# Patient Record
Sex: Female | Born: 1985 | Race: White | Hispanic: No | Marital: Married | Smoking: Never smoker
Health system: Southern US, Community
[De-identification: ages and names within clinical notes are randomized; demographics above are authoritative.]

## PROBLEM LIST (undated history)

## (undated) ENCOUNTER — Inpatient Hospital Stay (HOSPITAL_COMMUNITY): Payer: Self-pay

## (undated) DIAGNOSIS — F329 Major depressive disorder, single episode, unspecified: Secondary | ICD-10-CM

## (undated) DIAGNOSIS — Z87448 Personal history of other diseases of urinary system: Secondary | ICD-10-CM

## (undated) DIAGNOSIS — N201 Calculus of ureter: Secondary | ICD-10-CM

## (undated) DIAGNOSIS — N1370 Vesicoureteral-reflux, unspecified: Secondary | ICD-10-CM

## (undated) DIAGNOSIS — N3010 Interstitial cystitis (chronic) without hematuria: Secondary | ICD-10-CM

## (undated) DIAGNOSIS — R300 Dysuria: Secondary | ICD-10-CM

## (undated) DIAGNOSIS — E119 Type 2 diabetes mellitus without complications: Secondary | ICD-10-CM

## (undated) DIAGNOSIS — N3941 Urge incontinence: Secondary | ICD-10-CM

## (undated) DIAGNOSIS — R3915 Urgency of urination: Secondary | ICD-10-CM

## (undated) DIAGNOSIS — N3281 Overactive bladder: Secondary | ICD-10-CM

## (undated) DIAGNOSIS — G43709 Chronic migraine without aura, not intractable, without status migrainosus: Secondary | ICD-10-CM

## (undated) DIAGNOSIS — K5900 Constipation, unspecified: Secondary | ICD-10-CM

## (undated) DIAGNOSIS — K219 Gastro-esophageal reflux disease without esophagitis: Secondary | ICD-10-CM

## (undated) DIAGNOSIS — IMO0002 Reserved for concepts with insufficient information to code with codable children: Secondary | ICD-10-CM

## (undated) DIAGNOSIS — R7303 Prediabetes: Secondary | ICD-10-CM

## (undated) DIAGNOSIS — Z87440 Personal history of urinary (tract) infections: Secondary | ICD-10-CM

## (undated) DIAGNOSIS — T8859XA Other complications of anesthesia, initial encounter: Secondary | ICD-10-CM

## (undated) DIAGNOSIS — Z87442 Personal history of urinary calculi: Secondary | ICD-10-CM

## (undated) HISTORY — DX: Gastro-esophageal reflux disease without esophagitis: K21.9

## (undated) HISTORY — DX: Reserved for concepts with insufficient information to code with codable children: IMO0002

## (undated) HISTORY — DX: Vesicoureteral-reflux, unspecified: N13.70

## (undated) HISTORY — DX: Constipation, unspecified: K59.00

## (undated) HISTORY — DX: Dysuria: R30.0

## (undated) HISTORY — DX: Overactive bladder: N32.81

## (undated) HISTORY — DX: Interstitial cystitis (chronic) without hematuria: N30.10

## (undated) HISTORY — DX: Major depressive disorder, single episode, unspecified: F32.9

## (undated) HISTORY — DX: Chronic migraine without aura, not intractable, without status migrainosus: G43.709

---
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
## 2011-06-28 NOTE — L&D Delivery Note (Addendum)
Delivery Note At 12:09 AM a viable, healthy and SGA female was delivered via Vaginal, Vacuum (Extractor) (Presentation: Middle Occiput Anterior).  3 hour second stage.  APGAR: 7, 9; weight 6 lb 0.8 oz (2744 g).   Placenta status: Intact, Spontaneous.  Cord: 3 vessels.  Anesthesia: Epidural  Episiotomy: None Lacerations: 1st degree;Vaginal Suture Repair: 3.0 chromic Est. Blood Loss (mL): 400  Mom to postpartum.  Baby to nursery-stable.  Lauren Cardenas D 06/14/2012, 12:34 AM

## 2011-10-31 ENCOUNTER — Inpatient Hospital Stay (HOSPITAL_COMMUNITY)
Admission: AD | Admit: 2011-10-31 | Discharge: 2011-10-31 | Disposition: A | Discharge status: Home / Self Care | Payer: Medicaid Other | Source: Ambulatory Visit | Attending: Obstetrics & Gynecology | Admitting: Obstetrics & Gynecology

## 2011-10-31 ENCOUNTER — Encounter (HOSPITAL_COMMUNITY): Payer: Self-pay | Admitting: *Deleted

## 2011-10-31 DIAGNOSIS — R059 Cough, unspecified: Secondary | ICD-10-CM | POA: Insufficient documentation

## 2011-10-31 DIAGNOSIS — R05 Cough: Secondary | ICD-10-CM | POA: Insufficient documentation

## 2011-10-31 DIAGNOSIS — J069 Acute upper respiratory infection, unspecified: Secondary | ICD-10-CM | POA: Insufficient documentation

## 2011-10-31 DIAGNOSIS — O99891 Other specified diseases and conditions complicating pregnancy: Secondary | ICD-10-CM | POA: Insufficient documentation

## 2011-10-31 LAB — POCT PREGNANCY, URINE: Preg Test, Ur: POSITIVE — AB

## 2011-10-31 LAB — URINALYSIS, ROUTINE W REFLEX MICROSCOPIC
Bilirubin Urine: NEGATIVE
Glucose, UA: NEGATIVE mg/dL
Hgb urine dipstick: NEGATIVE
Ketones, ur: 40 mg/dL — AB
Leukocytes, UA: NEGATIVE
Nitrite: NEGATIVE
Protein, ur: NEGATIVE mg/dL
Specific Gravity, Urine: 1.02 (ref 1.005–1.030)
Urobilinogen, UA: 1 mg/dL (ref 0.0–1.0)
pH: 8.5 — ABNORMAL HIGH (ref 5.0–8.0)

## 2011-10-31 MED ORDER — AZITHROMYCIN 250 MG PO TABS: ORAL_TABLET | ORAL | Status: AC

## 2011-10-31 NOTE — MAU Note (Signed)
Patient states she has had tow positive home pregnancy tests. Started having URI symptoms on 5-3 with a cough, congestion and sore throat that is worse at night.

## 2011-10-31 NOTE — MAU Provider Note (Signed)
History     CSN: [id number]  Arrival date and time: 10/31/11 1103   None     Chief Complaint  Patient presents with  . Cough  . Nasal Congestion   HPI 26 y.o. G1P0 at about [redacted] weeks EGA, cough, runny nose, congestion x 3 day. No fever or chills.    History reviewed. No pertinent past medical history.  Past Surgical History  Procedure Date  . No past surgeries     Family History  Problem Relation Age of Onset  . Hypertension Mother   . Diabetes Father   . Kidney disease Father     History  Substance Use Topics  . Smoking status: Never Smoker   . Smokeless tobacco: Never Used  . Alcohol Use: No    Allergies: No Known Allergies  Prescriptions prior to admission  Medication Sig Dispense Refill  . Polyethyl Glycol-Propyl Glycol (SYSTANE) 0.4-0.3 % SOLN Apply 1 drop to eye daily as needed. For dryness      . Prenatal Vit-Fe Fumarate-FA (PRENATAL MULTIVITAMIN) TABS Take 1 tablet by mouth at bedtime.        Review of Systems  Constitutional: Negative.  Negative for fever.  HENT: Positive for congestion and sore throat.   Respiratory: Positive for cough.   Cardiovascular: Negative.   Gastrointestinal: Negative for nausea, vomiting, abdominal pain, diarrhea and constipation.  Genitourinary: Negative for dysuria, urgency, frequency, hematuria and flank pain.       Negative for vaginal bleeding, vaginal discharge, dyspareunia  Musculoskeletal: Negative.   Neurological: Positive for headaches.  Psychiatric/Behavioral: Negative.    Physical Exam   Blood pressure 138/87, pulse 103, temperature 99.2 F (37.3 C), temperature source Oral, resp. rate 18, height 5' 1.5" (1.562 m), weight 168 lb 9.6 oz (76.476 kg), last menstrual period 09/09/2011, SpO2 100.00%.  Physical Exam  Nursing note and vitals reviewed. Constitutional: She is oriented to person, place, and time. She appears well-developed and well-nourished. No distress.  Cardiovascular: Normal rate, regular  rhythm and normal heart sounds.   Respiratory: Effort normal and breath sounds normal. No respiratory distress. She has no wheezes. She has no rales.  GI: Soft. There is no tenderness.  Musculoskeletal: Normal range of motion.  Neurological: She is alert and oriented to person, place, and time.  Skin: Skin is warm and dry.  Psychiatric: She has a normal mood and affect.    MAU Course  Procedures Results for orders placed during the hospital encounter of 10/31/11 (from the past 24 hour(s))  URINALYSIS, ROUTINE W REFLEX MICROSCOPIC     Status: Abnormal   Collection Time   10/31/11 11:30 AM      Component Value Range   Color, Urine YELLOW  YELLOW    APPearance HAZY (*) CLEAR    Specific Gravity, Urine 1.020  1.005 - 1.030    pH 8.5 (*) 5.0 - 8.0    Glucose, UA NEGATIVE  NEGATIVE (mg/dL)   Hgb urine dipstick NEGATIVE  NEGATIVE    Bilirubin Urine NEGATIVE  NEGATIVE    Ketones, ur 40 (*) NEGATIVE (mg/dL)   Protein, ur NEGATIVE  NEGATIVE (mg/dL)   Urobilinogen, UA 1.0  0.0 - 1.0 (mg/dL)   Nitrite NEGATIVE  NEGATIVE    Leukocytes, UA NEGATIVE  NEGATIVE   POCT PREGNANCY, URINE     Status: Abnormal   Collection Time   10/31/11 11:38 AM      Component Value Range   Preg Test, Ur POSITIVE (*) NEGATIVE  Assessment and Plan  26 y.o. G1P0 at 7.[redacted] weeks EGA URI - rev'd OTC meds that are safe in pregnancy, gave rx for Zpak - recommended to start if symptoms do not improve in 7-10 days Pregnancy verification given - start prenatal care as soon as possible  Loriene Taunton 10/31/2011, 11:59 AM

## 2011-10-31 NOTE — MAU Note (Signed)
Patient wants pregnancy verification. Has upper respiratory sxs.

## 2011-12-06 LAB — OB RESULTS CONSOLE GC/CHLAMYDIA
Chlamydia: NEGATIVE
Gonorrhea: NEGATIVE

## 2011-12-06 LAB — OB RESULTS CONSOLE ANTIBODY SCREEN: Antibody Screen: NEGATIVE

## 2012-03-20 ENCOUNTER — Inpatient Hospital Stay (HOSPITAL_COMMUNITY)
Admission: AD | Admit: 2012-03-20 | Discharge: 2012-03-20 | Disposition: A | Discharge status: Home / Self Care | Payer: Medicaid Other | Source: Ambulatory Visit | Attending: Obstetrics and Gynecology | Admitting: Obstetrics and Gynecology

## 2012-03-20 ENCOUNTER — Encounter (HOSPITAL_COMMUNITY): Payer: Self-pay | Admitting: *Deleted

## 2012-03-20 DIAGNOSIS — R55 Syncope and collapse: Secondary | ICD-10-CM | POA: Diagnosis present

## 2012-03-20 DIAGNOSIS — O2650 Maternal hypotension syndrome, unspecified trimester: Secondary | ICD-10-CM | POA: Insufficient documentation

## 2012-03-20 LAB — URINALYSIS, ROUTINE W REFLEX MICROSCOPIC
Glucose, UA: NEGATIVE mg/dL
Hgb urine dipstick: NEGATIVE
Ketones, ur: NEGATIVE mg/dL
Protein, ur: 30 mg/dL — AB

## 2012-03-20 LAB — URINE MICROSCOPIC-ADD ON

## 2012-03-20 NOTE — MAU Note (Signed)
Pt G1 at 27.4wks "passed out" about ago while in the child birth class.  Pt did not fall she was sitting in a chair.  Reports no hx of diabetes or blood pressure issues.  Last ate at 1700.

## 2012-03-20 NOTE — Progress Notes (Signed)
[name] CNM in to discuss d/c plan with pt. Written and verbal d/c instructions given and understanding voiced.

## 2012-03-20 NOTE — Progress Notes (Signed)
OK to d/c efm per Lisa Leftwich-Kirby CNM 

## 2012-03-20 NOTE — MAU Provider Note (Signed)
Chief Complaint:  Loss of Consciousness   First Provider Initiated Contact with Patient 03/20/12 2156      HPI: Lauren Cardenas is a 26 y.o. G1P0 at 50w4dwho presents to maternity admissions reporting single episode of syncope while sitting in a chair at childbirth class tonight.  She did not fall and was easily supported in chair, and quickly regained consciousness.  She has had syncopal episodes due to hypoglycemia prior to pregnancy.  The childbirth class was warm and she reports drinking a very small amount of fluids today.  She reports good fetal movement, denies contractions, LOF, vaginal bleeding, vaginal itching/burning, urinary symptoms, h/a, dizziness, n/v, or fever/chills.     Past Medical History: Past Medical History  Diagnosis Date  . Syncope 03/20/12    Past obstetric history:   Past Surgical History: Past Surgical History  Procedure Date  . No past surgeries     Family History: Family History  Problem Relation Age of Onset  . Hypertension Mother   . Diabetes Father   . Kidney disease Father     Social History: History  Substance Use Topics  . Smoking status: Never Smoker   . Smokeless tobacco: Never Used  . Alcohol Use: No    Allergies: No Known Allergies  Meds:  Prescriptions prior to admission  Medication Sig Dispense Refill  . folic acid (FOLVITE) 400 MCG tablet Take 400 mcg by mouth daily with supper.      Bertram Gala Glycol-Propyl Glycol (SYSTANE) 0.4-0.3 % SOLN Apply 1 drop to eye daily as needed. For dry eyes      . Prenatal Vit-Fe Fumarate-FA (PRENATAL MULTIVITAMIN) TABS Take 1 tablet by mouth at bedtime.        ROS: Pertinent findings in history of present illness.  Physical Exam  Blood pressure 121/71, pulse 94, temperature 99.3 F (37.4 C), temperature source Oral, resp. rate 20, height 5\' 2"  (1.575 m), weight 77.293 kg (170 lb 6.4 oz), last menstrual period 09/09/2011, SpO2 99.00%. GENERAL: Well-developed, well-nourished female in no  acute distress.  HEENT: normocephalic HEART: normal rate RESP: normal effort ABDOMEN: Soft, non-tender, gravid appropriate for gestational age EXTREMITIES: Nontender, no edema NEURO: alert and oriented     FHT:  Baseline 145 , moderate variability, accelerations present, no decelerations Contractions: None   Labs: Results for orders placed during the hospital encounter of 03/20/12 (from the past 24 hour(s))  URINALYSIS, ROUTINE W REFLEX MICROSCOPIC     Status: Abnormal   Collection Time   03/20/12  9:24 PM      Component Value Range   Color, Urine YELLOW  YELLOW   APPearance CLEAR  CLEAR   Specific Gravity, Urine 1.025  1.005 - 1.030   pH 6.5  5.0 - 8.0   Glucose, UA NEGATIVE  NEGATIVE mg/dL   Hgb urine dipstick NEGATIVE  NEGATIVE   Bilirubin Urine NEGATIVE  NEGATIVE   Ketones, ur NEGATIVE  NEGATIVE mg/dL   Protein, ur 30 (*) NEGATIVE mg/dL   Urobilinogen, UA 1.0  0.0 - 1.0 mg/dL   Nitrite NEGATIVE  NEGATIVE   Leukocytes, UA NEGATIVE  NEGATIVE  URINE MICROSCOPIC-ADD ON     Status: Abnormal   Collection Time   03/20/12  9:24 PM      Component Value Range   Squamous Epithelial / LPF FEW (*) RARE   WBC, UA 0-2  <3 WBC/hpf   Bacteria, UA FEW (*) RARE   Crystals CA OXALATE CRYSTALS (*) NEGATIVE   Urine-Other MUCOUS PRESENT  GLUCOSE, CAPILLARY     Status: Normal   Collection Time   03/20/12 10:44 PM      Component Value Range   Glucose-Capillary 93  70 - 99 mg/dL     Assessment: 1. Syncope     Plan: Called Dr Dareen Piano to discuss assessment and findings Discharge home Drink plenty of fluids F/U as scheduled on Tuesday with Dr Dareen Piano Return to MAU as needed  Follow-up Information    Follow up with HORVATH,MICHELLE A, MD. (Return to MAU as needed.)    Contact information:   [address] [place] [address] [phone]           Medication List     As of 03/20/2012 11:06 PM    TAKE these medications         folic acid 400 MCG  tablet   Commonly known as: FOLVITE   Take 400 mcg by mouth daily with supper.      prenatal multivitamin Tabs   Take 1 tablet by mouth at bedtime.      SYSTANE 0.4-0.3 % Soln   Generic drug: Polyethyl Glycol-Propyl Glycol   Apply 1 drop to eye daily as needed. For dry eyes        Sharen Counter Certified Nurse-Midwife 03/20/2012 11:06 PM

## 2012-05-15 LAB — OB RESULTS CONSOLE GBS: GBS: NEGATIVE

## 2012-06-12 ENCOUNTER — Inpatient Hospital Stay (HOSPITAL_COMMUNITY): Payer: Medicaid Other

## 2012-06-12 ENCOUNTER — Encounter (HOSPITAL_COMMUNITY): Payer: Self-pay | Admitting: *Deleted

## 2012-06-12 ENCOUNTER — Inpatient Hospital Stay (HOSPITAL_COMMUNITY)
Admission: AD | Admit: 2012-06-12 | Discharge: 2012-06-16 | DRG: 774 | Disposition: A | Discharge status: Home / Self Care | Payer: Medicaid Other | Source: Ambulatory Visit | Attending: Obstetrics and Gynecology | Admitting: Obstetrics and Gynecology

## 2012-06-12 DIAGNOSIS — IMO0002 Reserved for concepts with insufficient information to code with codable children: Principal | ICD-10-CM | POA: Diagnosis not present

## 2012-06-12 DIAGNOSIS — Z3480 Encounter for supervision of other normal pregnancy, unspecified trimester: Secondary | ICD-10-CM

## 2012-06-12 DIAGNOSIS — R55 Syncope and collapse: Secondary | ICD-10-CM

## 2012-06-12 LAB — CBC
HCT: 38.7 % (ref 36.0–46.0)
Hemoglobin: 12.8 g/dL (ref 12.0–15.0)
RBC: 4.67 MIL/uL (ref 3.87–5.11)

## 2012-06-12 MED ORDER — ONDANSETRON HCL 4 MG/2ML IJ SOLN
4.0000 mg | Freq: Four times a day (QID) | INTRAMUSCULAR | Status: DC | PRN
  Administered 2012-06-13: 4 mg via INTRAVENOUS
  Filled 2012-06-12: qty 2

## 2012-06-12 MED ORDER — ACETAMINOPHEN 325 MG PO TABS
650.0000 mg | ORAL_TABLET | ORAL | Status: DC | PRN
  Administered 2012-06-13: 325 mg via ORAL
  Filled 2012-06-12: qty 2

## 2012-06-12 MED ORDER — OXYCODONE-ACETAMINOPHEN 5-325 MG PO TABS: 1.0000 | ORAL_TABLET | ORAL | Status: DC | PRN

## 2012-06-12 MED ORDER — LIDOCAINE HCL (PF) 1 % IJ SOLN
30.0000 mL | INTRAMUSCULAR | Status: DC | PRN
  Filled 2012-06-12: qty 30

## 2012-06-12 MED ORDER — OXYTOCIN 40 UNITS IN LACTATED RINGERS INFUSION - SIMPLE MED
62.5000 mL/h | INTRAVENOUS | Status: DC
  Filled 2012-06-12 (×2): qty 1000

## 2012-06-12 MED ORDER — IBUPROFEN 600 MG PO TABS: 600.0000 mg | ORAL_TABLET | Freq: Four times a day (QID) | ORAL | Status: DC | PRN

## 2012-06-12 MED ORDER — OXYTOCIN BOLUS FROM INFUSION: 500.0000 mL | INTRAVENOUS | Status: DC

## 2012-06-12 MED ORDER — LACTATED RINGERS IV SOLN: 500.0000 mL | INTRAVENOUS | Status: DC | PRN

## 2012-06-12 MED ORDER — FLEET ENEMA 7-19 GM/118ML RE ENEM: 1.0000 | ENEMA | Freq: Every day | RECTAL | Status: DC | PRN

## 2012-06-12 MED ORDER — LACTATED RINGERS IV SOLN
INTRAVENOUS | Status: DC
  Administered 2012-06-13 (×4): via INTRAVENOUS

## 2012-06-12 MED ORDER — CITRIC ACID-SODIUM CITRATE 334-500 MG/5ML PO SOLN: 30.0000 mL | ORAL | Status: DC | PRN

## 2012-06-12 NOTE — Progress Notes (Signed)
Dr Dareen Piano called for admit orders. Informed of indeterminent baseline with minimal variability for FHR. Order to monitor for 30 minutes then call back for orders.

## 2012-06-12 NOTE — Progress Notes (Addendum)
FHR still indeterminent. Wants BPP. If 8/8 then start Cytotec if not 8/8 then call him back. Pt informed of plan

## 2012-06-13 ENCOUNTER — Encounter (HOSPITAL_COMMUNITY): Payer: Self-pay | Admitting: Anesthesiology

## 2012-06-13 ENCOUNTER — Inpatient Hospital Stay (HOSPITAL_COMMUNITY): Payer: Medicaid Other | Admitting: Anesthesiology

## 2012-06-13 LAB — CBC
HCT: 38.6 % (ref 36.0–46.0)
Hemoglobin: 12.8 g/dL (ref 12.0–15.0)
RBC: 4.68 MIL/uL (ref 3.87–5.11)

## 2012-06-13 LAB — RPR: RPR Ser Ql: NONREACTIVE

## 2012-06-13 MED ORDER — MISOPROSTOL 25 MCG QUARTER TABLET
25.0000 ug | ORAL_TABLET | ORAL | Status: DC | PRN
  Administered 2012-06-13 (×2): 25 ug via VAGINAL
  Filled 2012-06-13 (×2): qty 0.25

## 2012-06-13 MED ORDER — TERBUTALINE SULFATE 1 MG/ML IJ SOLN: 0.2500 mg | Freq: Once | INTRAMUSCULAR | Status: AC | PRN

## 2012-06-13 MED ORDER — PHENYLEPHRINE 40 MCG/ML (10ML) SYRINGE FOR IV PUSH (FOR BLOOD PRESSURE SUPPORT): 80.0000 ug | PREFILLED_SYRINGE | INTRAVENOUS | Status: DC | PRN

## 2012-06-13 MED ORDER — LACTATED RINGERS IV SOLN
500.0000 mL | Freq: Once | INTRAVENOUS | Status: AC
  Administered 2012-06-13: 15:00:00 via INTRAVENOUS

## 2012-06-13 MED ORDER — DIPHENHYDRAMINE HCL 50 MG/ML IJ SOLN: 12.5000 mg | INTRAMUSCULAR | Status: DC | PRN

## 2012-06-13 MED ORDER — EPHEDRINE 5 MG/ML INJ
INTRAVENOUS | Status: AC
  Filled 2012-06-13: qty 4

## 2012-06-13 MED ORDER — EPHEDRINE 5 MG/ML INJ: 10.0000 mg | INTRAVENOUS | Status: DC | PRN

## 2012-06-13 MED ORDER — FENTANYL 2.5 MCG/ML BUPIVACAINE 1/10 % EPIDURAL INFUSION (WH - ANES)
INTRAMUSCULAR | Status: DC | PRN
  Administered 2012-06-13: 14 mL/h via EPIDURAL

## 2012-06-13 MED ORDER — LIDOCAINE HCL (PF) 1 % IJ SOLN
INTRAMUSCULAR | Status: DC | PRN
  Administered 2012-06-13 (×2): 4 mL

## 2012-06-13 MED ORDER — OXYTOCIN 40 UNITS IN LACTATED RINGERS INFUSION - SIMPLE MED
1.0000 m[IU]/min | INTRAVENOUS | Status: DC
  Administered 2012-06-13: 2 m[IU]/min via INTRAVENOUS

## 2012-06-13 MED ORDER — FENTANYL 2.5 MCG/ML BUPIVACAINE 1/10 % EPIDURAL INFUSION (WH - ANES)
INTRAMUSCULAR | Status: AC
  Filled 2012-06-13: qty 125

## 2012-06-13 MED ORDER — FENTANYL 2.5 MCG/ML BUPIVACAINE 1/10 % EPIDURAL INFUSION (WH - ANES)
14.0000 mL/h | INTRAMUSCULAR | Status: DC
  Administered 2012-06-13: 14 mL/h via EPIDURAL
  Filled 2012-06-13: qty 125

## 2012-06-13 MED ORDER — PHENYLEPHRINE 40 MCG/ML (10ML) SYRINGE FOR IV PUSH (FOR BLOOD PRESSURE SUPPORT)
PREFILLED_SYRINGE | INTRAVENOUS | Status: AC
  Filled 2012-06-13: qty 5

## 2012-06-13 NOTE — Anesthesia Procedure Notes (Signed)
Epidural Patient location during procedure: OB Start time: 06/13/2012 2:43 PM  Staffing Anesthesiologist: Macala Baldonado A. Performed by: anesthesiologist   Preanesthetic Checklist Completed: patient identified, site marked, surgical consent, pre-op evaluation, timeout performed, IV checked, risks and benefits discussed and monitors and equipment checked  Epidural Patient position: sitting Prep: site prepped and draped and DuraPrep Patient monitoring: continuous pulse ox and blood pressure Approach: midline Injection technique: LOR air  Needle:  Needle type: Tuohy  Needle gauge: 17 G Needle length: 9 cm and 9 Needle insertion depth: 6 cm Catheter type: closed end flexible Catheter size: 19 Gauge Catheter at skin depth: 11 cm Test dose: negative and Other  Assessment Events: blood not aspirated, injection not painful, no injection resistance, negative IV test and no paresthesia  Additional Notes Patient identified. Risks and benefits discussed including failed block, incomplete  Pain control, post dural puncture headache, nerve damage, paralysis, blood pressure Changes, nausea, vomiting, reactions to medications-both toxic and allergic and post Partum back pain. All questions were answered. Patient expressed understanding and wished to proceed. Sterile technique was used throughout procedure. Epidural site was Dressed with sterile barrier dressing. No paresthesias, signs of intravascular injection Or signs of intrathecal spread were encountered.  Patient was more comfortable after the epidural was dosed. Please see RN's note for documentation of vital signs and FHR which are stable.

## 2012-06-13 NOTE — Progress Notes (Signed)
Provider reviewed strip, may restart Pitocin at 3 mu/min.

## 2012-06-13 NOTE — H&P (Signed)
Pt is a 26 year old white female, G1P0 at term who presents for induction secondary to Preeclampsia. Pt was seen this week and her B/P was elevated and she had 1+ proteinuria. Her pregnancy was otherwise uncomplicated. PMHx: see [facility] PE:[id number] HEENT- wnl ABD-gravid, non tender EXTs- wnl IMP/ IUP at term with Preeclampsia PLAN/ start induction

## 2012-06-13 NOTE — Anesthesia Preprocedure Evaluation (Signed)
Anesthesia Evaluation  Patient identified by MRN, date of birth, ID band Patient awake    Reviewed: Allergy & Precautions, H&P , Patient's Chart, lab work & pertinent test results  Airway Mallampati: III TM Distance: >3 FB Neck ROM: full    Dental No notable dental hx. (+) Teeth Intact   Pulmonary neg pulmonary ROS,  breath sounds clear to auscultation  Pulmonary exam normal       Cardiovascular negative cardio ROS  Rhythm:regular Rate:Normal     Neuro/Psych negative neurological ROS  negative psych ROS   GI/Hepatic Neg liver ROS, GERD-  Medicated and Controlled,  Endo/Other  negative endocrine ROS  Renal/GU negative Renal ROS  negative genitourinary   Musculoskeletal   Abdominal Normal abdominal exam  (+)   Peds  Hematology negative hematology ROS (+)   Anesthesia Other Findings   Reproductive/Obstetrics (+) Pregnancy                           Anesthesia Physical Anesthesia Plan  ASA: II  Anesthesia Plan: Epidural   Post-op Pain Management:    Induction:   Airway Management Planned:   Additional Equipment:   Intra-op Plan:   Post-operative Plan:   Informed Consent: I have reviewed the patients History and Physical, chart, labs and discussed the procedure including the risks, benefits and alternatives for the proposed anesthesia with the patient or authorized representative who has indicated his/her understanding and acceptance.     Plan Discussed with: Anesthesiologist  Anesthesia Plan Comments:         Anesthesia Quick Evaluation

## 2012-06-14 ENCOUNTER — Encounter (HOSPITAL_COMMUNITY): Payer: Self-pay | Admitting: *Deleted

## 2012-06-14 LAB — CBC
HCT: 34.4 % — ABNORMAL LOW (ref 36.0–46.0)
RDW: 13.5 % (ref 11.5–15.5)
WBC: 20.9 10*3/uL — ABNORMAL HIGH (ref 4.0–10.5)

## 2012-06-14 LAB — TYPE AND SCREEN
ABO/RH(D): O POS
Antibody Screen: NEGATIVE

## 2012-06-14 MED ORDER — DIBUCAINE 1 % RE OINT: 1.0000 "application " | TOPICAL_OINTMENT | RECTAL | Status: DC | PRN

## 2012-06-14 MED ORDER — OXYCODONE-ACETAMINOPHEN 5-325 MG PO TABS
1.0000 | ORAL_TABLET | ORAL | Status: DC | PRN
  Administered 2012-06-14 – 2012-06-16 (×3): 1 via ORAL
  Filled 2012-06-14 (×4): qty 1

## 2012-06-14 MED ORDER — BENZOCAINE-MENTHOL 20-0.5 % EX AERO
1.0000 "application " | INHALATION_SPRAY | CUTANEOUS | Status: DC | PRN
  Filled 2012-06-14: qty 56

## 2012-06-14 MED ORDER — ZOLPIDEM TARTRATE 5 MG PO TABS: 5.0000 mg | ORAL_TABLET | Freq: Every evening | ORAL | Status: DC | PRN

## 2012-06-14 MED ORDER — WITCH HAZEL-GLYCERIN EX PADS: 1.0000 "application " | MEDICATED_PAD | CUTANEOUS | Status: DC | PRN

## 2012-06-14 MED ORDER — SIMETHICONE 80 MG PO CHEW
80.0000 mg | CHEWABLE_TABLET | ORAL | Status: DC | PRN
  Administered 2012-06-16: 80 mg via ORAL

## 2012-06-14 MED ORDER — IBUPROFEN 600 MG PO TABS
600.0000 mg | ORAL_TABLET | Freq: Four times a day (QID) | ORAL | Status: DC
  Administered 2012-06-14 – 2012-06-16 (×10): 600 mg via ORAL
  Filled 2012-06-14 (×10): qty 1

## 2012-06-14 MED ORDER — LANOLIN HYDROUS EX OINT: TOPICAL_OINTMENT | CUTANEOUS | Status: DC | PRN

## 2012-06-14 MED ORDER — TETANUS-DIPHTH-ACELL PERTUSSIS 5-2.5-18.5 LF-MCG/0.5 IM SUSP
0.5000 mL | Freq: Once | INTRAMUSCULAR | Status: AC
  Administered 2012-06-16: 0.5 mL via INTRAMUSCULAR
  Filled 2012-06-14: qty 0.5

## 2012-06-14 MED ORDER — SENNOSIDES-DOCUSATE SODIUM 8.6-50 MG PO TABS
2.0000 | ORAL_TABLET | Freq: Every day | ORAL | Status: DC
  Administered 2012-06-14 – 2012-06-15 (×2): 2 via ORAL

## 2012-06-14 MED ORDER — DIPHENHYDRAMINE HCL 25 MG PO CAPS: 25.0000 mg | ORAL_CAPSULE | Freq: Four times a day (QID) | ORAL | Status: DC | PRN

## 2012-06-14 MED ORDER — ONDANSETRON HCL 4 MG PO TABS: 4.0000 mg | ORAL_TABLET | ORAL | Status: DC | PRN

## 2012-06-14 MED ORDER — ONDANSETRON HCL 4 MG/2ML IJ SOLN: 4.0000 mg | INTRAMUSCULAR | Status: DC | PRN

## 2012-06-14 MED ORDER — PRENATAL MULTIVITAMIN CH
1.0000 | ORAL_TABLET | Freq: Every day | ORAL | Status: DC
  Administered 2012-06-14 – 2012-06-16 (×3): 1 via ORAL
  Filled 2012-06-14 (×3): qty 1

## 2012-06-14 MED ORDER — INFLUENZA VIRUS VACC SPLIT PF IM SUSP
0.5000 mL | INTRAMUSCULAR | Status: AC
  Administered 2012-06-14: 0.5 mL via INTRAMUSCULAR
  Filled 2012-06-14: qty 0.5

## 2012-06-14 NOTE — Anesthesia Postprocedure Evaluation (Signed)
  Anesthesia Post-op Note  Patient: Lauren Cardenas  Procedure(s) Performed: * No procedures listed *  Patient Location: Mother/Baby  Anesthesia Type:Epidural  Level of Consciousness: awake, alert  and oriented  Airway and Oxygen Therapy: Patient Spontanous Breathing  Post-op Pain: mild  Post-op Assessment: Post-op Vital signs reviewed, Patient's Cardiovascular Status Stable, No headache, No backache, No residual numbness and No residual motor weakness  Post-op Vital Signs: Reviewed and stable  Complications: No apparent anesthesia complications

## 2012-06-14 NOTE — Progress Notes (Signed)
Patient is eating, ambulating, voiding.  Pain control is good.  Filed Vitals:   06/14/12 0116 06/14/12 0220 06/14/12 0320 06/14/12 0720  BP: 126/66 117/50 129/78 122/75  Pulse: 98 88 86 90  Temp:  99.3 F (37.4 C) 98.5 F (36.9 C) 98.9 F (37.2 C)  TempSrc:  Oral Oral Oral  Resp: 20 20 20 20   Height:      Weight:      SpO2:  99% 97% 98%    Fundus firm Perineum without swelling.  Lab Results  Component Value Date   WBC 20.9* 06/14/2012   HGB 11.2* 06/14/2012   HCT 34.4* 06/14/2012   MCV 82.9 06/14/2012   PLT 139* 06/14/2012    --/--/O POS, O POS (12/17 2200)/RI  A/P Post partum day 0.  Routine care.  Expect d/c 1-2 days.    Talaysia Pinheiro A

## 2012-06-15 NOTE — Progress Notes (Signed)
PPD#1 Pt without complaints. VSSAF IMP/ doing well  Plan/ routine care. 

## 2012-06-16 MED ORDER — IBUPROFEN 600 MG PO TABS: 600.0000 mg | ORAL_TABLET | Freq: Four times a day (QID) | ORAL | Status: DC

## 2012-06-16 NOTE — Discharge Summary (Signed)
Obstetric Discharge Summary Reason for Admission: induction of labor Prenatal Procedures: ultrasound Intrapartum Procedures: vacuum Postpartum Procedures: none Complications-Operative and Postpartum: 1 degree perineal laceration Hemoglobin  Date Value Range Status  06/14/2012 11.2* 12.0 - 15.0 g/dL Final     HCT  Date Value Range Status  06/14/2012 34.4* 36.0 - 46.0 % Final    Physical Exam:  General: alert Lochia: appropriate Uterine Fundus: firm }  Discharge Diagnoses: Term Pregnancy-delivered and Preelampsia  Discharge Information: Date: 06/16/2012 Activity: pelvic rest Diet: routine Medications: PNV and Ibuprofen Condition: stable Instructions: refer to practice specific booklet Discharge to: home Follow-up Information    Follow up with Mickel Baas, MD. Schedule an appointment as soon as possible for a visit in 4 weeks.   Contact information:   [address] [place] [address] [phone]          Newborn Data: Live born female  Birth Weight: 6 lb 0.8 oz (2744 g) APGAR: 7, 9  Home with mother.  Lauren Cardenas 06/16/2012, 4:14 PM

## 2012-06-21 ENCOUNTER — Telehealth (HOSPITAL_COMMUNITY): Payer: Self-pay | Admitting: *Deleted

## 2012-06-21 NOTE — Telephone Encounter (Signed)
Resolve episode 

## 2012-11-13 ENCOUNTER — Encounter: Payer: Self-pay | Admitting: Cardiovascular Disease

## 2012-11-13 ENCOUNTER — Telehealth: Payer: Self-pay | Admitting: Cardiovascular Disease

## 2012-11-13 NOTE — Telephone Encounter (Signed)
Error - wrong pt

## 2012-11-13 NOTE — Telephone Encounter (Signed)
error 

## 2013-05-24 ENCOUNTER — Ambulatory Visit: Payer: Self-pay | Admitting: General Practice

## 2013-05-24 ENCOUNTER — Ambulatory Visit (INDEPENDENT_AMBULATORY_CARE_PROVIDER_SITE_OTHER): Payer: Medicaid Other | Admitting: General Practice

## 2013-05-24 ENCOUNTER — Encounter: Payer: Self-pay | Admitting: General Practice

## 2013-05-24 VITALS — BP 131/90 | HR 91 | Temp 100.7°F | Ht 62.0 in | Wt 182.0 lb

## 2013-05-24 DIAGNOSIS — R509 Fever, unspecified: Secondary | ICD-10-CM

## 2013-05-24 DIAGNOSIS — J069 Acute upper respiratory infection, unspecified: Secondary | ICD-10-CM

## 2013-05-24 LAB — POCT INFLUENZA A/B
Influenza A, POC: NEGATIVE
Influenza B, POC: NEGATIVE

## 2013-05-24 LAB — POCT RAPID STREP A (OFFICE): Rapid Strep A Screen: NEGATIVE

## 2013-05-24 MED ORDER — AMOXICILLIN 500 MG PO CAPS: 500.0000 mg | ORAL_CAPSULE | Freq: Two times a day (BID) | ORAL | Status: DC

## 2013-05-24 NOTE — Progress Notes (Signed)
   Subjective:    Patient ID: Lauren Cardenas, female    DOB: 02-17-1986, 27 y.o.   MRN: [id number]  Sore Throat  This is a new problem. The current episode started in the past 7 days. The problem has been gradually worsening. Neither side of throat is experiencing more pain than the other. The maximum temperature recorded prior to her arrival was 100 - 100.9 F. The fever has been present for 1 to 2 days. The pain is at a severity of 5/10. Associated symptoms include congestion and a plugged ear sensation. Pertinent negatives include no abdominal pain, headaches, shortness of breath or vomiting. She has had no exposure to strep or mono. She has tried cool liquids for the symptoms. The treatment provided no relief.      Review of Systems  Constitutional: Negative for fever and chills.  HENT: Positive for congestion, postnasal drip, sinus pressure and sore throat.   Respiratory: Positive for wheezing. Negative for chest tightness and shortness of breath.   Cardiovascular: Negative for chest pain and palpitations.  Gastrointestinal: Positive for nausea. Negative for vomiting and abdominal pain.  Genitourinary: Negative for difficulty urinating.  Neurological: Negative for dizziness, weakness and headaches.       Objective:   Physical Exam  Constitutional: She is oriented to person, place, and time. She appears well-developed and well-nourished.  HENT:  Head: Normocephalic and atraumatic.  Right Ear: External ear normal.  Left Ear: External ear normal.  Mouth/Throat: Posterior oropharyngeal erythema present.  Eyes: Pupils are equal, round, and reactive to light.  Neck: Normal range of motion. Neck supple. No thyromegaly present.  Cardiovascular: Normal rate, regular rhythm and normal heart sounds.   Pulmonary/Chest: Effort normal and breath sounds normal. No respiratory distress. She exhibits no tenderness.  Abdominal: Soft. Bowel sounds are normal. She exhibits no distension. There is  no tenderness.  Lymphadenopathy:    She has no cervical adenopathy.  Neurological: She is alert and oriented to person, place, and time.  Skin: Skin is warm and dry.  Psychiatric: She has a normal mood and affect.   Results for orders placed in visit on 05/24/13  POCT INFLUENZA A/B      Result Value Range   Influenza A, POC Negative     Influenza B, POC Negative    POCT RAPID STREP A (OFFICE)      Result Value Range   Rapid Strep A Screen Negative  Negative         Assessment & Plan:  1. Fever, unspecified  - POCT Influenza A/B - POCT rapid strep A  2. Upper respiratory infection  - amoxicillin (AMOXIL) 500 MG capsule; Take 1 capsule (500 mg total) by mouth 2 (two) times daily.  Dispense: 20 capsule; Refill: 0 -Increase fluid intake Motrin or tylenol OTC OTC decongestant Proper handwashing Patient verbalized understanding Coralie Keens, FNP-C

## 2013-05-24 NOTE — Patient Instructions (Signed)

## 2013-06-03 ENCOUNTER — Ambulatory Visit (INDEPENDENT_AMBULATORY_CARE_PROVIDER_SITE_OTHER): Payer: Medicaid Other | Admitting: Family Medicine

## 2013-06-03 ENCOUNTER — Encounter: Payer: Self-pay | Admitting: Family Medicine

## 2013-06-03 VITALS — BP 131/90 | HR 101 | Temp 99.2°F | Ht 62.0 in | Wt 183.0 lb

## 2013-06-03 DIAGNOSIS — L282 Other prurigo: Secondary | ICD-10-CM

## 2013-06-03 DIAGNOSIS — T7840XA Allergy, unspecified, initial encounter: Secondary | ICD-10-CM

## 2013-06-03 MED ORDER — PREDNISONE 50 MG PO TABS: ORAL_TABLET | ORAL | Status: DC

## 2013-06-03 MED ORDER — SODIUM CHLORIDE 0.9 % IV SOLN: 125.0000 mg | Freq: Once | INTRAVENOUS | Status: DC

## 2013-06-03 NOTE — Progress Notes (Signed)
   Subjective:    Patient ID: Lauren Cardenas, female    DOB: April 08, 1986, 27 y.o.   MRN: [id number]  HPI HPI  This patient complains of a RASH  Location: generalized, mainly upper body   Onset: 2-3 days   Course: initially noticed mild pruritic rash on upper extremities bilaterally. Gradually progressed to involve chest, neck, back. Recently was on course of amox for URI (rash and abx overlapped by approx 2 days)  Self-treated with: nothing   Improvement with treatment: nothing   History  Itching: yes  Tenderness: no  New medications/antibiotics: no  Pet exposure: no  Recent travel or tropical exposure: no  New soaps, shampoos, detergent, clothing: no  Tick/insect exposure: no  Chemical Exposure: no  Red Flags  Feeling ill: no  Fever: no  Facial/tongue swelling/difficulty breathing: no  Diabetic or immunocompromised: no     Review of Systems  All other systems reviewed and are negative.       Objective:   Physical Exam  Constitutional: She appears well-developed and well-nourished.  HENT:  Head: Normocephalic and atraumatic.  Eyes: Conjunctivae are normal. Pupils are equal, round, and reactive to light.  Neck: Normal range of motion. Neck supple.  Cardiovascular: Normal rate and regular rhythm.   Pulmonary/Chest: Effort normal and breath sounds normal.  Abdominal: Soft.  Neurological: She is alert.  Skin: Rash noted.  Faint papular, mildly erythematous rash diffusely across chest, neck, back and arms.  Nontender          Assessment & Plan:  Pruritic rash - Plan: methylPREDNISolone sodium succinate (SOLU-MEDROL) 130 mg in sodium chloride 0.9 % 50 mL IVPB, predniSONE (DELTASONE) 50 MG tablet  Allergic reaction, initial encounter - Plan: methylPREDNISolone sodium succinate (SOLU-MEDROL) 130 mg in sodium chloride 0.9 % 50 mL IVPB, predniSONE (DELTASONE) 50 MG tablet  Suspect mild allergic reaction to amox (fairly common offending agent) vs. Post viral exanthem  (lower on ddx).  Will treat with solumedrol 125mg  IM x1 Prn prednisone at home.  Discussed general and derm red flags.  Will add amox to allergy list.  Follow up as needed.

## 2013-06-04 ENCOUNTER — Encounter: Payer: Self-pay | Admitting: Nurse Practitioner

## 2013-06-04 ENCOUNTER — Ambulatory Visit (INDEPENDENT_AMBULATORY_CARE_PROVIDER_SITE_OTHER): Payer: Medicaid Other | Admitting: Nurse Practitioner

## 2013-06-04 ENCOUNTER — Other Ambulatory Visit: Payer: Medicaid Other

## 2013-06-04 ENCOUNTER — Ambulatory Visit (INDEPENDENT_AMBULATORY_CARE_PROVIDER_SITE_OTHER): Payer: Medicaid Other

## 2013-06-04 VITALS — BP 153/84 | HR 109 | Temp 98.4°F | Ht 62.0 in | Wt 183.0 lb

## 2013-06-04 DIAGNOSIS — Z5189 Encounter for other specified aftercare: Secondary | ICD-10-CM

## 2013-06-04 DIAGNOSIS — S161XXD Strain of muscle, fascia and tendon at neck level, subsequent encounter: Secondary | ICD-10-CM

## 2013-06-04 DIAGNOSIS — M25519 Pain in unspecified shoulder: Secondary | ICD-10-CM

## 2013-06-04 DIAGNOSIS — M25511 Pain in right shoulder: Secondary | ICD-10-CM

## 2013-06-04 DIAGNOSIS — M542 Cervicalgia: Secondary | ICD-10-CM

## 2013-06-04 MED ORDER — METHYLPREDNISOLONE SODIUM SUCC 125 MG IJ SOLR: 125.0000 mg | Freq: Once | INTRAMUSCULAR | Status: DC

## 2013-06-04 MED ORDER — CYCLOBENZAPRINE HCL 10 MG PO TABS: 10.0000 mg | ORAL_TABLET | Freq: Three times a day (TID) | ORAL | Status: DC | PRN

## 2013-06-04 MED ORDER — METHYLPREDNISOLONE SODIUM SUCC 40 MG IJ SOLR
80.0000 mg | Freq: Once | INTRAMUSCULAR | Status: AC
  Administered 2013-06-03: 80 mg via INTRAMUSCULAR

## 2013-06-04 NOTE — Progress Notes (Signed)
   Subjective:    Patient ID: Lauren Cardenas, female    DOB: 06/22/1986, 27 y.o.   MRN: [id number]  HPI Patient in c/o right neck and shoulder pain- started about 4-5 weeks ago- denies any injury- rates pain 5-8 . Was seen lat night and was given a steroid shot and currently has no pain. SHe said pain radiates just a little down right arm. Nothing seems to make it worse but ibuprofen makes it better.    Review of Systems  Constitutional: Negative.   Respiratory: Negative.   Cardiovascular: Negative.   Neurological: Negative for tremors, weakness and numbness.       Objective:   Physical Exam  Constitutional: She is oriented to person, place, and time. She appears well-developed and well-nourished.  Cardiovascular: Normal rate, regular rhythm and normal heart sounds.   Pulmonary/Chest: Effort normal and breath sounds normal.  Musculoskeletal:  FROM of neck without pain. FROM of right shoulder without pain or tenderness Grips equal bilaterally Motor strength and sensation distally intact  Neurological: She is alert and oriented to person, place, and time. She has normal reflexes.   BP 153/84  Pulse 109  Temp(Src) 98.4 F (36.9 C) (Oral)  Ht 5\' 2"  (1.575 m)  Wt 183 lb (83.008 kg)  BMI 33.46 kg/m2 Xray cervical spine- slight reversal of normal curvature of cervical spine- Preliminary reading by Paulene Floor, FNP  [facility] Shoulder x ray - normal-Preliminary reading by Paulene Floor, FNP  [facility]        Assessment & Plan:   1. Neck pain   2. Right shoulder pain   3. Neck strain, subsequent encounter    Meds ordered this encounter  Medications  . cyclobenzaprine (FLEXERIL) 10 MG tablet    Sig: Take 1 tablet (10 mg total) by mouth 3 (three) times daily as needed for muscle spasms.    Dispense:  30 tablet    Refill:  0    Order Specific Question:  Supervising Provider    Answer:  Ernestina Penna [[id number]]    Moist heat Neck stretches RTO prn  Lauren Daphine Deutscher, FNP

## 2013-06-04 NOTE — Addendum Note (Signed)
Addended by: Gwenith Daily on: 06/04/2013 09:41 AM   Modules accepted: Orders

## 2013-06-04 NOTE — Patient Instructions (Signed)

## 2014-03-27 HISTORY — PX: WISDOM TOOTH EXTRACTION: SHX21

## 2014-04-28 ENCOUNTER — Encounter: Payer: Self-pay | Admitting: Nurse Practitioner

## 2014-06-12 ENCOUNTER — Ambulatory Visit (INDEPENDENT_AMBULATORY_CARE_PROVIDER_SITE_OTHER): Payer: Medicaid Other | Admitting: Nurse Practitioner

## 2014-06-12 ENCOUNTER — Encounter: Payer: Self-pay | Admitting: Nurse Practitioner

## 2014-06-12 VITALS — BP 138/82 | HR 88 | Temp 98.2°F | Ht 61.0 in | Wt 180.0 lb

## 2014-06-12 DIAGNOSIS — R300 Dysuria: Secondary | ICD-10-CM

## 2014-06-12 DIAGNOSIS — R350 Frequency of micturition: Secondary | ICD-10-CM

## 2014-06-12 LAB — POCT UA - MICROSCOPIC ONLY
BACTERIA, U MICROSCOPIC: NEGATIVE
CRYSTALS, UR, HPF, POC: NEGATIVE
Casts, Ur, LPF, POC: NEGATIVE
Mucus, UA: NEGATIVE
WBC, UR, HPF, POC: NEGATIVE
Yeast, UA: NEGATIVE

## 2014-06-12 LAB — POCT URINALYSIS DIPSTICK
BILIRUBIN UA: NEGATIVE
GLUCOSE UA: NEGATIVE
Ketones, UA: NEGATIVE
Leukocytes, UA: NEGATIVE
Nitrite, UA: NEGATIVE
Protein, UA: NEGATIVE
SPEC GRAV UA: 1.025
Urobilinogen, UA: NEGATIVE
pH, UA: 6.5

## 2014-06-12 MED ORDER — NITROFURANTOIN MONOHYD MACRO 100 MG PO CAPS: 100.0000 mg | ORAL_CAPSULE | Freq: Two times a day (BID) | ORAL | Status: DC

## 2014-06-12 NOTE — Progress Notes (Signed)
   Subjective:    Patient ID: Lauren Cardenas, female    DOB: [birth year], 28 y.o.   MRN: [id number]  HPI Patient in c/o dysuira- went to urgent on December 11,2015 and was diagnosed with UTI- was given bactrim BId- yesterday she started having worsening bladder pain and is still having constant feeling that she needs to pee.  She is also c/o sinus congestion.  Review of Systems  Constitutional: Negative for fever, chills and appetite change.  HENT: Positive for congestion and rhinorrhea.   Respiratory: Negative for cough.   Genitourinary: Positive for dysuria and pelvic pain.  Neurological: Negative.   Psychiatric/Behavioral: Negative.   All other systems reviewed and are negative.      Objective:   Physical Exam  Constitutional: She is oriented to person, place, and time. She appears well-developed and well-nourished. No distress.  HENT:  Right Ear: External ear normal.  Left Ear: External ear normal.  Nose: Nose normal.  Mouth/Throat: Oropharynx is clear and moist.  Neck: Normal range of motion.  Cardiovascular: Normal rate, regular rhythm and normal heart sounds.   Pulmonary/Chest: Effort normal and breath sounds normal.  Abdominal: Soft. Bowel sounds are normal. There is tenderness (mild suprapubic pain on palaption).  Genitourinary:  No CVA tenderness  Lymphadenopathy:    She has no cervical adenopathy.  Neurological: She is alert and oriented to person, place, and time.  Skin: Skin is warm and dry.  Psychiatric: She has a normal mood and affect. Her behavior is normal. Judgment and thought content normal.   BP 138/82 mmHg  Pulse 88  Temp(Src) 98.2 F (36.8 C) (Oral)  Ht 5\' 1"  (1.549 m)  Wt 180 lb (81.647 kg)  BMI 34.03 kg/m2         Assessment & Plan:   1. Frequent urination   2. Dysuria    Meds ordered this encounter  Medications  . nitrofurantoin, macrocrystal-monohydrate, (MACROBID) 100 MG capsule    Sig: Take 1 capsule (100 mg total) by mouth 2  (two) times daily.    Dispense:  14 capsule    Refill:  0    Order Specific Question:  Supervising Provider    Answer:  Ernestina PennaMOORE, DONALD W [[id number]   Stop bactrim- changed to macrobid Force fluids Urostat as needed RTO prn  Lauren Daphine DeutscherMartin, FNP

## 2014-06-12 NOTE — Patient Instructions (Signed)

## 2014-06-18 ENCOUNTER — Telehealth: Payer: Self-pay | Admitting: Nurse Practitioner

## 2014-06-18 DIAGNOSIS — R300 Dysuria: Secondary | ICD-10-CM

## 2014-06-18 NOTE — Telephone Encounter (Signed)
Please review and advise.

## 2014-06-19 NOTE — Telephone Encounter (Signed)
Referral sent to urology.

## 2014-07-28 DIAGNOSIS — Z87442 Personal history of urinary calculi: Secondary | ICD-10-CM | POA: Insufficient documentation

## 2014-09-04 ENCOUNTER — Other Ambulatory Visit: Payer: Self-pay | Admitting: Urology

## 2014-09-05 ENCOUNTER — Encounter (HOSPITAL_COMMUNITY): Payer: Self-pay

## 2014-09-05 NOTE — Patient Instructions (Addendum)
Lauren CurlingRachael D Cardenas  09/05/2014   Your procedure is scheduled on: Tuesday 09/09/14  [facility] and follow signs to               Short Stay Center at 08:00 AM.  Call this number if you have problems the morning of surgery [phone]   Remember:  Do not eat food or drink liquids :After Midnight.               You may not have any metal on your body including hair pins and              piercings  Do not wear jewelry, make-up, lotions, powders or perfumes.             Do not wear nail polish.  Do not shave  48 hours prior to surgery.              Men may shave face and neck.  Do not bring valuables to the hospital. [facility] IS NOT             RESPONSIBLE   FOR VALUABLES.  Contacts, dentures or bridgework may not be worn into surgery.   Patients discharged the day of surgery will not be allowed to drive home.  Name and phone number of your driver: Heloise OchoaJoey Cheaney [phone]   _____________________________________________________________________             [facility] - Preparing for Surgery Before surgery, you can play an important role.  Because skin is not sterile, your skin needs to be as free of germs as possible.  You can reduce the number of germs on your skin by washing with CHG (chlorahexidine gluconate) soap before surgery.  CHG is an antiseptic cleaner which kills germs and bonds with the skin to continue killing germs even after washing. Please DO NOT use if you have an allergy to CHG or antibacterial soaps.  If your skin becomes reddened/irritated stop using the CHG and inform your nurse when you arrive at Short Stay. Do not shave (including legs and underarms) for at least 48 hours prior to the first CHG shower.  You may shave your face/neck. Please follow these instructions carefully:  1.  Shower with CHG Soap the night before surgery and the  morning of Surgery.  2.  If you choose to wash your hair, wash your hair first as  usual with your  normal  shampoo.  3.  After you shampoo, rinse your hair and body thoroughly to remove the  shampoo.                            4.  Use CHG as you would any other liquid soap.  You can apply chg directly  to the skin and wash                       Gently with a scrungie or clean washcloth.  5.  Apply the CHG Soap to your body ONLY FROM THE NECK DOWN.   Do not use on face/ open                           Wound or open sores. Avoid contact with eyes, ears mouth and genitals (private parts).  Wash face,  Genitals (private parts) with your normal soap.             6.  Wash thoroughly, paying special attention to the area where your surgery  will be performed.  7.  Thoroughly rinse your body with warm water from the neck down.  8.  DO NOT shower/wash with your normal soap after using and rinsing off  the CHG Soap.                9.  Pat yourself dry with a clean towel.            10.  Wear clean pajamas.            11.  Place clean sheets on your bed the night of your first shower and do not  sleep with pets. Day of Surgery : Do not apply any lotions/deodorants the morning of surgery.  Please wear clean clothes to the hospital/surgery center.  FAILURE TO FOLLOW THESE INSTRUCTIONS MAY RESULT IN THE CANCELLATION OF YOUR SURGERY PATIENT SIGNATURE_________________________________  NURSE SIGNATURE__________________________________  ________________________________________________________________________

## 2014-09-08 ENCOUNTER — Encounter (HOSPITAL_COMMUNITY)
Admission: RE | Admit: 2014-09-08 | Discharge: 2014-09-08 | Disposition: A | Discharge status: Home / Self Care | Payer: Medicaid Other | Source: Ambulatory Visit | Attending: Urology | Admitting: Urology

## 2014-09-08 ENCOUNTER — Encounter (HOSPITAL_COMMUNITY): Payer: Self-pay

## 2014-09-08 DIAGNOSIS — N201 Calculus of ureter: Secondary | ICD-10-CM | POA: Diagnosis not present

## 2014-09-08 HISTORY — DX: Calculus of ureter: N20.1

## 2014-09-08 LAB — CBC
HEMATOCRIT: 40.3 % (ref 36.0–46.0)
HEMOGLOBIN: 12.6 g/dL (ref 12.0–15.0)
MCH: 25.6 pg — AB (ref 26.0–34.0)
MCHC: 31.3 g/dL (ref 30.0–36.0)
MCV: 81.9 fL (ref 78.0–100.0)
Platelets: 268 10*3/uL (ref 150–400)
RBC: 4.92 MIL/uL (ref 3.87–5.11)
RDW: 13.6 % (ref 11.5–15.5)
WBC: 14.8 10*3/uL — ABNORMAL HIGH (ref 4.0–10.5)

## 2014-09-08 LAB — HCG, SERUM, QUALITATIVE: Preg, Serum: NEGATIVE

## 2014-09-08 NOTE — H&P (Signed)
History of Present Illness   29 YO female patient of Dr. Estil DaftEskridge's seen 3 weeks ago as a work-in for acute on-set left flan pain.     GU Hx:    1- MH associated with dysuria, bladder pain and frequency - Jan 2016 - sp or bladder pain, left flank pain; mild frequency urgency and dysuria. U/A dip negative, but micro showed 25-30 rbc/hpf. No gross hematuria. She has no history of kidney stones. She has no exposure risk. She has no neurogenic risks.    January 2016 interval history  Patient returns for continued evaluation as a microscopic hematuria and lower urinary tract symptoms. She underwent a CT hematuria protocol which revealed a 4 mm left distal stone, minimal hydro-. Bilateral kidney stones which may be in the parenchyma or she may have some scarring from prior reflux. Reviewed all the images.     After the CT scan her symptoms resolve. She hasn't been straining her urine and therefore hasn't seen a stone pass but remains asymptomatic today. She's had no dysuria or gross hematuria and no frequency, no urgency, no flank pain.     Feb 2016 Interval Hx:   States that she never passed a stone that she is aware of; had been pain free until this am. Now with left flank pain, mild nausea, and diaphoresis. Denies f/c or vomiting.     March 2016 Interval Hx:   States that she continues to have intermittent pain which Tramadol did not control but she had some Hydrocodone from dental work which is controlling the pain. Denies passing any stone material. Denies f/c or hematuria.   Vitals Vital Signs [Data Includes: Last 1 Day]  Recorded: 08Mar2016 03:24PM  Blood Pressure: 144 / 92 Temperature: 98.4 F Heart Rate: 99  Physical Exam Constitutional: Well nourished and well developed . No acute distress. The patient appears well hydrated.  Abdomen: The abdomen is mildly obese. The abdomen is soft and nontender. Tenderness in the LLQ is present, but no suprapubic tenderness. mild left  CVA tenderness.    Results/Data Urine [Data Includes: Last 1 Day]   08Mar2016  COLOR YELLOW   APPEARANCE CLOUDY   SPECIFIC GRAVITY 1.020   pH 7.5   GLUCOSE NEG mg/dL  BILIRUBIN NEG   KETONE NEG mg/dL  BLOOD NEG   PROTEIN TRACE mg/dL  UROBILINOGEN 0.2 mg/dL  NITRITE NEG   LEUKOCYTE ESTERASE NEG   SQUAMOUS EPITHELIAL/HPF RARE   WBC 0-2 WBC/hpf  RBC 0-2 RBC/hpf  BACTERIA RARE   CRYSTALS NONE SEEN   CASTS NONE SEEN   Other AMORPHOUS    The following images/tracing/specimen were independently visualized:  CT urogram shows a slightly dilated left ureter to level of distal 4 mm UVJ calculus.  The following clinical lab reports were reviewed:  UA- negative. Selected Results  AU CT-STONE PROTOCOL 08Mar2016 12:00AM Jetta LoutWarden, Diane   Test Name Result Flag Reference  CT-STONE PROTOCOL (Report)    ** RADIOLOGY REPORT BY [facility] **   CLINICAL DATA: Left lower quadrant pain for the past 4 months. History of left ureteral stone.  EXAM: CT ABDOMEN AND PELVIS WITHOUT CONTRAST  TECHNIQUE: Multidetector CT imaging of the abdomen and pelvis was performed following the standard protocol without IV contrast.  COMPARISON: 07/14/2014  FINDINGS: Lower chest: Clear lung bases. No pleural fluid.  Liver and stomach partially excluded.  Hepatobiliary: Normal liver. Normal gallbladder, without biliary ductal dilatation.  Pancreas: Normal, without mass or ductal dilatation.  Spleen: Normal  Adrenals/Urinary Tract:  Normal adrenal glands. bilateral renal cortical scarring and collecting system stones again identified. The largest right-sided stone measures 5 mm. The largest left-sided stones measure 4 mm.  The punctate right distal ureteric stone questioned on the prior exam is no longer identified and may have passed. The distal left ureteric stone is similar in position, approximately 1 cm above the ureterovesicular junction. 4 x 5 mm on image 70. Minimal  similar proximal left hydroureter. No bladder calculi.  Stomach/Bowel: Normal stomach, without wall thickening. Normal terminal ileum and appendix. Normal small bowel.  Vascular/Lymphatic: Normal caliber of the aorta and branch vessels. No abdominopelvic adenopathy.  Reproductive: Normal uterus and adnexa.  Other: No significant free fluid.  Musculoskeletal: No acute osseous abnormality.  IMPRESSION: 1. Similar position of a distal left ureteric stone with minimal proximal left hydroureter. 2. The questioned distal right ureteric stone on the prior exam is no longer visualized, and may have passed. 3. 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   Electronically Signed  By: Jeronimo Greaves M.D.  On: 09/02/2014 18:58   Assessment Assessed  1. Calculus of ureter (N20.1) 2. Left flank pain (R10.9)  Plan  Calculus of ureter  1. Start: Hydrocodone-Acetaminophen 7.5-325 MG Oral Tablet; 1 po w 4-6 hrs prn Health Maintenance  2. UA With REFLEX; [Do Not Release]; Status:Complete;   Done: 08Mar2016 03:13PM  Will have pt continue with Tamsulosin daily  Hydrocodone 7.5/325 mg 1 po Q4-6 hrs prn #20/0RF  Discussed with Dr. Mena Goes will proceed at this time with cystourethroscopy, L RPG, stone extraction, and possible double J stent placement. Risk and benefits discussed of: general anesthesia, hematuria, infection, need for stent post procedure, or injury to bladder or ureter. Voices understanding and wishes to proceed.  AU CT-STONE PROTOCOL; Status:Resulted - Requires Verification;  Done: 01Jan0001 12:00AM Due:10Mar2016; Marked Important;Ordered; Today;  AVW:[id number] of ureter; Ordered NW:GNFAOZ, Diane;   Quarry manager signed by : Jetta Lout, Dyann Ruddle; Sep 03 2014  7:01AM EST

## 2014-09-09 ENCOUNTER — Ambulatory Visit (HOSPITAL_COMMUNITY): Payer: Medicaid Other | Admitting: Anesthesiology

## 2014-09-09 ENCOUNTER — Encounter (HOSPITAL_COMMUNITY)
Admission: RE | Disposition: A | Discharge status: Home / Self Care | Payer: Self-pay | Source: Ambulatory Visit | Attending: Urology

## 2014-09-09 ENCOUNTER — Encounter (HOSPITAL_COMMUNITY): Payer: Self-pay | Admitting: *Deleted

## 2014-09-09 ENCOUNTER — Ambulatory Visit (HOSPITAL_COMMUNITY)
Admission: RE | Admit: 2014-09-09 | Discharge: 2014-09-09 | Disposition: A | Discharge status: Home / Self Care | Payer: Medicaid Other | Source: Ambulatory Visit | Attending: Urology | Admitting: Urology

## 2014-09-09 DIAGNOSIS — N201 Calculus of ureter: Secondary | ICD-10-CM | POA: Diagnosis not present

## 2014-09-09 HISTORY — PX: HOLMIUM LASER APPLICATION: SHX5852

## 2014-09-09 HISTORY — PX: CYSTOSCOPY WITH RETROGRADE PYELOGRAM, URETEROSCOPY AND STENT PLACEMENT: SHX5789

## 2014-09-09 SURGERY — CYSTOURETEROSCOPY, WITH RETROGRADE PYELOGRAM AND STENT INSERTION
Anesthesia: General | Laterality: Left

## 2014-09-09 MED ORDER — NITROFURANTOIN MONOHYD MACRO 100 MG PO CAPS: 100.0000 mg | ORAL_CAPSULE | Freq: Every day | ORAL | Status: DC

## 2014-09-09 MED ORDER — 0.9 % SODIUM CHLORIDE (POUR BTL) OPTIME
TOPICAL | Status: DC | PRN
  Administered 2014-09-09: 1000 mL

## 2014-09-09 MED ORDER — PROPOFOL 10 MG/ML IV BOLUS
INTRAVENOUS | Status: AC
  Filled 2014-09-09: qty 20

## 2014-09-09 MED ORDER — IOHEXOL 350 MG/ML SOLN
INTRAVENOUS | Status: DC | PRN
  Administered 2014-09-09: 50 mL

## 2014-09-09 MED ORDER — LIDOCAINE HCL 2 % EX GEL
CUTANEOUS | Status: AC
  Filled 2014-09-09: qty 10

## 2014-09-09 MED ORDER — MIDAZOLAM HCL 2 MG/2ML IJ SOLN
INTRAMUSCULAR | Status: AC
  Filled 2014-09-09: qty 2

## 2014-09-09 MED ORDER — OXYCODONE-ACETAMINOPHEN 5-325 MG PO TABS: 1.0000 | ORAL_TABLET | Freq: Four times a day (QID) | ORAL | Status: DC | PRN

## 2014-09-09 MED ORDER — SCOPOLAMINE 1 MG/3DAYS TD PT72
MEDICATED_PATCH | TRANSDERMAL | Status: AC
  Filled 2014-09-09: qty 1

## 2014-09-09 MED ORDER — MIDAZOLAM HCL 5 MG/5ML IJ SOLN
INTRAMUSCULAR | Status: DC | PRN
  Administered 2014-09-09: 2 mg via INTRAVENOUS

## 2014-09-09 MED ORDER — SODIUM CHLORIDE 0.9 % IJ SOLN
INTRAMUSCULAR | Status: AC
  Filled 2014-09-09: qty 10

## 2014-09-09 MED ORDER — KETOROLAC TROMETHAMINE 30 MG/ML IJ SOLN
INTRAMUSCULAR | Status: AC
  Filled 2014-09-09: qty 1

## 2014-09-09 MED ORDER — DEXAMETHASONE SODIUM PHOSPHATE 10 MG/ML IJ SOLN
INTRAMUSCULAR | Status: DC | PRN
  Administered 2014-09-09: 10 mg via INTRAVENOUS

## 2014-09-09 MED ORDER — LACTATED RINGERS IV SOLN
INTRAVENOUS | Status: DC
  Administered 2014-09-09: 09:00:00 via INTRAVENOUS

## 2014-09-09 MED ORDER — LACTATED RINGERS IV SOLN: INTRAVENOUS | Status: DC | PRN

## 2014-09-09 MED ORDER — ONDANSETRON HCL 4 MG/2ML IJ SOLN
INTRAMUSCULAR | Status: DC | PRN
  Administered 2014-09-09: 4 mg via INTRAVENOUS

## 2014-09-09 MED ORDER — SODIUM CHLORIDE 0.9 % IR SOLN
Status: DC | PRN
  Administered 2014-09-09: 3000 mL via INTRAVESICAL

## 2014-09-09 MED ORDER — CIPROFLOXACIN IN D5W 400 MG/200ML IV SOLN
INTRAVENOUS | Status: AC
  Filled 2014-09-09: qty 200

## 2014-09-09 MED ORDER — PROPOFOL 10 MG/ML IV BOLUS
INTRAVENOUS | Status: DC | PRN
  Administered 2014-09-09: 200 mg via INTRAVENOUS

## 2014-09-09 MED ORDER — KETOROLAC TROMETHAMINE 30 MG/ML IJ SOLN
INTRAMUSCULAR | Status: DC | PRN
  Administered 2014-09-09: 30 mg via INTRAVENOUS

## 2014-09-09 MED ORDER — CIPROFLOXACIN IN D5W 400 MG/200ML IV SOLN
400.0000 mg | INTRAVENOUS | Status: AC
  Administered 2014-09-09: 400 mg via INTRAVENOUS

## 2014-09-09 MED ORDER — GLYCOPYRROLATE 0.2 MG/ML IJ SOLN
INTRAMUSCULAR | Status: AC
  Filled 2014-09-09: qty 1

## 2014-09-09 MED ORDER — KETOROLAC TROMETHAMINE 30 MG/ML IJ SOLN: 30.0000 mg | Freq: Once | INTRAMUSCULAR | Status: DC | PRN

## 2014-09-09 MED ORDER — FENTANYL CITRATE 0.05 MG/ML IJ SOLN: 25.0000 ug | INTRAMUSCULAR | Status: DC | PRN

## 2014-09-09 MED ORDER — SCOPOLAMINE 1 MG/3DAYS TD PT72
MEDICATED_PATCH | TRANSDERMAL | Status: DC | PRN
  Administered 2014-09-09: 1 via TRANSDERMAL

## 2014-09-09 MED ORDER — SODIUM CHLORIDE 0.9 % IR SOLN
Status: DC | PRN
  Administered 2014-09-09: 1000 mL via INTRAVESICAL

## 2014-09-09 MED ORDER — FENTANYL CITRATE 0.05 MG/ML IJ SOLN
INTRAMUSCULAR | Status: DC | PRN
  Administered 2014-09-09 (×3): 50 ug via INTRAVENOUS

## 2014-09-09 MED ORDER — PROMETHAZINE HCL 25 MG/ML IJ SOLN: 6.2500 mg | INTRAMUSCULAR | Status: DC | PRN

## 2014-09-09 MED ORDER — FENTANYL CITRATE 0.05 MG/ML IJ SOLN
INTRAMUSCULAR | Status: AC
  Filled 2014-09-09: qty 5

## 2014-09-09 MED ORDER — LIDOCAINE HCL (PF) 2 % IJ SOLN
INTRAMUSCULAR | Status: DC | PRN
  Administered 2014-09-09: 75 mg via INTRADERMAL

## 2014-09-09 SURGICAL SUPPLY — 28 items
BAG URO CATCHER STRL LF (DRAPE) ×3 IMPLANT
BASKET LASER NITINOL 1.9FR (BASKET) ×3 IMPLANT
BASKET STNLS GEMINI 4WIRE 3FR (BASKET) IMPLANT
BASKET ZERO TIP NITINOL 2.4FR (BASKET) IMPLANT
BRUSH URET BIOPSY 3F (UROLOGICAL SUPPLIES) IMPLANT
BSKT STON RTRVL GEM 120X11 3FR (BASKET)
CATH INTERMIT  6FR 70CM (CATHETERS) IMPLANT
CLOTH BEACON ORANGE TIMEOUT ST (SAFETY) ×3 IMPLANT
FIBER LASER FLEXIVA 1000 (UROLOGICAL SUPPLIES) IMPLANT
FIBER LASER FLEXIVA 200 (UROLOGICAL SUPPLIES) ×3 IMPLANT
FIBER LASER FLEXIVA 365 (UROLOGICAL SUPPLIES) IMPLANT
FIBER LASER FLEXIVA 550 (UROLOGICAL SUPPLIES) IMPLANT
FIBER LASER TRAC TIP (UROLOGICAL SUPPLIES) IMPLANT
GLOVE BIOGEL M STRL SZ7.5 (GLOVE) ×3 IMPLANT
GOWN STRL REUS W/TWL XL LVL3 (GOWN DISPOSABLE) ×3 IMPLANT
GUIDEWIRE ANG ZIPWIRE 038X150 (WIRE) IMPLANT
GUIDEWIRE STR DUAL SENSOR (WIRE) IMPLANT
IV NS IRRIG 3000ML ARTHROMATIC (IV SOLUTION) ×6 IMPLANT
KIT BALLIN UROMAX 15FX10 (LABEL) IMPLANT
KIT BALLN UROMAX 15FX4 (MISCELLANEOUS) IMPLANT
KIT BALLN UROMAX 26 75X4 (MISCELLANEOUS)
PACK CYSTO (CUSTOM PROCEDURE TRAY) ×6 IMPLANT
SET HIGH PRES BAL DIL (LABEL)
SHEATH ACCESS URETERAL 24CM (SHEATH) IMPLANT
SHEATH ACCESS URETERAL 54CM (SHEATH) IMPLANT
SHIELD EYE BINOCULAR (MISCELLANEOUS) ×3 IMPLANT
STENT URET 6FRX24 CONTOUR (STENTS) ×3 IMPLANT
SYRINGE IRR TOOMEY STRL 70CC (SYRINGE) IMPLANT

## 2014-09-09 NOTE — Op Note (Signed)
Preoperative diagnosis: Left distal ureteral stone Postoperative diagnosis: Left distal ureteral stone Procedure: Cystoscopy, left ureteroscopy, holmium laser lithotripsy, stone basket extraction and stent placement  Surgeon: Jeramey Lanuza  Type of anesthesia: Gen.  Indication for procedure: 29 year old with a distal left ureteral stone with failure to pass/progress. She was brought for definitive stone management.  Findings: I noted on CT scan she appears to have some scarring and parenchymal also both kidneys for example at the left lower pole. On cystoscopy the ureteral orifices were very lateral and I suspect she had vesicoureteral reflux as a child. On ureteroscopy the stone was located in the left distal ureter that required fragmentation and removal. Ureter appeared normal prior to stent placement. I was able to perform ureteroscopy up into the left proximal ureter. I did not approach the kidney.  Left retrograde pyelogram-this outlined a narrow tight intramural ureter with filling defect right at the ureterovesical junction consistent with the prior stone with dilation of the distal and mid ureter proximally.  Description of procedure: After consent was obtained patient brought to the operating room. After adequate anesthesia she was placed in lithotomy position and prepped and draped in the usual sterile fashion. A timeout was performed to confirm the patient and procedure. The cystoscope was passed per urethra and the left ureteral orifice located. Of note the bladder appeared normal. There was no stone or foreign body in the bladder. The bladder mucosa appeared normal. The trigone appeared normal but the ureteral orifices were out lateral several centimeters on both sides.  The left ureteral orifice was cannulated with a 6 [place]French open-ended catheter. The stone was visible on scout imaging. Gentle retrograde injection of contrast was performed. In a sensor wire was advanced and coiled in the  collecting system. The needle semirigid ureteroscope was advanced and the stone was grasped with an escape basket. The intramural ureter was quite narrow and the stone would not advance through it. Therefore the stone was released and a tuna microns laser fiber was advanced. I was able to wiggle the stone down at a setting of 0.2 and 40. The 2 largest pieces were then grasped with the escape basket and removed without difficulty. Ureteroscopy was performed again and there was some dust along the course of the distal ureter but no significant stone fragments. I was able to go up above the iliacs where the ureter was completely clear. The ureter appeared normal without injury as the scope was removed. The sensor wire was backloaded on the cystoscope and a 6 x 24 cm stent was advanced. The wire was removed with a good coil seen in the kidney and a good coil in the bladder. The bladder was drained and the scope removed.  The string was secured to the patient. She was awakened and taken to the recovery room in stable condition.  Complications: None Blood loss: Minimal  Specimens: Stone fragments to office lab.  Drains: 6 x 24 cm left ureteral stent with string

## 2014-09-09 NOTE — Anesthesia Preprocedure Evaluation (Signed)
Anesthesia Evaluation  Patient identified by MRN, date of birth, ID band Patient awake    Reviewed: Allergy & Precautions, NPO status , Patient's Chart, lab work & pertinent test results  Airway Mallampati: II  TM Distance: >3 FB Neck ROM: Full    Dental no notable dental hx.    Pulmonary neg pulmonary ROS,  breath sounds clear to auscultation  Pulmonary exam normal       Cardiovascular negative cardio ROS  Rhythm:Regular Rate:Normal     Neuro/Psych negative neurological ROS  negative psych ROS   GI/Hepatic negative GI ROS, Neg liver ROS,   Endo/Other  negative endocrine ROS  Renal/GU negative Renal ROS  negative genitourinary   Musculoskeletal negative musculoskeletal ROS (+)   Abdominal   Peds negative pediatric ROS (+)  Hematology negative hematology ROS (+)   Anesthesia Other Findings   Reproductive/Obstetrics negative OB ROS                             Anesthesia Physical Anesthesia Plan  ASA: I  Anesthesia Plan: General   Post-op Pain Management:    Induction: Intravenous  Airway Management Planned: LMA  Additional Equipment:   Intra-op Plan:   Post-operative Plan:   Informed Consent: I have reviewed the patients History and Physical, chart, labs and discussed the procedure including the risks, benefits and alternatives for the proposed anesthesia with the patient or authorized representative who has indicated his/her understanding and acceptance.   Dental advisory given  Plan Discussed with: CRNA and Surgeon  Anesthesia Plan Comments:         Anesthesia Quick Evaluation

## 2014-09-09 NOTE — Anesthesia Postprocedure Evaluation (Signed)
  Anesthesia Post-op Note  Patient: Lauren Cardenas  Procedure(s) Performed: Procedure(s) (LRB): CYSTOSCOPY WITH LEFT RETROGRADE PYELOGRAM, URETEROSCOPY AND STONE EXTRACTION  DOUBLE J STENT PLACEMENT (Left) HOLMIUM LASER APPLICATION (Left)  Patient Location: PACU  Anesthesia Type: General  Level of Consciousness: awake and alert   Airway and Oxygen Therapy: Patient Spontanous Breathing  Post-op Pain: mild  Post-op Assessment: Post-op Vital signs reviewed, Patient's Cardiovascular Status Stable, Respiratory Function Stable, Patent Airway and No signs of Nausea or vomiting  Last Vitals:  Filed Vitals:   09/09/14 0746  BP: 128/89  Pulse: 91  Temp: 36.8 C  Resp: 20    Post-op Vital Signs: stable   Complications: No apparent anesthesia complications

## 2014-09-09 NOTE — Interval H&P Note (Signed)
History and Physical Interval Note:  09/09/2014 10:04 AM  Lauren Cardenas  has presented today for surgery, with the diagnosis of left distal ureteral calculus  The various methods of treatment have been discussed with the patient and family. After consideration of risks, benefits and other options for treatment, the patient has consented to  Procedure(s): CYSTOSCOPY WITH LEFT RETROGRADE PYELOGRAM, URETEROSCOPY AND STONE EXTRACTION POSSIBLE DOUBLE J STENT PLACEMENT (Left) HOLMIUM LASER APPLICATION (Left) as a surgical intervention.  The patient's history has been reviewed, patient examined, no change in status, stable for surgery.  I have reviewed the patient's chart and labs. I disucssed paitent with NP Myrtie SomanWarden, reviewed notes, and imaging as well. I discussed with the patient the nature, potential benefits, risks and alternatives to left URS, including side effects of the proposed treatment, the likelihood of the patient achieving the goals of the procedure, and any potential problems that might occur during the procedure or recuperation. All questions answered. Patient elects to proceed.     Bettye Sitton

## 2014-09-09 NOTE — Transfer of Care (Signed)
Immediate Anesthesia Transfer of Care Note  Patient: Lauren Cardenas  Procedure(s) Performed: Procedure(s): CYSTOSCOPY WITH LEFT RETROGRADE PYELOGRAM, URETEROSCOPY AND STONE EXTRACTION  DOUBLE J STENT PLACEMENT (Left) HOLMIUM LASER APPLICATION (Left)  Patient Location: PACU  Anesthesia Type:General  Level of Consciousness: awake, alert , oriented and patient cooperative  Airway & Oxygen Therapy: Patient Spontanous Breathing and Patient connected to face mask oxygen  Post-op Assessment: Report given to RN, Post -op Vital signs reviewed and stable and Patient moving all extremities X 4  Post vital signs: Reviewed and stable  Last Vitals:  Filed Vitals:   09/09/14 0746  BP: 128/89  Pulse: 91  Temp: 36.8 C  Resp: 20    Complications: No apparent anesthesia complications

## 2014-09-09 NOTE — Anesthesia Procedure Notes (Signed)
Procedure Name: LMA Insertion Date/Time: 09/09/2014 10:19 AM Performed by: Elyn PeersALLEN, [place] Yeats J Pre-anesthesia Checklist: Patient identified, Emergency Drugs available, Suction available, Patient being monitored and Timeout performed Patient Re-evaluated:Patient Re-evaluated prior to inductionOxygen Delivery Method: Circle system utilized Preoxygenation: Pre-oxygenation with 100% oxygen Intubation Type: IV induction Ventilation: Mask ventilation without difficulty LMA: LMA inserted LMA Size: 4.0 Number of attempts: 1 Placement Confirmation: positive ETCO2 and breath sounds checked- equal and bilateral Tube secured with: Tape Dental Injury: Teeth and Oropharynx as per pre-operative assessment

## 2014-09-09 NOTE — Discharge Instructions (Signed)
Ureteral Stent Implantation, Care After Refer to this sheet in the next few weeks. These instructions provide you with information on caring for yourself after your procedure. Your health care provider may also give you more specific instructions. Your treatment has been planned according to current medical practices, but problems sometimes occur. Call your health care provider if you have any problems or questions after your procedure. WHAT TO EXPECT AFTER THE PROCEDURE You should be back to normal activity within 48 hours after the procedure. Nausea and vomiting may occur and are commonly the result of anesthesia. It is common to experience sharp pain in the back or lower abdomen and penis with voiding. This is caused by movement of the ends of the stent with the act of urinating.It usually goes away within minutes after you have stopped urinating. HOME CARE INSTRUCTIONS Make sure to drink plenty of fluids. You may have small amounts of bleeding, causing your urine to be red. This is normal. Certain movements may trigger pain or a feeling that you need to urinate. You may be given medicines to prevent infection or bladder spasms. Be sure to take all medicines as directed. Only take over-the-counter or prescription medicines for pain, discomfort, or fever as directed by your health care provider. Do not take aspirin, as this can make bleeding worse.  REMOVAL OF THE STENT: Remove the stent on Monday, Sep 15, 2014 by pulling the string with slow, steady pressure until the entire stent is removed.  Be sure to keep all follow-up appointments so your health care provider can check that you are healing properly.   SEEK MEDICAL CARE IF:  You experience increasing pain.  Your pain medicine is not working. SEEK IMMEDIATE MEDICAL CARE IF:  Your urine is dark red or has blood clots.  You are leaking urine (incontinent).  You have a fever, chills, feeling sick to your stomach (nausea), or  vomiting.  Your pain is not relieved by pain medicine.  The end of the stent comes out of the urethra.  You are unable to urinate.

## 2014-09-10 ENCOUNTER — Encounter (HOSPITAL_COMMUNITY): Payer: Self-pay | Admitting: Urology

## 2014-09-15 DIAGNOSIS — N1370 Vesicoureteral-reflux, unspecified: Secondary | ICD-10-CM | POA: Diagnosis present

## 2014-12-16 ENCOUNTER — Ambulatory Visit (INDEPENDENT_AMBULATORY_CARE_PROVIDER_SITE_OTHER): Payer: Medicaid Other | Admitting: Physician Assistant

## 2014-12-16 ENCOUNTER — Encounter: Payer: Self-pay | Admitting: Physician Assistant

## 2014-12-16 VITALS — BP 142/96 | HR 91 | Temp 98.7°F | Ht 61.0 in | Wt 179.0 lb

## 2014-12-16 DIAGNOSIS — J309 Allergic rhinitis, unspecified: Secondary | ICD-10-CM | POA: Diagnosis not present

## 2014-12-16 MED ORDER — CETIRIZINE HCL 10 MG PO TABS: 10.0000 mg | ORAL_TABLET | Freq: Every day | ORAL | Status: DC

## 2014-12-16 MED ORDER — FLUTICASONE PROPIONATE 50 MCG/ACT NA SUSP: 2.0000 | Freq: Every day | NASAL | Status: DC

## 2014-12-16 NOTE — Progress Notes (Signed)
   Subjective:    Patient ID: Mindi Curling, female    DOB: 1986-03-11, 29 y.o.   MRN: [id number]  HPI 29 y/o female presents with c/o bilateral ear "roaring" and feels like fluid is moving around on her ear drums x 4 days . Has tried Advil sinus with no relief.     Review of Systems  Constitutional: Negative.   HENT: Positive for congestion (nasal and chest ), postnasal drip, rhinorrhea, sinus pressure and sneezing. Ear pain: roaring in ears, feel closed    Eyes: Negative.   Respiratory: Positive for cough (nonproductive, worse at night ).   Cardiovascular: Negative.   Neurological: Positive for headaches (intermittent ).  All other systems reviewed and are negative.      Objective:   Physical Exam  Constitutional: She is oriented to person, place, and time. She appears well-developed and well-nourished. No distress.  HENT:  Head: Normocephalic and atraumatic.  Right Ear: External ear normal.  Left Ear: External ear normal.  Nasal turbinates boggy and erythematous bilaterally   Eyes: Conjunctivae are normal. Pupils are equal, round, and reactive to light.  Cardiovascular: Normal rate and regular rhythm.  Exam reveals no gallop and no friction rub.   No murmur heard. Hypertensive   Pulmonary/Chest: Effort normal. No respiratory distress. She has no wheezes. She has no rales. She exhibits no tenderness.  Neurological: She is alert and oriented to person, place, and time.  Skin: She is not diaphoretic.  Psychiatric: She has a normal mood and affect. Her behavior is normal. Judgment and thought content normal.  Nursing note and vitals reviewed.         Assessment & Plan:  1. Allergic rhinitis, unspecified allergic rhinitis type  - cetirizine (ZYRTEC) 10 MG tablet; Take 1 tablet (10 mg total) by mouth daily.  Dispense: 30 tablet; Refill: 11 - fluticasone (FLONASE) 50 MCG/ACT nasal spray; Place 2 sprays into both nostrils daily.  Dispense: 16 g; Refill: 6  RTO prn    Tiffany A. Chauncey Reading PA-C

## 2014-12-16 NOTE — Patient Instructions (Signed)
Over the counter plain musinex as directed   Take BP's and log on sheet until f/u in 2 weeks   Allergic Rhinitis Allergic rhinitis is when the mucous membranes in the nose respond to allergens. Allergens are particles in the air that cause your body to have an allergic reaction. This causes you to release allergic antibodies. Through a chain of events, these eventually cause you to release histamine into the blood stream. Although meant to protect the body, it is this release of histamine that causes your discomfort, such as frequent sneezing, congestion, and an itchy, runny nose.  CAUSES  Seasonal allergic rhinitis (hay fever) is caused by pollen allergens that may come from grasses, trees, and weeds. Year-round allergic rhinitis (perennial allergic rhinitis) is caused by allergens such as house dust mites, pet dander, and mold spores.  SYMPTOMS   Nasal stuffiness (congestion).  Itchy, runny nose with sneezing and tearing of the eyes. DIAGNOSIS  Your health care provider can help you determine the allergen or allergens that trigger your symptoms. If you and your health care provider are unable to determine the allergen, skin or blood testing may be used. TREATMENT  Allergic rhinitis does not have a cure, but it can be controlled by:  Medicines and allergy shots (immunotherapy).  Avoiding the allergen. Hay fever may often be treated with antihistamines in pill or nasal spray forms. Antihistamines block the effects of histamine. There are over-the-counter medicines that may help with nasal congestion and swelling around the eyes. Check with your health care provider before taking or giving this medicine.  If avoiding the allergen or the medicine prescribed do not work, there are many new medicines your health care provider can prescribe. Stronger medicine may be used if initial measures are ineffective. Desensitizing injections can be used if medicine and avoidance does not work. Desensitization  is when a patient is given ongoing shots until the body becomes less sensitive to the allergen. Make sure you follow up with your health care provider if problems continue. HOME CARE INSTRUCTIONS It is not possible to completely avoid allergens, but you can reduce your symptoms by taking steps to limit your exposure to them. It helps to know exactly what you are allergic to so that you can avoid your specific triggers. SEEK MEDICAL CARE IF:   You have a fever.  You develop a cough that does not stop easily (persistent).  You have shortness of breath.  You start wheezing.  Symptoms interfere with normal daily activities. Document Released: 03/08/2001 Document Revised: 06/18/2013 Document Reviewed: 02/18/2013 [facility] Patient Information 2015 [place], [place]. This information is not intended to replace advice given to you by your health care provider. Make sure you discuss any questions you have with your health care provider.

## 2014-12-31 ENCOUNTER — Encounter: Payer: Self-pay | Admitting: Physician Assistant

## 2014-12-31 ENCOUNTER — Ambulatory Visit (INDEPENDENT_AMBULATORY_CARE_PROVIDER_SITE_OTHER): Payer: Medicaid Other | Admitting: Physician Assistant

## 2014-12-31 VITALS — BP 134/95 | HR 87 | Temp 98.0°F | Ht 61.0 in | Wt 180.0 lb

## 2014-12-31 DIAGNOSIS — R030 Elevated blood-pressure reading, without diagnosis of hypertension: Secondary | ICD-10-CM

## 2014-12-31 DIAGNOSIS — IMO0001 Reserved for inherently not codable concepts without codable children: Secondary | ICD-10-CM

## 2014-12-31 NOTE — Progress Notes (Signed)
   Subjective:    Patient ID: Lauren Cardenas, female    DOB: 04-30-1986, 29 y.o.   MRN: [id number]  HPI 29 y/o female presents for follow up of elevated BP at her last visit. Lauren Cardenas has been checking her BP's at home and logging them with am and pm readings which have ranged from 120-135/70's-90's.     Review of Systems  Constitutional: Negative.   HENT: Negative.   Eyes: Negative for photophobia and visual disturbance.  Respiratory: Negative for cough, chest tightness, shortness of breath and wheezing.   Cardiovascular: Negative for chest pain, palpitations and leg swelling.  Genitourinary: Negative.   Neurological: Negative.        Objective:   Physical Exam  Constitutional: Lauren Cardenas is oriented to person, place, and time. Lauren Cardenas appears well-developed and well-nourished. No distress.  HENT:  Head: Normocephalic and atraumatic.  Cardiovascular: Normal rate and regular rhythm.  Exam reveals no gallop and no friction rub.   No murmur heard. Mildly Hypertensive   Pulmonary/Chest: Effort normal and breath sounds normal. No respiratory distress. Lauren Cardenas has no wheezes. Lauren Cardenas has no rales. Lauren Cardenas exhibits no tenderness.  Abdominal:  Obese   Musculoskeletal: Lauren Cardenas exhibits no edema.  Neurological: Lauren Cardenas is alert and oriented to person, place, and time. Lauren Cardenas has normal reflexes.  Skin: Lauren Cardenas is not diaphoretic.  Psychiatric: Lauren Cardenas has a normal mood and affect. Her behavior is normal. Judgment and thought content normal.  Nursing note and vitals reviewed.         Assessment & Plan:  1. Elevated BP - Possible white coat htn but I have encouraged patient to lose weight and exercise to decrease BP and overall healthy living. Handouts were given for portion control, exercise and diet. I have also advised her to calibrate her BP cuff at home with correct readings at the local pharmacy. I will have her f/u in 3 months to recheck and determine if antihypertensive is needed. At that time Lauren Cardenas will also have fasting  labs.   RTO 3 months   Owyn Raulston A. Chauncey ReadingGann PA-C

## 2014-12-31 NOTE — Patient Instructions (Signed)
Exercise to Lose Weight Exercise and a healthy diet may help you lose weight. Your doctor may suggest specific exercises. EXERCISE IDEAS AND TIPS  Choose low-cost things you enjoy doing, such as walking, bicycling, or exercising to workout videos.  Take stairs instead of the elevator.  Walk during your lunch break.  Park your car further away from work or school.  Go to a gym or an exercise class.  Start with 5 to 10 minutes of exercise each day. Build up to 30 minutes of exercise 4 to 6 days a week.  Wear shoes with good support and comfortable clothes.  Stretch before and after working out.  Work out until you breathe harder and your heart beats faster.  Drink extra water when you exercise.  Do not do so much that you hurt yourself, feel dizzy, or get very short of breath. Exercises that burn about 150 calories:  Running 1  miles in 15 minutes.  Playing volleyball for 45 to 60 minutes.  Washing and waxing a car for 45 to 60 minutes.  Playing touch football for 45 minutes.  Walking 1  miles in 35 minutes.  Pushing a stroller 1  miles in 30 minutes.  Playing basketball for 30 minutes.  Raking leaves for 30 minutes.  Bicycling 5 miles in 30 minutes.  Walking 2 miles in 30 minutes.  Dancing for 30 minutes.  Shoveling snow for 15 minutes.  Swimming laps for 20 minutes.  Walking up stairs for 15 minutes.  Bicycling 4 miles in 15 minutes.  Gardening for 30 to 45 minutes.  Jumping rope for 15 minutes.  Washing windows or floors for 45 to 60 minutes. Document Released: 07/16/2010 Document Revised: 09/05/2011 Document Reviewed: 07/16/2010 [employer] Patient Information 2015 [employer]. This information is not intended to replace advice given to you by your health care provider. Make sure you discuss any questions you have with your health care provider. Calorie Counting for Weight Loss Calories are energy you get from the things you eat and drink. Your  body uses this energy to keep you going throughout the day. The number of calories you eat affects your weight. When you eat more calories than your body needs, your body stores the extra calories as fat. When you eat fewer calories than your body needs, your body burns fat to get the energy it needs. Calorie counting means keeping track of how many calories you eat and drink each day. If you make sure to eat fewer calories than your body needs, you should lose weight. In order for calorie counting to work, you will need to eat the number of calories that are right for you in a day to lose a healthy amount of weight per week. A healthy amount of weight to lose per week is usually 1-2 lb (0.5-0.9 kg). A dietitian can determine how many calories you need in a day and give you suggestions on how to reach your calorie goal.  WHAT IS MY MY PLAN? My goal is to have __________ calories per day.  If I have this many calories per day, I should lose around __________ pounds per week. WHAT DO I NEED TO KNOW ABOUT CALORIE COUNTING? In order to meet your daily calorie goal, you will need to:  Find out how many calories are in each food you would like to eat. Try to do this before you eat.  Decide how much of the food you can eat.  Write down what you ate and   how many calories it had. Doing this is called keeping a food log. WHERE DO I FIND CALORIE INFORMATION? The number of calories in a food can be found on a Nutrition Facts label. Note that all the information on a label is based on a specific serving of the food. If a food does not have a Nutrition Facts label, try to look up the calories online or ask your dietitian for help. HOW DO I DECIDE HOW MUCH TO EAT? To decide how much of the food you can eat, you will need to consider both the number of calories in one serving and the size of one serving. This information can be found on the Nutrition Facts label. If a food does not have a Nutrition Facts label, look  up the information online or ask your dietitian for help. Remember that calories are listed per serving. If you choose to have more than one serving of a food, you will have to multiply the calories per serving by the amount of servings you plan to eat. For example, the label on a package of bread might say that a serving size is 1 slice and that there are 90 calories in a serving. If you eat 1 slice, you will have eaten 90 calories. If you eat 2 slices, you will have eaten 180 calories. HOW DO I KEEP A FOOD LOG? After each meal, record the following information in your food log:  What you ate.  How much of it you ate.  How many calories it had.  Then, add up your calories. Keep your food log near you, such as in a small notebook in your pocket. Another option is to use a mobile app or website. Some programs will calculate calories for you and show you how many calories you have left each time you add an item to the log. WHAT ARE SOME CALORIE COUNTING TIPS?  Use your calories on foods and drinks that will fill you up and not leave you hungry. Some examples of this include foods like nuts and nut butters, vegetables, lean proteins, and high-fiber foods (more than 5 g fiber per serving).  Eat nutritious foods and avoid empty calories. Empty calories are calories you get from foods or beverages that do not have many nutrients, such as candy and soda. It is better to have a nutritious high-calorie food (such as an avocado) than a food with few nutrients (such as a bag of chips).  Know how many calories are in the foods you eat most often. This way, you do not have to look up how many calories they have each time you eat them.  Look out for foods that may seem like low-calorie foods but are really high-calorie foods, such as baked goods, soda, and fat-free candy.  Pay attention to calories in drinks. Drinks such as sodas, specialty coffee drinks, alcohol, and juices have a lot of calories yet do  not fill you up. Choose low-calorie drinks like water and diet drinks.  Focus your calorie counting efforts on higher calorie items. Logging the calories in a garden salad that contains only vegetables is less important than calculating the calories in a milk shake.  Find a way of tracking calories that works for you. Get creative. Most people who are successful find ways to keep track of how much they eat in a day, even if they do not count every calorie. WHAT ARE SOME PORTION CONTROL TIPS?  Know how many calories are in a   serving. This will help you know how many servings of a certain food you can have.  Use a measuring cup to measure serving sizes. This is helpful when you start out. With time, you will be able to estimate serving sizes for some foods.  Take some time to put servings of different foods on your favorite plates, bowls, and cups so you know what a serving looks like.  Try not to eat straight from a bag or box. Doing this can lead to overeating. Put the amount you would like to eat in a cup or on a plate to make sure you are eating the right portion.  Use smaller plates, glasses, and bowls to prevent overeating. This is a quick and easy way to practice portion control. If your plate is smaller, less food can fit on it.  Try not to multitask while eating, such as watching TV or using your computer. If it is time to eat, sit down at a table and enjoy your food. Doing this will help you to start recognizing when you are full. It will also make you more aware of what and how much you are eating. HOW CAN I CALORIE COUNT WHEN EATING OUT?  Ask for smaller portion sizes or child-sized portions.  Consider sharing an entree and sides instead of getting your own entree.  If you get your own entree, eat only half. Ask for a box at the beginning of your meal and put the rest of your entree in it so you are not tempted to eat it.  Look for the calories on the menu. If calories are listed,  choose the lower calorie options.  Choose dishes that include vegetables, fruits, whole grains, low-fat dairy products, and lean protein. Focusing on smart food choices from each of the 5 food groups can help you stay on track at restaurants.  Choose items that are boiled, broiled, grilled, or steamed.  Choose water, milk, unsweetened iced tea, or other drinks without added sugars. If you want an alcoholic beverage, choose a lower calorie option. For example, a regular margarita can have up to 700 calories and a glass of wine has around 150.  Stay away from items that are buttered, battered, fried, or served with cream sauce. Items labeled "crispy" are usually fried, unless stated otherwise.  Ask for dressings, sauces, and syrups on the side. These are usually very high in calories, so do not eat much of them.  Watch out for salads. Many people think salads are a healthy option, but this is often not the case. Many salads come with bacon, fried chicken, lots of cheese, fried chips, and dressing. All of these items have a lot of calories. If you want a salad, choose a garden salad and ask for grilled meats or steak. Ask for the dressing on the side, or ask for olive oil and vinegar or lemon to use as dressing.  Estimate how many servings of a food you are given. For example, a serving of cooked rice is  cup or about the size of half a tennis ball or one cupcake wrapper. Knowing serving sizes will help you be aware of how much food you are eating at restaurants. The list below tells you how big or small some common portion sizes are based on everyday objects.  1 oz--4 stacked dice.  3 oz--1 deck of cards.  1 tsp--1 dice.  1 Tbsp-- a Ping-Pong ball.  2 Tbsp--1 Ping-Pong ball.   cup--1 tennis ball   or 1 cupcake wrapper.  1 cup--1 baseball. Document Released: 06/13/2005 Document Revised: 10/28/2013 Document Reviewed: 04/18/2013 [facility] Patient Information 2015 [place], [place]. This  information is not intended to replace advice given to you by your health care provider. Make sure you discuss any questions you have with your health care provider. Cardiac Diet This diet can help prevent heart disease and stroke. Many factors influence your heart health, including eating and exercise habits. Coronary risk rises a lot with abnormal blood fat (lipid) levels. Cardiac meal planning includes limiting unhealthy fats, increasing healthy fats, and making other small dietary changes. General guidelines are as follows:  Adjust calorie intake to reach and maintain desirable body weight.  Limit total fat intake to less than 30% of total calories. Saturated fat should be less than 7% of calories.  Saturated fats are found in animal products and in some vegetable products. Saturated vegetable fats are found in coconut oil, cocoa butter, palm oil, and palm kernel oil. Read labels carefully to avoid these products as much as possible. Use butter in moderation. Choose tub margarines and oils that have 2 grams of fat or less. Good cooking oils are canola and olive oils.  Practice low-fat cooking techniques. Do not fry food. Instead, broil, bake, boil, steam, grill, roast on a rack, stir-fry, or microwave it. Other fat reducing suggestions include:  Remove the skin from poultry.  Remove all visible fat from meats.  Skim the fat off stews, soups, and gravies before serving them.  Steam vegetables in water or broth instead of sauting them in fat.  Avoid foods with trans fat (or hydrogenated oils), such as commercially fried foods and commercially baked goods. Commercial shortening and deep-frying fats will contain trans fat.  Increase intake of fruits, vegetables, whole grains, and legumes to replace foods high in fat.  Increase consumption of nuts, legumes, and seeds to at least 4 servings weekly. One serving of a legume equals  cup, and 1 serving of nuts or seeds equals  cup.  Choose  whole grains more often. Have 3 servings per day (a serving is 1 ounce [oz]).  Eat 4 to 5 servings of vegetables per day. A serving of vegetables is 1 cup of raw leafy vegetables;  cup of raw or cooked cut-up vegetables;  cup of vegetable juice.  Eat 4 to 5 servings of fruit per day. A serving of fruit is 1 medium whole fruit;  cup of dried fruit;  cup of fresh, frozen, or canned fruit;  cup of 100% fruit juice.  Increase your intake of dietary fiber to 20 to 30 grams per day. Insoluble fiber may help lower your risk of heart disease and may help curb your appetite.  Soluble fiber binds cholesterol to be removed from the blood. Foods high in soluble fiber are dried beans, citrus fruits, oats, apples, bananas, broccoli, Brussels sprouts, and eggplant.  Try to include foods fortified with plant sterols or stanols, such as yogurt, breads, juices, or margarines. Choose several fortified foods to achieve a daily intake of 2 to 3 grams of plant sterols or stanols.  Foods with omega-3 fats can help reduce your risk of heart disease. Aim to have a 3.5 oz portion of fatty fish twice per week, such as salmon, mackerel, albacore tuna, sardines, lake trout, or herring. If you wish to take a fish oil supplement, choose one that contains 1 gram of both DHA and EPA.  Limit processed meats to 2 servings (3 oz portion) weekly.  Limit the sodium in your diet to 1500 milligrams (mg) per day. If you have high blood pressure, talk to a registered dietitian about a DASH (Dietary Approaches to Stop Hypertension) eating plan.  Limit sweets and beverages with added sugar, such as soda, to no more than 5 servings per week. One serving is:   1 tablespoon sugar.  1 tablespoon jelly or jam.   cup sorbet.  1 cup lemonade.   cup regular soda. CHOOSING FOODS Starches  Allowed: Breads: All kinds (wheat, rye, raisin, white, oatmeal, [place], [place], and English muffin bread). Low-fat rolls: English muffins,  frankfurter and hamburger buns, bagels, pita bread, tortillas (not fried). Pancakes, waffles, biscuits, and muffins made with recommended oil.  Avoid: Products made with saturated or trans fats, oils, or whole milk products. Butter rolls, cheese breads, croissants. Commercial doughnuts, muffins, sweet rolls, biscuits, waffles, pancakes, store-bought mixes. Crackers  Allowed: Low-fat crackers and snacks: Animal, graham, rye, saltine (with recommended oil, no lard), oyster, and matzo crackers. Bread sticks, melba toast, rusks, flatbread, pretzels, and light popcorn.  Avoid: High-fat crackers: cheese crackers, butter crackers, and those made with coconut, palm oil, or trans fat (hydrogenated oils). Buttered popcorn. Cereals  Allowed: Hot or cold whole-grain cereals.  Avoid: Cereals containing coconut, hydrogenated vegetable fat, or animal fat. Potatoes / Pasta / Rice  Allowed: All kinds of potatoes, rice, and pasta (such as macaroni, spaghetti, and noodles).  Avoid: Pasta or rice prepared with cream sauce or high-fat cheese. Chow mein noodles, [place] fries. Vegetables  Allowed: All vegetables and vegetable juices.  Avoid: Fried vegetables. Vegetables in cream, butter, or high-fat cheese sauces. Limit coconut. Fruit in cream or custard. Protein  Allowed: Limit your intake of meat, seafood, and poultry to no more than 6 oz (cooked weight) per day. All lean, well-trimmed beef, veal, pork, and lamb. All chicken and [place] without skin. All fish and shellfish. Wild game: wild duck, rabbit, pheasant, and venison. Egg whites or low-cholesterol egg substitutes may be used as desired. Meatless dishes: recipes with dried beans, peas, lentils, and tofu (soybean curd). Seeds and nuts: all seeds and most nuts.  Avoid: Prime grade and other heavily marbled and fatty meats, such as short ribs, spare ribs, rib eye roast or steak, frankfurters, sausage, bacon, and high-fat luncheon meats, mutton. Caviar.  Commercially fried fish. Domestic duck, goose, venison sausage. Organ meats: liver, gizzard, heart, chitterlings, brains, kidney, sweetbreads. Dairy  Allowed: Low-fat cheeses: nonfat or low-fat cottage cheese (1% or 2% fat), cheeses made with part skim milk, such as mozzarella, farmers, string, or ricotta. (Cheeses should be labeled no more than 2 to 6 grams fat per oz.). Skim (or 1%) milk: liquid, powdered, or evaporated. Buttermilk made with low-fat milk. Drinks made with skim or low-fat milk or cocoa. Chocolate milk or cocoa made with skim or low-fat (1%) milk. Nonfat or low-fat yogurt.  Avoid: Whole milk cheeses, including colby, cheddar, [place], [address], [place], [place], [place], [address], [place], and blue. Creamed cottage cheese, cream cheese. Whole milk and whole milk products, including buttermilk or yogurt made from whole milk, drinks made from whole milk. Condensed milk, evaporated whole milk, and 2% milk. Soups and Combination Foods  Allowed: Low-fat low-sodium soups: broth, dehydrated soups, homemade broth, soups with the fat removed, homemade cream soups made with skim or low-fat milk. Low-fat spaghetti, lasagna, chili, and Spanish rice if low-fat ingredients and low-fat cooking techniques are used.  Avoid: Cream soups made with whole milk, cream, or high-fat cheese. All other soups.  Desserts and Sweets  Allowed: Sherbet, fruit ices, gelatins, meringues, and angel food cake. Homemade desserts with recommended fats, oils, and milk products. Jam, jelly, honey, marmalade, sugars, and syrups. Pure sugar candy, such as gum drops, hard candy, jelly beans, marshmallows, mints, and small amounts of dark chocolate.  Avoid: Commercially prepared cakes, pies, cookies, frosting, pudding, or mixes for these products. Desserts containing whole milk products, chocolate, coconut, lard, palm oil, or palm kernel oil. Ice cream or ice cream drinks. Candy that contains chocolate, coconut, butter,  hydrogenated fat, or unknown ingredients. Buttered syrups. Fats and Oils  Allowed: Vegetable oils: safflower, sunflower, corn, soybean, cottonseed, sesame, canola, olive, or peanut. Non-hydrogenated margarines. Salad dressing or mayonnaise: homemade or commercial, made with a recommended oil. Low or nonfat salad dressing or mayonnaise.  Limit added fats and oils to 6 to 8 tsp per day (includes fats used in cooking, baking, salads, and spreads on bread). Remember to count the "hidden fats" in foods.  Avoid: Solid fats and shortenings: butter, lard, salt pork, bacon drippings. Gravy containing meat fat, shortening, or suet. Cocoa butter, coconut. Coconut oil, palm oil, palm kernel oil, or hydrogenated oils: these ingredients are often used in bakery products, nondairy creamers, whipped toppings, candy, and commercially fried foods. Read labels carefully. Salad dressings made of unknown oils, sour cream, or cheese, such as blue cheese and Roquefort. Cream, all kinds: half-and-half, light, heavy, or whipping. Sour cream or cream cheese (even if "light" or low-fat). Nondairy cream substitutes: coffee creamers and sour cream substitutes made with palm, palm kernel, hydrogenated oils, or coconut oil. Beverages  Allowed: Coffee (regular or decaffeinated), tea. Diet carbonated beverages, mineral water. Alcohol: Check with your caregiver. Moderation is recommended.  Avoid: Whole milk, regular sodas, and juice drinks with added sugar. Condiments  Allowed: All seasonings and condiments. Cocoa powder. "Cream" sauces made with recommended ingredients.  Avoid: Carob powder made with hydrogenated fats. SAMPLE MENU Breakfast   cup orange juice   cup oatmeal  1 slice toast  1 tsp margarine  1 cup skim milk Lunch  [place] sandwich with 2 oz [place], 2 slices bread  Lettuce and tomato slices  Fresh fruit  Carrot sticks  Coffee or tea Snack  Fresh fruit or low-fat crackers Dinner  3 oz lean  ground beef  1 baked potato  1 tsp margarine   cup asparagus  Lettuce salad  1 tbs non-creamy dressing   cup peach slices  1 cup skim milk Document Released: 03/22/2008 Document Revised: 12/13/2011 Document Reviewed: 08/13/2013 [employer] Patient Information 2015 [employer]. This information is not intended to replace advice given to you by your health care provider. Make sure you discuss any questions you have with your health care provider.

## 2015-04-27 ENCOUNTER — Ambulatory Visit: Payer: Medicaid Other | Admitting: Nurse Practitioner

## 2015-09-25 ENCOUNTER — Encounter (HOSPITAL_COMMUNITY): Payer: Self-pay

## 2015-09-25 ENCOUNTER — Emergency Department (HOSPITAL_COMMUNITY)
Admission: EM | Admit: 2015-09-25 | Discharge: 2015-09-26 | Disposition: A | Discharge status: Home / Self Care | Payer: Medicaid Other | Source: Home / Self Care | Attending: Emergency Medicine | Admitting: Emergency Medicine

## 2015-09-25 DIAGNOSIS — Z880 Allergy status to penicillin: Secondary | ICD-10-CM | POA: Insufficient documentation

## 2015-09-25 DIAGNOSIS — Z79899 Other long term (current) drug therapy: Secondary | ICD-10-CM

## 2015-09-25 DIAGNOSIS — N390 Urinary tract infection, site not specified: Secondary | ICD-10-CM | POA: Insufficient documentation

## 2015-09-25 DIAGNOSIS — R0981 Nasal congestion: Secondary | ICD-10-CM

## 2015-09-25 DIAGNOSIS — N200 Calculus of kidney: Secondary | ICD-10-CM | POA: Insufficient documentation

## 2015-09-25 DIAGNOSIS — N189 Chronic kidney disease, unspecified: Secondary | ICD-10-CM

## 2015-09-25 DIAGNOSIS — R509 Fever, unspecified: Secondary | ICD-10-CM

## 2015-09-25 DIAGNOSIS — Z3202 Encounter for pregnancy test, result negative: Secondary | ICD-10-CM | POA: Insufficient documentation

## 2015-09-25 LAB — BASIC METABOLIC PANEL
Anion gap: 10 (ref 5–15)
BUN: 14 mg/dL (ref 6–20)
CALCIUM: 8.8 mg/dL — AB (ref 8.9–10.3)
CO2: 26 mmol/L (ref 22–32)
Chloride: 103 mmol/L (ref 101–111)
Creatinine, Ser: 0.94 mg/dL (ref 0.44–1.00)
GFR calc Af Amer: >60 mL/min (ref >60)
GFR calc non Af Amer: >60 mL/min (ref >60)
GLUCOSE: 114 mg/dL — AB (ref 65–99)
Potassium: 3.4 mmol/L — ABNORMAL LOW (ref 3.5–5.1)
SODIUM: 139 mmol/L (ref 135–145)

## 2015-09-25 LAB — URINALYSIS, ROUTINE W REFLEX MICROSCOPIC
BILIRUBIN URINE: NEGATIVE
GLUCOSE, UA: NEGATIVE mg/dL
KETONES UR: NEGATIVE mg/dL
Nitrite: NEGATIVE
Protein, ur: NEGATIVE mg/dL
Specific Gravity, Urine: 1.013 (ref 1.005–1.030)
pH: 6 (ref 5.0–8.0)

## 2015-09-25 LAB — CBC WITH DIFFERENTIAL/PLATELET
BASOS ABS: 0 10*3/uL (ref 0.0–0.1)
Basophils Relative: 0 %
Eosinophils Absolute: 0.1 10*3/uL (ref 0.0–0.7)
Eosinophils Relative: 1 %
HCT: 38.2 % (ref 36.0–46.0)
Hemoglobin: 12.2 g/dL (ref 12.0–15.0)
LYMPHS PCT: 10 %
Lymphs Abs: 1.2 10*3/uL (ref 0.7–4.0)
MCH: 25.7 pg — AB (ref 26.0–34.0)
MCHC: 31.9 g/dL (ref 30.0–36.0)
MCV: 80.6 fL (ref 78.0–100.0)
MONO ABS: 0.9 10*3/uL (ref 0.1–1.0)
Monocytes Relative: 7 %
Neutro Abs: 10.4 10*3/uL — ABNORMAL HIGH (ref 1.7–7.7)
Neutrophils Relative %: 82 %
PLATELETS: 215 10*3/uL (ref 150–400)
RBC: 4.74 MIL/uL (ref 3.87–5.11)
RDW: 13.8 % (ref 11.5–15.5)
WBC: 12.6 10*3/uL — ABNORMAL HIGH (ref 4.0–10.5)

## 2015-09-25 LAB — URINE MICROSCOPIC-ADD ON

## 2015-09-25 LAB — I-STAT BETA HCG BLOOD, ED (MC, WL, AP ONLY)

## 2015-09-25 MED ORDER — PROMETHAZINE HCL 25 MG PO TABS: 25.0000 mg | ORAL_TABLET | Freq: Four times a day (QID) | ORAL | Status: DC | PRN

## 2015-09-25 MED ORDER — ACETAMINOPHEN 325 MG PO TABS
650.0000 mg | ORAL_TABLET | Freq: Once | ORAL | Status: AC | PRN
  Administered 2015-09-25: 650 mg via ORAL
  Filled 2015-09-25: qty 2

## 2015-09-25 MED ORDER — CEPHALEXIN 500 MG PO CAPS: 500.0000 mg | ORAL_CAPSULE | Freq: Three times a day (TID) | ORAL | Status: DC

## 2015-09-25 NOTE — ED Notes (Signed)
Pt dx with 3 mm kidney stone yesterday.  Pt told to come to ED for fever greater than 101.  Pt tempt today at home 101.2.

## 2015-09-25 NOTE — ED Provider Notes (Signed)
CSN: [id number]     Arrival date & time 09/25/15  1752 History   First MD Initiated Contact with Patient 09/25/15 2024     Chief Complaint  Patient presents with  . Nephrolithiasis  . Fever     (Consider location/radiation/quality/duration/timing/severity/associated sxs/prior Treatment) HPI Comments: Diagnosed at [facility] yesterday with 3mm renal stone on XR Wednesday night started having pain on right side Hx of nephrolithiasis, passed one, had ureteroscopy Severe pain right flank/back N/V Pain is now under control, no significant pain at this time  No fevers initially, however fevers started today No dysuria On menses now No other vaginal discharge Hydrocodone 7.5, promethazine for nausea, flomax  Right flank pain Husband had congestion/head cold/duaghter had it Little bit of nasal congestion, no cough, no sore throat Mild body aches, thought with fever Mild urinary frequency consistent with prior nephrolithiasis   Patient is a 30 y.o. female presenting with fever.  Fever Associated symptoms: congestion, myalgias, nausea and vomiting   Associated symptoms: no chest pain, no cough, no diarrhea, no dysuria, no headaches, no rash and no sore throat     Past Medical History  Diagnosis Date  . Syncope 03/20/12  . Left ureteral stone   . H/O vaginal delivery 05/2012    x1  . Chronic kidney disease     left ureteral stone   Past Surgical History  Procedure Laterality Date  . Wisdom tooth extraction  03/2014  . No past surgeries    . Cystoscopy with retrograde pyelogram, ureteroscopy and stent placement Left 09/09/2014    Procedure: CYSTOSCOPY WITH LEFT RETROGRADE PYELOGRAM, URETEROSCOPY AND STONE EXTRACTION  DOUBLE J STENT PLACEMENT;  Surgeon: Jerilee FieldMatthew Eskridge, MD;  Location: [facility];  Service: Urology;  Laterality: Left;  . Holmium laser application Left 09/09/2014    Procedure: HOLMIUM LASER APPLICATION;  Surgeon: Jerilee FieldMatthew Eskridge, MD;  Location: [facility];  Service:  Urology;  Laterality: Left;   Family History  Problem Relation Age of Onset  . Hypertension Mother   . Diabetes Father   . Kidney disease Father    Social History  Substance Use Topics  . Smoking status: Never Smoker   . Smokeless tobacco: Never Used  . Alcohol Use: No   OB History    Gravida Para Term Preterm AB TAB SAB Ectopic Multiple Living   1 1 1       1      Review of Systems  Constitutional: Positive for fever.  HENT: Positive for congestion. Negative for sore throat.   Eyes: Negative for visual disturbance.  Respiratory: Negative for cough and shortness of breath.   Cardiovascular: Negative for chest pain.  Gastrointestinal: Positive for nausea and vomiting. Negative for abdominal pain, diarrhea and constipation.  Genitourinary: Positive for frequency and flank pain. Negative for dysuria and difficulty urinating.  Musculoskeletal: Positive for myalgias. Negative for back pain and neck pain.  Skin: Negative for rash.  Neurological: Negative for syncope and headaches.      Allergies  Amoxicillin  Home Medications   Prior to Admission medications   Medication Sig Start Date End Date Taking? Authorizing Provider  cetirizine (ZYRTEC) 10 MG tablet Take 1 tablet (10 mg total) by mouth daily. 12/16/14  Yes Tiffany A Gann, PA-C  HYDROcodone-acetaminophen (NORCO) 7.5-325 MG tablet Take 1-2 tablets by mouth every 6 (six) hours as needed for moderate pain or severe pain.   Yes Historical Provider, MD  ibuprofen (ADVIL,MOTRIN) 200 MG tablet Take 400-800 mg by mouth every 6 (six) hours as  needed for mild pain or moderate pain.   Yes Historical Provider, MD  acetaminophen (TYLENOL) 500 MG tablet Take 1,000 mg by mouth every 6 (six) hours as needed for mild pain.    Historical Provider, MD  cephALEXin (KEFLEX) 500 MG capsule Take 1 capsule (500 mg total) by mouth 3 (three) times daily. 09/25/15 10/09/15  Alvira Monday, MD  fluticasone (FLONASE) 50 MCG/ACT nasal spray Place 2  sprays into both nostrils daily. Patient taking differently: Place 2 sprays into both nostrils daily as needed for allergies.  12/16/14   Tiffany A Gann, PA-C  promethazine (PHENERGAN) 25 MG tablet Take 1 tablet (25 mg total) by mouth every 6 (six) hours as needed for nausea or vomiting. 09/25/15   Alvira Monday, MD   BP 136/73 mmHg  Pulse 115  Temp(Src) 103 F (39.4 C) (Oral)  Resp 18  SpO2 100% Physical Exam  Constitutional: She is oriented to person, place, and time. She appears well-developed and well-nourished. No distress.  HENT:  Head: Normocephalic and atraumatic.  Eyes: Conjunctivae and EOM are normal.  Neck: Normal range of motion.  Cardiovascular: Normal rate, regular rhythm, normal heart sounds and intact distal pulses.  Exam reveals no gallop and no friction rub.   No murmur heard. Pulmonary/Chest: Effort normal and breath sounds normal. No respiratory distress. She has no wheezes. She has no rales.  Abdominal: Soft. She exhibits no distension. There is no tenderness. There is no guarding.  No CVA tenderness  Musculoskeletal: She exhibits no edema or tenderness.  Neurological: She is alert and oriented to person, place, and time.  Skin: Skin is warm and dry. No rash noted. She is not diaphoretic. No erythema.  Nursing note and vitals reviewed.   ED Course  Procedures (including critical care time) Labs Review Labs Reviewed  CBC WITH DIFFERENTIAL/PLATELET - Abnormal; Notable for the following:    WBC 12.6 (*)    MCH 25.7 (*)    Neutro Abs 10.4 (*)    All other components within normal limits  BASIC METABOLIC PANEL - Abnormal; Notable for the following:    Potassium 3.4 (*)    Glucose, Bld 114 (*)    Calcium 8.8 (*)    All other components within normal limits  URINALYSIS, ROUTINE W REFLEX MICROSCOPIC (NOT AT [facility]) - Abnormal; Notable for the following:    Hgb urine dipstick MODERATE (*)    Leukocytes, UA TRACE (*)    All other components within normal limits   URINE MICROSCOPIC-ADD ON - Abnormal; Notable for the following:    Squamous Epithelial / LPF 6-30 (*)    Bacteria, UA FEW (*)    All other components within normal limits  URINE CULTURE  I-STAT BETA HCG BLOOD, ED ([facility], [facility], [facility] ONLY)    Imaging Review No results found. I have personally reviewed and evaluated these images and lab results as part of my medical decision-making.   EKG Interpretation None      MDM   Final diagnoses:  Fever, unspecified fever cause  UTI (lower urinary tract infection)  Nephrolithiasis   29yo female with history of nephrolithiasis presents with concern for fever after being diagnosed with 3mm right sided nephrolithiasis yesterday.  Patient had pain typical of prior nephrolithiasis and went to [facility] yesterday and was diagnosed with 3mm stone on XR and started on symptomatic treatment. Her pain is now controlled and not worsening, and pt is in no distress, with no abdominal or CVA tenderness and doubt perinephric abscess,  appendicitis, cholecystitis, PID.  She is initially with very mild tachycaria, otherwise normal vital signs and well appearing.With elevation of temperature to 103, HR increased, which is likely secondary to fever, and after receiving tylenol, pt improved on my evaluation, with improved HR, normal BP. GIven toradol for additional control of myalgias.  No sign of pneumonia, no sign of meningitis.   Urinalysis borderline for UTI, and given fever will treat for UTI with keflex, send culture, and have pt follow up with Urology next week. Given pain is under control, do not feel further evaluation of nephrolithiasis/obstruction is indicated at this time. Discussed this with Urologist on call.  Patient is reliable without other medical problems and will return if pain or symptoms worsen.  Fever may also be secondary to viral or other influenza-like illness given nasal congestion, sick contacts, fever and myalgias.  Pt does not have other  medical problems to make her a tamiflu candidate.   Discussed need for supportive care, use of keflex for UTI, close Urology follow up, and return if symptoms worsen.   Alvira Monday, MD 09/26/15 1246

## 2015-09-25 NOTE — ED Notes (Signed)
Unsuccessful lab draw by this Clinical research associatewriter and triage nurse S. Shelva MajesticWest.

## 2015-09-27 ENCOUNTER — Emergency Department (HOSPITAL_COMMUNITY): Payer: Medicaid Other

## 2015-09-27 ENCOUNTER — Emergency Department (HOSPITAL_COMMUNITY): Payer: Medicaid Other | Admitting: Anesthesiology

## 2015-09-27 ENCOUNTER — Encounter (HOSPITAL_COMMUNITY)
Admission: EM | Disposition: A | Discharge status: Home / Self Care | Payer: Self-pay | Source: Home / Self Care | Attending: Internal Medicine

## 2015-09-27 ENCOUNTER — Inpatient Hospital Stay (HOSPITAL_COMMUNITY)
Admission: EM | Admit: 2015-09-27 | Discharge: 2015-09-29 | DRG: 872 | Disposition: A | Discharge status: Home / Self Care | Payer: Medicaid Other | Attending: Internal Medicine | Admitting: Internal Medicine

## 2015-09-27 ENCOUNTER — Encounter (HOSPITAL_COMMUNITY): Payer: Self-pay | Admitting: *Deleted

## 2015-09-27 DIAGNOSIS — Z841 Family history of disorders of kidney and ureter: Secondary | ICD-10-CM

## 2015-09-27 DIAGNOSIS — N10 Acute tubulo-interstitial nephritis: Secondary | ICD-10-CM

## 2015-09-27 DIAGNOSIS — R509 Fever, unspecified: Secondary | ICD-10-CM

## 2015-09-27 DIAGNOSIS — N3010 Interstitial cystitis (chronic) without hematuria: Secondary | ICD-10-CM | POA: Insufficient documentation

## 2015-09-27 DIAGNOSIS — Z833 Family history of diabetes mellitus: Secondary | ICD-10-CM | POA: Diagnosis not present

## 2015-09-27 DIAGNOSIS — A419 Sepsis, unspecified organism: Secondary | ICD-10-CM | POA: Diagnosis present

## 2015-09-27 DIAGNOSIS — N390 Urinary tract infection, site not specified: Secondary | ICD-10-CM | POA: Diagnosis present

## 2015-09-27 DIAGNOSIS — D72829 Elevated white blood cell count, unspecified: Secondary | ICD-10-CM | POA: Diagnosis not present

## 2015-09-27 DIAGNOSIS — N202 Calculus of kidney with calculus of ureter: Secondary | ICD-10-CM | POA: Diagnosis present

## 2015-09-27 DIAGNOSIS — N200 Calculus of kidney: Secondary | ICD-10-CM

## 2015-09-27 DIAGNOSIS — Z8249 Family history of ischemic heart disease and other diseases of the circulatory system: Secondary | ICD-10-CM | POA: Diagnosis not present

## 2015-09-27 DIAGNOSIS — R55 Syncope and collapse: Secondary | ICD-10-CM | POA: Diagnosis present

## 2015-09-27 DIAGNOSIS — Z87442 Personal history of urinary calculi: Secondary | ICD-10-CM | POA: Diagnosis not present

## 2015-09-27 DIAGNOSIS — N12 Tubulo-interstitial nephritis, not specified as acute or chronic: Secondary | ICD-10-CM | POA: Diagnosis not present

## 2015-09-27 DIAGNOSIS — N189 Chronic kidney disease, unspecified: Secondary | ICD-10-CM | POA: Diagnosis present

## 2015-09-27 DIAGNOSIS — N136 Pyonephrosis: Secondary | ICD-10-CM | POA: Diagnosis present

## 2015-09-27 DIAGNOSIS — Z880 Allergy status to penicillin: Secondary | ICD-10-CM | POA: Diagnosis not present

## 2015-09-27 DIAGNOSIS — N2883 Nephroptosis: Secondary | ICD-10-CM | POA: Diagnosis present

## 2015-09-27 HISTORY — DX: Acute pyelonephritis: N10

## 2015-09-27 HISTORY — PX: CYSTOSCOPY W/ URETERAL STENT PLACEMENT: SHX1429

## 2015-09-27 LAB — URINE MICROSCOPIC-ADD ON

## 2015-09-27 LAB — URINALYSIS, ROUTINE W REFLEX MICROSCOPIC
Glucose, UA: NEGATIVE mg/dL
NITRITE: NEGATIVE
PROTEIN: 100 mg/dL — AB
Specific Gravity, Urine: 1.028 (ref 1.005–1.030)
pH: 6 (ref 5.0–8.0)

## 2015-09-27 LAB — COMPREHENSIVE METABOLIC PANEL
ALK PHOS: 81 U/L (ref 38–126)
ALT: 106 U/L — ABNORMAL HIGH (ref 14–54)
ANION GAP: 14 (ref 5–15)
AST: 78 U/L — ABNORMAL HIGH (ref 15–41)
Albumin: 3.9 g/dL (ref 3.5–5.0)
BILIRUBIN TOTAL: 0.7 mg/dL (ref 0.3–1.2)
BUN: 10 mg/dL (ref 6–20)
CALCIUM: 9.5 mg/dL (ref 8.9–10.3)
CO2: 20 mmol/L — ABNORMAL LOW (ref 22–32)
Chloride: 106 mmol/L (ref 101–111)
Creatinine, Ser: 0.88 mg/dL (ref 0.44–1.00)
GFR calc Af Amer: >60 mL/min (ref >60)
GLUCOSE: 83 mg/dL (ref 65–99)
Potassium: 3.7 mmol/L (ref 3.5–5.1)
Sodium: 140 mmol/L (ref 135–145)
Total Protein: 8.2 g/dL — ABNORMAL HIGH (ref 6.5–8.1)

## 2015-09-27 LAB — CBC WITH DIFFERENTIAL/PLATELET
BASOS PCT: 0 %
Basophils Absolute: 0 10*3/uL (ref 0.0–0.1)
EOS PCT: 0 %
Eosinophils Absolute: 0 10*3/uL (ref 0.0–0.7)
HEMATOCRIT: 44.4 % (ref 36.0–46.0)
Hemoglobin: 14.3 g/dL (ref 12.0–15.0)
Lymphocytes Relative: 8 %
Lymphs Abs: 1.7 10*3/uL (ref 0.7–4.0)
MCH: 26.3 pg (ref 26.0–34.0)
MCHC: 32.2 g/dL (ref 30.0–36.0)
MCV: 81.6 fL (ref 78.0–100.0)
MONO ABS: 1.7 10*3/uL — AB (ref 0.1–1.0)
MONOS PCT: 8 %
NEUTROS PCT: 84 %
Neutro Abs: 17.6 10*3/uL — ABNORMAL HIGH (ref 1.7–7.7)
PLATELETS: 193 10*3/uL (ref 150–400)
RBC: 5.44 MIL/uL — AB (ref 3.87–5.11)
RDW: 13.9 % (ref 11.5–15.5)
WBC: 21 10*3/uL — AB (ref 4.0–10.5)

## 2015-09-27 LAB — I-STAT CG4 LACTIC ACID, ED: Lactic Acid, Venous: 1.67 mmol/L (ref 0.5–2.0)

## 2015-09-27 LAB — POC URINE PREG, ED: Preg Test, Ur: NEGATIVE

## 2015-09-27 SURGERY — CYSTOSCOPY, WITH RETROGRADE PYELOGRAM AND URETERAL STENT INSERTION
Anesthesia: General | Laterality: Right

## 2015-09-27 MED ORDER — MORPHINE SULFATE (PF) 2 MG/ML IV SOLN
1.0000 mg | INTRAVENOUS | Status: DC | PRN
  Administered 2015-09-27: 1 mg via INTRAVENOUS
  Filled 2015-09-27: qty 1

## 2015-09-27 MED ORDER — DEXAMETHASONE SODIUM PHOSPHATE 10 MG/ML IJ SOLN
INTRAMUSCULAR | Status: DC | PRN
  Administered 2015-09-27: 10 mg via INTRAVENOUS

## 2015-09-27 MED ORDER — IBUPROFEN 400 MG PO TABS
400.0000 mg | ORAL_TABLET | Freq: Four times a day (QID) | ORAL | Status: DC | PRN
  Administered 2015-09-28: 400 mg via ORAL
  Filled 2015-09-27 (×2): qty 2

## 2015-09-27 MED ORDER — SODIUM CHLORIDE 0.9 % IV SOLN
INTRAVENOUS | Status: DC
  Administered 2015-09-28 (×3): via INTRAVENOUS

## 2015-09-27 MED ORDER — DEXTROSE 5 % IV SOLN
1.0000 g | Freq: Once | INTRAVENOUS | Status: AC
  Administered 2015-09-27: 1 g via INTRAVENOUS
  Filled 2015-09-27: qty 10

## 2015-09-27 MED ORDER — ONDANSETRON HCL 4 MG/2ML IJ SOLN
INTRAMUSCULAR | Status: DC | PRN
  Administered 2015-09-27: 4 mg via INTRAVENOUS

## 2015-09-27 MED ORDER — GENTAMICIN SULFATE 40 MG/ML IJ SOLN
280.0000 mg | INTRAVENOUS | Status: AC
  Administered 2015-09-27: 280 mg via INTRAVENOUS
  Filled 2015-09-27: qty 7

## 2015-09-27 MED ORDER — LACTATED RINGERS IV SOLN
INTRAVENOUS | Status: DC | PRN
  Administered 2015-09-27 (×2): via INTRAVENOUS

## 2015-09-27 MED ORDER — DEXTROSE 5 % IV SOLN
2.0000 g | INTRAVENOUS | Status: DC
  Administered 2015-09-28 – 2015-09-29 (×2): 2 g via INTRAVENOUS
  Filled 2015-09-27 (×2): qty 2

## 2015-09-27 MED ORDER — FENTANYL CITRATE (PF) 100 MCG/2ML IJ SOLN: 25.0000 ug | INTRAMUSCULAR | Status: DC | PRN

## 2015-09-27 MED ORDER — LIDOCAINE HCL (CARDIAC) 20 MG/ML IV SOLN
INTRAVENOUS | Status: DC | PRN
  Administered 2015-09-27: 50 mg via INTRAVENOUS

## 2015-09-27 MED ORDER — PROPOFOL 10 MG/ML IV BOLUS
INTRAVENOUS | Status: DC | PRN
  Administered 2015-09-27: 150 mg via INTRAVENOUS

## 2015-09-27 MED ORDER — IOPAMIDOL (ISOVUE-300) INJECTION 61%
INTRAVENOUS | Status: DC | PRN
  Administered 2015-09-27: 50 mL via URETHRAL

## 2015-09-27 MED ORDER — ONDANSETRON HCL 4 MG/2ML IJ SOLN
4.0000 mg | Freq: Four times a day (QID) | INTRAMUSCULAR | Status: DC | PRN
  Administered 2015-09-27: 4 mg via INTRAVENOUS
  Filled 2015-09-27: qty 2

## 2015-09-27 MED ORDER — SODIUM CHLORIDE 0.9 % IR SOLN
Status: DC | PRN
  Administered 2015-09-27: 1000 mL via INTRAVESICAL
  Administered 2015-09-27: 3000 mL via INTRAVESICAL

## 2015-09-27 MED ORDER — ACETAMINOPHEN 325 MG PO TABS
650.0000 mg | ORAL_TABLET | Freq: Once | ORAL | Status: AC
  Administered 2015-09-27: 650 mg via ORAL
  Filled 2015-09-27: qty 2

## 2015-09-27 MED ORDER — ACETAMINOPHEN 500 MG PO TABS: 1000.0000 mg | ORAL_TABLET | Freq: Four times a day (QID) | ORAL | Status: DC | PRN

## 2015-09-27 MED ORDER — FENTANYL CITRATE (PF) 100 MCG/2ML IJ SOLN
INTRAMUSCULAR | Status: DC | PRN
  Administered 2015-09-27 (×2): 50 ug via INTRAVENOUS

## 2015-09-27 MED ORDER — ONDANSETRON HCL 4 MG/2ML IJ SOLN: 4.0000 mg | Freq: Once | INTRAMUSCULAR | Status: DC | PRN

## 2015-09-27 MED ORDER — SODIUM CHLORIDE 0.9 % IV BOLUS (SEPSIS)
1000.0000 mL | Freq: Once | INTRAVENOUS | Status: AC
  Administered 2015-09-27: 1000 mL via INTRAVENOUS

## 2015-09-27 SURGICAL SUPPLY — 13 items
BAG URO CATCHER STRL LF (MISCELLANEOUS) ×3 IMPLANT
CATH INTERMIT  6FR 70CM (CATHETERS) ×3 IMPLANT
CLOTH BEACON ORANGE TIMEOUT ST (SAFETY) ×3 IMPLANT
GLOVE BIOGEL M STRL SZ7.5 (GLOVE) ×3 IMPLANT
GOWN STRL REUS W/TWL LRG LVL3 (GOWN DISPOSABLE) ×6 IMPLANT
GUIDEWIRE ANG ZIPWIRE 038X150 (WIRE) IMPLANT
GUIDEWIRE STR DUAL SENSOR (WIRE) IMPLANT
MANIFOLD NEPTUNE II (INSTRUMENTS) ×3 IMPLANT
PACK CYSTO (CUSTOM PROCEDURE TRAY) ×3 IMPLANT
STENT URET 6FRX24 CONTOUR (STENTS) ×3 IMPLANT
TRAY FOLEY METER SIL LF 16FR (CATHETERS) ×3 IMPLANT
TUBING CONNECTING 10 (TUBING) ×2 IMPLANT
TUBING CONNECTING 10' (TUBING) ×1

## 2015-09-27 NOTE — ED Notes (Signed)
Notified Diplomatic [employer] operational officersecretary to call [facility] for transport.

## 2015-09-27 NOTE — Transfer of Care (Signed)
Immediate Anesthesia Transfer of Care Note  Patient: Lauren Curlingachael D Waitman  Procedure(s) Performed: Procedure(s): CYSTOSCOPY WITH RETROGRADE PYELOGRAM/URETERAL STENT PLACEMENT (Right)  Patient Location: PACU  Anesthesia Type:General  Level of Consciousness: awake, alert  and oriented  Airway & Oxygen Therapy: Patient Spontanous Breathing and Patient connected to face mask oxygen  Post-op Assessment: Report given to RN and Post -op Vital signs reviewed and stable  Post vital signs: Reviewed and stable  Last Vitals:  Filed Vitals:   09/27/15 1700 09/27/15 1800  BP: 114/74 118/61  Pulse: 108 109  Temp:    Resp: 19 22    Complications: No apparent anesthesia complications

## 2015-09-27 NOTE — Consult Note (Signed)
Lauren CurlingRachael D Cardenas [birth year] [id number]  Referring provider: Dr Lourena Simmonds. [place]  Chief Complaint  Patient presents with  . Fever    HPI: The patient is a 30 year old female with a past medical history of nephrolithiasis who presents to the ER today with 2 right distal obstructing stones and fever. She was seen last week for her 4 mm and 2 mm right distal stones and was started on medical expulsive therapy. She returns to the ER today with a fever 103.9 at home and a leukocytosis of 21. She feels her symptoms have gotten progressively worse. She has had surgery before with ureteral stent placement.  On review of her imaging, she has a above-noted stones as well as bilateral nonobstructing renal calculi.    PMH: Past Medical History  Diagnosis Date  . Syncope 03/20/12  . Left ureteral stone   . H/O vaginal delivery 05/2012    x1  . Chronic kidney disease     left ureteral stone    Surgical History: Past Surgical History  Procedure Laterality Date  . Wisdom tooth extraction  03/2014  . No past surgeries    . Cystoscopy with retrograde pyelogram, ureteroscopy and stent placement Left 09/09/2014    Procedure: CYSTOSCOPY WITH LEFT RETROGRADE PYELOGRAM, URETEROSCOPY AND STONE EXTRACTION  DOUBLE J STENT PLACEMENT;  Surgeon: Jerilee FieldMatthew Eskridge, MD;  Location: [facility];  Service: Urology;  Laterality: Left;  . Holmium laser application Left 09/09/2014    Procedure: HOLMIUM LASER APPLICATION;  Surgeon: Jerilee FieldMatthew Eskridge, MD;  Location: [facility];  Service: Urology;  Laterality: Left;    Home Medications:    Medication List    ASK your doctor about these medications        acetaminophen 500 MG tablet  Commonly known as:  TYLENOL  Take 1,000 mg by mouth every 6 (six) hours as needed for mild pain.     cephALEXin 500 MG capsule  Commonly known as:  KEFLEX  Take 1 capsule (500 mg total) by mouth 3 (three) times daily.     cetirizine 10 MG tablet  Commonly known as:  ZYRTEC  Take 1 tablet (10  mg total) by mouth daily.     fluticasone 50 MCG/ACT nasal spray  Commonly known as:  FLONASE  Place 2 sprays into both nostrils daily.     ibuprofen 200 MG tablet  Commonly known as:  ADVIL,MOTRIN  Take 400-800 mg by mouth every 6 (six) hours as needed for mild pain or moderate pain.     NORCO 7.5-325 MG tablet  Generic drug:  HYDROcodone-acetaminophen  Take 1-2 tablets by mouth every 6 (six) hours as needed for moderate pain or severe pain.     promethazine 25 MG tablet  Commonly known as:  PHENERGAN  Take 1 tablet (25 mg total) by mouth every 6 (six) hours as needed for nausea or vomiting.        Allergies:  Allergies  Allergen Reactions  . Amoxicillin Hives    Family History: Family History  Problem Relation Age of Onset  . Hypertension Mother   . Diabetes Father   . Kidney disease Father     Social History:  reports that she has never smoked. She has never used smokeless tobacco. She reports that she does not drink alcohol or use illicit drugs.  ROS: 12 point ROS negative except for above  Physical Exam: BP 112/66 mmHg  Pulse 106  Temp(Src) 103 F (39.4 C) (Oral)  Resp 20  Ht  (1.549 m)  Wt 166 lb 5 oz (75.439 kg)  BMI 31.44 kg/m2  SpO2 100%  LMP 09/21/2015  Constitutional:  Alert and oriented, No acute distress. HEENT:  AT, moist mucus membranes.  Trachea midline, no masses. Cardiovascular: No clubbing, cyanosis, or edema. Respiratory: Normal respiratory effort, no increased work of breathing. GI: Abdomen is soft, nontender, nondistended, no abdominal masses GU: Right CVA tenderness Skin: No rashes, bruises or suspicious lesions. Lymph: No cervical or inguinal adenopathy. Neurologic: Grossly intact, no focal deficits, moving all 4 extremities. Psychiatric: Normal mood and affect.  Laboratory Data: Lab Results  Component Value Date   WBC 21.0* 09/27/2015   HGB 14.3 09/27/2015    HCT 44.4 09/27/2015   MCV 81.6 09/27/2015   PLT 193 09/27/2015    Lab Results  Component Value Date   CREATININE 0.88 09/27/2015    No results found for: PSA  No results found for: TESTOSTERONE  No results found for: HGBA1C  Urinalysis    Component Value Date/Time   COLORURINE AMBER* 09/27/2015 1210   APPEARANCEUR CLOUDY* 09/27/2015 1210   LABSPEC 1.028 09/27/2015 1210   PHURINE 6.0 09/27/2015 1210   GLUCOSEU NEGATIVE 09/27/2015 1210   HGBUR TRACE* 09/27/2015 1210   BILIRUBINUR SMALL* 09/27/2015 1210   BILIRUBINUR neg 06/12/2014 1536   KETONESUR >80* 09/27/2015 1210   PROTEINUR 100* 09/27/2015 1210   PROTEINUR neg 06/12/2014 1536   UROBILINOGEN negative 06/12/2014 1536   UROBILINOGEN 1.0 03/20/2012 2124   NITRITE NEGATIVE 09/27/2015 1210   NITRITE neg 06/12/2014 1536   LEUKOCYTESUR SMALL* 09/27/2015 1210    Pertinent Imaging: CLINICAL DATA: RIGHT renal calculus by recent radiograph at urology office on Thursday, RIGHT flank pain, fever  EXAM: CT ABDOMEN AND PELVIS WITHOUT CONTRAST  TECHNIQUE: Multidetector CT imaging of the abdomen and pelvis was performed following the standard protocol without IV contrast. Sagittal and coronal MPR images reconstructed from axial data set. Oral contrast not administered for this indication.  COMPARISON: 09/02/2014  FINDINGS: Lower chest: Lung bases clear.  Hepatobiliary: Liver and gallbladder grossly unremarkable. No biliary dilatation.  Pancreas: Normal appearance  Spleen: Normal appearance  Adrenals/Urinary Tract: Normal adrenal glands. Nonobstructing calculus inferior pole LEFT kidney 4 mm diameter. Nonobstructing RIGHT renal calculi largest 5 mm diameter. Mild perinephric edema RIGHT kidney. RIGHT hydronephrosis and hydroureter. Distal RIGHT ureteral calculi identified 2 mm image 75. RIGHT UVJ calculus 4 mm image 77. LEFT ureter and bladder unremarkable.  Stomach/Bowel: Normal appendix. Stomach and  bowel loops otherwise grossly unremarkable for technique.  Vascular/Lymphatic: No vascular abnormalities or adenopathy  Reproductive: Unremarkable uterus and adnexa.  Other: No mass, free air, free fluid or hernia.  Musculoskeletal: No acute abnormalities  IMPRESSION: RIGHT hydronephrosis and hydroureter secondary to a 4 mm calculus at the RIGHT ureterovesical junction with an additional 2 mm distal RIGHT ureteral calculus also noted.  BILATERAL nonobstructing renal calculi as well  Assessment & Plan:    I discussed the patient the need for emergent intervention to drain her right kidney due to a obstructing ureteral stone in the setting of fever, the [place] ptosis, and UTI. She understands the goal today is solely to drain her right kidney with the right ureteral stent. She understands that we will need to address her stone burden at a later time once her infection has resolved. She understands the risks, benefits, indications of cystoscopy, right retrograde pyelogram,  right ureteral stent placement.  1. Right distal stone 2. Bilateral nephrolithiasis 3. UTI 4. Sepsis -cystoscopy, right retrograde pyelogram, right ureteral stent placement    Hildred Laser, MD

## 2015-09-27 NOTE — ED Notes (Signed)
[facility] to [facility] OR holding

## 2015-09-27 NOTE — Progress Notes (Signed)
Pharmacy Antibiotic Note  Lauren CurlingRachael D Cardenas is a 30 y.o. female admitted on 09/27/2015 with pyelonephritis.  Pharmacy has been consulted for ceftriaxone dosing.  Plan: Ceftriaxone 1g x 1 in ED, then ceftriaxone 2g q24h F/u renal function, clinical course  Height: 5\' 1"  (154.9 cm) Weight: 166 lb 5 oz (75.439 kg) IBW/kg (Calculated) : 47.8  Temp (24hrs), Avg:100.8 F (38.2 C), Min:99 F (37.2 C), Max:103 F (39.4 C)   Recent Labs Lab 09/25/15 1816 09/27/15 1214 09/27/15 1227  WBC 12.6*  --  21.0*  CREATININE 0.94  --  0.88  LATICACIDVEN  --  1.67  --     Estimated Creatinine Clearance: 86 mL/min (by C-G formula based on Cr of 0.88).    Allergies  Allergen Reactions  . Amoxicillin Hives    Antimicrobials this admission: 04/02 ceftriaxone-->  Microbiology results: 04/02 blood cx x 2--> sent  Thank you for allowing pharmacy to be a part of this patient's care.  Lauren PinkGazda, Lauren Cardenas, PharmD 09/27/2015 5:08 PM

## 2015-09-27 NOTE — Anesthesia Postprocedure Evaluation (Signed)
Anesthesia Post Note  Patient: Lauren Cardenas  Procedure(s) Performed: Procedure(s) (LRB): CYSTOSCOPY WITH RETROGRADE PYELOGRAM/URETERAL STENT PLACEMENT (Right)  Patient location during evaluation: PACU Anesthesia Type: General Level of consciousness: awake and alert Pain management: pain level controlled Vital Signs Assessment: post-procedure vital signs reviewed and stable Respiratory status: spontaneous breathing, nonlabored ventilation, respiratory function stable and patient connected to nasal cannula oxygen Cardiovascular status: blood pressure returned to baseline and stable Postop Assessment: no signs of nausea or vomiting Anesthetic complications: no    Last Vitals:  Filed Vitals:   09/27/15 2002 09/27/15 2021  BP:  130/84  Pulse: 106 108  Temp:  37.3 C  Resp: 19 18    Last Pain:  Filed Vitals:   09/27/15 2022  PainSc: 5                  Cecile HearingStephen Edward Turk

## 2015-09-27 NOTE — Anesthesia Procedure Notes (Signed)
Procedure Name: LMA Insertion Date/Time: 09/27/2015 6:59 PM Performed by: Thornell MuleSTUBBLEFIELD, Mattison Stuckey G Pre-anesthesia Checklist: Patient identified, Emergency Drugs available, Suction available and Patient being monitored Patient Re-evaluated:Patient Re-evaluated prior to inductionOxygen Delivery Method: Circle system utilized Preoxygenation: Pre-oxygenation with 100% oxygen Intubation Type: IV induction LMA: LMA inserted LMA Size: 4.0 Number of attempts: 1 Tube secured with: Tape Dental Injury: Teeth and Oropharynx as per pre-operative assessment

## 2015-09-27 NOTE — ED Notes (Signed)
Pt in c/o fever, [facility], pt dx of UTI & pt went home with Cephalexin 500 mg tabs, pt reports taking meds yesterday, pt reports temp of 103.9 at home this morning, pt c/o nausea, chills, & generalized body aches, A&O x4

## 2015-09-27 NOTE — ED Notes (Signed)
Notified Rose, PA of 103 temp.

## 2015-09-27 NOTE — ED Provider Notes (Signed)
CSN: [id number]     Arrival date & time 09/27/15  4098 History   First MD Initiated Contact with Patient 09/27/15 1116     Chief Complaint  Patient presents with  . Fever     (Consider location/radiation/quality/duration/timing/severity/associated sxs/prior Treatment) Patient is a 30 y.o. female presenting with fever. The history is provided by the patient.  Fever Associated symptoms: chills, headaches, myalgias and nausea   Associated symptoms: no chest pain, no confusion, no congestion, no cough, no diarrhea, no dysuria, no rhinorrhea, no sore throat and no vomiting    Lauren Cardenas is a 30 y.o. female with PMH significant for nephrolithiasis who presents with fever, Tmax 103.9 earlier this morning along with generalized body aches, nausea, and chills.  Denies cough, SOB, sore throat, flank pain, abdominal pain, urinary symptoms, neck pain/stiffness.  Patient took 400 mg ibuprofen approximately 7:30 AM, and is afebrile upon arrival to ED. Her symptoms are sudden onset and waxing/waning.   Patient was dxed with 3mm renal stone by XR 09/24/15 (3 days ago) by [facility] and sent home with Hydrocodone, Promethazine, and Flomax.  She then presented to [facility] ED 09/25/15 for fever and generalized body aches.  Labs largely unremarkable with mild leukocytosis of 12.6 and borderline UA.  She was sent home with Keflex.  Urine culture pending.      Past Medical History  Diagnosis Date  . Syncope 03/20/12  . Left ureteral stone   . H/O vaginal delivery 05/2012    x1  . Chronic kidney disease     left ureteral stone   Past Surgical History  Procedure Laterality Date  . Wisdom tooth extraction  03/2014  . No past surgeries    . Cystoscopy with retrograde pyelogram, ureteroscopy and stent placement Left 09/09/2014    Procedure: CYSTOSCOPY WITH LEFT RETROGRADE PYELOGRAM, URETEROSCOPY AND STONE EXTRACTION  DOUBLE J STENT PLACEMENT;  Surgeon: Jerilee Field, MD;  Location: [facility];  Service:  Urology;  Laterality: Left;  . Holmium laser application Left 09/09/2014    Procedure: HOLMIUM LASER APPLICATION;  Surgeon: Jerilee Field, MD;  Location: [facility];  Service: Urology;  Laterality: Left;   Family History  Problem Relation Age of Onset  . Hypertension Mother   . Diabetes Father   . Kidney disease Father    Social History  Substance Use Topics  . Smoking status: Never Smoker   . Smokeless tobacco: Never Used  . Alcohol Use: No   OB History    Gravida Para Term Preterm AB TAB SAB Ectopic Multiple Living   Review of Systems  Constitutional: Positive for fever and chills.  HENT: Negative for congestion, rhinorrhea and sore throat.   Respiratory: Negative for cough and shortness of breath.   Cardiovascular: Negative for chest pain.  Gastrointestinal: Positive for nausea. Negative for vomiting and diarrhea.  Genitourinary: Negative for dysuria, urgency, frequency and flank pain.  Musculoskeletal: Positive for myalgias.  Neurological: Positive for headaches.  Psychiatric/Behavioral: Negative for confusion.  All other systems reviewed and are negative.     Allergies  Amoxicillin  Home Medications   Prior to Admission medications   Medication Sig Start Date End Date Taking? Authorizing Provider  acetaminophen (TYLENOL) 500 MG tablet Take 1,000 mg by mouth every 6 (six) hours as needed for mild pain.   Yes Historical Provider, MD  cephALEXin (KEFLEX) 500 MG capsule Take 1 capsule (500 mg total) by mouth  3 (three) times daily. 09/25/15 10/09/15 Yes Alvira Monday, MD  HYDROcodone-acetaminophen (NORCO) 7.5-325 MG tablet Take 1-2 tablets by mouth every 6 (six) hours as needed for moderate pain or severe pain.   Yes Historical Provider, MD  ibuprofen (ADVIL,MOTRIN) 200 MG tablet Take 400-800 mg by mouth every 6 (six) hours as needed for mild pain or moderate pain.   Yes Historical Provider, MD  promethazine (PHENERGAN) 25 MG tablet Take 1 tablet (25 mg  total) by mouth every 6 (six) hours as needed for nausea or vomiting. 09/25/15  Yes Alvira Monday, MD  cetirizine (ZYRTEC) 10 MG tablet Take 1 tablet (10 mg total) by mouth daily. Patient taking differently: Take 10 mg by mouth daily as needed for allergies.  12/16/14   Tiffany A Gann, PA-C  fluticasone (FLONASE) 50 MCG/ACT nasal spray Place 2 sprays into both nostrils daily. Patient taking differently: Place 2 sprays into both nostrils daily as needed for allergies.  12/16/14   Tiffany A Gann, PA-C   BP 112/66 mmHg  Pulse 106  Temp(Src) 103 F (39.4 C) (Oral)  Resp 20  Ht  (1.549 m)  Wt 75.439 kg  BMI 31.44 kg/m2  SpO2 100%  LMP 09/21/2015 Physical Exam  Constitutional: She is oriented to person, place, and time. She appears well-developed and well-nourished.  Non-toxic appearance. She does not have a sickly appearance. She does not appear ill.  HENT:  Head: Normocephalic and atraumatic.  Mouth/Throat: Oropharynx is clear and moist.  Eyes: Conjunctivae are normal. Pupils are equal, round, and reactive to light.  Neck: Normal range of motion. Neck supple.  Cardiovascular: Regular rhythm and normal heart sounds.  Tachycardia present.   No murmur heard. Pulmonary/Chest: Effort normal and breath sounds normal. No accessory muscle usage or stridor. No respiratory distress. She has no wheezes. She has no rhonchi. She has no rales.  Abdominal: Soft. Bowel sounds are normal. She exhibits no distension. There is no tenderness. There is no rebound, no guarding and no CVA tenderness.  Musculoskeletal: Normal range of motion.  Lymphadenopathy:    She has no cervical adenopathy.  Neurological: She is alert and oriented to person, place, and time.  Speech clear without dysarthria.  Skin: Skin is warm and dry.  Psychiatric: She has a normal mood and affect. Her behavior is normal.    ED Course  Procedures (including critical care time) Labs Review Labs Reviewed  COMPREHENSIVE METABOLIC  PANEL - Abnormal; Notable for the following:    CO2 20 (*)    Total Protein 8.2 (*)    AST 78 (*)    ALT 106 (*)    All other components within normal limits  CBC WITH DIFFERENTIAL/PLATELET - Abnormal; Notable for the following:    WBC 21.0 (*)    RBC 5.44 (*)    Neutro Abs 17.6 (*)    Monocytes Absolute 1.7 (*)    All other components within normal limits  URINALYSIS, ROUTINE W REFLEX MICROSCOPIC (NOT AT [facility]) - Abnormal; Notable for the following:    Color, Urine AMBER (*)    APPearance CLOUDY (*)    Hgb urine dipstick TRACE (*)    Bilirubin Urine SMALL (*)    Ketones, ur >80 (*)    Protein, ur 100 (*)    Leukocytes, UA SMALL (*)    All other components within normal limits  URINE MICROSCOPIC-ADD ON - Abnormal; Notable for the following:    Squamous Epithelial / LPF 6-30 (*)    Bacteria, UA  FEW (*)    All other components within normal limits  CULTURE, BLOOD (ROUTINE X 2)  CULTURE, BLOOD (ROUTINE X 2)  URINE CULTURE  I-STAT CG4 LACTIC ACID, ED  POC URINE PREG, ED    Imaging Review Ct Renal Stone Study  09/27/2015  CLINICAL DATA:  RIGHT renal calculus by recent radiograph at urology office on Thursday, RIGHT flank pain, fever EXAM: CT ABDOMEN AND PELVIS WITHOUT CONTRAST TECHNIQUE: Multidetector CT imaging of the abdomen and pelvis was performed following the standard protocol without IV contrast. Sagittal and coronal MPR images reconstructed from axial data set. Oral contrast not administered for this indication. COMPARISON:  09/02/2014 FINDINGS: Lower chest:  Lung bases clear. Hepatobiliary: Liver and gallbladder grossly unremarkable. No biliary dilatation. Pancreas: Normal appearance Spleen: Normal appearance Adrenals/Urinary Tract: Normal adrenal glands. Nonobstructing calculus inferior pole LEFT kidney 4 mm diameter. Nonobstructing RIGHT renal calculi largest 5 mm diameter. Mild perinephric edema RIGHT kidney. RIGHT hydronephrosis and hydroureter. Distal RIGHT ureteral calculi  identified 2 mm image 75. RIGHT UVJ calculus 4 mm image 77. LEFT ureter and bladder unremarkable. Stomach/Bowel: Normal appendix. Stomach and bowel loops otherwise grossly unremarkable for technique. Vascular/Lymphatic: No vascular abnormalities or adenopathy Reproductive: Unremarkable uterus and adnexa. Other: No mass, free air, free fluid or hernia. Musculoskeletal: No acute abnormalities IMPRESSION: RIGHT hydronephrosis and hydroureter secondary to a 4 mm calculus at the RIGHT ureterovesical junction with an additional 2 mm distal RIGHT ureteral calculus also noted. BILATERAL nonobstructing renal calculi as well. Electronically Signed   By: Ulyses SouthwardMark  Boles M.D.   On: 09/27/2015 14:47   I have personally reviewed and evaluated these images and lab results as part of my medical decision-making.   EKG Interpretation None      MDM   Final diagnoses:  Nephrolithiasis  UTI (lower urinary tract infection)  Fever, unspecified fever cause  Leukocytosis   Patient presents with fever. Known nephrolithiasis and possible UTI. On arrival to the ED she is tachycardic with a heart rate of 125, otherwise normal vitals. She is afebrile with a temp of 100.  Associated symptoms of generalized body aches, nausea, and chills. No flank pain, urinary symptoms, cough, SOB, or abdominal pain.  On exam, HR tachycardic, lungs CTAB, abdomen soft and benign.  No CVA tenderness.    Spoke with Microbiology regarding Urine cx 09/25/15--gram negative rods, likely E.Coli.  Patient given IVF and 1g Rocephin.  Will obtain labs and blood cultures.  Will obtain CT renal stone study.  Concern for infected stone w/wo obstruction.  Labs show creatinine of 0.88. Leukocytosis of 21. Lactic acid 1.6. UA shows small leukocytes, WBCs 6-30, and few bacteria.  CT Renal Stone study shows right hydronephrosis and hydroureter secondary to 4 mm calculus at right ureterovesical junction with an additional 2 mm distal right ureteral calculus.  Spoke  with Dr. Sherryl BartersBudzyn with urology who recommends admission to medicine [facility].  Case has been discussed with and seen by Dr. Radford PaxBeaton who agrees with the above plan for admission [facility].    Lauren FowlerKayla Alyia Lacerte, PA-C 09/27/15 1612  Nelva Nayobert Beaton, MD 10/01/15 [phone]

## 2015-09-27 NOTE — ED Notes (Addendum)
Noted an [facility] started lactated ringers on pt at 1525.  Infusion showing running at this time.  [facility].  Will back time "stop" on infusion to 1525.

## 2015-09-27 NOTE — H&P (Addendum)
[facility] History and Physical  Lauren CurlingRachael D Kirschbaum ZOX:[id number] DOB: [birth year] DOA: 09/27/2015   PCP: Bennie PieriniMARTIN,MARY MARGARET, FNP    Chief Complaint: Pyelonephritis secondary to obstructive nephrolithiasis  HPI: Lauren Cardenas is a [age] y.o. WF PMHx syncopal, CKD, left nephrolithiasis,  Presents with fever, Tmax 103.9 earlier this morning along with generalized body aches, nausea, and chills. Denies cough, SOB, sore throat, flank pain, abdominal pain, urinary symptoms, neck pain/stiffness. Patient took 400 mg ibuprofen approximately 7:30 AM, and is afebrile upon arrival to ED. Her symptoms are sudden onset and waxing/waning.   Patient was dxed with 3mm renal stone by XR 09/24/15 (3 days ago) by [facility] and sent home with Hydrocodone, Promethazine, and Flomax. [facility] for fever and generalized body aches. Labs largely unremarkable with mild leukocytosis of 12.6 and borderline UA. She was sent home with Keflex. Urine culture pending.Per Dr. Radford PaxBeaton is spoken with Dr. Hadley PenBrian Budzyn [facility] who will take patient to surgery as soon [facility] Patient states last year at the same time had nephrolithiasis requiring stent placement, therefore familiar with the proposed surgery.    Review of Systems: The patient denies anorexia, fever, weight loss,, vision loss, decreased hearing, hoarseness, chest pain, syncope, dyspnea on exertion, peripheral edema, balance deficits, hemoptysis,  melena, hematochezia, severe indigestion/heartburn, hematuria, incontinence, genital sores, muscle weakness, suspicious skin lesions, transient blindness, difficulty walking, depression, unusual weight change, abnormal bleeding, enlarged lymph nodes, angioedema, and breast masses.    TRAVEL HISTORY: NA   Consultants:  Dr. Hadley PenBrian Budzyn [facility]    Procedure/Significant Events:  4/2 CT renal stone study;-RIGHT  hydronephrosis and hydroureter secondary to a 4 mm calculus at the RIGHT ureterovesical junction. Additional 2 mm distal RIGHT ureteral calculus also noted. -BILATERAL nonobstructing renal calculi     Culture   3/31 urine pending  3/4 tubes blood 2 pending   Antibiotics:  Ancef 4/1>> 4/2 Ceftriaxone 4/2>> Gentamicin 4/2 >>   DVT prophylaxis:  SCD  Devices  NA   LINES / TUBES:  NA   Past Medical History  Diagnosis Date  . Syncope 03/20/12  . Left ureteral stone   . H/O vaginal delivery 05/2012    x1  . Chronic kidney disease     left ureteral stone   Past Surgical History  Procedure Laterality Date  . Wisdom tooth extraction  03/2014  . No past surgeries    . Cystoscopy with retrograde pyelogram, ureteroscopy and stent placement Left 09/09/2014    Procedure: CYSTOSCOPY WITH LEFT RETROGRADE PYELOGRAM, URETEROSCOPY AND STONE EXTRACTION  DOUBLE J STENT PLACEMENT;  Surgeon: Jerilee FieldMatthew Eskridge, MD;  Location: [facility];  Service: Urology;  Laterality: Left;  . Holmium laser application Left 09/09/2014    Procedure: HOLMIUM LASER APPLICATION;  Surgeon: Jerilee FieldMatthew Eskridge, MD;  Location: [facility];  Service: Urology;  Laterality: Left;   Social History:  reports that she has never smoked. She has never used smokeless tobacco. She reports that she does not drink alcohol or use illicit drugs. where does patient live--home, ALF, SNF? and with whom if at home?Home with husband and daughter  Can patient participate in ADLs? Yes  Allergies  Allergen Reactions  . Amoxicillin Hives    Family History  Problem Relation Age of Onset  . Hypertension Mother   . Diabetes Father   . Kidney disease Father     Spoke with patient/family members and patient/family members stated no HTN, MI, HLD,  Mental illness, CVA, Thyroid Dz or Cancer in their family history.   Prior to Admission medications   Medication Sig Start Date End Date Taking? Authorizing Provider  acetaminophen (TYLENOL) 500 MG  tablet Take 1,000 mg by mouth every 6 (six) hours as needed for mild pain.   Yes Historical Provider, MD  cephALEXin (KEFLEX) 500 MG capsule Take 1 capsule (500 mg total) by mouth 3 (three) times daily. 09/25/15 10/09/15 Yes Alvira Monday, MD  HYDROcodone-acetaminophen (NORCO) 7.5-325 MG tablet Take 1-2 tablets by mouth every 6 (six) hours as needed for moderate pain or severe pain.   Yes Historical Provider, MD  ibuprofen (ADVIL,MOTRIN) 200 MG tablet Take 400-800 mg by mouth every 6 (six) hours as needed for mild pain or moderate pain.   Yes Historical Provider, MD  promethazine (PHENERGAN) 25 MG tablet Take 1 tablet (25 mg total) by mouth every 6 (six) hours as needed for nausea or vomiting. 09/25/15  Yes Alvira Monday, MD  cetirizine (ZYRTEC) 10 MG tablet Take 1 tablet (10 mg total) by mouth daily. Patient taking differently: Take 10 mg by mouth daily as needed for allergies.  12/16/14   Tiffany A Gann, PA-C  fluticasone (FLONASE) 50 MCG/ACT nasal spray Place 2 sprays into both nostrils daily. Patient taking differently: Place 2 sprays into both nostrils daily as needed for allergies.  12/16/14   Chelsea Primus, PA-C   Physical Exam: Filed Vitals:   09/27/15 1432 09/27/15 1500 09/27/15 1519 09/27/15 1600  BP:  112/66  120/70  Pulse:  106  120  Temp:   103 F (39.4 C)   TempSrc:   Oral   Resp: Height:      Weight:      SpO2:  100%  97%     General: A/O 4, NAD, No acute respiratory distress Eyes: Negative headache, eye pain, double vision, scotomas, floaters, negative scleral hemorrhage ENT: Negative Runny nose, negative ear pain, negative tinnitus, negative gingival bleeding, negative odynophagia Neck:  Negative scars, masses, torticollis, lymphadenopathy, JVD Lungs: Clear to auscultation bilaterally without wheezes or crackles Cardiovascular: Tachycardic, Regular rhythm without murmur gallop or rub normal S1 and S2 Abdomen:negative abdominal pain, negative dysphagia,  Nontender, nondistended, soft, bowel sounds positive, no rebound, no ascites, no appreciable mass, left CVA tenderness Extremities: No significant cyanosis, clubbing, or edema bilateral lower extremities Psychiatric:  Negative depression, negative anxiety, negative fatigue, negative mania  Neurologic:  Cranial nerves II through XII intact, tongue/uvula midline, all extremities muscle strength 5/5, sensation intact throughout, negative dysarthria, negative expressive aphasia, negative receptive aphasia.    Labs on Admission:  Basic Metabolic Panel:  Recent Labs Lab 09/25/15 1816 09/27/15 1227  NA 139 140  K 3.4* 3.7  CL 103 106  CO2 26 20*  GLUCOSE 114* 83  BUN 14 10  CREATININE 0.94 0.88  CALCIUM 8.8* 9.5   Liver Function Tests:  Recent Labs Lab 09/27/15 1227  AST 78*  ALT 106*  ALKPHOS 81  BILITOT 0.7  PROT 8.2*  ALBUMIN 3.9   No results for input(s): LIPASE, AMYLASE in the last 168 hours. No results for input(s): AMMONIA in the last 168 hours. CBC:  Recent Labs Lab 09/25/15 1816 09/27/15 1227  WBC 12.6* 21.0*  NEUTROABS 10.4* 17.6*  HGB 12.2 14.3  HCT 38.2 44.4  MCV 80.6 81.6  PLT 215 193   Cardiac Enzymes: No results for input(s): CKTOTAL, CKMB, CKMBINDEX, TROPONINI in the last 168 hours.  BNP (last 3 results)  No results for input(s): BNP in the last 8760 hours.  ProBNP (last 3 results) No results for input(s): PROBNP in the last 8760 hours.  CBG: No results for input(s): GLUCAP in the last 168 hours.  Radiological Exams on Admission: Ct Renal Stone Study  09/27/2015  CLINICAL DATA:  RIGHT renal calculus by recent radiograph at urology office on Thursday, RIGHT flank pain, fever EXAM: CT ABDOMEN AND PELVIS WITHOUT CONTRAST TECHNIQUE: Multidetector CT imaging of the abdomen and pelvis was performed following the standard protocol without IV contrast. Sagittal and coronal MPR images reconstructed from axial data set. Oral contrast not administered for  this indication. COMPARISON:  09/02/2014 FINDINGS: Lower chest:  Lung bases clear. Hepatobiliary: Liver and gallbladder grossly unremarkable. No biliary dilatation. Pancreas: Normal appearance Spleen: Normal appearance Adrenals/Urinary Tract: Normal adrenal glands. Nonobstructing calculus inferior pole LEFT kidney 4 mm diameter. Nonobstructing RIGHT renal calculi largest 5 mm diameter. Mild perinephric edema RIGHT kidney. RIGHT hydronephrosis and hydroureter. Distal RIGHT ureteral calculi identified 2 mm image 75. RIGHT UVJ calculus 4 mm image 77. LEFT ureter and bladder unremarkable. Stomach/Bowel: Normal appendix. Stomach and bowel loops otherwise grossly unremarkable for technique. Vascular/Lymphatic: No vascular abnormalities or adenopathy Reproductive: Unremarkable uterus and adnexa. Other: No mass, free air, free fluid or hernia. Musculoskeletal: No acute abnormalities IMPRESSION: RIGHT hydronephrosis and hydroureter secondary to a 4 mm calculus at the RIGHT ureterovesical junction with an additional 2 mm distal RIGHT ureteral calculus also noted. BILATERAL nonobstructing renal calculi as well. Electronically Signed   By: Ulyses Southward M.D.   On: 09/27/2015 14:47    EKG: N/A   Assessment/Plan Active Problems:   Syncope and collapse   Nephrolithiasis   Pyelonephritis   Sepsis, unspecified organism (HCC)   UTI (lower urinary tract infection)  Nephrolithiasis bilateral -Patient with obstructing stone causing hydronephrosis on the right, and nonobstructing stone on the left although patient's pain is in the left CVA. -Transfer to [facility] for surgery by Dr. Hadley Pen [facility]  -Continue current antibiotics -Awaiting cultures  Hydronephrosis -See #1  Sepsis unspecified organism -Lactic acid normal -Normal saline 157ml/hr  UTI -Awaiting urine culture, continue current antibiotics   Code Status: Full Family Communication: Husband present Disposition Plan: Surgical  resolution/resolution sepsis   Care during the described time interval was provided by me .  I have reviewed this patient's available data, including medical history, events of note, physical examination, and all test results as part of my evaluation. I have personally reviewed and interpreted all radiology studies.    Time spent: 60 minute    WOODS, Roselind Messier [facility] Pager [phone]  If 7PM-7AM, please contact night-coverage [email or web address] Password [email or web address] 09/27/2015, 4:07 PM

## 2015-09-27 NOTE — Op Note (Signed)
Date of procedure: 09/27/2015  Preoperative diagnosis:  1. Right ureteral stone 2. Bilateral nonobstructing stones 3. UTI 4. Sepsis  Postoperative diagnosis:  1. Right ureteral stone 2. Bilateral nonobstructing stones 3. UTI   Procedure: 1. Cystoscopy 2. Right retrograde pyelogram with interpretation 3. Right ureteral stent placement 6 French by 24 cm   Surgeon: Baruch Gouty, MD  Anesthesia: General  Complications: None  Intraoperative findings: The patient had a hydronephrotic drip on the right side there was allowed to drain. Drainage was clear. There was no purulent discharge. After drainage stopped, low pressure retrograde pyelogram identified the collecting system for proper stent placement.  EBL: None  Specimens: None  Drains: 6 French by 24 cm right double-J ureteral stent. 16 Fr foley catheter  Disposition: Stable to the postanesthesia care unit  Indication for procedure: The patient is a 30 y.o. female with distal right ureteral calculi causing right hydroureteronephrosis and sepsis. She has leukocytosis and his febrile. She grants today for stent placement..  After reviewing the management options for treatment, the patient elected to proceed with the above surgical procedure(s). We have discussed the potential benefits and risks of the procedure, side effects of the proposed treatment, the likelihood of the patient achieving the goals of the procedure, and any potential problems that might occur during the procedure or recuperation. Informed consent has been obtained.  Description of procedure: The patient was met in the preoperative area. All risks, benefits, and indications of the procedure were described in great detail. The patient consented to the procedure. Preoperative antibiotics were given. The patient was taken to the operative theater. General anesthesia was induced per the anesthesia service. The patient was then placed in the dorsal lithotomy position and  prepped and draped in the usual sterile fashion. A preoperative timeout was called.   A 21 French 30 cystoscope was inserted into the patient's bladder per urethra atraumatically. The sensor wire was intubated in the patient's right ureteral orifice and advanced level of the right renal pelvis under fluoroscopy. An open ended catheter was then placed over the sensor wire above the level of obstruction and the right collecting system was allowed to drain. A low-pressure right retrograde pyelogram was obtained to identify the right renal pelvis for proper ureteral stent placement. A sensor wire was then placed to the level of the right renal pelvis under fluoroscopy and the open-ended catheter was removed. A 6 French by 24 cm double-J right ureteral stent was then placed and the sensor wire removed. It was confirmed to be in the correct location with a curl seen in the patient's right renal pelvis on fluoroscopy and in the patient's urinary bladder with direct visualization. Due to the patient's underlying sepsis, the decision was made to also place a Foley catheter to allow for maximal drainage of her now unobstructed right collecting system. The patient was then awoken from anesthesia and transferred in stable condition to postanesthesia care unit.  Plan: The patient has been admitted to the hospitalist service and will be transferred to the floor. She will continue broad-spectrum antibiotics pending culture and sensitivity results. If she does well overnight, we can consider removing her urethral catheter tomorrow. She will need definitive stone management in the future as an outpatient once her infection clears.  Baruch Gouty, M.D.

## 2015-09-27 NOTE — Anesthesia Preprocedure Evaluation (Addendum)
Anesthesia Evaluation  Patient identified by MRN, date of birth, ID band Patient awake    Reviewed: Allergy & Precautions, NPO status , Patient's Chart, lab work & pertinent test results  History of Anesthesia Complications Negative for: history of anesthetic complications  Airway Mallampati: II  TM Distance: <3 FB Neck ROM: Full    Dental no notable dental hx. (+) Teeth Intact, Dental Advisory Given   Pulmonary neg pulmonary ROS,    Pulmonary exam normal breath sounds clear to auscultation       Cardiovascular Exercise Tolerance: Good negative cardio ROS Normal cardiovascular exam Rhythm:Regular Rate:Normal     Neuro/Psych negative neurological ROS  negative psych ROS   GI/Hepatic negative GI ROS, Neg liver ROS,   Endo/Other  negative endocrine ROSObesity   Renal/GU Nephrolithiasis  negative genitourinary   Musculoskeletal negative musculoskeletal ROS (+)   Abdominal   Peds negative pediatric ROS (+)  Hematology negative hematology ROS (+)   Anesthesia Other Findings   Reproductive/Obstetrics negative OB ROS                            Anesthesia Physical Anesthesia Plan  ASA: II  Anesthesia Plan: General   Post-op Pain Management:    Induction: Intravenous  Airway Management Planned: LMA  Additional Equipment:   Intra-op Plan:   Post-operative Plan: Extubation in OR  Informed Consent: I have reviewed the patients History and Physical, chart, labs and discussed the procedure including the risks, benefits and alternatives for the proposed anesthesia with the patient or authorized representative who has indicated his/her understanding and acceptance.   Dental advisory given  Plan Discussed with: CRNA  Anesthesia Plan Comments: (Risks/benefits of general anesthesia discussed with patient including risk of damage to teeth, lips, gum, and tongue, nausea/vomiting, allergic  reactions to medications, and the possibility of heart attack, stroke and death.  All patient questions answered.  Patient wishes to proceed.)        Anesthesia Quick Evaluation

## 2015-09-27 NOTE — ED Notes (Signed)
Spoke with Dr Joseph ArtWoods about pt temperature.  Was given verbal order to start cooling blanket if we could get one.  No order for tylenol or ibuprofen at this time.  Notified portable equipment for cooling blanket.

## 2015-09-28 ENCOUNTER — Encounter (HOSPITAL_COMMUNITY): Payer: Self-pay | Admitting: Urology

## 2015-09-28 DIAGNOSIS — N200 Calculus of kidney: Secondary | ICD-10-CM

## 2015-09-28 DIAGNOSIS — N390 Urinary tract infection, site not specified: Secondary | ICD-10-CM

## 2015-09-28 DIAGNOSIS — A419 Sepsis, unspecified organism: Principal | ICD-10-CM

## 2015-09-28 LAB — COMPREHENSIVE METABOLIC PANEL
ALT: 63 U/L — ABNORMAL HIGH (ref 14–54)
ANION GAP: 11 (ref 5–15)
AST: 28 U/L (ref 15–41)
Albumin: 3.5 g/dL (ref 3.5–5.0)
Alkaline Phosphatase: 69 U/L (ref 38–126)
BUN: 15 mg/dL (ref 6–20)
CALCIUM: 8.5 mg/dL — AB (ref 8.9–10.3)
CHLORIDE: 108 mmol/L (ref 101–111)
CO2: 21 mmol/L — AB (ref 22–32)
Creatinine, Ser: 0.66 mg/dL (ref 0.44–1.00)
GFR calc non Af Amer: >60 mL/min (ref >60)
GLUCOSE: 115 mg/dL — AB (ref 65–99)
POTASSIUM: 4.3 mmol/L (ref 3.5–5.1)
SODIUM: 140 mmol/L (ref 135–145)
Total Bilirubin: 0.4 mg/dL (ref 0.3–1.2)
Total Protein: 7.4 g/dL (ref 6.5–8.1)

## 2015-09-28 LAB — CBC WITH DIFFERENTIAL/PLATELET
Basophils Absolute: 0 10*3/uL (ref 0.0–0.1)
Basophils Relative: 0 %
EOS ABS: 0 10*3/uL (ref 0.0–0.7)
Eosinophils Relative: 0 %
HCT: 38.4 % (ref 36.0–46.0)
Hemoglobin: 12.6 g/dL (ref 12.0–15.0)
LYMPHS ABS: 0.8 10*3/uL (ref 0.7–4.0)
Lymphocytes Relative: 7 %
MCH: 25.6 pg — AB (ref 26.0–34.0)
MCHC: 32.8 g/dL (ref 30.0–36.0)
MCV: 77.9 fL — ABNORMAL LOW (ref 78.0–100.0)
MONOS PCT: 5 %
Monocytes Absolute: 0.6 10*3/uL (ref 0.1–1.0)
Neutro Abs: 10 10*3/uL — ABNORMAL HIGH (ref 1.7–7.7)
Neutrophils Relative %: 88 %
Platelets: 218 10*3/uL (ref 150–400)
RBC: 4.93 MIL/uL (ref 3.87–5.11)
RDW: 13.5 % (ref 11.5–15.5)
WBC: 11.4 10*3/uL — ABNORMAL HIGH (ref 4.0–10.5)

## 2015-09-28 LAB — LACTIC ACID, PLASMA: Lactic Acid, Venous: 1.2 mmol/L (ref 0.5–2.0)

## 2015-09-28 NOTE — Progress Notes (Signed)
Patient Demographics  Lauren Cardenas, is a 30 y.o. female, DOB - April 06, 1986, ZOX:[id number]  Admit date - 09/27/2015   Admitting Physician Drema Dallas, MD  Outpatient Primary MD for the patient is Bennie Pierini, FNP  LOS - 1   Chief Complaint  Patient presents with  . Fever        Subjective:   Lauren Cardenas today has, No headache, No chest pain, No abdominal pain - no fever, no nausea or vomiting . Assessment & Plan    Active Problems:   Syncope and collapse   Nephrolithiasis   Pyelonephritis   Sepsis, unspecified organism (HCC)   UTI (lower urinary tract infection)  Sepsis candidate UTI . - We sepsis criteria on admission given fever, leukocytosis - In the setting of right hydronephrosis, secondary to nephrolithiasis . - Consult appreciated, status post right ureteral stent placement . - Continue with IV Rocephin, follow on urine culture   Right hydronephrosis /bilateral nephrolithiasis - Neurology consult appreciated, status post stent placement, will need to follow Dr. Mena Goes as an outpatient regarding definitive stone management after infection has cleared .  Code Status: Full  Family Communication: D/W patient   Disposition Plan: home in 24 hrs   Procedures   Dr Sherryl Barters on 4/2 1. Cystoscopy 2. Right retrograde pyelogram with interpretation 3. Right ureteral stent placement 6 French by 24 cm   Consults   urology   Medications  Scheduled Meds: . cefTRIAXone (ROCEPHIN)  IV  2 g Intravenous Q24H   Continuous Infusions: . sodium chloride 125 mL/hr at 09/28/15 0413   PRN Meds:.acetaminophen, ibuprofen, morphine injection, ondansetron (ZOFRAN) IV  DVT Prophylaxis SCDs, ambulating  Lab Results  Component Value Date   PLT 218 09/28/2015    Antibiotics    Anti-infectives    Start     Dose/Rate Route Frequency Ordered Stop   09/28/15 1300   cefTRIAXone (ROCEPHIN) 2 g in dextrose 5 % 50 mL IVPB     2 g 100 mL/hr over 30 Minutes Intravenous Every 24 hours 09/27/15 1707     09/27/15 1513  [MAR Hold]  gentamicin (GARAMYCIN) 280 mg in dextrose 5 % 50 mL IVPB     (MAR Hold since 09/27/15 1838)   280 mg 114 mL/hr over 30 Minutes Intravenous 60 min pre-op 09/27/15 1513 09/27/15 1932   09/27/15 1215  cefTRIAXone (ROCEPHIN) 1 g in dextrose 5 % 50 mL IVPB     1 g 100 mL/hr over 30 Minutes Intravenous  Once 09/27/15 1213 09/27/15 1335          Objective:   Filed Vitals:   09/27/15 2000 09/27/15 2002 09/27/15 2021 09/28/15 0647  BP: 118/74  130/84 122/69  Pulse: 103 106 108 99  Temp:   99.2 F (37.3 C) 97.9 F (36.6 C)  TempSrc:   Oral Oral  Resp: Height:      Weight:      SpO2: 96% 96% 98% 98%    Wt Readings from Last 3 Encounters:  09/27/15 75.439 kg (166 lb 5 oz)  12/31/14 81.647 kg (180 lb)  12/16/14 81.194 kg (179 lb)     Intake/Output Summary (Last 24 hours) at 09/28/15 1115 Last data filed at 09/28/15  1111  Gross per 24 hour  Intake 1895.83 ml  Output   1301 ml  Net 594.83 ml     Physical Exam  Awake Alert, Oriented X 3, No new F.N deficits, Normal affect [place].AT,PERRAL Supple Neck,No JVD, No cervical lymphadenopathy appriciated.  Symmetrical Chest wall movement, Good air movement bilaterally, CTAB RRR,No Gallops,Rubs or new Murmurs, No Parasternal Heave +ve B.Sounds, Abd Soft, No tenderness, No organomegaly appriciated, No rebound - guarding or rigidity. No Cyanosis, Clubbing or edema, No new Rash or bruise    Data Review   Micro Results Recent Results (from the past 240 hour(s))  Urine culture     Status: None (Preliminary result)   Collection Time: 09/25/15  8:17 PM  Result Value Ref Range Status   Specimen Description URINE, CLEAN CATCH  Final   Special Requests NONE  Final   Culture   Final    CULTURE REINCUBATED FOR BETTER GROWTH CORRECTED RESULTS CALLED TO: D DIRK,RN AT  8295 09/28/15 BY L BENFIELD PREVIOUSLY REPORTED AS >=100,000/ML GRAM NEGATIVE RODS Performed at [facility]    Report Status PENDING  Incomplete    Radiology Reports Ct Renal Stone Study  09/27/2015  CLINICAL DATA:  RIGHT renal calculus by recent radiograph at urology office on Thursday, RIGHT flank pain, fever EXAM: CT ABDOMEN AND PELVIS WITHOUT CONTRAST TECHNIQUE: Multidetector CT imaging of the abdomen and pelvis was performed following the standard protocol without IV contrast. Sagittal and coronal MPR images reconstructed from axial data set. Oral contrast not administered for this indication. COMPARISON:  09/02/2014 FINDINGS: Lower chest:  Lung bases clear. Hepatobiliary: Liver and gallbladder grossly unremarkable. No biliary dilatation. Pancreas: Normal appearance Spleen: Normal appearance Adrenals/Urinary Tract: Normal adrenal glands. Nonobstructing calculus inferior pole LEFT kidney 4 mm diameter. Nonobstructing RIGHT renal calculi largest 5 mm diameter. Mild perinephric edema RIGHT kidney. RIGHT hydronephrosis and hydroureter. Distal RIGHT ureteral calculi identified 2 mm image 75. RIGHT UVJ calculus 4 mm image 77. LEFT ureter and bladder unremarkable. Stomach/Bowel: Normal appendix. Stomach and bowel loops otherwise grossly unremarkable for technique. Vascular/Lymphatic: No vascular abnormalities or adenopathy Reproductive: Unremarkable uterus and adnexa. Other: No mass, free air, free fluid or hernia. Musculoskeletal: No acute abnormalities IMPRESSION: RIGHT hydronephrosis and hydroureter secondary to a 4 mm calculus at the RIGHT ureterovesical junction with an additional 2 mm distal RIGHT ureteral calculus also noted. BILATERAL nonobstructing renal calculi as well. Electronically Signed   By: Ulyses Southward M.D.   On: 09/27/2015 14:47     CBC  Recent Labs Lab 09/25/15 1816 09/27/15 1227 09/28/15 0929  WBC 12.6* 21.0* 11.4*  HGB 12.2 14.3 12.6  HCT 38.2 44.4 38.4  PLT 215 193 218    MCV 80.6 81.6 77.9*  MCH 25.7* 26.3 25.6*  MCHC 31.9 32.2 32.8  RDW 13.8 13.9 13.5  LYMPHSABS 1.2 1.7 0.8  MONOABS 0.9 1.7* 0.6  EOSABS 0.1 0.0 0.0  BASOSABS 0.0 0.0 0.0    Chemistries   Recent Labs Lab 09/25/15 1816 09/27/15 1227 09/28/15 0929  NA 139 140 140  K 3.4* 3.7 4.3  CL 103 106 108  CO2 26 20* 21*  GLUCOSE 114* 83 115*  BUN CREATININE 0.94 0.88 0.66  CALCIUM 8.8* 9.5 8.5*  AST  --  78* 28  ALT  --  106* 63*  ALKPHOS  --  81 69  BILITOT  --  0.7 0.4   ------------------------------------------------------------------------------------------------------------------ estimated creatinine clearance is 94.6 mL/min (by C-G formula based on Cr  of 0.66). ------------------------------------------------------------------------------------------------------------------ No results for input(s): HGBA1C in the last 72 hours. ------------------------------------------------------------------------------------------------------------------ No results for input(s): CHOL, HDL, LDLCALC, TRIG, CHOLHDL, LDLDIRECT in the last 72 hours. ------------------------------------------------------------------------------------------------------------------ No results for input(s): TSH, T4TOTAL, T3FREE, THYROIDAB in the last 72 hours.  Invalid input(s): FREET3 ------------------------------------------------------------------------------------------------------------------ No results for input(s): VITAMINB12, FOLATE, FERRITIN, TIBC, IRON, RETICCTPCT in the last 72 hours.  Coagulation profile No results for input(s): INR, PROTIME in the last 168 hours.  No results for input(s): DDIMER in the last 72 hours.  Cardiac Enzymes No results for input(s): CKMB, TROPONINI, MYOGLOBIN in the last 168 hours.  Invalid input(s): CK ------------------------------------------------------------------------------------------------------------------ Invalid input(s): POCBNP     Time  Spent in minutes   25 minutes   Kolten Ryback M.D on 09/28/2015 at 11:15 AM  Between 7am to 7pm - Pager - [phone]  After 7pm go to [email or web address] - password [email or web address]  [facility]   Office  [phone]

## 2015-09-28 NOTE — Care Management Note (Signed)
Case Management Note  Patient Details  Name: Lauren Cardenas MRN: [id number] Date of Birth: 11/26/1985  Subjective/Objective:   30 y/o f admitted w/syncope, UTI. From home.                 Action/Plan:d/c plan home.   Expected Discharge Date:                  Expected Discharge Plan:  Home/Self Care  In-House Referral:     Discharge planning Services  CM Consult  Post Acute Care Choice:    Choice offered to:     DME Arranged:    DME Agency:     HH Arranged:    HH Agency:     Status of Service:  In process, will continue to follow  Medicare Important Message Given:    Date Medicare IM Given:    Medicare IM give by:    Date Additional Medicare IM Given:    Additional Medicare Important Message give by:     If discussed at Long Length of Stay Meetings, dates discussed:    Additional Comments:  Lanier ClamMahabir, Shasha Buchbinder, RN 09/28/2015, 3:13 PM

## 2015-09-28 NOTE — Progress Notes (Signed)
Patient defervesced overnight Reports feeling much better Flank pain resolved No n/v/c  Filed Vitals:   09/27/15 2000 09/27/15 2002 09/27/15 2021 09/28/15 0647  BP: 118/74  130/84 122/69  Pulse: 103 106 108 99  Temp:   99.2 F (37.3 C) 97.9 F (36.6 C)  TempSrc:   Oral Oral  Resp: 16 19 18 18   Height:      Weight:      SpO2: 96% 96% 98% 98%   I/O last 3 completed shifts: In: 1575 [I.V.:500; IV Piggyback:1075] Out: 1000 [Urine:1000]   NAD Soft NT ND Foley clear light pink  Labs pending  POD 1 right ureteral stent for septic stone. Improving. -continue abx pending c/s results -d/c foley catheter -no further urologic intervention during this admission -patient will follow up with her primary urologist, Dr. Mena GoesEskridge, for definitive stone management after infection has cleared

## 2015-09-28 NOTE — Progress Notes (Signed)
Pharmacy Antibiotic Note  Mindi CurlingRachael D Drzewiecki is a 30 y.o. female admitted on 09/27/2015 with pyelonephritis.  Pharmacy has been consulted for ceftriaxone dosing. Gentamicin x1 dose given preop 4/2 prior to cystoscopy w/ R ureteral stent for R ureteral stone.   Day #2 antibiotics  Plan:  Continue ceftriaxone 2g q24h  No renal dose adjustment required for ceftriaxone, pharmacy to sign-off  F/u renal function, cultures, clinical course  Height: 5\' 1"  (154.9 cm) Weight: 166 lb 5 oz (75.439 kg) IBW/kg (Calculated) : 47.8  Temp (24hrs), Avg:100.1 F (37.8 C), Min:97.9 F (36.6 C), Max:103 F (39.4 C)   Recent Labs Lab 09/25/15 1816 09/27/15 1214 09/27/15 1227  WBC 12.6*  --  21.0*  CREATININE 0.94  --  0.88  LATICACIDVEN  --  1.67  --     Estimated Creatinine Clearance: 86 mL/min (by C-G formula based on Cr of 0.88).    Allergies  Allergen Reactions  . Amoxicillin Hives    Antimicrobials this admission: 04/2 ceftriaxone-->  Microbiology results: 04/2 blood cx x 2--> sent 4/2 Urine: sent  Thank you for allowing pharmacy to be a part of this patient's care.  Juliette Alcideustin Zeigler, PharmD, BCPS.   Pager: [phone] 09/28/2015 7:43 AM

## 2015-09-29 DIAGNOSIS — R55 Syncope and collapse: Secondary | ICD-10-CM

## 2015-09-29 LAB — URINE CULTURE

## 2015-09-29 LAB — BASIC METABOLIC PANEL
Anion gap: 8 (ref 5–15)
BUN: 17 mg/dL (ref 6–20)
CALCIUM: 8.5 mg/dL — AB (ref 8.9–10.3)
CO2: 24 mmol/L (ref 22–32)
CREATININE: 0.67 mg/dL (ref 0.44–1.00)
Chloride: 110 mmol/L (ref 101–111)
GFR calc non Af Amer: >60 mL/min (ref >60)
Glucose, Bld: 119 mg/dL — ABNORMAL HIGH (ref 65–99)
Potassium: 4.2 mmol/L (ref 3.5–5.1)
SODIUM: 142 mmol/L (ref 135–145)

## 2015-09-29 LAB — CBC
HCT: 34.3 % — ABNORMAL LOW (ref 36.0–46.0)
Hemoglobin: 11.2 g/dL — ABNORMAL LOW (ref 12.0–15.0)
MCH: 25.8 pg — ABNORMAL LOW (ref 26.0–34.0)
MCHC: 32.7 g/dL (ref 30.0–36.0)
MCV: 79 fL (ref 78.0–100.0)
Platelets: 234 10*3/uL (ref 150–400)
RBC: 4.34 MIL/uL (ref 3.87–5.11)
RDW: 13.9 % (ref 11.5–15.5)
WBC: 14.9 10*3/uL — ABNORMAL HIGH (ref 4.0–10.5)

## 2015-09-29 MED ORDER — CIPROFLOXACIN HCL 500 MG PO TABS: 500.0000 mg | ORAL_TABLET | Freq: Two times a day (BID) | ORAL | Status: DC

## 2015-09-29 MED ORDER — SACCHAROMYCES BOULARDII 250 MG PO CAPS: 250.0000 mg | ORAL_CAPSULE | Freq: Two times a day (BID) | ORAL | Status: DC

## 2015-09-29 MED ORDER — ACETAMINOPHEN ER 650 MG PO TBCR: 650.0000 mg | EXTENDED_RELEASE_TABLET | Freq: Three times a day (TID) | ORAL | Status: DC | PRN

## 2015-09-29 NOTE — Discharge Summary (Addendum)
SOLAE NORLING, is a 30 y.o. female  DOB 24-Dec-1985  MRN [id number].  Admission date:  09/27/2015  Admitting Physician  Allie Bossier, MD  Discharge Date:  09/29/2015   Primary MD  Chevis Pretty, FNP  Recommendations for primary care physician for things to follow:  - Follow with his primary urologist in 2 weeks regarding definitive treatment of nephrolithiasis.   Admission Diagnosis  Nephrolithiasis [N20.0] Leukocytosis [D72.829] UTI (lower urinary tract infection) [N39.0] Fever, unspecified fever cause [R50.9]   Discharge Diagnosis  Nephrolithiasis [N20.0] Leukocytosis [D72.829] UTI (lower urinary tract infection) [N39.0] Fever, unspecified fever cause [R50.9]    Active Problems:   Syncope and collapse   Nephrolithiasis   Pyelonephritis   Sepsis, unspecified organism [facility])   UTI (lower urinary tract infection)      Past Medical History  Diagnosis Date  . Syncope 03/20/12  . Left ureteral stone   . H/O vaginal delivery 05/2012    x1  . Chronic kidney disease     left ureteral stone    Past Surgical History  Procedure Laterality Date  . Wisdom tooth extraction  03/2014  . No past surgeries    . Cystoscopy with retrograde pyelogram, ureteroscopy and stent placement Left 09/09/2014    Procedure: CYSTOSCOPY WITH LEFT RETROGRADE PYELOGRAM, URETEROSCOPY AND STONE EXTRACTION  DOUBLE J STENT PLACEMENT;  Surgeon: Festus Aloe, MD;  Location: [facility];  Service: Urology;  Laterality: Left;  . Holmium laser application Left 9/93/7169    Procedure: HOLMIUM LASER APPLICATION;  Surgeon: Festus Aloe, MD;  Location: [facility];  Service: Urology;  Laterality: Left;  . Cystoscopy w/ ureteral stent placement Right 09/27/2015    Procedure: CYSTOSCOPY WITH RETROGRADE PYELOGRAM/URETERAL STENT PLACEMENT;  Surgeon: Nickie Retort, MD;  Location: [facility];  Service: Urology;  Laterality: Right;        History of present illness and  Hospital Course:     Kindly see H&P for history of present illness and admission details, please review complete Labs, Consult reports and Test reports for all details in brief  HPI  from the history and physical done on the day of admission 09/27/2015 Presents with fever, Tmax 103.9 earlier this morning along with generalized body aches, nausea, and chills. Denies cough, SOB, sore throat, flank pain, abdominal pain, urinary symptoms, neck pain/stiffness. Patient took 400 mg ibuprofen approximately 7:30 AM, and is afebrile upon arrival to ED. Her symptoms are sudden onset and waxing/waning.   Patient was dxed with 83m renal stone by XR 09/24/15 (3 days ago) by [facility] and sent home with Hydrocodone, Promethazine, and Flomax. [facility] fever and generalized body aches. Labs largely unremarkable with mild leukocytosis of 12.6 and borderline UA. She was sent home with Keflex. Urine culture pending.Per Dr. BAudie Pintois [facility] Patient states last year at the same time had nephrolithiasis requiring stent placement,  therefore familiar with the proposed surgery.     Hospital Course   Sepsis candidate UTI . - met  sepsis criteria on admission given fever, leukocytosis, currently afebrile over last 48 hours, leukocytosis is improving, is 14.9 today, patient denies abdominal pain, benign abdominal exam. - In the setting of right hydronephrosis, secondary to nephrolithiasis . - urology Consult appreciated, status post right ureteral stent placement . - Treated with IV Rocephin 3 days, to finish another 7 days of by mouth ciprofloxacin as an outpatient.  Right hydronephrosis /bilateral nephrolithiasis - urology consult appreciated, status post stent placement, will need to follow Dr. Junious Silk as an  outpatient regarding definitive stone management after infection has cleared .  Syncope - Secondary to sepsis, - ambulating in the hallway with no dizziness or lightheadedness.  Discharge Condition:  stable   Follow UP  Follow-up Information    Follow up with ESKRIDGE, MATTHEW, MD In 2 weeks.   Specialty:  Urology   Why:  definitive stone management   Contact information:   [address] [place] [address] [phone]         Discharge Instructions  and  Discharge Medications     Discharge Instructions    Discharge instructions    Complete by:  As directed   Follow with Primary MD Chevis Pretty, FNP in 7 days   Get CBC, CMP, checked  by Primary MD next visit.    Activity: As tolerated with Full fall precautions use walker/cane & assistance as needed   Disposition Home    Diet: regular , with feeding assistance and aspiration precautions.  For Heart failure patients - Check your Weight same time everyday, if you gain over 2 pounds, or you develop in leg swelling, experience more shortness of breath or chest pain, call your Primary MD immediately. Follow Cardiac Low Salt Diet and 1.5 lit/day fluid restriction.   On your next visit with your primary care physician please Get Medicines reviewed and adjusted.   Please request your Prim.MD to go over all Hospital Tests and Procedure/Radiological results at the follow up, please get all Hospital records sent to your Prim MD by signing hospital release before you go home.   If you experience worsening of your admission symptoms, develop shortness of breath, life threatening emergency, suicidal or homicidal thoughts you must seek medical attention immediately by calling 911 or calling your MD immediately  if symptoms less severe.  You Must read complete instructions/literature along with all the possible adverse reactions/side effects for all the Medicines you take and that have been prescribed to you. Take any  new Medicines after you have completely understood and accpet all the possible adverse reactions/side effects.   Do not drive, operating heavy machinery, perform activities at heights, swimming or participation in water activities or provide baby sitting services if your were admitted for syncope or siezures until you have seen by Primary MD or a Neurologist and advised to do so again.  Do not drive when taking Pain medications.    Do not take more than prescribed Pain, Sleep and Anxiety Medications  Special Instructions: If you have smoked or chewed Tobacco  in the last 2 yrs please stop smoking, stop any regular Alcohol  and or any Recreational drug use.  Wear Seat belts while driving.   Please note  You were cared for by a hospitalist during your hospital stay. If you have any questions about your discharge medications or the care you received while you were  in the hospital after you are discharged, you can call the unit and asked to speak with the hospitalist on call if the hospitalist that took care of you is not available. Once you are discharged, your primary care physician will handle any further medical issues. Please note that NO REFILLS for any discharge medications will be authorized once you are discharged, as it is imperative that you return to your primary care physician (or establish a relationship with a primary care physician if you do not have one) for your aftercare needs so that they can reassess your need for medications and monitor your lab values.     Increase activity slowly    Complete by:  As directed             Medication List    STOP taking these medications        acetaminophen 500 MG tablet  Commonly known as:  TYLENOL  Replaced by:  acetaminophen 650 MG CR tablet      TAKE these medications        acetaminophen 650 MG CR tablet  Commonly known as:  ACETAMINOPHEN 8 HOUR  Take 1 tablet (650 mg total) by mouth every 8 (eight) hours as needed for pain  or fever.     ciprofloxacin 500 MG tablet  Commonly known as:  CIPRO  Take 1 tablet (500 mg total) by mouth 2 (two) times daily.     ibuprofen 200 MG tablet  Commonly known as:  ADVIL,MOTRIN  Take 400-800 mg by mouth every 6 (six) hours as needed for mild pain or moderate pain.     saccharomyces boulardii 250 MG capsule  Commonly known as:  FLORASTOR  Take 1 capsule (250 mg total) by mouth 2 (two) times daily.          Diet and Activity recommendation: See Discharge Instructions above   Consults obtained -  urolgoy   Major procedures and Radiology Reports - PLEASE review detailed and final reports for all details, in brief -   Dr Pilar Jarvis on 4/2 1. Cystoscopy 2. Right retrograde pyelogram with interpretation 3. Right ureteral stent placement 6 French by 24 cm 4.    Ct Renal Stone Study  09/27/2015  CLINICAL DATA:  RIGHT renal calculus by recent radiograph at urology office on Thursday, RIGHT flank pain, fever EXAM: CT ABDOMEN AND PELVIS WITHOUT CONTRAST TECHNIQUE: Multidetector CT imaging of the abdomen and pelvis was performed following the standard protocol without IV contrast. Sagittal and coronal MPR images reconstructed from axial data set. Oral contrast not administered for this indication. COMPARISON:  09/02/2014 FINDINGS: Lower chest:  Lung bases clear. Hepatobiliary: Liver and gallbladder grossly unremarkable. No biliary dilatation. Pancreas: Normal appearance Spleen: Normal appearance Adrenals/Urinary Tract: Normal adrenal glands. Nonobstructing calculus inferior pole LEFT kidney 4 mm diameter. Nonobstructing RIGHT renal calculi largest 5 mm diameter. Mild perinephric edema RIGHT kidney. RIGHT hydronephrosis and hydroureter. Distal RIGHT ureteral calculi identified 2 mm image 75. RIGHT UVJ calculus 4 mm image 77. LEFT ureter and bladder unremarkable. Stomach/Bowel: Normal appendix. Stomach and bowel loops otherwise grossly unremarkable for technique. Vascular/Lymphatic: No  vascular abnormalities or adenopathy Reproductive: Unremarkable uterus and adnexa. Other: No mass, free air, free fluid or hernia. Musculoskeletal: No acute abnormalities IMPRESSION: RIGHT hydronephrosis and hydroureter secondary to a 4 mm calculus at the RIGHT ureterovesical junction with an additional 2 mm distal RIGHT ureteral calculus also noted. BILATERAL nonobstructing renal calculi as well. Electronically Signed   By: Crist Infante.D.  On: 09/27/2015 14:47    Micro Results   Recent Results (from the past 240 hour(s))  Urine culture     Status: None (Preliminary result)   Collection Time: 09/25/15  8:17 PM  Result Value Ref Range Status   Specimen Description URINE, CLEAN CATCH  Final   Special Requests NONE  Final   Culture   Final    CULTURE REINCUBATED FOR BETTER GROWTH CORRECTED RESULTS CALLED TO: D DIRK,RN AT 0831 09/28/15 BY L BENFIELD PREVIOUSLY REPORTED AS >=100,000/ML GRAM NEGATIVE RODS Performed at [facility]    Report Status PENDING  Incomplete  Blood Culture (routine x 2)     Status: None (Preliminary result)   Collection Time: 09/27/15 11:51 AM  Result Value Ref Range Status   Specimen Description BLOOD RIGHT HAND  Final   Special Requests IN PEDIATRIC BOTTLE 3CC  Final   Culture NO GROWTH < 24 HOURS  Final   Report Status PENDING  Incomplete  Urine culture     Status: None (Preliminary result)   Collection Time: 09/27/15 12:10 PM  Result Value Ref Range Status   Specimen Description URINE, CLEAN CATCH  Final   Special Requests NONE  Final   Culture CULTURE REINCUBATED FOR BETTER GROWTH  Final   Report Status PENDING  Incomplete  Blood Culture (routine x 2)     Status: None (Preliminary result)   Collection Time: 09/27/15 12:17 PM  Result Value Ref Range Status   Specimen Description BLOOD RIGHT HAND  Final   Special Requests BOTTLES DRAWN AEROBIC ONLY 5CC  Final   Culture NO GROWTH < 24 HOURS  Final   Report Status PENDING  Incomplete       Today    Subjective:   Meisha Stallsmith today has no headache,no chest abdominal pain,no new weakness tingling or numbness, feels much better wants to go home today.   Objective:   Blood pressure 121/79, pulse 76, temperature 98.1 F (36.7 C), temperature source Oral, resp. rate 18, height 5' 1" (1.549 m), weight 75.439 kg (166 lb 5 oz), last menstrual period 09/21/2015, SpO2 100 %.   Intake/Output Summary (Last 24 hours) at 09/29/15 1030 Last data filed at 09/29/15 0700  Gross per 24 hour  Intake 3448.33 ml  Output    901 ml  Net 2547.33 ml    Exam Awake Alert, Oriented x 3, No new F.N deficits, Normal affect [place].AT,PERRAL Supple Neck,No JVD, No cervical lymphadenopathy appriciated.  Symmetrical Chest wall movement, Good air movement bilaterally, CTAB RRR,No Gallops,Rubs or new Murmurs, No Parasternal Heave +ve B.Sounds, Abd Soft, Non tender, No organomegaly appriciated, No rebound -guarding or rigidity. No Cyanosis, Clubbing or edema, No new Rash or bruise  Data Review   CBC w Diff: Lab Results  Component Value Date   WBC 14.9* 09/29/2015   HGB 11.2* 09/29/2015   HCT 34.3* 09/29/2015   PLT 234 09/29/2015   LYMPHOPCT 7 09/28/2015   MONOPCT 5 09/28/2015   EOSPCT 0 09/28/2015   BASOPCT 0 09/28/2015    CMP: Lab Results  Component Value Date   NA 142 09/29/2015   K 4.2 09/29/2015   CL 110 09/29/2015   CO2 24 09/29/2015   BUN 17 09/29/2015   CREATININE 0.67 09/29/2015   PROT 7.4 09/28/2015   ALBUMIN 3.5 09/28/2015   BILITOT 0.4 09/28/2015   ALKPHOS 69 09/28/2015   AST 28 09/28/2015   ALT 63* 09/28/2015  .   Total Time in preparing paper work, data evaluation  and todays exam - 35 minutes  ELGERGAWY, DAWOOD M.D on 09/29/2015 at 10:30 AM  [facility]   Office  [phone]

## 2015-09-29 NOTE — Progress Notes (Signed)
Patient feeling better. Bradycardia to go home. Urine culture pending.  She looks well.  Filed Vitals:   09/28/15 2058 09/29/15 0537  BP: 125/74 121/79  Pulse: 95 76  Temp: 98 F (36.7 C) 98.1 F (36.7 C)  Resp: 18 18     She was stented for right distal ureteral stones, fever and UTI-continue po antibiotics. All of the office call her to set up right ureteroscopy, laser lithotripsy and stent exchange. I discussed the nature risks benefits and alternatives to these procedures with the patient and her husband. She elects to proceed. She has a stone in her kidney we can also try to clear. Appreciate great care from the hospitalists.

## 2015-09-29 NOTE — Discharge Instructions (Signed)
Follow with Primary MD Bennie PieriniMARTIN,MARY MARGARET, FNP in 7 days   Get CBC, CMP, 2 view Chest X ray checked  by Primary MD next visit.    Activity: As tolerated with Full fall precautions use walker/cane & assistance as needed   Disposition Home    Diet: regular diet , with feeding assistance and aspiration precautions.  For Heart failure patients - Check your Weight same time everyday, if you gain over 2 pounds, or you develop in leg swelling, experience more shortness of breath or chest pain, call your Primary MD immediately. Follow Cardiac Low Salt Diet and 1.5 lit/day fluid restriction.   On your next visit with your primary care physician please Get Medicines reviewed and adjusted.   Please request your Prim.MD to go over all Hospital Tests and Procedure/Radiological results at the follow up, please get all Hospital records sent to your Prim MD by signing hospital release before you go home.   If you experience worsening of your admission symptoms, develop shortness of breath, life threatening emergency, suicidal or homicidal thoughts you must seek medical attention immediately by calling 911 or calling your MD immediately  if symptoms less severe.  You Must read complete instructions/literature along with all the possible adverse reactions/side effects for all the Medicines you take and that have been prescribed to you. Take any new Medicines after you have completely understood and accpet all the possible adverse reactions/side effects.   Do not drive, operating heavy machinery, perform activities at heights, swimming or participation in water activities or provide baby sitting services if your were admitted for syncope or siezures until you have seen by Primary MD or a Neurologist and advised to do so again.  Do not drive when taking Pain medications.    Do not take more than prescribed Pain, Sleep and Anxiety Medications  Special Instructions: If you have smoked or chewed Tobacco   in the last 2 yrs please stop smoking, stop any regular Alcohol  and or any Recreational drug use.  Wear Seat belts while driving.   Please note  You were cared for by a hospitalist during your hospital stay. If you have any questions about your discharge medications or the care you received while you were in the hospital after you are discharged, you can call the unit and asked to speak with the hospitalist on call if the hospitalist that took care of you is not available. Once you are discharged, your primary care physician will handle any further medical issues. Please note that NO REFILLS for any discharge medications will be authorized once you are discharged, as it is imperative that you return to your primary care physician (or establish a relationship with a primary care physician if you do not have one) for your aftercare needs so that they can reassess your need for medications and monitor your lab values. Ureteral Stent Implantation, Care After Refer to this sheet in the next few weeks. These instructions provide you with information on caring for yourself after your procedure. Your health care provider may also give you more specific instructions. Your treatment has been planned according to current medical practices, but problems sometimes occur. Call your health care provider if you have any problems or questions after your procedure. WHAT TO EXPECT AFTER THE PROCEDURE You should be back to normal activity within 48 hours after the procedure. Nausea and vomiting may occur and are commonly the result of anesthesia. It is common to experience sharp pain in the back or  lower abdomen and penis with voiding. This is caused by movement of the ends of the stent with the act of urinating.It usually goes away within minutes after you have stopped urinating. HOME CARE INSTRUCTIONS Make sure to drink plenty of fluids. You may have small amounts of bleeding, causing your urine to be red. This is  normal. Certain movements may trigger pain or a feeling that you need to urinate. You may be given medicines to prevent infection or bladder spasms. Be sure to take all medicines as directed. Only take over-the-counter or prescription medicines for pain, discomfort, or fever as directed by your health care provider. Do not take aspirin, as this can make bleeding worse. Your stent will be left in until the blockage is resolved. This may take 2 weeks or longer, depending on the reason for stent implantation. You may have an X-ray exam to make sure your ureter is open and that the stent has not moved out of position (migrated). The stent can be removed by your health care provider in the office. Medicines may be given for comfort while the stent is being removed. Be sure to keep all follow-up appointments so your health care provider can check that you are healing properly. SEEK MEDICAL CARE IF:  You experience increasing pain.  Your pain medicine is not working. SEEK IMMEDIATE MEDICAL CARE IF:  Your urine is dark red or has blood clots.  You are leaking urine (incontinent).  You have a fever, chills, feeling sick to your stomach (nausea), or vomiting.  Your pain is not relieved by pain medicine.  The end of the stent comes out of the urethra.  You are unable to urinate.   This information is not intended to replace advice given to you by your health care provider. Make sure you discuss any questions you have with your health care provider.   Document Released: 02/13/2013 Document Revised: 06/18/2013 Document Reviewed: 12/26/2014 Elsevier Interactive Patient Education [employer]

## 2015-09-29 NOTE — Care Management Note (Signed)
Case Management Note  Patient Details  Name: Lauren Cardenas MRN: [id number] Date of Birth: 07-28-1985  Subjective/Objective:                    Action/Plan:d/c home.   Expected Discharge Date:                  Expected Discharge Plan:  Home/Self Care  In-House Referral:     Discharge planning Services  CM Consult  Post Acute Care Choice:    Choice offered to:     DME Arranged:    DME Agency:     HH Arranged:    HH Agency:     Status of Service:  Completed, signed off  Medicare Important Message Given:    Date Medicare IM Given:    Medicare IM give by:    Date Additional Medicare IM Given:    Additional Medicare Important Message give by:     If discussed at Long Length of Stay Meetings, dates discussed:    Additional Comments:  Lanier ClamMahabir, Kela Baccari, RN 09/29/2015, 11:02 AM

## 2015-09-30 ENCOUNTER — Telehealth (HOSPITAL_COMMUNITY): Payer: Self-pay

## 2015-09-30 ENCOUNTER — Other Ambulatory Visit: Payer: Self-pay | Admitting: Urology

## 2015-09-30 NOTE — Telephone Encounter (Signed)
Post ED Visit - Positive Culture Follow-up  Culture report reviewed by antimicrobial stewardship pharmacist:  []  Enzo BiNathan Batchelder, Pharm.D. []  Celedonio MiyamotoJeremy Frens, Pharm.D., BCPS []  Garvin FilaMike Maccia, Pharm.D. []  Georgina PillionElizabeth Martin, Pharm.D., BCPS []  Redbird SmithMinh Pham, [address]Ph[address]D., BCPS, AAHIVP []  Estella HuskMichelle Turner, Pharm.D., BCPS, AAHIVP []  Tennis Mustassie Stewart, Pharm.D. []  Sherle Poeob Vincent, [address]Pharm[address] Darin EngelsX  Meagan Miles, Pharm.D.  Positive urine culture, >/= 100,000 colonies -> gram negative rods Treated with Cephalexin, organism sensitive to the same and no further patient follow-up is required at this time.  Arvid RightClark, Rubin Dais Dorn 09/30/2015, 9:54 AM

## 2015-10-02 LAB — CULTURE, BLOOD (ROUTINE X 2)
CULTURE: NO GROWTH
Culture: NO GROWTH

## 2015-10-05 ENCOUNTER — Encounter: Payer: Self-pay | Admitting: Family Medicine

## 2015-10-05 ENCOUNTER — Ambulatory Visit (INDEPENDENT_AMBULATORY_CARE_PROVIDER_SITE_OTHER): Payer: Medicaid Other | Admitting: Family Medicine

## 2015-10-05 VITALS — BP 129/77 | HR 83 | Temp 97.8°F | Ht 61.0 in | Wt 166.4 lb

## 2015-10-05 DIAGNOSIS — N200 Calculus of kidney: Secondary | ICD-10-CM

## 2015-10-05 DIAGNOSIS — N12 Tubulo-interstitial nephritis, not specified as acute or chronic: Secondary | ICD-10-CM | POA: Diagnosis not present

## 2015-10-05 DIAGNOSIS — D72829 Elevated white blood cell count, unspecified: Secondary | ICD-10-CM

## 2015-10-05 NOTE — Progress Notes (Signed)
   HPI  Patient presents today here for hospital follow-up.  Patient explains that she was recently hospitalized for infected kidney stone, pyelonephritis She was treated with Rocephin 3, and put on oral ciprofloxacin which she is still finishing. Her appetite has not quite come back, however her flank pain is resolved, and she feels well. She has ureteroscopy scheduled for April 21, 11 days from now She has a right-sided ureteral stent now. She has persistent hematuria  She denies fever, nausea, vomiting.  PMH: Smoking status noted ROS: Per HPI  Objective: BP 129/77 mmHg  Pulse 83  Temp(Src) 97.8 F (36.6 C) (Oral)  Ht '5\' 1"'$  (1.549 m)  Wt 166 lb 6.4 oz (75.479 kg)  BMI 31.46 kg/m2  LMP 09/21/2015 Gen: NAD, alert, cooperative with exam HEENT: NCAT CV: RRR, good S1/S2, no murmur Resp: CTABL, no wheezes, non-labored Abd: No CVA tenderness Ext: No edema, warm Neuro: Alert and oriented, No gross deficits  Assessment and plan:  # Nephrolithiasis, pyelonephritis Doing well Tolerating antibiotics Repeat labs for significant leukocytosis Has good uro f/u RTC with any concerns     Orders Placed This Encounter  Procedures  . CBC  . Lloyd, MD [facility] 10/05/2015, 8:58 AM

## 2015-10-05 NOTE — Patient Instructions (Signed)
Great to meet you!  I am glad you are doing better.  We will call with lab results within 1 week.

## 2015-10-06 LAB — CBC
Hematocrit: 40.3 % (ref 34.0–46.6)
Hemoglobin: 12.9 g/dL (ref 11.1–15.9)
MCH: 25.6 pg — ABNORMAL LOW (ref 26.6–33.0)
MCHC: 32 g/dL (ref 31.5–35.7)
MCV: 80 fL (ref 79–97)
PLATELETS: 414 10*3/uL — AB (ref 150–379)
RBC: 5.03 x10E6/uL (ref 3.77–5.28)
RDW: 14.4 % (ref 12.3–15.4)
WBC: 10.1 10*3/uL (ref 3.4–10.8)

## 2015-10-06 LAB — BMP8+EGFR
BUN/Creatinine Ratio: 23 (ref 9–23)
BUN: 18 mg/dL (ref 6–20)
CALCIUM: 9.7 mg/dL (ref 8.7–10.2)
CHLORIDE: 99 mmol/L (ref 96–106)
CO2: 22 mmol/L (ref 18–29)
Creatinine, Ser: 0.79 mg/dL (ref 0.57–1.00)
GFR calc Af Amer: 115 mL/min/{1.73_m2} (ref >59)
GFR calc non Af Amer: 100 mL/min/{1.73_m2} (ref >59)
GLUCOSE: 94 mg/dL (ref 65–99)
POTASSIUM: 4.7 mmol/L (ref 3.5–5.2)
Sodium: 143 mmol/L (ref 134–144)

## 2015-10-12 ENCOUNTER — Encounter (HOSPITAL_BASED_OUTPATIENT_CLINIC_OR_DEPARTMENT_OTHER): Payer: Self-pay | Admitting: *Deleted

## 2015-10-12 NOTE — Progress Notes (Addendum)
NPO AFTER MN.  ARRIVE AT 0815.  CURRENT LAB RESULTS IN CHART AND EPIC.   

## 2015-10-16 ENCOUNTER — Ambulatory Visit (HOSPITAL_BASED_OUTPATIENT_CLINIC_OR_DEPARTMENT_OTHER)
Admission: RE | Admit: 2015-10-16 | Discharge: 2015-10-16 | Disposition: A | Discharge status: Home / Self Care | Payer: Medicaid Other | Source: Ambulatory Visit | Attending: Urology | Admitting: Urology

## 2015-10-16 ENCOUNTER — Encounter (HOSPITAL_BASED_OUTPATIENT_CLINIC_OR_DEPARTMENT_OTHER): Payer: Self-pay | Admitting: *Deleted

## 2015-10-16 ENCOUNTER — Ambulatory Visit (HOSPITAL_BASED_OUTPATIENT_CLINIC_OR_DEPARTMENT_OTHER): Payer: Medicaid Other | Admitting: Anesthesiology

## 2015-10-16 ENCOUNTER — Encounter (HOSPITAL_BASED_OUTPATIENT_CLINIC_OR_DEPARTMENT_OTHER)
Admission: RE | Disposition: A | Discharge status: Home / Self Care | Payer: Self-pay | Source: Ambulatory Visit | Attending: Urology

## 2015-10-16 DIAGNOSIS — N202 Calculus of kidney with calculus of ureter: Secondary | ICD-10-CM | POA: Insufficient documentation

## 2015-10-16 DIAGNOSIS — Z791 Long term (current) use of non-steroidal anti-inflammatories (NSAID): Secondary | ICD-10-CM | POA: Diagnosis not present

## 2015-10-16 DIAGNOSIS — Z79899 Other long term (current) drug therapy: Secondary | ICD-10-CM | POA: Diagnosis not present

## 2015-10-16 DIAGNOSIS — Z87442 Personal history of urinary calculi: Secondary | ICD-10-CM | POA: Insufficient documentation

## 2015-10-16 DIAGNOSIS — Z6831 Body mass index (BMI) 31.0-31.9, adult: Secondary | ICD-10-CM | POA: Diagnosis not present

## 2015-10-16 DIAGNOSIS — E669 Obesity, unspecified: Secondary | ICD-10-CM | POA: Insufficient documentation

## 2015-10-16 HISTORY — PX: STONE EXTRACTION WITH BASKET: SHX5318

## 2015-10-16 HISTORY — DX: Personal history of urinary calculi: Z87.442

## 2015-10-16 HISTORY — PX: CYSTOSCOPY/URETEROSCOPY/HOLMIUM LASER/STENT PLACEMENT: SHX6546

## 2015-10-16 HISTORY — DX: Personal history of urinary (tract) infections: Z87.440

## 2015-10-16 HISTORY — DX: Personal history of other diseases of urinary system: Z87.448

## 2015-10-16 HISTORY — PX: HOLMIUM LASER APPLICATION: SHX5852

## 2015-10-16 HISTORY — DX: Urgency of urination: R39.15

## 2015-10-16 LAB — POCT PREGNANCY, URINE: Preg Test, Ur: NEGATIVE

## 2015-10-16 SURGERY — CYSTOSCOPY/URETEROSCOPY/HOLMIUM LASER/STENT PLACEMENT
Anesthesia: General | Site: Renal | Laterality: Right

## 2015-10-16 MED ORDER — ONDANSETRON HCL 4 MG/2ML IJ SOLN
INTRAMUSCULAR | Status: DC | PRN
  Administered 2015-10-16: 4 mg via INTRAVENOUS

## 2015-10-16 MED ORDER — MEPERIDINE HCL 25 MG/ML IJ SOLN
6.2500 mg | INTRAMUSCULAR | Status: DC | PRN
Start: 2015-10-16 — End: 2015-10-16
  Filled 2015-10-16: qty 1

## 2015-10-16 MED ORDER — DEXAMETHASONE SODIUM PHOSPHATE 10 MG/ML IJ SOLN
INTRAMUSCULAR | Status: DC | PRN
  Administered 2015-10-16: 10 mg via INTRAVENOUS

## 2015-10-16 MED ORDER — SODIUM CHLORIDE 0.9 % IR SOLN
Status: DC | PRN
  Administered 2015-10-16: 4000 mL

## 2015-10-16 MED ORDER — TRAMADOL HCL 50 MG PO TABS: 50.0000 mg | ORAL_TABLET | Freq: Four times a day (QID) | ORAL | Status: DC | PRN

## 2015-10-16 MED ORDER — LEVOFLOXACIN IN D5W 500 MG/100ML IV SOLN
INTRAVENOUS | Status: AC
  Filled 2015-10-16: qty 100

## 2015-10-16 MED ORDER — PROPOFOL 10 MG/ML IV BOLUS
INTRAVENOUS | Status: AC
  Filled 2015-10-16: qty 20

## 2015-10-16 MED ORDER — FENTANYL CITRATE (PF) 100 MCG/2ML IJ SOLN
INTRAMUSCULAR | Status: DC | PRN
  Administered 2015-10-16 (×4): 25 ug via INTRAVENOUS

## 2015-10-16 MED ORDER — PROMETHAZINE HCL 25 MG/ML IJ SOLN
6.2500 mg | INTRAMUSCULAR | Status: DC | PRN
Start: 2015-10-16 — End: 2015-10-16
  Filled 2015-10-16: qty 1

## 2015-10-16 MED ORDER — LIDOCAINE HCL (CARDIAC) 20 MG/ML IV SOLN
INTRAVENOUS | Status: DC | PRN
  Administered 2015-10-16: 80 mg via INTRAVENOUS

## 2015-10-16 MED ORDER — LEVOFLOXACIN IN D5W 500 MG/100ML IV SOLN
500.0000 mg | INTRAVENOUS | Status: DC
  Administered 2015-10-16: 500 mg via INTRAVENOUS
  Filled 2015-10-16: qty 100

## 2015-10-16 MED ORDER — NITROFURANTOIN MONOHYD MACRO 100 MG PO CAPS: 100.0000 mg | ORAL_CAPSULE | Freq: Every day | ORAL | Status: DC

## 2015-10-16 MED ORDER — LACTATED RINGERS IV SOLN
INTRAVENOUS | Status: DC
  Filled 2015-10-16: qty 1000

## 2015-10-16 MED ORDER — KETOROLAC TROMETHAMINE 30 MG/ML IJ SOLN
INTRAMUSCULAR | Status: DC | PRN
  Administered 2015-10-16: 30 mg via INTRAVENOUS

## 2015-10-16 MED ORDER — BELLADONNA ALKALOIDS-OPIUM 16.2-60 MG RE SUPP
RECTAL | Status: AC
  Filled 2015-10-16: qty 1

## 2015-10-16 MED ORDER — FENTANYL CITRATE (PF) 100 MCG/2ML IJ SOLN
INTRAMUSCULAR | Status: AC
  Filled 2015-10-16: qty 2

## 2015-10-16 MED ORDER — PROPOFOL 10 MG/ML IV BOLUS
INTRAVENOUS | Status: DC | PRN
  Administered 2015-10-16: 200 mg via INTRAVENOUS

## 2015-10-16 MED ORDER — MIDAZOLAM HCL 5 MG/5ML IJ SOLN
INTRAMUSCULAR | Status: DC | PRN
  Administered 2015-10-16: 2 mg via INTRAVENOUS

## 2015-10-16 MED ORDER — HYDROMORPHONE HCL 1 MG/ML IJ SOLN
0.2500 mg | INTRAMUSCULAR | Status: DC | PRN
  Filled 2015-10-16: qty 1

## 2015-10-16 MED ORDER — LACTATED RINGERS IV SOLN
INTRAVENOUS | Status: DC
  Administered 2015-10-16: 09:00:00 via INTRAVENOUS
  Filled 2015-10-16: qty 1000

## 2015-10-16 MED ORDER — MIDAZOLAM HCL 2 MG/2ML IJ SOLN
INTRAMUSCULAR | Status: AC
  Filled 2015-10-16: qty 2

## 2015-10-16 SURGICAL SUPPLY — 48 items
ADAPTER CATH URET PLST 4-6FR (CATHETERS) IMPLANT
ADPR CATH URET STRL DISP 4-6FR (CATHETERS)
BAG DRAIN URO-CYSTO SKYTR STRL (DRAIN) ×3 IMPLANT
BAG DRN UROCATH (DRAIN) ×1
BASKET LASER NITINOL 1.9FR (BASKET) IMPLANT
BASKET STNLS GEMINI 4WIRE 3FR (BASKET) IMPLANT
BASKET ZERO TIP NITINOL 2.4FR (BASKET) ×3 IMPLANT
BSKT STON RTRVL GEM 120X11 3FR (BASKET)
CANISTER SUCT LVC 12 LTR MEDI- (MISCELLANEOUS) IMPLANT
CATH INTERMIT  6FR 70CM (CATHETERS) IMPLANT
CATH URET 5FR 28IN CONE TIP (BALLOONS)
CATH URET 5FR 28IN OPEN ENDED (CATHETERS) IMPLANT
CATH URET 5FR 70CM CONE TIP (BALLOONS) IMPLANT
CATH URET DUAL LUMEN 6-10FR 50 (CATHETERS) IMPLANT
CLOTH BEACON ORANGE TIMEOUT ST (SAFETY) ×3 IMPLANT
ELECT REM PT RETURN 9FT ADLT (ELECTROSURGICAL)
ELECTRODE REM PT RTRN 9FT ADLT (ELECTROSURGICAL) IMPLANT
FIBER LASER TRAC TIP (UROLOGICAL SUPPLIES) ×3 IMPLANT
GLOVE BIO SURGEON STRL SZ 6.5 (GLOVE) ×2 IMPLANT
GLOVE BIO SURGEON STRL SZ7.5 (GLOVE) ×3 IMPLANT
GLOVE BIO SURGEONS STRL SZ 6.5 (GLOVE) ×1
GLOVE BIOGEL PI IND STRL 6.5 (GLOVE) ×2 IMPLANT
GLOVE BIOGEL PI IND STRL 7.0 (GLOVE) ×2 IMPLANT
GLOVE BIOGEL PI INDICATOR 6.5 (GLOVE) ×4
GLOVE BIOGEL PI INDICATOR 7.0 (GLOVE) ×4
GOWN STRL REUS W/ TWL LRG LVL3 (GOWN DISPOSABLE) ×2 IMPLANT
GOWN STRL REUS W/ TWL XL LVL3 (GOWN DISPOSABLE) ×1 IMPLANT
GOWN STRL REUS W/TWL LRG LVL3 (GOWN DISPOSABLE) ×6
GOWN STRL REUS W/TWL XL LVL3 (GOWN DISPOSABLE) ×3
GUIDEWIRE 0.038 PTFE COATED (WIRE) IMPLANT
GUIDEWIRE ANG ZIPWIRE 038X150 (WIRE) ×3 IMPLANT
GUIDEWIRE STR DUAL SENSOR (WIRE) ×3 IMPLANT
IV NS 1000ML (IV SOLUTION) ×3
IV NS 1000ML BAXH (IV SOLUTION) ×1 IMPLANT
IV NS IRRIG 3000ML ARTHROMATIC (IV SOLUTION) ×3 IMPLANT
KIT BALLIN UROMAX 15FX10 (LABEL) IMPLANT
KIT BALLN UROMAX 15FX4 (MISCELLANEOUS) IMPLANT
KIT BALLN UROMAX 26 75X4 (MISCELLANEOUS)
KIT ROOM TURNOVER WOR (KITS) ×3 IMPLANT
MANIFOLD NEPTUNE II (INSTRUMENTS) ×3 IMPLANT
PACK CYSTO (CUSTOM PROCEDURE TRAY) ×3 IMPLANT
SET HIGH PRES BAL DIL (LABEL)
SHEATH ACCESS URETERAL 24CM (SHEATH) ×3 IMPLANT
SHEATH ACCESS URETERAL 38CM (SHEATH) IMPLANT
STENT URET 6FRX24 CONTOUR (STENTS) ×3 IMPLANT
SYRINGE IRR TOOMEY STRL 70CC (SYRINGE) IMPLANT
TUBE CONNECTING 12'X1/4 (SUCTIONS)
TUBE CONNECTING 12X1/4 (SUCTIONS) IMPLANT

## 2015-10-16 NOTE — Discharge Instructions (Signed)
Ureteral Stent Implantation, Care After Refer to this sheet in the next few weeks. These instructions provide you with information on caring for yourself after your procedure. Your health care provider may also give you more specific instructions. Your treatment has been planned according to current medical practices, but problems sometimes occur. Call your health care provider if you have any problems or questions after your procedure.  REMOVAL OF THE STENT: Remove the stent on Monday morning 10/19/2015 by pulling the string with slow steady pressure until the entire stent is removed.  WHAT TO EXPECT AFTER THE PROCEDURE You should be back to normal activity within 48 hours after the procedure. Nausea and vomiting may occur and are commonly the result of anesthesia. It is common to experience sharp pain in the back or lower abdomen and penis with voiding. This is caused by movement of the ends of the stent with the act of urinating.It usually goes away within minutes after you have stopped urinating. HOME CARE INSTRUCTIONS Make sure to drink plenty of fluids. You may have small amounts of bleeding, causing your urine to be red. This is normal. Certain movements may trigger pain or a feeling that you need to urinate. You may be given medicines to prevent infection or bladder spasms. Be sure to take all medicines as directed. Only take over-the-counter or prescription medicines for pain, discomfort, or fever as directed by your health care provider. Do not take aspirin, as this can make bleeding worse.   Be sure to keep all follow-up appointments so your health care provider can check that you are healing properly.  SEEK MEDICAL CARE IF:  You experience increasing pain.  Your pain medicine is not working. SEEK IMMEDIATE MEDICAL CARE IF:  Your urine is dark red or has blood clots.  You are leaking urine (incontinent).  You have a fever, chills, feeling sick to your stomach (nausea), or  vomiting.  Your pain is not relieved by pain medicine.  The end of the stent comes out of the urethra.  You are unable to urinate.   This information is not intended to replace advice given to you by your health care provider. Make sure you discuss any questions you have with your health care provider.   Document Released: 02/13/2013 Document Revised: 06/18/2013 Document Reviewed: 12/26/2014 Elsevier Interactive Patient Education 2016 [employer].  Post Anesthesia Home Care Instructions  Activity: Get plenty of rest for the remainder of the day. A responsible adult should stay with you for 24 hours following the procedure.  For the next 24 hours, DO NOT: -Drive a car -Advertising copywriter -Drink alcoholic beverages -Take any medication unless instructed by your physician -Make any legal decisions or sign important papers.  Meals: Start with liquid foods such as gelatin or soup. Progress to regular foods as tolerated. Avoid greasy, spicy, heavy foods. If nausea and/or vomiting occur, drink only clear liquids until the nausea and/or vomiting subsides. Call your physician if vomiting continues.  Special Instructions/Symptoms: Your throat may feel dry or sore from the anesthesia or the breathing tube placed in your throat during surgery. If this causes discomfort, gargle with warm salt water. The discomfort should disappear within 24 hours.  If you had a scopolamine patch placed behind your ear for the management of post- operative nausea and/or vomiting:  1. The medication in the patch is effective for 72 hours, after which it should be removed.  Wrap patch in a tissue and discard in the trash. Wash hands thoroughly  with soap and water. 2. You may remove the patch earlier than 72 hours if you experience unpleasant side effects which may include dry mouth, dizziness or visual disturbances. 3. Avoid touching the patch. Wash your hands with soap and water after contact with the patch.

## 2015-10-16 NOTE — Anesthesia Procedure Notes (Signed)
Procedure Name: LMA Insertion Date/Time: 10/16/2015 10:24 AM Performed by: Jessica PriestBEESON, Ourania Hamler C Pre-anesthesia Checklist: Patient identified, Emergency Drugs available, Suction available and Patient being monitored Patient Re-evaluated:Patient Re-evaluated prior to inductionOxygen Delivery Method: Circle System Utilized Preoxygenation: Pre-oxygenation with 100% oxygen Intubation Type: IV induction Ventilation: Mask ventilation without difficulty LMA: LMA inserted LMA Size: 4.0 Number of attempts: 1 Airway Equipment and Method: bite block Placement Confirmation: positive ETCO2 Tube secured with: Tape Dental Injury: Teeth and Oropharynx as per pre-operative assessment

## 2015-10-16 NOTE — H&P (Signed)
H&P  Chief Complaint: right renal and ureteral stones  History of Present Illness: 30 yo s/p right urgent stent for distal stone and fever. Presents today for URS/HLL/stent exchange. She has been well. No fever or dysuria.   Past Medical History  Diagnosis Date  . Right ureteral stone   . History of acute pyelonephritis     09-27-2015  . Nephrolithiasis     bilateral  non-obstructive per ct 09-27-2015  . Urgency of urination   . History of kidney stones    Past Surgical History  Procedure Laterality Date  . Wisdom tooth extraction  03/2014  . Cystoscopy with retrograde pyelogram, ureteroscopy and stent placement Left 09/09/2014    Procedure: CYSTOSCOPY WITH LEFT RETROGRADE PYELOGRAM, URETEROSCOPY AND STONE EXTRACTION  DOUBLE J STENT PLACEMENT;  Surgeon: Jerilee FieldMatthew Vi Biddinger, MD;  Location: [facility];  Service: Urology;  Laterality: Left;  . Holmium laser application Left 09/09/2014    Procedure: HOLMIUM LASER APPLICATION;  Surgeon: Jerilee FieldMatthew Layanna Charo, MD;  Location: [facility];  Service: Urology;  Laterality: Left;  . Cystoscopy w/ ureteral stent placement Right 09/27/2015    Procedure: CYSTOSCOPY WITH RETROGRADE PYELOGRAM/URETERAL STENT PLACEMENT;  Surgeon: Hildred LaserBrian James Budzyn, MD;  Location: [facility];  Service: Urology;  Laterality: Right;    Home Medications:  Prescriptions prior to admission  Medication Sig Dispense Refill Last Dose  . acetaminophen (ACETAMINOPHEN 8 HOUR) 650 MG CR tablet Take 1 tablet (650 mg total) by mouth every 8 (eight) hours as needed for pain or fever.   10/15/2015 at Unknown time  . ibuprofen (ADVIL,MOTRIN) 200 MG tablet Take 400-800 mg by mouth every 6 (six) hours as needed for mild pain or moderate pain.   Past Week at Unknown time  . OVER THE COUNTER MEDICATION as needed (takes Urostat as needed).   Past Week at Unknown time  . Phenazopyridine HCl (AZO TABS PO) Take by mouth 3 (three) times daily as needed.   Past Week at Unknown time   Allergies:  Allergies  Allergen  Reactions  . Amoxicillin Hives    Family History  Problem Relation Age of Onset  . Hypertension Mother   . Diabetes Father   . Kidney disease Father    Social History:  reports that she has never smoked. She has never used smokeless tobacco. She reports that she does not drink alcohol or use illicit drugs.  ROS: A complete review of systems was performed.  All systems are negative except for pertinent findings as noted. ROS   Physical Exam:  Vital signs in last 24 hours: Temp:  [98.6 F (37 C)] 98.6 F (37 C) (04/21 0824) Pulse Rate:  [95] 95 (04/21 0824) Resp:  [16] 16 (04/21 0824) BP: (123)/(85) 123/85 mmHg (04/21 0824) SpO2:  [100 %] 100 % (04/21 0824) Weight:  [75.524 kg (166 lb 8 oz)] 75.524 kg (166 lb 8 oz) (04/21 0824) General:  Alert and oriented, No acute distress HEENT: Normocephalic, atraumatic Lungs: Regular rate and effort Extremities: No edema Neurologic: Grossly intact  Laboratory Data:  Results for orders placed or performed during the hospital encounter of 10/16/15 (from the past 24 hour(s))  Pregnancy, urine POC     Status: None   Collection Time: 10/16/15  9:08 AM  Result Value Ref Range   Preg Test, Ur NEGATIVE NEGATIVE   No results found for this or any previous visit (from the past 240 hour(s)). Creatinine: No results for input(s): CREATININE in the last 168 hours.  Impression/Assessment:  Right ureteral and  renal stone   Plan:  I discussed with the patient the nature, potential benefits, risks and alternatives to right URS/HLL/stent, including side effects of the proposed treatment, the likelihood of the patient achieving the goals of the procedure, and any potential problems that might occur during the procedure or recuperation. All questions answered. Patient elects to proceed.   Abir Eroh 10/16/2015, 10:09 AM

## 2015-10-16 NOTE — Op Note (Signed)
Preoperative diagnosis: Right ureteral stone, right renal stone Postoperative diagnosis: Same  Procedure: Cystoscopy, right ureteroscopy, holmium laser lithotripsy, stone basket extraction, stent exchange  Surgeon: Mena GoesEskridge  Anesthesia: Gen.  Indication for procedure: 30 year old with a right ureteral and renal stone When urgency stent a few weeks ago for a fever. She presents today for definitive stone management.  Findings: Stone in right distal ureter, stone and right renal pelvis  Description of procedure: After consent was obtained patient brought to the operating room. After adequate anesthesia she is placed in lithotomy position and prepped and draped in the usual sterile fashion. A timeout was performed to confirm the patient and procedure. The cystoscope was passed per urethra and the bladder irrigated several times. The right ureteral stent was grasped and removed through the urethral meatus and a sensor wire was advanced and coiled in the kidney. A semirigid ureteroscope was advanced into the right ureter where a distal stone was noted it was surrounded with a 0 tip basket and removed without difficulty. I then passed a second wire and over it a digital ureteroscope where I found the renal stone in the renal pelvis. I inspected the other calyces and found no other stones. The stone was trapped with a 0 tip basket and made it all the way to the iliacs but would not pass over. Therefore I moved it retrograde and dropped in the upper pole and broke it at 0.8 and 8 with a 200  laser fiber. Because there were several pieces a past the second wire and removed the scope. Over the second wire passed a short access sheath without difficulty. I then repassed the ureteroscope and all the pieces were removed. There was some small fragments which could not be basketed. These were a millimeter or less in size. I inspected the collecting system and renal pelvis again there were no other stones. The renal  pelvis and the ureter were normal on final inspection and in the access sheath and scope were backed out together in the remainder the ureter was inspected and noted to be normal. The wire was then backloaded on the cystoscope and a 6 x 24 cm ureteral stent was advanced. The wire was removed with good coil seen in the kidney and a good coil in the bladder. The bladder was drained and the scope removed. She was awakened and taken to recovery room in stable condition.  Complications: None Blood loss: 0 mL Specimens: Stone fragments to office lab  Drains: 6 x 24 cm right ureteral stent with string

## 2015-10-16 NOTE — Anesthesia Preprocedure Evaluation (Addendum)
Anesthesia Evaluation  Patient identified by MRN, date of birth, ID band Patient awake    Reviewed: Allergy & Precautions, NPO status , Patient's Chart, lab work & pertinent test results  Airway Mallampati: I  TM Distance: >3 FB Neck ROM: Full    Dental  (+) Teeth Intact   Pulmonary neg pulmonary ROS,    breath sounds clear to auscultation       Cardiovascular negative cardio ROS   Rhythm:Regular Rate:Normal     Neuro/Psych negative neurological ROS  negative psych ROS   GI/Hepatic negative GI ROS, Neg liver ROS,   Endo/Other  negative endocrine ROS  Renal/GU Renal disease  negative genitourinary   Musculoskeletal negative musculoskeletal ROS (+)   Abdominal (+) + obese,  Abdomen: soft.    Peds negative pediatric ROS (+)  Hematology negative hematology ROS (+)   Anesthesia Other Findings   Reproductive/Obstetrics negative OB ROS                            Anesthesia Physical Anesthesia Plan  ASA: II  Anesthesia Plan: General   Post-op Pain Management:    Induction: Intravenous  Airway Management Planned: LMA  Additional Equipment:   Intra-op Plan:   Post-operative Plan: Extubation in OR  Informed Consent: I have reviewed the patients History and Physical, chart, labs and discussed the procedure including the risks, benefits and alternatives for the proposed anesthesia with the patient or authorized representative who has indicated his/her understanding and acceptance.   Dental advisory given  Plan Discussed with: CRNA  Anesthesia Plan Comments:         Anesthesia Quick Evaluation

## 2015-10-16 NOTE — Transfer of Care (Signed)
Last Vitals:  Filed Vitals:   10/16/15 0824  BP: 123/85  Pulse: 95  Temp: 37 C  Resp: 16    Immediate Anesthesia Transfer of Care Note  Patient: Lauren Cardenas  Procedure(s) Performed: Procedure(s) (LRB): RIGHT URETEROSCOPY/HOLMIUM LASER/STENT PLACEMENT (Right) HOLMIUM LASER APPLICATION (Right) STONE EXTRACTION WITH BASKET (Right)  Patient Location: PACU  Anesthesia Type: General  Level of Consciousness: awake, alert  and oriented  Airway & Oxygen Therapy: Patient Spontanous Breathing and Patient connected to nasal cannula oxygen  Post-op Assessment: Report given to PACU RN and Post -op Vital signs reviewed and stable  Post vital signs: Reviewed and stable  Complications: No apparent anesthesia complications

## 2015-10-16 NOTE — Anesthesia Postprocedure Evaluation (Signed)
Anesthesia Post Note  Patient: Lauren Cardenas  Procedure(s) Performed: Procedure(s) (LRB): RIGHT URETEROSCOPY/HOLMIUM LASER/STENT PLACEMENT (Right) HOLMIUM LASER APPLICATION (Right) STONE EXTRACTION WITH BASKET (Right)  Patient location during evaluation: PACU Anesthesia Type: General Level of consciousness: awake and alert Pain management: pain level controlled Vital Signs Assessment: post-procedure vital signs reviewed and stable Respiratory status: spontaneous breathing, nonlabored ventilation, respiratory function stable and patient connected to nasal cannula oxygen Cardiovascular status: blood pressure returned to baseline and stable Postop Assessment: no signs of nausea or vomiting Anesthetic complications: no    Last Vitals:  Filed Vitals:   10/16/15 1145 10/16/15 1200  BP: 117/79 118/72  Pulse: 91 79  Temp:    Resp: 15 11    Last Pain:  Filed Vitals:   10/16/15 1204  PainSc: 0-No pain                 Shelton SilvasKevin D Nainoa Woldt

## 2015-10-19 ENCOUNTER — Encounter (HOSPITAL_BASED_OUTPATIENT_CLINIC_OR_DEPARTMENT_OTHER): Payer: Self-pay | Admitting: Urology

## 2015-11-14 DIAGNOSIS — N3281 Overactive bladder: Secondary | ICD-10-CM | POA: Insufficient documentation

## 2016-03-21 ENCOUNTER — Encounter: Payer: Self-pay | Admitting: Neurology

## 2016-03-21 ENCOUNTER — Ambulatory Visit (INDEPENDENT_AMBULATORY_CARE_PROVIDER_SITE_OTHER): Payer: Medicaid Other | Admitting: Neurology

## 2016-03-21 ENCOUNTER — Encounter: Payer: Self-pay | Admitting: *Deleted

## 2016-03-21 VITALS — BP 139/82 | HR 92 | Ht 61.0 in | Wt 174.0 lb

## 2016-03-21 DIAGNOSIS — H539 Unspecified visual disturbance: Secondary | ICD-10-CM | POA: Diagnosis not present

## 2016-03-21 DIAGNOSIS — H9193 Unspecified hearing loss, bilateral: Secondary | ICD-10-CM

## 2016-03-21 DIAGNOSIS — R51 Headache with orthostatic component, not elsewhere classified: Secondary | ICD-10-CM

## 2016-03-21 DIAGNOSIS — H4710 Unspecified papilledema: Secondary | ICD-10-CM | POA: Diagnosis not present

## 2016-03-21 DIAGNOSIS — R519 Headache, unspecified: Secondary | ICD-10-CM

## 2016-03-21 DIAGNOSIS — H05119 Granuloma of unspecified orbit: Secondary | ICD-10-CM | POA: Diagnosis not present

## 2016-03-21 NOTE — Progress Notes (Signed)
[facility]    Provider:  Dr Lucia GaskinsAhern Referring Provider: Dr. Despina AriasYen Le OD Primary Care Physician:  Bennie PieriniMary-Margaret Martin, FNP  CC:  Headache and swollen optic nerves  HPI:  Lauren CurlingRachael D Cardenas is a 30 y.o. female here as a referral from Dr. Dr. Despina AriasYen Le OD for headaches. Past medical history of kidney stones. Patient has headaches that started 3-4 months ago and she has headaches 20-25 days a month. Pressure around the head. She has ringing in the ears and blurry vision and has to refocus. The headaches last up to 2-3 hours multiple times a day. They can be 6/10 on average. No light or sound sensitivity. Tylenol may help a little. Hydrocodone may help. She has had multiple kidney stones and she has had surgery to remove them. No medication overuse headaches. No inciting events to the headaches, no head trauma no other inciting events. No other modifying factors or associated symptoms. No weight gain. No hx of Mirena IUD, Tetracycline or accutane. No previous illnesses to the headaches, head trauma, new medication. The headaches are worse as the day progresses. Bending ober cleaning makes it worse. No FHX of migraines.   Reviewed notes, labs and imaging from outside physicians, which showed:  Patient was seen at [facility].Dr. Despina AriasYen Le OD thus corrected visual acuities were 20/20 in the right eye and 20/20 in the left eye at distance and near. Extraocular movements were full and unrestricted. Pupils were equally round and reactive to light with no APD. 15 mmHg in the right eye and left eye. Slit lamp findings were unremarkable. Dilated fundus examination showed mild optic nerve head edema bilaterally. No venous pulses noted in both eyes. No findings of optic nerve head pallor or optic nerve hemorrhages. 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 The macula  was clear. Peripheral redness of both eyes were normal. Patient has mild optic nerve edema both eyes. Personally reviewed the Humphrey visual field data.  CT of the head 01/07/2002 (report only):  FINDINGS CLINICAL DATA:  HIT BACK OF HEAD AND SYNCOPE. CT HEAD WITHOUT CONTRAST 01/04/02 AT 1530 HOURS COMPARISON NONE. FINDINGS:  THE BRAIN PARENCHYMA, VENTRICULAR SYSTEM AND EXTRAAXIAL SPACE ARE WITHIN NORMAL LIMITS. THERE IS NO MASS EFFECT, MIDLINE SHIFT OR HEMORRHAGE.  THERE IS NEAR TOTAL OPACIFICATION OF THE LEFT MAXILLARY SINUS WITH MUCUS MATERIAL AS WELL AS FLUID.  A SMALL AMOUNT OF MUCOSAL THICKENING IS NOTED IN THE POSTERIOR ETHMOID SINUSES.  THE MASTOID AIR CELLS ARE CLEAR. IMPRESSION 1.  NEAR TOTAL OPACIFICATION OF THE LEFT MAXILLARY SINUS WITH FLUID.  ACUTE SINUSITIS IS A CONSIDERATION. 2.  OTHERWISE NO EVIDENCE OF ACUTE INTRACRANIAL PATHOLOGY.  Review of Systems: Patient complains of symptoms per HPI as well as the following symptoms: Fevers, fatigue, eye pain, feeling hot, feeling cold, snoring, joint pain, aching muscles, headache, sleepiness, snoring, decreased energy Pertinent negatives per HPI. All others negative.   Social History   Social History  . Marital status: Married    Spouse name: Lauren Cardenas  . Number of children: 1  . Years of education: 7412   Occupational History  . [employer]    Social History Main Topics  . Smoking status: Never Smoker  . Smokeless tobacco: Never Used  . Alcohol use No  . Drug use: No  . Sexual activity: Yes   Other Topics Concern  .  Not on file   Social History Narrative   Lives with husband and daughter and in-laws   Caffeine use: 100-150mg /week    Family History  Problem Relation Age of Onset  . Hypertension Mother   . Diabetes Father   . Kidney disease Father     Past Medical History:  Diagnosis Date  . History of acute pyelonephritis    09-27-2015  . History of kidney stones   . Nephrolithiasis     bilateral  non-obstructive per ct 09-27-2015  . Right ureteral stone   . Urgency of urination     Past Surgical History:  Procedure Laterality Date  . CYSTOSCOPY W/ URETERAL STENT PLACEMENT Right 09/27/2015   Procedure: CYSTOSCOPY WITH RETROGRADE PYELOGRAM/URETERAL STENT PLACEMENT;  Surgeon: Hildred Laser, MD;  Location: [facility];  Service: Urology;  Laterality: Right;  . CYSTOSCOPY WITH RETROGRADE PYELOGRAM, URETEROSCOPY AND STENT PLACEMENT Left 09/09/2014   Procedure: CYSTOSCOPY WITH LEFT RETROGRADE PYELOGRAM, URETEROSCOPY AND STONE EXTRACTION  DOUBLE J STENT PLACEMENT;  Surgeon: Jerilee Field, MD;  Location: [facility];  Service: Urology;  Laterality: Left;  . CYSTOSCOPY/URETEROSCOPY/HOLMIUM LASER/STENT PLACEMENT Right 10/16/2015   Procedure: RIGHT URETEROSCOPY/HOLMIUM LASER/STENT PLACEMENT;  Surgeon: Jerilee Field, MD;  Location: [facility];  Service: Urology;  Laterality: Right;  . HOLMIUM LASER APPLICATION Left 09/09/2014   Procedure: HOLMIUM LASER APPLICATION;  Surgeon: Jerilee Field, MD;  Location: [facility];  Service: Urology;  Laterality: Left;  . HOLMIUM LASER APPLICATION Right 10/16/2015   Procedure: HOLMIUM LASER APPLICATION;  Surgeon: Jerilee Field, MD;  Location: [facility];  Service: Urology;  Laterality: Right;  . STONE EXTRACTION WITH BASKET Right 10/16/2015   Procedure: STONE EXTRACTION WITH BASKET;  Surgeon: Jerilee Field, MD;  Location: [facility];  Service: Urology;  Laterality: Right;  . WISDOM TOOTH EXTRACTION  03/2014    Current Outpatient Prescriptions  Medication Sig Dispense Refill  . acetaminophen (TYLENOL) 500 MG tablet Take 1,000 mg by mouth every 6 (six) hours as needed.    . cetirizine (ZYRTEC ALLERGY) 10 MG tablet Take 10 mg by mouth daily.    . cycloSPORINE (RESTASIS) 0.05 % ophthalmic emulsion Place 1 drop into both eyes 2 (two) times daily.    . fluticasone (FLONASE) 50 MCG/ACT nasal spray Place 1  spray into both nostrils as needed for allergies or rhinitis.    Marland Kitchen HYDROcodone-acetaminophen (NORCO) 7.5-325 MG tablet Take 1 tablet by mouth every 6 (six) hours as needed for moderate pain.    Marland Kitchen ibuprofen (ADVIL,MOTRIN) 200 MG tablet Take 400-800 mg by mouth 3 (three) times daily.     . Olopatadine HCl (PAZEO) 0.7 % SOLN Apply 1 drop to eye daily.    . nitrofurantoin, macrocrystal-monohydrate, (MACROBID) 100 MG capsule Take 1 capsule (100 mg total) by mouth at bedtime. (Patient not taking: Reported on 03/21/2016) 5 capsule 0  . promethazine (PHENERGAN) 25 MG tablet Take 25 mg by mouth as needed for nausea or vomiting.    . tamsulosin (FLOMAX) 0.4 MG CAPS capsule Take 0.4 mg by mouth as needed.    . traMADol (ULTRAM) 50 MG tablet Take 1 tablet (50 mg total) by mouth every 6 (six) hours as needed. (Patient not taking: Reported on 03/21/2016) 20 tablet 0   No current facility-administered medications for this visit.     Allergies as of 03/21/2016 - Review Complete 03/21/2016  Allergen Reaction Noted  . Amoxicillin Hives 06/12/2014    Vitals: BP 139/82 (BP Location: Right Arm, Patient Position:  Sitting, Cuff Size: Normal)   Pulse 92   Ht 5\' 1"  (1.549 m)   Wt 174 lb (78.9 kg)   BMI 32.88 kg/m  Last Weight:  Wt Readings from Last 1 Encounters:  03/21/16 174 lb (78.9 kg)   Last Height:   Ht Readings from Last 1 Encounters:  03/21/16 5\' 1"  (1.549 m)    Physical exam: Exam: Gen: NAD, conversant, well nourised, obese, well groomed                     CV: RRR, no MRG. No Carotid Bruits. No peripheral edema, warm, nontender Eyes: Conjunctivae clear without exudates or hemorrhage  Neuro: Detailed Neurologic Exam  Speech:    Speech is normal; fluent and spontaneous with normal comprehension.  Cognition:    The patient is oriented to person, place, and time;     recent and remote memory intact;     language fluent;     normal attention, concentration,     fund of knowledge Cranial  Nerves:    The pupils are equal, round, and reactive to light. +1 optic disk edema. Visual fields are full to finger confrontation. Extraocular movements are intact. Trigeminal sensation is intact and the muscles of mastication are normal. The face is symmetric. The palate elevates in the midline. Hearing intact. Voice is normal. Shoulder shrug is normal. The tongue has normal motion without fasciculations.   Coordination:    Normal finger to nose and heel to shin.   Gait:   Normal native gait  Motor Observation:    No asymmetry, no atrophy, and no involuntary movements noted. Tone:    Normal muscle tone.    Posture:    Posture is normal. normal erect    Strength:    Strength is V/V in the upper and lower limbs.      Sensation: intact to LT     Reflex Exam:  DTR's:    Deep tendon reflexes in the upper and lower extremities are normal bilaterally.   Toes:    The toes are downgoing bilaterally.   Clonus:    Clonus is absent.      Assessment/Plan:  30 year old with optic nerve head edema, new onset headaches, vision and hearing changes.  MRI brain w/wo contrast MRV of the head MRI orbits Lumbar puncture If IIH need to discuss management as patient has significant Hx of kidney stones Referral to [facility] if IIH  Discussed weight management if this is pseudotumor cerebri, weight management will be critical in this patient especially given that she may not be able to take Diamox or Topamax due to past medical history of kidney stones. Discussed the possibility of permanent vision loss. Today her vision is normal if her vision worsens at all she is to proceed to the emergency room directly. Discussed with patient and she acknowledged understanding.  Discussed: To prevent or relieve headaches, try the following: Cool Compress. Lie down and place a cool compress on your head.  Avoid headache triggers. If certain foods or odors seem to have triggered your migraines in the  past, avoid them. A headache diary might help you identify triggers.  Include physical activity in your daily routine. Try a daily walk or other moderate aerobic exercise.  Manage stress. Find healthy ways to cope with the stressors, such as delegating tasks on your to-do list.  Practice relaxation techniques. Try deep breathing, yoga, massage and visualization.  Eat regularly. Eating regularly scheduled meals and  maintaining a healthy diet might help prevent headaches. Also, drink plenty of fluids.  Follow a regular sleep schedule. Sleep deprivation might contribute to headaches Consider biofeedback. With this mind-body technique, you learn to control certain bodily functions - such as muscle tension, heart rate and blood pressure - to prevent headaches or reduce headache pain.    Proceed to emergency room if you experience new or worsening symptoms or symptoms do not resolve, if you have new neurologic symptoms or if headache is severe, or for any concerning symptom.    Naomie Dean, MD  [facility] [address] [place], [place] [address]  Phone [phone] Fax [phone]

## 2016-03-21 NOTE — Patient Instructions (Addendum)
Remember to drink plenty of fluid, eat healthy meals and do not skip any meals. Try to eat protein with a every meal and eat a healthy snack such as fruit or nuts in between meals. Try to keep a regular sleep-wake schedule and try to exercise daily, particularly in the form of walking, 20-30 minutes a day, if you can.   As far as diagnostic testing: MRI of brain, orbits and venous system of the brain, lumbar puncture  I would like to see you back in 6 weeks, sooner if we need to. Please call us with any interim questions, concerns, problems, updates or refill requests.   Our phone number is [phone]. We also have an after hours call service for urgent matters and there is a physician on-call for urgent questions. For any emergencies you know to call 911 or go to the nearest emergency room  Pseudotumor Cerebri Pseudotumor cerebri, also called idiopathic intracranial hypertension, is a condition that occurs due to increased pressure within your skull. Although some of the symptoms resemble those of a brain tumor, it is not a brain tumor. Symptoms occur when the increased pressure in your skull compresses brain structures. For example, pressure on the nerve responsible for vision (optic nerve) causes it to swell, resulting in visual symptoms. Pseudotumor cerebri tends to occur in obese women younger than 32 years of age. However, men and children can also develop this condition. SYMPTOMS  Symptoms of pseudotumor cerebri occur due to increased pressure within the skull. Symptoms may include:   Headaches.  Nausea and vomiting.  Dizziness.  High blood pressure.   Ringing in the ears.  Double or blurred vision.  Brief episodes of complete loss of vision.  Pain in the back, neck, or shoulders. DIAGNOSIS  Pseudotumor cerebri is diagnosed through:  A detailed eye exam, which can reveal a swollen optic nerve, as well as identifying issues such as blind spots in the vision.  An MRI or CT  scan to rule out other disorders that can cause similar symptoms, such as brain tumors.  A spinal tap (lumbar puncture), which can demonstrate increased pressure within the skull. TREATMENT  There are several ways that pseudotumor cerebri is treated, including:  Medicines to decrease the production of spinal fluid and lower the pressure within your skull.  Medicines to prevent or treat headaches.  Surgery to create an opening in your optic nerve to allow excess fluid to drain out.  Surgery to place drains (shunts) in your brain to remove excess fluid. HOME CARE INSTRUCTIONS   Take all medicines as directed by your health care provider.  Go to all of your follow-up appointments.  Lose weight if you are overweight. SEEK MEDICAL CARE IF:  Any symptoms come back.  You develop trouble with hearing, vision, balance, or your sense of smell.  You cannot eat or drink what you need.  You are more weak or tired than usual.   You are losing weight without trying. SEEK IMMEDIATE MEDICAL CARE IF:  You have new symptoms such as vision problems or difficulty walking.   You have a seizure.   You have trouble breathing.   You have a fever.    This information is not intended to replace advice given to you by your health care provider. Make sure you discuss any questions you have with your health care provider.   Document Released: 06/18/2013 Document Reviewed: 06/18/2013 Elsevier Interactive Patient Education [employer]2016 Elsevier Inc.

## 2016-03-21 NOTE — Progress Notes (Signed)
Faxed Dr Trevor MaceAhern's office note from today back to referring provider Dr Conley RollsLe. Fax: [phone]. Received confirmation.

## 2016-03-28 ENCOUNTER — Other Ambulatory Visit (INDEPENDENT_AMBULATORY_CARE_PROVIDER_SITE_OTHER): Payer: Self-pay

## 2016-03-28 ENCOUNTER — Other Ambulatory Visit: Payer: Self-pay | Admitting: Neurology

## 2016-03-28 DIAGNOSIS — H4710 Unspecified papilledema: Secondary | ICD-10-CM

## 2016-03-28 DIAGNOSIS — Z0289 Encounter for other administrative examinations: Secondary | ICD-10-CM

## 2016-03-29 ENCOUNTER — Telehealth: Payer: Self-pay | Admitting: *Deleted

## 2016-03-29 LAB — BASIC METABOLIC PANEL
BUN/Creatinine Ratio: 17 (ref 9–23)
BUN: 14 mg/dL (ref 6–20)
CO2: 25 mmol/L (ref 18–29)
CREATININE: 0.83 mg/dL (ref 0.57–1.00)
Calcium: 9 mg/dL (ref 8.7–10.2)
Chloride: 100 mmol/L (ref 96–106)
GFR calc Af Amer: 109 mL/min/{1.73_m2} (ref >59)
GFR calc non Af Amer: 94 mL/min/{1.73_m2} (ref >59)
Glucose: 126 mg/dL — ABNORMAL HIGH (ref 65–99)
Potassium: 4.8 mmol/L (ref 3.5–5.2)
SODIUM: 141 mmol/L (ref 134–144)

## 2016-03-29 NOTE — Telephone Encounter (Signed)
-----   Message from Anson FretAntonia B Ahern, MD sent at 03/29/2016  8:46 AM EDT ----- Bmp nml

## 2016-03-29 NOTE — Telephone Encounter (Signed)
Called and spoke to patient about lab results per Dr Lucia GaskinsAhern. Pt verbalized understanding. She has no further questions at this time.

## 2016-04-01 ENCOUNTER — Ambulatory Visit
Admission: RE | Admit: 2016-04-01 | Discharge: 2016-04-01 | Disposition: A | Discharge status: Home / Self Care | Payer: Medicaid Other | Source: Ambulatory Visit | Attending: Neurology | Admitting: Neurology

## 2016-04-01 ENCOUNTER — Other Ambulatory Visit: Payer: Medicaid Other

## 2016-04-01 DIAGNOSIS — H9193 Unspecified hearing loss, bilateral: Secondary | ICD-10-CM

## 2016-04-01 DIAGNOSIS — H539 Unspecified visual disturbance: Secondary | ICD-10-CM

## 2016-04-01 DIAGNOSIS — H4710 Unspecified papilledema: Secondary | ICD-10-CM | POA: Diagnosis not present

## 2016-04-01 DIAGNOSIS — R51 Headache with orthostatic component, not elsewhere classified: Secondary | ICD-10-CM

## 2016-04-01 DIAGNOSIS — R519 Headache, unspecified: Secondary | ICD-10-CM

## 2016-04-01 MED ORDER — GADOBENATE DIMEGLUMINE 529 MG/ML IV SOLN
15.0000 mL | Freq: Once | INTRAVENOUS | Status: AC | PRN
  Administered 2016-04-01: 15 mL via INTRAVENOUS

## 2016-04-04 ENCOUNTER — Telehealth: Payer: Self-pay | Admitting: Neurology

## 2016-04-04 ENCOUNTER — Other Ambulatory Visit: Payer: Self-pay | Admitting: Neurology

## 2016-04-04 ENCOUNTER — Ambulatory Visit
Admission: RE | Admit: 2016-04-04 | Discharge: 2016-04-04 | Disposition: A | Discharge status: Home / Self Care | Payer: Medicaid Other | Source: Ambulatory Visit | Attending: Neurology | Admitting: Neurology

## 2016-04-04 DIAGNOSIS — R51 Headache with orthostatic component, not elsewhere classified: Secondary | ICD-10-CM

## 2016-04-04 DIAGNOSIS — H9193 Unspecified hearing loss, bilateral: Secondary | ICD-10-CM

## 2016-04-04 DIAGNOSIS — R519 Headache, unspecified: Secondary | ICD-10-CM

## 2016-04-04 DIAGNOSIS — H539 Unspecified visual disturbance: Secondary | ICD-10-CM

## 2016-04-04 DIAGNOSIS — H05119 Granuloma of unspecified orbit: Secondary | ICD-10-CM

## 2016-04-04 DIAGNOSIS — H4710 Unspecified papilledema: Secondary | ICD-10-CM

## 2016-04-04 LAB — CSF CELL COUNT WITH DIFFERENTIAL
RBC Count, CSF: 0 cells/uL (ref 0–10)
WBC, CSF: 1 cells/uL (ref 0–5)

## 2016-04-04 LAB — GLUCOSE, CSF: GLUCOSE CSF: 51 mg/dL (ref 43–76)

## 2016-04-04 LAB — PROTEIN, CSF: TOTAL PROTEIN, CSF: 22 mg/dL (ref 15–45)

## 2016-04-04 NOTE — Discharge Instructions (Signed)

## 2016-04-04 NOTE — Telephone Encounter (Signed)
The MRI of the brain was normal, no indication of increased pressure. Opening pressure was 21.  I don;t think this is IIH. Discussed multiple other medications with patient. She has a history of kidney stones by would not start topiramate for her headaches. We can start propranolol for amitriptyline/nortriptyline. Discussed with patient will try propranolol first. Discussed:   Common propranolol side effects may include:nausea, vomiting, diarrhea, constipation, stomach cramps; decreased sex drive, impotence, or difficulty having an orgasm;sleep problems (insomnia); or tired feeling. Do NOT get pregnant on this medication. Stop immediately for: slow or uneven heartbeats;a light-headed feeling, like you might pass out;wheezing or trouble breathing;shortness of breath (even with mild exertion), swelling, rapid weight gain;.sudden weakness, vision problems, or loss of coordination (especially in a child with hemangioma that affects the face or head);cold feeling in your hands and feet;depression, confusion, hallucinations;liver problems - nausea, upper stomach pain, itching, tired feeling, loss of appetite, dark urine, clay-colored stools, jaundice (yellowing of the skin or eyes);low blood sugar - headache, hunger, weakness, sweating, confusion, irritability, dizziness, fast heart rate, or feeling jittery;low blood sugar in a baby - pale skin, blue or purple skin, sweating, fussiness, crying, not wanting to eat, feeling cold, drowsiness, weak or shallow breathing (breathing may stop for short periods), seizure (convulsions), or loss of consciousness; or severe skin reaction - fever, sore throat, swelling in your face or tongue, burning in your eyes, skin pain, followed by a red or purple skin rash that spreads (especially in the face or upper body) and causes blistering and peeling.

## 2016-04-06 ENCOUNTER — Other Ambulatory Visit: Payer: Self-pay | Admitting: Neurology

## 2016-04-06 ENCOUNTER — Telehealth: Payer: Self-pay

## 2016-04-06 ENCOUNTER — Telehealth: Payer: Self-pay | Admitting: Neurology

## 2016-04-06 MED ORDER — PROPRANOLOL HCL 10 MG PO TABS: ORAL_TABLET | ORAL | 8 refills | Status: DC

## 2016-04-06 NOTE — Telephone Encounter (Signed)
Called and LVM for pt to call back. Advised that she can go back to work tomorrow unless headache positional. Advised patient that this is if her HA worsens when she goes to sit or stand and this could be post LP headache.   Advised we do close at 5pm.

## 2016-04-06 NOTE — Telephone Encounter (Signed)
Dr Lucia GaskinsAhern- please advise. Looks like you spoke to pt about starting propranolol? I do not see on med list. Also, how much excedrin would you like pt to take in 24 hr? If at all?

## 2016-04-06 NOTE — Telephone Encounter (Signed)
Called pt back. Patient unsure if headache is positional. She states her ears are ringing. Constant pressure in her head. She was at work all day today. Unable to lay down to see if this helped headache. She is going to take work off tomorrow and see if headache improves. She will call if she feels she needs a blood patch tomorrow. Advised patient to make sure to drinks plenty of fluids. She verbalized understanding.

## 2016-04-06 NOTE — Telephone Encounter (Signed)
Spoke with patient at work; asked her how she is feeling after LP here 04/04/16.   She reports on again/off again headache that "isn't too bad."  She is waiting for Dr. Daisy BlossomAhearn to phone her in a prescription for propanolol.  She is at work today and doing alright.  Tomasa Blasejkl

## 2016-04-06 NOTE — Telephone Encounter (Signed)
Called and spoke to pt. Relayed Dr Trevor MaceAhern's message. She verbalized understanding and has no further questions at this time.

## 2016-04-06 NOTE — Telephone Encounter (Signed)
Pt returned call states she has been at work today and feels she may have jumped into working to fast. May call [phone] ,work.

## 2016-04-06 NOTE — Telephone Encounter (Signed)
I called it in. I do not recommend more than 2-3 times a week for excedrin. If she uses OTC meds daily it can worsen headaches ie rebound headache. thanks

## 2016-04-06 NOTE — Telephone Encounter (Signed)
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 She has had a headache intermittently today and she did have a lumbar puncture this past Monday. Please call and advise.

## 2016-04-06 NOTE — Telephone Encounter (Signed)
Patient called, states she had lumbar puncture Monday October 9th and that Dr. Lucia GaskinsAhern was going to call in a medication for her (patient doesn't remember the name of medicine), pharmacy hasn't received Rx. Patient would like to ask, how many Excedrin Migraine can she take in a day? Please call to advise.

## 2016-04-11 NOTE — Telephone Encounter (Signed)
Pt called in stating she still has a migraine, ringing ears and her neck and back pain. She wants to know about getting a blood patch. Please call and advise [phone]

## 2016-04-11 NOTE — Telephone Encounter (Signed)
Called pt and discussed with her over the phone. She had LP last Monday and HA stated on Wednesday, not quit positional related, she went to work Wednesday to Friday and she still has HA and on and off, can be relieved without lying flat. However, she said if she lying down sometimes the HA went away right away but sometimes did not work that way. Over the weekend, remained same. In the morning HA is better and getting worse as time goes by during the day. The HA is somewhat mild, comes and goes, at the back and behind eyes and ears. With back pain including upper back and lower back.   Told her that her HA may be mild LP related HA, or tension HA. Asked her to restrict bed rest for today and tomorrow, drink plenty of caffenine containing fluid, take tylenol for HA and see if it improves. I will also send note to Dr. Lucia GaskinsAhern and to see if she has any further suggestions. Pt expressed understanding and appreciation.  Marvel PlanJindong Arnola Crittendon, MD PhD Stroke Neurology 04/11/2016 10:16 AM

## 2016-04-11 NOTE — Telephone Encounter (Signed)
Dr Roda ShuttersXu- please advise. You are the work in doctor this am

## 2016-04-12 MED ORDER — INDOMETHACIN 25 MG PO CAPS: 25.0000 mg | ORAL_CAPSULE | Freq: Three times a day (TID) | ORAL | 0 refills | Status: DC

## 2016-04-12 NOTE — Telephone Encounter (Signed)
Lauren Cardenas, if patient having a positional headache and has not gotten a blood patch we should try that, let me know today thanks

## 2016-04-12 NOTE — Telephone Encounter (Signed)
Patient called to advise, she is having nausea, still having ringing in ears, gets headache when bending forward, feels a little dizzy, "I feel all around crappy", has low grade fever of 100.1, please call to advise.

## 2016-04-12 NOTE — Telephone Encounter (Signed)
Called and spoke to patient. She stated he headache has improved. She only has a slight one this am. She was on bed rest yesterday per Dr Roda Shuttersxu recommendation. She will continue that today. She took tylenol yesterday and last night. She has not taken anything this am. Advised her to take tylenol and to continue to drink plenty of fluids, especially with caffeine per Dr Roda ShuttersXu. She will call if headache worsens.  She denies headache being positional. States it does not get worse if she sits or stands. She states yesterday, she was having headache even laying down.

## 2016-04-12 NOTE — Telephone Encounter (Signed)
Called and spoke to pt. Relayed per Dr Epimenio FootSater that he thinks sx are more from low grade fever. Continue to drink plenty of fluid. He is calling in rx indomethacin an anti-inflammatory.  Sent rx indomethacin to pharmacy electronically. Per RS, MD VO: 25mg , quantity 12, 1 capsule by mouth TID. No refills.  Advised pt to call if sx do not improve. If fever worsens, advised pt to proceed to urgent care and call to let us know. She may have a a viral infection. She verbalized understanding.

## 2016-04-12 NOTE — Addendum Note (Signed)
Addended by: Hillis RangeKING, Lyrik Buresh L on: 04/12/2016 05:05 PM   Modules accepted: Orders

## 2016-05-03 ENCOUNTER — Encounter: Payer: Self-pay | Admitting: Neurology

## 2016-05-03 ENCOUNTER — Ambulatory Visit (INDEPENDENT_AMBULATORY_CARE_PROVIDER_SITE_OTHER): Payer: Medicaid Other | Admitting: Neurology

## 2016-05-03 VITALS — BP 118/80 | HR 78 | Ht 61.0 in | Wt 177.0 lb

## 2016-05-03 DIAGNOSIS — G43009 Migraine without aura, not intractable, without status migrainosus: Secondary | ICD-10-CM

## 2016-05-03 MED ORDER — PROPRANOLOL HCL ER 60 MG PO CP24: 60.0000 mg | ORAL_CAPSULE | Freq: Every day | ORAL | 6 refills | Status: DC

## 2016-05-03 MED ORDER — SUMATRIPTAN SUCCINATE 100 MG PO TABS: 100.0000 mg | ORAL_TABLET | Freq: Once | ORAL | 12 refills | Status: DC

## 2016-05-03 NOTE — Progress Notes (Signed)
[facility]   Provider:  Dr Lucia Gaskins Referring Provider: Dr. Despina Arias OD Primary Care Physician:  Bennie Pierini, FNP  CC:  Headache and swollen optic nerves  Interval history 05/03/2016: MRI of the brain was normal, opening pressure was 21 so was not started on Topamax or Diamox due to past medical history of kidney stones. The propranolol is helping, headaches cut in half. They also don't last as long. She is on 20mg  twice a day and no side effects. She only has 10 a month now (from 20-25) and they are less severe. Now the headaches are left-sided, throbbing, some light sensitivity, no nausea. They last a few hours and are not severe. Can be a 6/10. Sound bother her too.   HPI:  Lauren Cardenas is a 30 y.o. female here as a referral from Dr. Dr. Despina Arias OD for headaches. Past medical history of kidney stones. Patient has headaches that started 3-4 months ago and she has headaches 20-25 days a month. Pressure around the head. She has ringing in the ears and blurry vision and has to refocus. The headaches last up to 2-3 hours multiple times a day. They can be 6/10 on average. No light or sound sensitivity. Tylenol may help a little. Hydrocodone may help. She has had multiple kidney stones and she has had surgery to remove them. No medication overuse headaches. No inciting events to the headaches, no head trauma no other inciting events. No other modifying factors or associated symptoms. No weight gain. No hx of Mirena IUD, Tetracycline or accutane. No previous illnesses to the headaches, head trauma, new medication. The headaches are worse as the day progresses. Bending ober cleaning makes it worse. No FHX of migraines.   Reviewed notes, labs and imaging from outside physicians, which showed:  Patient was seen at [facility].Dr. Despina Arias OD thus corrected visual acuities were 20/20 in the right eye and 20/20 in the left eye at distance and near. Extraocular movements  were full and unrestricted. Pupils were equally round and reactive to light with no APD. 15 mmHg in the right eye and left eye. Slit lamp findings were unremarkable. Dilated fundus examination showed mild optic nerve head edema bilaterally. No venous pulses noted in both eyes. No findings of optic nerve head pallor or optic nerve hemorrhages. 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 The macula was clear. Peripheral redness of both eyes were normal. Patient has mild optic nerve edema both eyes. Personally reviewed the Humphrey visual field data.  CT of the head 01/07/2002 (report only):  FINDINGS CLINICAL DATA: HIT BACK OF HEAD AND SYNCOPE. CT HEAD WITHOUT CONTRAST 01/04/02 AT 1530 HOURS COMPARISON NONE. FINDINGS: THE BRAIN PARENCHYMA, VENTRICULAR SYSTEM AND EXTRAAXIAL SPACE ARE WITHIN NORMAL LIMITS. THERE IS NO MASS EFFECT, MIDLINE SHIFT OR HEMORRHAGE. THERE IS NEAR TOTAL OPACIFICATION OF THE LEFT MAXILLARY SINUS WITH MUCUS MATERIAL AS WELL AS FLUID. A SMALL AMOUNT OF MUCOSAL THICKENING IS NOTED IN THE POSTERIOR ETHMOID SINUSES. THE MASTOID AIR CELLS ARE CLEAR. IMPRESSION 1. NEAR TOTAL OPACIFICATION OF THE LEFT MAXILLARY SINUS WITH FLUID. ACUTE SINUSITIS IS A CONSIDERATION. 2. OTHERWISE NO EVIDENCE OF ACUTE INTRACRANIAL PATHOLOGY.  Review of Systems: Patient complains of symptoms per HPI as well as the following symptoms: Fevers, fatigue, eye pain, feeling hot, feeling cold, snoring, joint pain, aching muscles, headache, sleepiness, snoring, decreased energy  Pertinent negatives per HPI. All others negative.   Social History   Social History  . Marital status: Married    Spouse name: [name]  . Number of children: 1  . Years of education: 18   Occupational History  . [employer]    Social History Main Topics  . Smoking status: Never  Smoker  . Smokeless tobacco: Never Used  . Alcohol use No  . Drug use:   . Sexual activity: Yes   Other Topics Concern  . Not on file   Social History Narrative   Lives with husband and daughter and in-laws   Caffeine use: 100-150mg /week    Family History  Problem Relation Age of Onset  . Hypertension Mother   . Diabetes Father   . Kidney disease Father     Past Medical History:  Diagnosis Date  . History of acute pyelonephritis    09-27-2015  . History of kidney stones   . Nephrolithiasis    bilateral  non-obstructive per ct 09-27-2015  . Right ureteral stone   . Urgency of urination     Past Surgical History:  Procedure Laterality Date  . CYSTOSCOPY W/ URETERAL STENT PLACEMENT Right 09/27/2015   Procedure: CYSTOSCOPY WITH RETROGRADE PYELOGRAM/URETERAL STENT PLACEMENT;  Surgeon: Hildred Laser, MD;  Location: [facility];  Service: Urology;  Laterality: Right;  . CYSTOSCOPY WITH RETROGRADE PYELOGRAM, URETEROSCOPY AND STENT PLACEMENT Left 09/09/2014   Procedure: CYSTOSCOPY WITH LEFT RETROGRADE PYELOGRAM, URETEROSCOPY AND STONE EXTRACTION  DOUBLE J STENT PLACEMENT;  Surgeon: Jerilee Field, MD;  Location: [facility];  Service: Urology;  Laterality: Left;  . CYSTOSCOPY/URETEROSCOPY/HOLMIUM LASER/STENT PLACEMENT Right 10/16/2015   Procedure: RIGHT URETEROSCOPY/HOLMIUM LASER/STENT PLACEMENT;  Surgeon: Jerilee Field, MD;  Location: [facility];  Service: Urology;  Laterality: Right;  . HOLMIUM LASER APPLICATION Left 09/09/2014   Procedure: HOLMIUM LASER APPLICATION;  Surgeon: Jerilee Field, MD;  Location: [facility];  Service: Urology;  Laterality: Left;  . HOLMIUM LASER APPLICATION Right 10/16/2015   Procedure: HOLMIUM LASER APPLICATION;  Surgeon: Jerilee Field, MD;  Location: [facility];  Service: Urology;  Laterality: Right;  . STONE EXTRACTION WITH BASKET Right 10/16/2015   Procedure: STONE EXTRACTION WITH BASKET;  Surgeon: Jerilee Field, MD;   Location: [facility];  Service: Urology;  Laterality: Right;  . WISDOM TOOTH EXTRACTION  03/2014    Current Outpatient Prescriptions  Medication Sig Dispense Refill  . acetaminophen (TYLENOL) 500 MG tablet Take 1,000 mg by mouth every 6 (six) hours as needed.    . cetirizine (ZYRTEC ALLERGY) 10 MG tablet Take 10 mg by mouth daily.    . cycloSPORINE (RESTASIS) 0.05 % ophthalmic emulsion Place 1 drop into both eyes 2 (two) times daily.    . fluticasone (FLONASE) 50 MCG/ACT nasal spray Place 1 spray into both nostrils as needed for allergies or rhinitis.    Marland Kitchen HYDROcodone-acetaminophen (NORCO) 7.5-325 MG tablet Take 1 tablet by mouth every 6 (six) hours as needed for moderate pain.    Marland Kitchen ibuprofen (ADVIL,MOTRIN) 200 MG tablet Take 400-800 mg by mouth 3 (three) times daily.     . Olopatadine HCl (PAZEO) 0.7 % SOLN Apply 1 drop to eye daily.    . promethazine (PHENERGAN) 25 MG tablet Take 25 mg by mouth as needed for nausea or vomiting.    . propranolol (INDERAL) 10 MG tablet Start with one tablet twice daily. In one week can increase to 2 tablets twice daily as needed if no  side effects. 120 tablet 8  . tamsulosin (FLOMAX) 0.4 MG CAPS capsule Take 0.4 mg by mouth as needed.     No current facility-administered medications for this visit.     Allergies as of 05/03/2016 - Review Complete 05/03/2016  Allergen Reaction Noted  . Amoxicillin Hives 06/12/2014    Vitals: BP 118/80 (BP Location: Right Arm, Patient Position: Sitting, Cuff Size: Normal)   Pulse 78   Ht 5\' 1"  (1.549 m)   Wt 177 lb (80.3 kg)   SpO2 98%   BMI 33.44 kg/m  Last Weight:  Wt Readings from Last 1 Encounters:  05/03/16 177 lb (80.3 kg)   Last Height:   Ht Readings from Last 1 Encounters:  05/03/16 5\' 1"  (1.549 m)       Physical exam: Exam: Gen: NAD, conversant, well nourised, obese, well groomed                     CV: RRR, no MRG. No Carotid Bruits. No peripheral edema, warm, nontender Eyes:  Conjunctivae clear without exudates or hemorrhage  Neuro: Detailed Neurologic Exam  Speech:    Speech is normal; fluent and spontaneous with normal comprehension.  Cognition:    The patient is oriented to person, place, and time;     recent and remote memory intact;     language fluent;     normal attention, concentration,     fund of knowledge Cranial Nerves:    The pupils are equal, round, and reactive to light. +1 optic disk edema. Visual fields are full to finger confrontation. Extraocular movements are intact. Trigeminal sensation is intact and the muscles of mastication are normal. The face is symmetric. The palate elevates in the midline. Hearing intact. Voice is normal. Shoulder shrug is normal. The tongue has normal motion without fasciculations.   Coordination:    Normal finger to nose and heel to shin.   Gait:   Normal native gait  Motor Observation:    No asymmetry, no atrophy, and no involuntary movements noted. Tone:    Normal muscle tone.    Posture:    Posture is normal. normal erect    Strength:    Strength is V/V in the upper and lower limbs.      Sensation: intact to LT     Reflex Exam:  DTR's:    Deep tendon reflexes in the upper and lower extremities are normal bilaterally.   Toes:    The toes are downgoing bilaterally.   Clonus:    Clonus is absent.      Assessment/Plan:  30 year old with optic nerve head edema, new onset headaches, vision and hearing changes. MRI of the brain was normal, opening pressure was 21 so was not started on Topamax or Diamox due to past medical history of kidney stones. The propranolol is helping, headaches cut in half. They also don't last as long. She is on 20mg  twice a day and no side effects. She only has 10 a month now (from 20-25) and they are less severe.  Increase propranolol.  Discussed: To prevent or relieve headaches, try the following:  Cool Compress. Lie down and place a cool compress on your  head.   Avoid headache triggers. If certain foods or odors seem to have triggered your migraines in the past, avoid them. A headache diary might help you identify triggers.   Include physical activity in your daily routine. Try a daily walk or other moderate aerobic exercise.  Manage stress. Find healthy ways to cope with the stressors, such as delegating tasks on your to-do list.   Practice relaxation techniques. Try deep breathing, yoga, massage and visualization.   Eat regularly. Eating regularly scheduled meals and maintaining a healthy diet might help prevent headaches. Also, drink plenty of fluids.   Follow a regular sleep schedule. Sleep deprivation might contribute to headaches  Consider biofeedback. With this mind-body technique, you learn to control certain bodily functions - such as muscle tension, heart rate and blood pressure - to prevent headaches or reduce headache pain.    Proceed to emergency room if you experience new or worsening symptoms or symptoms do not resolve, if you have new neurologic symptoms or if headache is severe, or for any concerning symptom.    Naomie DeanAntonia Rhoderick Farrel, MD  [facility] [address] [place], [place] [id number]  Phone [phone] Fax [phone]  A total of 30 minutes was spent face-to-face with this patient. Over half this time was spent on counseling patient on the migraine diagnosis and different diagnostic and therapeutic options available.

## 2016-05-03 NOTE — Patient Instructions (Signed)
Overall you are doing well but I do want to suggest a few things today:   Remember to drink plenty of fluid, eat healthy meals and do not skip any meals. Try to eat protein with a every meal and eat a healthy snack such as fruit or nuts in between meals. Try to keep a regular sleep-wake schedule and try to exercise daily, particularly in the form of walking, 20-30 minutes a day, if you can.   As far as your medications are concerned, I would like to suggest: Increase propranolol to 60mg  nightly Imitrex: Please take one tablet at the onset of your headache. If it does not improve the symptoms please take one additional tablet in 2 hours once. Do not take more then 2 tablets in 24hrs. Do not take use more then 2 to 3 days in a week.  I would like to see you back in 6 months, sooner if we need to. Please call us with any interim questions, concerns, problems, updates or refill requests.   Our phone number is [phone]. We also have an after hours call service for urgent matters and there is a physician on-call for urgent questions. For any emergencies you know to call 911 or go to the nearest emergency room  Sumatriptan tablets What is this medicine? SUMATRIPTAN (soo ma TRIP tan) is used to treat migraines with or without aura. An aura is a strange feeling or visual disturbance that warns you of an attack. It is not used to prevent migraines. This medicine may be used for other purposes; ask your health care provider or pharmacist if you have questions. What should I tell my health care provider before I take this medicine? They need to know if you have any of these conditions: -circulation problems in fingers and toes -diabetes -heart disease -high blood pressure -high cholesterol -history of irregular heartbeat -history of stroke -kidney disease -liver disease -postmenopausal or surgical removal of uterus and ovaries -seizures -smoke tobacco -stomach or intestine problems -an unusual  or allergic reaction to sumatriptan, other medicines, foods, dyes, or preservatives -pregnant or trying to get pregnant -breast-feeding How should I use this medicine? Take this medicine by mouth with a glass of water. Follow the directions on the prescription label. This medicine is taken at the first symptoms of a migraine. It is not for everyday use. If your migraine headache returns after one dose, you can take another dose as directed. You must leave at least 2 hours between doses, and do not take more than 100 mg as a single dose. Do not take more than 200 mg total in any 24 hour period. If there is no improvement at all after the first dose, do not take a second dose without talking to your doctor or health care professional. Do not take your medicine more often than directed. Talk to your pediatrician regarding the use of this medicine in children. Special care may be needed. Overdosage: If you think you have taken too much of this medicine contact a poison control center or emergency room at once. NOTE: This medicine is only for you. Do not share this medicine with others. What if I miss a dose? This does not apply; this medicine is not for regular use. What may interact with this medicine? Do not take this medicine with any of the following medicines: -cocaine -ergot alkaloids like dihydroergotamine, ergonovine, ergotamine, methylergonovine -feverfew -MAOIs like Carbex, Eldepryl, Marplan, Nardil, and Parnate -other medicines for migraine headache like almotriptan, eletriptan,  frovatriptan, naratriptan, rizatriptan, zolmitriptan -tryptophan This medicine may also interact with the following medications: -certain medicines for depression, anxiety, or psychotic disturbances This list may not describe all possible interactions. Give your health care provider a list of all the medicines, herbs, non-prescription drugs, or dietary supplements you use. Also tell them if you smoke, drink  alcohol, or use illegal drugs. Some items may interact with your medicine. What should I watch for while using this medicine? Only take this medicine for a migraine headache. Take it if you get warning symptoms or at the start of a migraine attack. It is not for regular use to prevent migraine attacks. You may get drowsy or dizzy. Do not drive, use machinery, or do anything that needs mental alertness until you know how this medicine affects you. To reduce dizzy or fainting spells, do not sit or stand up quickly, especially if you are an older patient. Alcohol can increase drowsiness, dizziness and flushing. Avoid alcoholic drinks. Smoking cigarettes may increase the risk of heart-related side effects from using this medicine. If you take migraine medicines for 10 or more days a month, your migraines may get worse. Keep a diary of headache days and medicine use. Contact your healthcare professional if your migraine attacks occur more frequently. What side effects may I notice from receiving this medicine? Side effects that you should report to your doctor or health care professional as soon as possible: -allergic reactions like skin rash, itching or hives, swelling of the face, lips, or tongue -bloody or watery diarrhea -hallucination, loss of contact with reality -pain, tingling, numbness in the face, hands, or feet -seizures -signs and symptoms of a blood clot such as breathing problems; changes in vision; chest pain; severe, sudden headache; pain, swelling, warmth in the leg; trouble speaking; sudden numbness or weakness of the face, arm, or leg -signs and symptoms of a dangerous change in heartbeat or heart rhythm like chest pain; dizziness; fast or irregular heartbeat; palpitations, feeling faint or lightheaded; falls; breathing problems -signs and symptoms of a stroke like changes in vision; confusion; trouble speaking or understanding; severe headaches; sudden numbness or weakness of the face,  arm, or leg; trouble walking; dizziness; loss of balance or coordination -stomach pain Side effects that usually do not require medical attention (report these to your doctor or health care professional if they continue or are bothersome): -changes in taste -facial flushing -headache -muscle cramps -muscle pain -nausea, vomiting -weak or tired This list may not describe all possible side effects. Call your doctor for medical advice about side effects. You may report side effects to FDA at 1-800-FDA-1088. Where should I keep my medicine? Keep out of the reach of children. Store at room temperature between 2 and 30 degrees C (36 and 86 degrees F). Throw away any unused medicine after the expiration date. NOTE: This sheet is a summary. It may not cover all possible information. If you have questions about this medicine, talk to your doctor, pharmacist, or health care provider.    2016, Elsevier/Gold Standard. (2014-12-18 17:46:40)

## 2016-07-21 ENCOUNTER — Telehealth: Payer: Self-pay | Admitting: Neurology

## 2016-07-21 MED ORDER — RIZATRIPTAN BENZOATE 10 MG PO TBDP
10.0000 mg | ORAL_TABLET | Freq: Three times a day (TID) | ORAL | 3 refills | Status: DC | PRN
Start: 2016-07-21 — End: 2017-05-09

## 2016-07-21 NOTE — Telephone Encounter (Signed)
Pt called says she cannot take SUMAtriptan (IMITREX) 100 MG tablet(Expired) . It makes her "loopy". She is aware Dr A is out of the office until Monday but she would like this addressed before then.

## 2016-07-21 NOTE — Telephone Encounter (Signed)
Dr Anne HahnWillis- please advise. [facility] Is there something else you can give to patient to try? Dr Lucia GaskinsAhern out of the office until Monday

## 2016-07-21 NOTE — Addendum Note (Signed)
Addended by: Stephanie AcreWILLIS, Daniela Hernan on: 07/21/2016 07:50 PM   Modules accepted: Orders

## 2016-07-21 NOTE — Telephone Encounter (Signed)
I called patient. The Imitrex made her feel unusual, she was talking in her sleep after taking the medication. She would like to take another type of drug for the headache.  I will call in a prescription for Maxalt.

## 2016-08-09 ENCOUNTER — Ambulatory Visit (INDEPENDENT_AMBULATORY_CARE_PROVIDER_SITE_OTHER): Payer: Medicaid Other | Admitting: Physician Assistant

## 2016-08-09 ENCOUNTER — Encounter: Payer: Self-pay | Admitting: Physician Assistant

## 2016-08-09 VITALS — BP 129/84 | HR 82 | Temp 100.6°F | Ht 61.0 in | Wt 182.0 lb

## 2016-08-09 DIAGNOSIS — R059 Cough, unspecified: Secondary | ICD-10-CM

## 2016-08-09 DIAGNOSIS — J101 Influenza due to other identified influenza virus with other respiratory manifestations: Secondary | ICD-10-CM

## 2016-08-09 DIAGNOSIS — R52 Pain, unspecified: Secondary | ICD-10-CM

## 2016-08-09 DIAGNOSIS — J40 Bronchitis, not specified as acute or chronic: Secondary | ICD-10-CM

## 2016-08-09 DIAGNOSIS — R509 Fever, unspecified: Secondary | ICD-10-CM | POA: Diagnosis not present

## 2016-08-09 DIAGNOSIS — R05 Cough: Secondary | ICD-10-CM | POA: Diagnosis not present

## 2016-08-09 LAB — VERITOR FLU A/B WAIVED
INFLUENZA B: NEGATIVE
Influenza A: POSITIVE — AB

## 2016-08-09 MED ORDER — OSELTAMIVIR PHOSPHATE 75 MG PO CAPS: 75.0000 mg | ORAL_CAPSULE | Freq: Two times a day (BID) | ORAL | 0 refills | Status: DC

## 2016-08-09 MED ORDER — AZITHROMYCIN 250 MG PO TABS
ORAL_TABLET | ORAL | 0 refills | Status: DC
Start: 2016-08-09 — End: 2017-02-14

## 2016-08-09 MED ORDER — HYDROCODONE-HOMATROPINE 5-1.5 MG/5ML PO SYRP: 5.0000 mL | ORAL_SOLUTION | Freq: Four times a day (QID) | ORAL | 0 refills | Status: DC | PRN

## 2016-08-09 NOTE — Patient Instructions (Signed)

## 2016-08-11 NOTE — Progress Notes (Signed)
BP 129/84   Pulse 82   Temp (!) 100.6 F (38.1 C) (Oral)   Ht 5\' 1"  (1.549 m)   Wt 182 lb (82.6 kg)   BMI 34.39 kg/m    Subjective:    Patient ID: Lauren Cardenas, female    DOB: Dec 29, 1985, 31 y.o.   MRN: [id number]  HPI: Lauren Cardenas is a 31 y.o. female presenting on 08/09/2016 for Fever (101.5- yesterday evening); Generalized Body Aches (x 3 days); Cough; and Nasal Congestion  This patient has had less than 2 days severe fever, chills, myalgias.  Complains of sinus headache and postnasal drainage. There is copious drainage at times. Associated sore throat, decreased appetite and headache.  Has been exposed to influenza.    Relevant past medical, surgical, family and social history reviewed and updated as indicated. Allergies and medications reviewed and updated.  Past Medical History:  Diagnosis Date  . History of acute pyelonephritis    09-27-2015  . History of kidney stones   . Nephrolithiasis    bilateral  non-obstructive per ct 09-27-2015  . Right ureteral stone   . Urgency of urination     Past Surgical History:  Procedure Laterality Date  . CYSTOSCOPY W/ URETERAL STENT PLACEMENT Right 09/27/2015   Procedure: CYSTOSCOPY WITH RETROGRADE PYELOGRAM/URETERAL STENT PLACEMENT;  Surgeon: Hildred Laser, MD;  Location: [facility];  Service: Urology;  Laterality: Right;  . CYSTOSCOPY WITH RETROGRADE PYELOGRAM, URETEROSCOPY AND STENT PLACEMENT Left 09/09/2014   Procedure: CYSTOSCOPY WITH LEFT RETROGRADE PYELOGRAM, URETEROSCOPY AND STONE EXTRACTION  DOUBLE J STENT PLACEMENT;  Surgeon: Jerilee Field, MD;  Location: [facility];  Service: Urology;  Laterality: Left;  . CYSTOSCOPY/URETEROSCOPY/HOLMIUM LASER/STENT PLACEMENT Right 10/16/2015   Procedure: RIGHT URETEROSCOPY/HOLMIUM LASER/STENT PLACEMENT;  Surgeon: Jerilee Field, MD;  Location: [facility];  Service: Urology;  Laterality: Right;  . HOLMIUM LASER APPLICATION Left 09/09/2014   Procedure: HOLMIUM LASER  APPLICATION;  Surgeon: Jerilee Field, MD;  Location: [facility];  Service: Urology;  Laterality: Left;  . HOLMIUM LASER APPLICATION Right 10/16/2015   Procedure: HOLMIUM LASER APPLICATION;  Surgeon: Jerilee Field, MD;  Location: [facility];  Service: Urology;  Laterality: Right;  . STONE EXTRACTION WITH BASKET Right 10/16/2015   Procedure: STONE EXTRACTION WITH BASKET;  Surgeon: Jerilee Field, MD;  Location: [facility];  Service: Urology;  Laterality: Right;  . WISDOM TOOTH EXTRACTION  03/2014    Review of Systems  Constitutional: Positive for appetite change, chills, fatigue and fever. Negative for activity change.  HENT: Positive for congestion, postnasal drip and sore throat.   Eyes: Negative.   Respiratory: Negative for cough and wheezing.   Cardiovascular: Negative.  Negative for chest pain, palpitations and leg swelling.  Gastrointestinal: Negative.   Genitourinary: Negative.   Musculoskeletal: Positive for myalgias.  Skin: Negative.   Neurological: Positive for headaches.    Allergies as of 08/09/2016      Reactions   Amoxicillin Hives      Medication List       Accurate as of 08/09/16 11:59 PM. Always use your most recent med list.          acetaminophen 500 MG tablet Commonly known as:  TYLENOL Take 1,000 mg by mouth every 6 (six) hours as needed.   azithromycin 250 MG tablet Commonly known as:  ZITHROMAX Z-PAK Take as directed   cycloSPORINE 0.05 % ophthalmic emulsion Commonly known as:  RESTASIS Place 1 drop into both eyes 2 (two) times daily.  fluticasone 50 MCG/ACT nasal spray Commonly known as:  FLONASE Place 1 spray into both nostrils as needed for allergies or rhinitis.   HYDROcodone-acetaminophen 7.5-325 MG tablet Commonly known as:  NORCO Take 1 tablet by mouth every 6 (six) hours as needed for moderate pain.   HYDROcodone-homatropine 5-1.5 MG/5ML syrup Commonly known as:  HYCODAN Take 5 mLs by mouth every 6  (six) hours as needed for cough.   ibuprofen 200 MG tablet Commonly known as:  ADVIL,MOTRIN Take 400-800 mg by mouth 3 (three) times daily.   oseltamivir 75 MG capsule Commonly known as:  TAMIFLU Take 1 capsule (75 mg total) by mouth 2 (two) times daily.   PAZEO 0.7 % Soln Generic drug:  Olopatadine HCl Apply 1 drop to eye daily.   promethazine 25 MG tablet Commonly known as:  PHENERGAN Take 25 mg by mouth as needed for nausea or vomiting.   propranolol ER 60 MG 24 hr capsule Commonly known as:  INDERAL LA Take 1 capsule (60 mg total) by mouth at bedtime.   rizatriptan 10 MG disintegrating tablet Commonly known as:  MAXALT-MLT Take 1 tablet (10 mg total) by mouth 3 (three) times daily as needed for migraine.   tamsulosin 0.4 MG Caps capsule Commonly known as:  FLOMAX Take 0.4 mg by mouth as needed.   ZYRTEC ALLERGY 10 MG tablet Generic drug:  cetirizine Take 10 mg by mouth daily.          Objective:    BP 129/84   Pulse 82   Temp (!) 100.6 F (38.1 C) (Oral)   Ht 5\' 1"  (1.549 m)   Wt 182 lb (82.6 kg)   BMI 34.39 kg/m   Allergies  Allergen Reactions  . Amoxicillin Hives    Physical Exam  Constitutional: She is oriented to person, place, and time. She appears well-developed and well-nourished. No distress.  HENT:  Head: Normocephalic and atraumatic.  Right Ear: Tympanic membrane normal.  Left Ear: Tympanic membrane normal.  Nose: Mucosal edema and rhinorrhea present. Right sinus exhibits no frontal sinus tenderness. Left sinus exhibits no frontal sinus tenderness.  Mouth/Throat: Posterior oropharyngeal erythema present. No oropharyngeal exudate or tonsillar abscesses.  Eyes: Conjunctivae and EOM are normal. Pupils are equal, round, and reactive to light.  Neck: Normal range of motion.  Cardiovascular: Normal rate, regular rhythm, normal heart sounds, intact distal pulses and normal pulses.   Pulmonary/Chest: Effort normal and breath sounds normal. No  respiratory distress.  Abdominal: Soft. Bowel sounds are normal.  Neurological: She is alert and oriented to person, place, and time. She has normal reflexes.  Skin: Skin is warm and dry. No rash noted.  Psychiatric: She has a normal mood and affect. Her behavior is normal. Judgment and thought content normal.  Nursing note and vitals reviewed.   Results for orders placed or performed in visit on 08/09/16  Veritor Flu A/B Waived  Result Value Ref Range   Influenza A Positive (A) Negative   Influenza B Negative Negative      Assessment & Plan:   1. Body aches - Veritor Flu A/B Waived - oseltamivir (TAMIFLU) 75 MG capsule; Take 1 capsule (75 mg total) by mouth 2 (two) times daily.  Dispense: 10 capsule; Refill: 0  2. Fever, unspecified fever cause - Veritor Flu A/B Waived - oseltamivir (TAMIFLU) 75 MG capsule; Take 1 capsule (75 mg total) by mouth 2 (two) times daily.  Dispense: 10 capsule; Refill: 0  3. Cough - Veritor Flu A/B Waived -  HYDROcodone-homatropine (HYCODAN) 5-1.5 MG/5ML syrup; Take 5 mLs by mouth every 6 (six) hours as needed for cough.  Dispense: 120 mL; Refill: 0  4. Influenza A - oseltamivir (TAMIFLU) 75 MG capsule; Take 1 capsule (75 mg total) by mouth 2 (two) times daily.  Dispense: 10 capsule; Refill: 0  5. Bronchitis - azithromycin (ZITHROMAX Z-PAK) 250 MG tablet; Take as directed  Dispense: 6 each; Refill: 0   Continue all other maintenance medications as listed above.  Follow up plan: Return if symptoms worsen or fail to improve.  Educational handout given for influenza  Remus LofflerAngel S. Keri Tavella PA-C [facility] [address]  [place], [place] [address] [phone]   08/11/2016, 8:59 PM

## 2016-11-01 ENCOUNTER — Ambulatory Visit (INDEPENDENT_AMBULATORY_CARE_PROVIDER_SITE_OTHER): Payer: Medicaid Other | Admitting: Neurology

## 2016-11-01 ENCOUNTER — Encounter: Payer: Self-pay | Admitting: Neurology

## 2016-11-01 VITALS — BP 116/81 | HR 64 | Ht 61.0 in | Wt 182.8 lb

## 2016-11-01 DIAGNOSIS — M542 Cervicalgia: Secondary | ICD-10-CM | POA: Diagnosis not present

## 2016-11-01 DIAGNOSIS — G43709 Chronic migraine without aura, not intractable, without status migrainosus: Secondary | ICD-10-CM

## 2016-11-01 DIAGNOSIS — G8929 Other chronic pain: Secondary | ICD-10-CM | POA: Diagnosis not present

## 2016-11-01 MED ORDER — PROPRANOLOL HCL ER 80 MG PO CP24: 80.0000 mg | ORAL_CAPSULE | Freq: Every day | ORAL | 11 refills | Status: DC

## 2016-11-01 NOTE — Progress Notes (Addendum)
[facility]    Provider:  Dr Lucia Gaskins Referring Provider: Bennie Pierini, * Primary Care Physician:  Bennie Pierini, FNP   CC: Migraines  Interval history 11/01/2016: Patient is here for follow-up of migraines. She has 15 headache days a month. 8 are migrainous. Discussed options, increasing meds, botox for migraines.  Medications tried: propranolol,Topiramate (contraindicated due to kidney stones).   Interval history 05/03/2016: MRI of the brain was normal, opening pressure was 21 so was not started on Topamax or Diamox due to past medical history of kidney stones. The propranolol is helping, headaches cut in half. They also don't last as long. She is on 20mg  twice a day and no side effects. She only has 10 a month now (from 20-25) and they are less severe. Now the headaches are left-sided, throbbing, some light sensitivity, no nausea. They last a few hours and are not severe. Can be a 6/10. Sound bother her too.   ZOX:WRUEAVW Lauren Breweris a 31 y.o.femalehere as a referral from Dr. Dr. Despina Arias ODfor headaches. Past medical history of kidney stones. Patient has headaches that started 3-4 months ago and she has headaches 20-25 days a month. Pressure around the head. She has ringing in the ears and blurry vision and has to refocus. The headaches last up to 2-3 hours multiple times a day. They can be 6/10 on average. No light or sound sensitivity. Tylenol may help a little. Hydrocodone may help. She has had multiple kidney stones and she has had surgery to remove them. No medication overuse headaches. No inciting events to the headaches, no head trauma no other inciting events. No other modifying factors or associated symptoms. No weight gain. No hx of Mirena IUD, Tetracycline or accutane. No previous illnesses to the headaches, head trauma, new medication. The headaches are worse as the day progresses. Bending ober cleaning makes it worse. No FHX of migraines.    Reviewed notes, labs and imaging from outside physicians, which showed:  Patient was seen at [facility]Dr. Despina Arias OD thus corrected visual acuities were 20/20 in the right eye and 20/20 in the left eye at distance and near. Extraocular movements were full and unrestricted. Pupils were equally round and reactive to light with no APD. 15 mmHg in the right eye and left eye. Slit lamp findings were unremarkable. Dilated fundus examination showed mild optic nerve head edema bilaterally. No venous pulses noted in both eyes. No findings of optic nerve head pallor or optic nerve hemorrhages. 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 The macula was clear. Peripheral redness of both eyes were normal. Patient has mild optic nerve edema both eyes. Personally reviewed the Humphrey visual field data.  CT of the head 01/07/2002 (report only):  FINDINGS CLINICAL DATA: HIT BACK OF HEAD AND SYNCOPE. CT HEAD WITHOUT CONTRAST 01/04/02 AT 1530 HOURS COMPARISON NONE. FINDINGS: THE BRAIN PARENCHYMA, VENTRICULAR SYSTEM AND EXTRAAXIAL SPACE ARE WITHIN NORMAL LIMITS. THERE IS NO MASS EFFECT, MIDLINE SHIFT OR HEMORRHAGE. THERE IS NEAR TOTAL OPACIFICATION OF THE LEFT MAXILLARY SINUS WITH MUCUS MATERIAL AS WELL AS FLUID. A SMALL AMOUNT OF MUCOSAL THICKENING IS NOTED IN THE POSTERIOR ETHMOID SINUSES. THE MASTOID AIR CELLS ARE CLEAR. IMPRESSION 1. NEAR TOTAL OPACIFICATION OF THE LEFT MAXILLARY SINUS WITH FLUID. ACUTE SINUSITIS IS A CONSIDERATION. 2. OTHERWISE NO EVIDENCE OF ACUTE INTRACRANIAL PATHOLOGY.  Review of Systems: Patient complains  of symptoms per HPI as well as the following symptoms: Fevers, fatigue, eye pain, feeling hot, feeling cold, snoring, joint pain, aching muscles, headache, sleepiness, snoring, decreased energy, eye icthing, back pain, neck pain, neck  stiffnessPertinent negatives per HPI. All others negative.   Social History   Social History  . Marital status: Married    Spouse name: [name]  . Number of children: 1  . Years of education: 49   Occupational History  . [employer]    Social History Main Topics  . Smoking status: Never Smoker  . Smokeless tobacco: Never Used  . Alcohol use No  . Drug use: Yes  . Sexual activity: Yes   Other Topics Concern  . Not on file   Social History Narrative   Lives with husband and daughter and in-laws   Caffeine use: 100-150mg /week    Family History  Problem Relation Age of Onset  . Hypertension Mother   . Diabetes Father   . Kidney disease Father     Past Medical History:  Diagnosis Date  . History of acute pyelonephritis    09-27-2015  . History of kidney stones   . Nephrolithiasis    bilateral  non-obstructive per ct 09-27-2015  . Right ureteral stone   . Urgency of urination     Past Surgical History:  Procedure Laterality Date  . CYSTOSCOPY W/ URETERAL STENT PLACEMENT Right 09/27/2015   Procedure: CYSTOSCOPY WITH RETROGRADE PYELOGRAM/URETERAL STENT PLACEMENT;  Surgeon: Hildred Laser, MD;  Location: [facility];  Service: Urology;  Laterality: Right;  . CYSTOSCOPY WITH RETROGRADE PYELOGRAM, URETEROSCOPY AND STENT PLACEMENT Left 09/09/2014   Procedure: CYSTOSCOPY WITH LEFT RETROGRADE PYELOGRAM, URETEROSCOPY AND STONE EXTRACTION  DOUBLE J STENT PLACEMENT;  Surgeon: Jerilee Field, MD;  Location: [facility];  Service: Urology;  Laterality: Left;  . CYSTOSCOPY/URETEROSCOPY/HOLMIUM LASER/STENT PLACEMENT Right 10/16/2015   Procedure: RIGHT URETEROSCOPY/HOLMIUM LASER/STENT PLACEMENT;  Surgeon: Jerilee Field, MD;  Location: [facility];  Service: Urology;  Laterality: Right;  . HOLMIUM LASER APPLICATION Left 09/09/2014   Procedure: HOLMIUM LASER APPLICATION;  Surgeon: Jerilee Field, MD;  Location: [facility];  Service: Urology;  Laterality: Left;  .  HOLMIUM LASER APPLICATION Right 10/16/2015   Procedure: HOLMIUM LASER APPLICATION;  Surgeon: Jerilee Field, MD;  Location: [facility];  Service: Urology;  Laterality: Right;  . STONE EXTRACTION WITH BASKET Right 10/16/2015   Procedure: STONE EXTRACTION WITH BASKET;  Surgeon: Jerilee Field, MD;  Location: [facility];  Service: Urology;  Laterality: Right;  . WISDOM TOOTH EXTRACTION  03/2014    Current Outpatient Prescriptions  Medication Sig Dispense Refill  . acetaminophen (TYLENOL) 500 MG tablet Take 1,000 mg by mouth every 6 (six) hours as needed.    Marland Kitchen azithromycin (ZITHROMAX Z-PAK) 250 MG tablet Take as directed 6 each 0  . cetirizine (ZYRTEC ALLERGY) 10 MG tablet Take 10 mg by mouth daily.    Marland Kitchen CRANBERRY PO Take 500 mg by mouth daily.    . cycloSPORINE (RESTASIS) 0.05 % ophthalmic emulsion Place 1 drop into both eyes 2 (two) times daily.    . fluticasone (FLONASE) 50 MCG/ACT nasal spray Place 1 spray into both nostrils as needed for allergies or rhinitis.    Marland Kitchen HYDROcodone-acetaminophen (NORCO) 7.5-325 MG tablet Take 1 tablet by mouth every 6 (six) hours as needed for moderate pain.    Marland Kitchen ibuprofen (ADVIL,MOTRIN) 200 MG tablet Take 400-800 mg by mouth 3 (three) times daily.     Marland Kitchen  Olopatadine HCl (PAZEO) 0.7 % SOLN Apply 1 drop to eye daily.    . promethazine (PHENERGAN) 25 MG tablet Take 25 mg by mouth as needed for nausea or vomiting.    . propranolol ER (INDERAL LA) 60 MG 24 hr capsule Take 1 capsule (60 mg total) by mouth at bedtime. 90 capsule 6  . rizatriptan (MAXALT-MLT) 10 MG disintegrating tablet Take 1 tablet (10 mg total) by mouth 3 (three) times daily as needed for migraine. 9 tablet 3  . tamsulosin (FLOMAX) 0.4 MG CAPS capsule Take 0.4 mg by mouth as needed.     No current facility-administered medications for this visit.     Allergies as of 11/01/2016 - Review Complete 11/01/2016  Allergen Reaction Noted  . Amoxicillin Hives 06/12/2014     Vitals: BP 116/81   Pulse 64   Ht 5\' 1"  (1.549 m)   Wt 182 lb 12.8 oz (82.9 kg)   BMI 34.54 kg/m  Last Weight:  Wt Readings from Last 1 Encounters:  11/01/16 182 lb 12.8 oz (82.9 kg)   Last Height:   Ht Readings from Last 1 Encounters:  11/01/16 5\' 1"  (1.549 m)   Physical exam: Exam: Gen: NAD, conversant, well nourised, obese, well groomed                     CV: RRR, no MRG. No Carotid Bruits. No peripheral edema, warm, nontender Eyes: Conjunctivae clear without exudates or hemorrhage  Neuro: Detailed Neurologic Exam  Speech:    Speech is normal; fluent and spontaneous with normal comprehension.  Cognition:    The patient is oriented to person, place, and time;     recent and remote memory intact;     language fluent;     normal attention, concentration,     fund of knowledge Cranial Nerves:    The pupils are equal, round, and reactive to light. The fundi are normal and spontaneous venous pulsations are present. Visual fields are full to finger confrontation. Extraocular movements are intact. Trigeminal sensation is intact and the muscles of mastication are normal. The face is symmetric. The palate elevates in the midline. Hearing intact. Voice is normal. Shoulder shrug is normal. The tongue has normal motion without fasciculations.   Coordination:    Normal finger to nose and heel to shin. Normal rapid alternating movements.   Gait:    Heel-toe and tandem gait are normal.   Motor Observation:    No asymmetry, no atrophy, and no involuntary movements noted. Tone:    Normal muscle tone.    Posture:    Posture is normal. normal erect    Strength:    Strength is V/V in the upper and lower limbs.      Sensation: intact to LT     Reflex Exam:  DTR's:    Deep tendon reflexes in the upper and lower extremities are normal bilaterally.   Toes:    The toes are downgoing bilaterally.   Clonus:    Clonus is absent.     Assessment/Plan:31 year old with  optic nerve head edema, new onset headaches, vision and hearing changes. MRI of the brain was normal, opening pressure was 21 so was not started on Topamax or Diamox due to past medical history of kidney stones. The propranolol is helping, headaches cut in half. They also don't last as long. Increase propranolol to 80mg  at night.  - Recommend massage - Physical therapy for cervicalgia, musculoskeletal neck pain, chronic neck pain  and worsening migraines due to neck pain  Discussed: To prevent or relieve headaches, try the following: Cool Compress. Lie down and place a cool compress on your head.  Avoid headache triggers. If certain foods or odors seem to have triggered your migraines in the past, avoid them. A headache diary might help you identify triggers.  Include physical activity in your daily routine. Try a daily walk or other moderate aerobic exercise.  Manage stress. Find healthy ways to cope with the stressors, such as delegating tasks on your to-do list.  Practice relaxation techniques. Try deep breathing, yoga, massage and visualization.  Eat regularly. Eating regularly scheduled meals and maintaining a healthy diet might help prevent headaches. Also, drink plenty of fluids.  Follow a regular sleep schedule. Sleep deprivation might contribute to headaches Consider biofeedback. With this mind-body technique, you learn to control certain bodily functions - such as muscle tension, heart rate and blood pressure - to prevent headaches or reduce headache pain.    Proceed to emergency room if you experience new or worsening symptoms or symptoms do not resolve, if you have new neurologic symptoms or if headache is severe, or for any concerning symptom.    Naomie DeanAntonia Ahern, MD  [facility] [address] [place], [place] [address]  Phone [phone] Fax [phone]  A total of 25 minutes was spent face-to-face with this patient. Over half this time was  spent on counseling patient on the migraine, musculoskeletal necl pain, cervicalgia diagnosis and different diagnostic and therapeutic options available.

## 2016-11-01 NOTE — Patient Instructions (Addendum)
Remember to drink plenty of fluid, eat healthy meals and do not skip any meals. Try to eat protein with a every meal and eat a healthy snack such as fruit or nuts in between meals. Try to keep a regular sleep-wake schedule and try to exercise daily, particularly in the form of walking, 20-30 minutes a day, if you can.   As far as your medications are concerned, I would like to suggest: Increase propranolol 80mg  at night  As far as diagnostic testing: Healthy weight and wellness [phone]  I would like to see you back in 6 months to a year, sooner if we need to. Please call us with any interim questions, concerns, problems, updates or refill requests.   Our phone number is [phone]. We also have an after hours call service for urgent matters and there is a physician on-call for urgent questions. For any emergencies you know to call 911 or go to the nearest emergency room

## 2016-11-18 ENCOUNTER — Ambulatory Visit: Payer: Medicaid Other | Attending: Neurology | Admitting: Rehabilitation

## 2016-11-18 ENCOUNTER — Encounter: Payer: Self-pay | Admitting: Rehabilitation

## 2016-11-18 DIAGNOSIS — R293 Abnormal posture: Secondary | ICD-10-CM | POA: Diagnosis present

## 2016-11-18 DIAGNOSIS — M542 Cervicalgia: Secondary | ICD-10-CM | POA: Diagnosis present

## 2016-11-18 NOTE — Therapy (Signed)
[facility] [address] [place], [place], [address] Phone: [phone]   Fax:  [phone]  Physical Therapy Evaluation  Patient Details  Name: Lauren CurlingRachael D Arrambide MRN: [id number] Date of Birth: Apr 11, 1986 Referring Provider: Naomie DeanAntonia Ahern, MD  Encounter Date: 11/18/2016      PT End of Session - 11/18/16 0927    Visit Number 1   Number of Visits 1   Authorization Type MCD one time eval only    PT Start Time 0800   PT Stop Time 0910   PT Time Calculation (min) 70 min   Activity Tolerance Patient tolerated treatment well   Behavior During Therapy [facility] for tasks assessed/performed      Past Medical History:  Diagnosis Date  . History of acute pyelonephritis    09-27-2015  . History of kidney stones   . Nephrolithiasis    bilateral  non-obstructive per ct 09-27-2015  . Right ureteral stone   . Urgency of urination     Past Surgical History:  Procedure Laterality Date  . CYSTOSCOPY W/ URETERAL STENT PLACEMENT Right 09/27/2015   Procedure: CYSTOSCOPY WITH RETROGRADE PYELOGRAM/URETERAL STENT PLACEMENT;  Surgeon: Hildred LaserBrian James Budzyn, MD;  Location: [facility];  Service: Urology;  Laterality: Right;  . CYSTOSCOPY WITH RETROGRADE PYELOGRAM, URETEROSCOPY AND STENT PLACEMENT Left 09/09/2014   Procedure: CYSTOSCOPY WITH LEFT RETROGRADE PYELOGRAM, URETEROSCOPY AND STONE EXTRACTION  DOUBLE J STENT PLACEMENT;  Surgeon: Jerilee FieldMatthew Eskridge, MD;  Location: [facility];  Service: Urology;  Laterality: Left;  . CYSTOSCOPY/URETEROSCOPY/HOLMIUM LASER/STENT PLACEMENT Right 10/16/2015   Procedure: RIGHT URETEROSCOPY/HOLMIUM LASER/STENT PLACEMENT;  Surgeon: Jerilee FieldMatthew Eskridge, MD;  [facility] [place];  Service: Urology;  Laterality: Right;  . HOLMIUM LASER APPLICATION Left 09/09/2014   Procedure: HOLMIUM LASER APPLICATION;  Surgeon: Jerilee FieldMatthew Eskridge, MD;  Location: [facility];  Service: Urology;  Laterality: Left;  . HOLMIUM LASER  APPLICATION Right 10/16/2015   Procedure: HOLMIUM LASER APPLICATION;  Surgeon: Jerilee FieldMatthew Eskridge, MD;  [facility] [place];  Service: Urology;  Laterality: Right;  . STONE EXTRACTION WITH BASKET Right 10/16/2015   Procedure: STONE EXTRACTION WITH BASKET;  Surgeon: Jerilee FieldMatthew Eskridge, MD;  [facility] [place];  Service: Urology;  Laterality: Right;  . WISDOM TOOTH EXTRACTION  03/2014    There were no vitals filed for this visit.       Subjective Assessment - 11/18/16 0804    Subjective Reports having less migraines and somewhat less intensity, but not always.  She does not feel that she is on the right dosage of pain medication but they are going up slowly.  Reports soreness at upper trap region that mainly stays there but sometimes does go into neck.  When she gets migraines, they can be in "ram horn" shape or band like or a specific place.  Once neck pain/migraine comes on, it can depend on how long it takes to subside but can take up to an hour.     Pertinent History hx of migraines since feb/march 2017, has autistic daughter that is 514 y/o    Limitations House hold activities   Patient Stated Goals "to decrease my migraines"    Currently in Pain? Yes   Pain Score 4    Pain Location Back   Pain Orientation Right;Left;Upper   Pain Descriptors / Indicators Sore   Pain Type Chronic pain   Pain Onset More than a month ago   Pain Frequency Intermittent   Aggravating Factors  nothing   Pain Relieving  Factors pain medication             [facility] PT Assessment - 11/18/16 0001      Assessment   Medical Diagnosis migraines   Referring Provider Naomie Dean, MD   Onset Date/Surgical Date --  Since Feb/March 2017     Home Environment   Living Environment Private residence   Living Arrangements Spouse/significant other   Available Help at Discharge Family;Available PRN/intermittently   Type of Home House     Prior Function   Level of Independence  Independent     Sensation   Light Touch Appears Intact     Coordination   Gross Motor Movements are Fluid and Coordinated Yes   Fine Motor Movements are Fluid and Coordinated Yes     Posture/Postural Control   Posture/Postural Control Postural limitations   Posture Comments Pt with decrease curvature of the cervical and thoracic spine.  Note increased muscle tone at B upper traps     ROM / Strength   AROM / PROM / Strength AROM     AROM   Overall AROM  Deficits   AROM Assessment Site Cervical   Cervical Flexion 25   Cervical Extension 60   Cervical - Right Rotation 35  following PAs and lateral glide mobs improved to 47 on R   Cervical - Left Rotation 40  Following lateral glide and PA mobs, improved to 46 deg     Palpation   Palpation comment ROM with overpressure no increase in symptoms with flex/ext, however note with rotation she reports pull in scalene/upper trap region bilaterally      Special Tests    Special Tests Cervical   Cervical Tests other;other2     other    Findings Negative   Comment Both Alar and Transverse LIgament tests negative     other    Findings Negative   Comment Sharps Purser test                    [facility] Adult PT Treatment/Exercise - 11/18/16 0001      Exercises   Exercises Other Exercises   Other Exercises  Performed prone chin tucks with head off mat x 10 reps, added rotation to this exercise x 5 reps (see HEP), went over doorway traction with retraction and provided green theraband during session.  Also went over SNAGs to promote traction with cervical flexion and then rotation R and L and had pt perform x 5 reps on each side for improved carryover (see HEP).  Also demonstrated how to perform deep cervical flexion while rolling on physioball to encourage improved posture and scapular retraction as well as core activation.  Pt tolerated all very well.       Manual Therapy   Manual Therapy Joint mobilization;Manual Traction    Manual therapy comments PA joint mobilizations to entire C spine as well as SL lateral glide mobilizations (grade II and III) to entire C spine.  Tolerated well   Joint Mobilization see above for details.   Note decreased joint mobility in C4-7, therefore performed more reps of joint mobilization to this area but was finally able to perform grade III mobilization to these areas in both PA direction and SL to increase flexion and rotation.    Manual Traction Performed manual traction with retraction during session on table in grade II mobilization progressing to having pts head off table to provide more retraction with extension x 5 reps.  Tolerated well.  PT Education - 11/18/16 0926    Education provided Yes   Education Details Education on joint mobilizations and HEP to carryover for reduced headaches and neck pain.  Educated that we are limited to eval due to MCD   Person(s) Educated Patient   Methods Explanation;Demonstration;Handout   Comprehension Returned demonstration;Verbalized understanding                    Plan - 11/18/16 0927    Clinical Impression Statement Pt presents with history of migraines since Feb/March of 2017 with neck pain that accompanies these headaches.  She has seen neurologist and has gotten medication, however this seems to only dull migraine sometimes and note that it makes her feel sleepy and nauseated.  Upon PT evaluation, note limitations with cervical flex and B rotation, however following joing mobilizations to whole cervical spine, pt demo'd improved flexion and rotation and reports reduced pain.  Provided extensive HEP as she has MCD and will be covered for this eval only.  Pt is of low complexity and stable presentation.  Pt tolerated treatment very well and feel that exercises will benefit.  Also feel that she would benefit from follow up, however is limited due to MCD.     PT Frequency One time visit   PT  Treatment/Interventions ADLs/Self Care Home Management;Therapeutic exercise;Neuromuscular re-education   Consulted and Agree with Plan of Care Patient      Patient will benefit from skilled therapeutic intervention in order to improve the following deficits and impairments:     Visit Diagnosis: Cervicalgia - Plan: PT plan of care cert/re-cert  Abnormal posture - Plan: PT plan of care cert/re-cert     Problem List Patient Active Problem List   Diagnosis Date Noted  . Syncope and collapse 09/27/2015  . Nephrolithiasis 09/27/2015  . Pyelonephritis 09/27/2015  . Sepsis, unspecified organism (HCC) 09/27/2015  . UTI (lower urinary tract infection) 09/27/2015  . Leukocytosis   . Syncope 03/20/2012    Harriet Butte, PT, MPT [facility] [address] [place], [place], [address] Phone: [phone]   Fax:  [phone] 11/18/16, 9:34 AM  Name: SHERLINE EBERWEIN MRN: [id number] Date of Birth: [birth year]

## 2016-11-18 NOTE — Patient Instructions (Addendum)
    Headache Snag   Place towel at the top of your neck, below the base of your skull. Hold the towel straight in front of you and then pull upward to give yourself some traction.  While in traction, move your head down x 10 reps (hold for 3 secs).  Repeat doing side to side head turns (do R and then L while doing traction).    Traction in doorway:  Close band in door (knot and make sure it clicks) and lie on the ground with band around bones at base of your skull. This is traction. The further you move from the door, the more traction.  Now do a chin tuck (take whole head down towards ground).  Hold for 5 secs and repeat x 10 reps.        PRONE CHIN TUCK with Rotations  Start by lying face down with your elbows bent on table. Next, draw the back of your head upward towards the ceiling as you tuck your chin tuck towards your chest. The rotate your head towards the left then to the right.  Hold for a few seconds in each direction before returning to the middle.     Exercise on ball:   Start with bottom on the ground but knees bent.  Do chin tuck into ball, hold that and roll your body up the ball, then come back to ground and relax.  Repeat x 10 reps.      Do these 2-3 times per day.

## 2017-02-14 ENCOUNTER — Ambulatory Visit (INDEPENDENT_AMBULATORY_CARE_PROVIDER_SITE_OTHER): Payer: Medicaid Other | Admitting: Family Medicine

## 2017-02-14 ENCOUNTER — Encounter: Payer: Self-pay | Admitting: Family Medicine

## 2017-02-14 VITALS — BP 114/73 | HR 84 | Temp 99.7°F | Ht 61.0 in | Wt 183.0 lb

## 2017-02-14 DIAGNOSIS — J0100 Acute maxillary sinusitis, unspecified: Secondary | ICD-10-CM | POA: Diagnosis not present

## 2017-02-14 DIAGNOSIS — H66001 Acute suppurative otitis media without spontaneous rupture of ear drum, right ear: Secondary | ICD-10-CM | POA: Diagnosis not present

## 2017-02-14 MED ORDER — DM-GUAIFENESIN ER 60-1200 MG PO TB12: 1.0000 | ORAL_TABLET | Freq: Two times a day (BID) | ORAL | 0 refills | Status: DC

## 2017-02-14 MED ORDER — BENZONATATE 100 MG PO CAPS: 100.0000 mg | ORAL_CAPSULE | Freq: Two times a day (BID) | ORAL | 0 refills | Status: DC | PRN

## 2017-02-14 MED ORDER — CEFUROXIME AXETIL 250 MG PO TABS: 250.0000 mg | ORAL_TABLET | Freq: Two times a day (BID) | ORAL | 0 refills | Status: DC

## 2017-02-14 NOTE — Progress Notes (Addendum)
Chief Complaint  Patient presents with  . Cough    pt here today c/o cough, congestion and today her ears feel clogged.    HPI  Patient presents today for Patient presents with upper respiratory congestion. Rhinorrhea that is frequently purulent. There is moderate sore throat. Patient reports coughing frequently as well.  No sputum noted. There is subjective fever with chills and sweats. The patient denies being short of breath. Onset was 4 days ago. Gradually worsening. Tried OTCs without improvement.   PMH: Smoking status noted ROS: Per HPI  Objective: BP 114/73   Pulse 84   Temp 99.7 F (37.6 C) (Oral)   Ht 5\' 1"  (1.549 m)   Wt 183 lb (83 kg)   BMI 34.58 kg/m  Gen: NAD, alert, cooperative with exam HEENT: NCAT, EOMI, PERRL right TM is erythematous with middle ear effusion. Left is erythematous. Nasal passages swollen erythematous with right maxillary tenderness. CV: RRR, good S1/S2, no murmur Resp: CTABL, no wheezes, non-labored Abd: SNTND, BS present, no guarding or organomegaly Ext: No edema, warm Neuro: Alert and oriented, No gross deficits  Assessment and plan:  1. Acute suppurative otitis media of right ear without spontaneous rupture of tympanic membrane, recurrence not specified   2. Acute maxillary sinusitis, recurrence not specified     Meds ordered this encounter  Medications  . cefUROXime (CEFTIN) 250 MG tablet    Sig: Take 1 tablet (250 mg total) by mouth 2 (two) times daily with a meal.    Dispense:  20 tablet    Refill:  0  . benzonatate (TESSALON) 100 MG capsule    Sig: Take 1 capsule (100 mg total) by mouth 2 (two) times daily as needed for cough.    Dispense:  20 capsule    Refill:  0  . Dextromethorphan-Guaifenesin 60-1200 MG 12hr tablet    Sig: Take 1 tablet by mouth every 12 (twelve) hours.    Dispense:  30 tablet    Refill:  0      Follow up as needed.  Mechele Claude, MD

## 2017-02-15 ENCOUNTER — Ambulatory Visit: Payer: Medicaid Other | Admitting: Pediatrics

## 2017-02-23 ENCOUNTER — Encounter (INDEPENDENT_AMBULATORY_CARE_PROVIDER_SITE_OTHER): Payer: Medicaid Other

## 2017-02-24 ENCOUNTER — Encounter (INDEPENDENT_AMBULATORY_CARE_PROVIDER_SITE_OTHER): Payer: Self-pay | Admitting: Family Medicine

## 2017-02-24 ENCOUNTER — Encounter: Payer: Self-pay | Admitting: Neurology

## 2017-02-28 ENCOUNTER — Other Ambulatory Visit: Payer: Self-pay | Admitting: Neurology

## 2017-02-28 MED ORDER — PROPRANOLOL HCL ER 120 MG PO CP24: 120.0000 mg | ORAL_CAPSULE | Freq: Every day | ORAL | 11 refills | Status: DC

## 2017-02-28 NOTE — Telephone Encounter (Signed)
LVM FOR PT  TO CALL OFFICE TO R/S APPT

## 2017-02-28 NOTE — Telephone Encounter (Signed)
Can you assist with rescheduling?

## 2017-03-01 ENCOUNTER — Ambulatory Visit (INDEPENDENT_AMBULATORY_CARE_PROVIDER_SITE_OTHER): Payer: Self-pay | Admitting: Family Medicine

## 2017-03-13 ENCOUNTER — Ambulatory Visit (INDEPENDENT_AMBULATORY_CARE_PROVIDER_SITE_OTHER): Payer: Medicaid Other | Admitting: Family Medicine

## 2017-03-13 ENCOUNTER — Encounter (INDEPENDENT_AMBULATORY_CARE_PROVIDER_SITE_OTHER): Payer: Self-pay | Admitting: Family Medicine

## 2017-03-13 VITALS — BP 127/80 | HR 78 | Temp 98.3°F | Ht 62.0 in | Wt 181.0 lb

## 2017-03-13 DIAGNOSIS — R0602 Shortness of breath: Secondary | ICD-10-CM | POA: Diagnosis not present

## 2017-03-13 DIAGNOSIS — R739 Hyperglycemia, unspecified: Secondary | ICD-10-CM | POA: Diagnosis not present

## 2017-03-13 DIAGNOSIS — Z1389 Encounter for screening for other disorder: Secondary | ICD-10-CM

## 2017-03-13 DIAGNOSIS — E66811 Obesity, class 1: Secondary | ICD-10-CM | POA: Insufficient documentation

## 2017-03-13 DIAGNOSIS — Z6833 Body mass index (BMI) 33.0-33.9, adult: Secondary | ICD-10-CM

## 2017-03-13 DIAGNOSIS — R5383 Other fatigue: Secondary | ICD-10-CM

## 2017-03-13 DIAGNOSIS — E669 Obesity, unspecified: Secondary | ICD-10-CM | POA: Insufficient documentation

## 2017-03-13 DIAGNOSIS — Z1331 Encounter for screening for depression: Secondary | ICD-10-CM

## 2017-03-13 DIAGNOSIS — Z0289 Encounter for other administrative examinations: Secondary | ICD-10-CM

## 2017-03-13 NOTE — Progress Notes (Signed)
.  Office: [phone]  /  Fax: [phone]   HPI:   Chief Complaint: OBESITY  Lauren Cardenas (MR# [id number]) is a 31 y.o. female who presents on 03/13/2017 for obesity evaluation and treatment. Current BMI is Body mass index is 33.11 kg/m.Marland Kitchen Ezmae has struggled with obesity for years and has been unsuccessful in either losing weight or maintaining long term weight loss. Janaisha attended our information session and states she is currently in the action stage of change and ready to dedicate time achieving and maintaining a healthier weight.  Aleya lives with her in-laws and will have to eat differently from the family. Tejah states her family eats meals together she struggles with family and or coworkers weight loss sabotage her desired weight loss is 64 lbs or more she has been heavy most of  her life she started gaining weight after getting married her heaviest weight ever was 184 lbs. she has significant food cravings issues  she snacks frequently in the evenings she skips meals frequently she is frequently drinking liquids with calories she frequently makes poor food choices she has problems with excessive hunger  she frequently eats larger portions than normal  she has binge eating behaviors she struggles with emotional eating    Fatigue Allicia feels her energy is lower than it should be. This has worsened with weight gain and has not worsened recently. Tylena admits to daytime somnolence and admits to waking up still tired. Patient is at risk for obstructive sleep apnea. Patent has a history of symptoms of daytime fatigue, morning fatigue and morning headache. Patient generally gets 6 or 7 hours of sleep per night, and states they generally have restless sleep. Snoring is present. Apneic episodes are not present. Epworth Sleepiness Score is 7  Dyspnea on exertion Mallori notes increasing shortness of breath with exercising and seems to be worsening over time with weight  gain. She notes getting out of breath sooner with activity than she used to. This has not gotten worse recently. Cielle denies orthopnea.  Hyperglycemia Tamre has a history of fasting glucose of 126 last year and a positive family history of diabetes. She admits polyuria but she does have multiple renal and bladder issues.  Depression Screen Aerial's Food and Mood (modified PHQ-9) score was  Depression screen PHQ 2/9 03/13/2017  Decreased Interest 1  Down, Depressed, Hopeless 1  PHQ - 2 Score 2  Altered sleeping 3  Tired, decreased energy 3  Change in appetite 2  Feeling bad or failure about yourself  1  Trouble concentrating 1  Moving slowly or fidgety/restless 1  Suicidal thoughts 0  PHQ-9 Score 13  Difficult doing work/chores Very difficult    ALLERGIES: Allergies  Allergen Reactions  . Amoxicillin Hives    MEDICATIONS: Current Outpatient Prescriptions on File Prior to Visit  Medication Sig Dispense Refill  . acetaminophen (TYLENOL) 500 MG tablet Take 1,000 mg by mouth every 6 (six) hours as needed.    . cycloSPORINE (RESTASIS) 0.05 % ophthalmic emulsion Place 1 drop into both eyes 2 (two) times daily.    Marland Kitchen HYDROcodone-acetaminophen (NORCO) 7.5-325 MG tablet Take 1 tablet by mouth every 6 (six) hours as needed for moderate pain.    Marland Kitchen ibuprofen (ADVIL,MOTRIN) 200 MG tablet Take 400-800 mg by mouth 3 (three) times daily.     . Olopatadine HCl (PAZEO) 0.7 % SOLN Apply 1 drop to eye daily.    . promethazine (PHENERGAN) 25 MG tablet Take 25 mg by mouth as needed  for nausea or vomiting.    . propranolol ER (INDERAL LA) 120 MG 24 hr capsule Take 1 capsule (120 mg total) by mouth at bedtime. 30 capsule 11  . rizatriptan (MAXALT-MLT) 10 MG disintegrating tablet Take 1 tablet (10 mg total) by mouth 3 (three) times daily as needed for migraine. 9 tablet 3  . tamsulosin (FLOMAX) 0.4 MG CAPS capsule Take 0.4 mg by mouth as needed.    . cetirizine (ZYRTEC ALLERGY) 10 MG tablet Take  10 mg by mouth daily.    Marland Kitchen CRANBERRY PO Take 500 mg by mouth daily.    . fluticasone (FLONASE) 50 MCG/ACT nasal spray Place 1 spray into both nostrils as needed for allergies or rhinitis.     No current facility-administered medications on file prior to visit.     PAST MEDICAL HISTORY: Past Medical History:  Diagnosis Date  . Back pain   . Chronic migraine   . Constipation   . Dysuria   . GERD (gastroesophageal reflux disease)   . History of acute pyelonephritis    09-27-2015  . History of kidney stones   . HTN (hypertension)   . Interstitial cystitis   . Neck pain   . Nephrolithiasis    bilateral  non-obstructive per ct 09-27-2015  . OAB (overactive bladder)   . Obesity   . Right ureteral stone   . Shoulder pain   . Urgency of urination   . Vesicoureteral reflux     PAST SURGICAL HISTORY: Past Surgical History:  Procedure Laterality Date  . CYSTOSCOPY W/ URETERAL STENT PLACEMENT Right 09/27/2015   Procedure: CYSTOSCOPY WITH RETROGRADE PYELOGRAM/URETERAL STENT PLACEMENT;  Surgeon: Hildred Laser, MD;  Location: [facility];  Service: Urology;  Laterality: Right;  . CYSTOSCOPY WITH RETROGRADE PYELOGRAM, URETEROSCOPY AND STENT PLACEMENT Left 09/09/2014   Procedure: CYSTOSCOPY WITH LEFT RETROGRADE PYELOGRAM, URETEROSCOPY AND STONE EXTRACTION  DOUBLE J STENT PLACEMENT;  Surgeon: Jerilee Field, MD;  Location: [facility];  Service: Urology;  Laterality: Left;  . CYSTOSCOPY/URETEROSCOPY/HOLMIUM LASER/STENT PLACEMENT Right 10/16/2015   Procedure: RIGHT URETEROSCOPY/HOLMIUM LASER/STENT PLACEMENT;  Surgeon: Jerilee Field, MD;  Location: [facility];  Service: Urology;  Laterality: Right;  . HOLMIUM LASER APPLICATION Left 09/09/2014   Procedure: HOLMIUM LASER APPLICATION;  Surgeon: Jerilee Field, MD;  Location: [facility];  Service: Urology;  Laterality: Left;  . HOLMIUM LASER APPLICATION Right 10/16/2015   Procedure: HOLMIUM LASER APPLICATION;  Surgeon: Jerilee Field, MD;   Location: [facility];  Service: Urology;  Laterality: Right;  . STONE EXTRACTION WITH BASKET Right 10/16/2015   Procedure: STONE EXTRACTION WITH BASKET;  Surgeon: Jerilee Field, MD;  Location: [facility];  Service: Urology;  Laterality: Right;  . WISDOM TOOTH EXTRACTION  03/2014    SOCIAL HISTORY: Social History  Substance Use Topics  . Smoking status: Never Smoker  . Smokeless tobacco: Never Used  . Alcohol use No    FAMILY HISTORY: Family History  Problem Relation Age of Onset  . Hypertension Mother   . Obesity Mother   . Diabetes Father   . Kidney disease Father   . Sudden death Father     ROS: Review of Systems  Constitutional: Positive for malaise/fatigue.  HENT: Positive for tinnitus.   Eyes:       Wear Glasses or Contacts  Respiratory: Positive for shortness of breath (on exertion).   Cardiovascular: Negative for orthopnea.       Difficulty Breathing while Lying Down Very Cold Feet or Hands  Gastrointestinal: Positive for nausea.  Genitourinary: Positive for frequency.  Musculoskeletal: Positive for back pain and neck pain.       Neck Stiffness Muscle Stiffness  Neurological: Positive for headaches.  Psychiatric/Behavioral: The patient has insomnia.        Stress    PHYSICAL EXAM: Blood pressure 127/80, pulse 78, temperature 98.3 F (36.8 C), temperature source Oral, height  (1.575 m), weight 181 lb (82.1 kg), last menstrual period 02/16/2017, SpO2 96 %. Body mass index is 33.11 kg/m. Physical Exam  Constitutional: She is oriented to person, place, and time. She appears well-developed and well-nourished.  Cardiovascular: Normal rate.   Pulmonary/Chest: Effort normal.  Musculoskeletal: Normal range of motion.  Neurological: She is oriented to person, place, and time.  Skin: Skin is warm and dry.  Psychiatric: She has a normal mood and affect. Her behavior is normal.  Vitals reviewed.   RECENT LABS AND  TESTS: BMET    Component Value Date/Time   NA 141 03/28/2016 1307   K 4.8 03/28/2016 1307   CL 100 03/28/2016 1307   CO2 25 03/28/2016 1307   GLUCOSE 126 (H) 03/28/2016 1307   GLUCOSE 119 (H) 09/29/2015 0514   BUN 14 03/28/2016 1307   CREATININE 0.83 03/28/2016 1307   CALCIUM 9.0 03/28/2016 1307   GFRNONAA 94 03/28/2016 1307   GFRAA 109 03/28/2016 1307   No results found for: HGBA1C No results found for: INSULIN CBC    Component Value Date/Time   WBC 10.1 10/05/2015 0909   WBC 14.9 (H) 09/29/2015 0514   RBC 5.03 10/05/2015 0909   RBC 4.34 09/29/2015 0514   HGB 12.9 10/05/2015 0909   HCT 40.3 10/05/2015 0909   PLT 414 (H) 10/05/2015 0909   MCV 80 10/05/2015 0909   MCH 25.6 (L) 10/05/2015 0909   MCH 25.8 (L) 09/29/2015 0514   MCHC 32.0 10/05/2015 0909   MCHC 32.7 09/29/2015 0514   RDW 14.4 10/05/2015 0909   LYMPHSABS 0.8 09/28/2015 0929   MONOABS 0.6 09/28/2015 0929   EOSABS 0.0 09/28/2015 0929   BASOSABS 0.0 09/28/2015 0929   Iron/TIBC/Ferritin/ %Sat No results found for: IRON, TIBC, FERRITIN, IRONPCTSAT Lipid Panel  No results found for: CHOL, TRIG, HDL, CHOLHDL, VLDL, LDLCALC, LDLDIRECT Hepatic Function Panel     Component Value Date/Time   PROT 7.4 09/28/2015 0929   ALBUMIN 3.5 09/28/2015 0929   AST 28 09/28/2015 0929   ALT 63 (H) 09/28/2015 0929   ALKPHOS 69 09/28/2015 0929   BILITOT 0.4 09/28/2015 0929   No results found for: TSH  ECG  shows NSR with a rate of 76 BPM INDIRECT CALORIMETER done today shows a VO2 of 278 and a REE of 1936. Her calculated basal metabolic rate is 6045 thus her basal metabolic rate is better than expected.    ASSESSMENT AND PLAN: Other fatigue - Plan: EKG 12-Lead, Vitamin B12, CBC With Differential, Folate, VITAMIN D 25 Hydroxy (Vit-D Deficiency, Fractures), TSH, T4, free, T3, Lipid Panel With LDL/HDL Ratio  Shortness of breath on exertion  Hyperglycemia - Plan: Comprehensive metabolic panel, Hemoglobin A1c, Insulin,  random  Depression screening  Class 1 obesity with serious comorbidity and body mass index (BMI) of 33.0 to 33.9 in adult, unspecified obesity type  PLAN:  Fatigue Linh was informed that her fatigue may be related to obesity, depression or many other causes. Labs will be ordered, and in the meanwhile Vada has agreed to work on diet, exercise and weight loss to help with fatigue.  Proper sleep hygiene was discussed including the need for 7-8 hours of quality sleep each night. A sleep study was not ordered based on symptoms and Epworth score.  Dyspnea on exertion Ameenah's shortness of breath appears to be obesity related and exercise induced. She has agreed to work on weight loss and gradually increase exercise to treat her exercise induced shortness of breath. If Demisha follows our instructions and loses weight without improvement of her shortness of breath, we will plan to refer to pulmonology. We will monitor this condition regularly. Shauntee agrees to this plan.  Hyperglycemia We will check labs and Lourie agrees to work on diet, exercise and weight loss in the meanwhile. Maudean agrees to follow up with our clinic in 2 weeks.  Depression Screen Taura had a moderately positive depression screening. Depression is commonly associated with obesity and often results in emotional eating behaviors. We will monitor this closely and work on CBT to help improve the non-hunger eating patterns. Referral to Psychology may be required if no improvement is seen as she continues in our clinic.  Obesity Kiaria is currently in the action stage of change and her goal is to continue with weight loss efforts She has agreed to follow the Category 3 plan Jnya has been instructed to work up to a goal of 150 minutes of combined cardio and strengthening exercise per week for weight loss and overall health benefits. We discussed the following Behavioral Modification Strategies today: increasing lean  protein intake and decreasing simple carbohydrates   Bobby has agreed to follow up with our clinic in 2 weeks. She was informed of the importance of frequent follow up visits to maximize her success with intensive lifestyle modifications for her multiple health conditions. She was informed we would discuss her lab results at her next visit unless there is a critical issue that needs to be addressed sooner. Terecia agreed to keep her next visit at the agreed upon time to discuss these results.  I, [place] Crane, am acting as transcriptionist for Quillian Quince, MD  I have reviewed the above documentation for accuracy and completeness, and I agree with the above. -Quillian Quince, MD    OBESITY BEHAVIORAL INTERVENTION VISIT  Today's visit was # 1 out of 22.  Starting weight: 181 lbs Starting date: 03/13/17 Today's weight : 181 lbs  Today's date: 03/13/2017 Total lbs lost to date: 0 (Patients must lose 7 lbs in the first 6 months to continue with counseling)   ASK: We discussed the diagnosis of obesity with Mindi Curling today and Catilyn agreed to give [place] permission to discuss obesity behavioral modification therapy today.  ASSESS: Sila has the diagnosis of obesity and her BMI today is 33.1 Diedre is in the action stage of change   ADVISE: Micayla was educated on the multiple health risks of obesity as well as the benefit of weight loss to improve her health. She was advised of the need for long term treatment and the importance of lifestyle modifications.  AGREE: Multiple dietary modification options and treatment options were discussed and  Klaire agreed to follow the Category 3 plan We discussed the following Behavioral Modification Strategies today: increasing lean protein intake and decreasing simple carbohydrates

## 2017-03-15 LAB — T3: T3, Total: 144 ng/dL (ref 71–180)

## 2017-03-15 LAB — COMPREHENSIVE METABOLIC PANEL
ALBUMIN: 4.7 g/dL (ref 3.5–5.5)
ALT: 26 IU/L (ref 0–32)
AST: 23 IU/L (ref 0–40)
Albumin/Globulin Ratio: 1.5 (ref 1.2–2.2)
Alkaline Phosphatase: 66 IU/L (ref 39–117)
BUN/Creatinine Ratio: 12 (ref 9–23)
BUN: 9 mg/dL (ref 6–20)
Bilirubin Total: 0.3 mg/dL (ref 0.0–1.2)
CALCIUM: 9.5 mg/dL (ref 8.7–10.2)
CO2: 21 mmol/L (ref 20–29)
CREATININE: 0.78 mg/dL (ref 0.57–1.00)
Chloride: 101 mmol/L (ref 96–106)
GFR calc Af Amer: 116 mL/min/{1.73_m2} (ref >59)
GFR, EST NON AFRICAN AMERICAN: 101 mL/min/{1.73_m2} (ref >59)
Globulin, Total: 3.2 g/dL (ref 1.5–4.5)
Glucose: 78 mg/dL (ref 65–99)
Potassium: 4.6 mmol/L (ref 3.5–5.2)
SODIUM: 138 mmol/L (ref 134–144)
TOTAL PROTEIN: 7.9 g/dL (ref 6.0–8.5)

## 2017-03-15 LAB — CBC WITH DIFFERENTIAL
Basophils Absolute: 0 10*3/uL (ref 0.0–0.2)
Basos: 0 %
EOS (ABSOLUTE): 0.5 10*3/uL — ABNORMAL HIGH (ref 0.0–0.4)
EOS: 4 %
HEMATOCRIT: 43.9 % (ref 34.0–46.6)
HEMOGLOBIN: 14.2 g/dL (ref 11.1–15.9)
Immature Grans (Abs): 0 10*3/uL (ref 0.0–0.1)
Immature Granulocytes: 0 %
Lymphocytes Absolute: 2.7 10*3/uL (ref 0.7–3.1)
Lymphs: 21 %
MCH: 25.9 pg — ABNORMAL LOW (ref 26.6–33.0)
MCHC: 32.3 g/dL (ref 31.5–35.7)
MCV: 80 fL (ref 79–97)
MONOCYTES: 5 %
Monocytes Absolute: 0.6 10*3/uL (ref 0.1–0.9)
Neutrophils Absolute: 9 10*3/uL — ABNORMAL HIGH (ref 1.4–7.0)
Neutrophils: 70 %
RBC: 5.48 x10E6/uL — AB (ref 3.77–5.28)
RDW: 15.2 % (ref 12.3–15.4)
WBC: 12.9 10*3/uL — AB (ref 3.4–10.8)

## 2017-03-15 LAB — LIPID PANEL WITH LDL/HDL RATIO
CHOLESTEROL TOTAL: 182 mg/dL (ref 100–199)
HDL: 61 mg/dL (ref >39)
LDL Calculated: 77 mg/dL (ref 0–99)
LDL/HDL RATIO: 1.3 ratio (ref 0.0–3.2)
Triglycerides: 220 mg/dL — ABNORMAL HIGH (ref 0–149)
VLDL CHOLESTEROL CAL: 44 mg/dL — AB (ref 5–40)

## 2017-03-15 LAB — TSH: TSH: 1.72 u[IU]/mL (ref 0.450–4.500)

## 2017-03-15 LAB — HEMOGLOBIN A1C
ESTIMATED AVERAGE GLUCOSE: 128 mg/dL
HEMOGLOBIN A1C: 6.1 % — AB (ref 4.8–5.6)

## 2017-03-15 LAB — VITAMIN B12: VITAMIN B 12: 436 pg/mL (ref 232–1245)

## 2017-03-15 LAB — VITAMIN D 25 HYDROXY (VIT D DEFICIENCY, FRACTURES): VIT D 25 HYDROXY: 28.4 ng/mL — AB (ref 30.0–100.0)

## 2017-03-15 LAB — INSULIN, RANDOM: INSULIN: 44.5 u[IU]/mL — AB (ref 2.6–24.9)

## 2017-03-15 LAB — FOLATE: FOLATE: 11.3 ng/mL (ref >3.0)

## 2017-03-15 LAB — T4, FREE: Free T4: 1.81 ng/dL — ABNORMAL HIGH (ref 0.82–1.77)

## 2017-03-28 ENCOUNTER — Ambulatory Visit (INDEPENDENT_AMBULATORY_CARE_PROVIDER_SITE_OTHER): Payer: Medicaid Other | Admitting: Family Medicine

## 2017-03-28 VITALS — BP 114/79 | HR 71 | Temp 98.7°F | Ht 62.0 in | Wt 178.0 lb

## 2017-03-28 DIAGNOSIS — R7303 Prediabetes: Secondary | ICD-10-CM

## 2017-03-28 DIAGNOSIS — E669 Obesity, unspecified: Secondary | ICD-10-CM | POA: Diagnosis not present

## 2017-03-28 DIAGNOSIS — E559 Vitamin D deficiency, unspecified: Secondary | ICD-10-CM

## 2017-03-28 DIAGNOSIS — Z6832 Body mass index (BMI) 32.0-32.9, adult: Secondary | ICD-10-CM | POA: Diagnosis not present

## 2017-03-28 MED ORDER — METFORMIN HCL 500 MG PO TABS: 500.0000 mg | ORAL_TABLET | Freq: Every day | ORAL | 0 refills | Status: DC

## 2017-03-28 MED ORDER — VITAMIN D (ERGOCALCIFEROL) 1.25 MG (50000 UNIT) PO CAPS: 50000.0000 [IU] | ORAL_CAPSULE | ORAL | 0 refills | Status: DC

## 2017-03-28 NOTE — Progress Notes (Signed)
Office: [phone]  /  Fax: [phone]   HPI:   Chief Complaint: OBESITY Lauren Cardenas is here to discuss her progress with her obesity treatment plan. She is on the  follow the Category 3 plan and is following her eating plan approximately 100 % of the time. She states she is walking for 30 minutes 6 times per week. [name]  Did well with weight loss on the category 3 plan. She didn't like her breakfast options and would like to discuss more choices. Her weight is 178 lb (80.7 kg) today and has had a weight loss of 3 pounds over a period of 2 weeks since her last visit. She has lost 3 lbs since starting treatment with [place].  Vitamin D deficiency [name] has a diagnosis of vitamin D deficiency. She is currently taking OTC vit D and multi vitamin. She denies nausea, vomiting or muscle weakness.  Pre-Diabetes [name] has a new diagnosis of pre-diabetes based on her elevated Hgb A1c, elevated insulin and strong family history of diabetes and was informed this puts her at greater risk of developing diabetes. She is not taking metformin currently and continues to work on diet and exercise to decrease risk of diabetes. She admits polyphagia and denies nausea or hypoglycemia.   ALLERGIES: Allergies  Allergen Reactions  . Amoxicillin Hives    MEDICATIONS: Current Outpatient Prescriptions on File Prior to Visit  Medication Sig Dispense Refill  . acetaminophen (TYLENOL) 500 MG tablet Take 1,000 mg by mouth every 6 (six) hours as needed.    . cetirizine (ZYRTEC ALLERGY) 10 MG tablet Take 10 mg by mouth daily.    Marland Kitchen CRANBERRY PO Take 500 mg by mouth daily.    . cycloSPORINE (RESTASIS) 0.05 % ophthalmic emulsion Place 1 drop into both eyes 2 (two) times daily.    . fluticasone (FLONASE) 50 MCG/ACT nasal spray Place 1 spray into both nostrils as needed for allergies or rhinitis.    Marland Kitchen HYDROcodone-acetaminophen (NORCO) 7.5-325 MG tablet Take 1 tablet by mouth every 6 (six) hours as needed for moderate  pain.    Marland Kitchen ibuprofen (ADVIL,MOTRIN) 200 MG tablet Take 400-800 mg by mouth 3 (three) times daily.     . Olopatadine HCl (PAZEO) 0.7 % SOLN Apply 1 drop to eye daily.    . promethazine (PHENERGAN) 25 MG tablet Take 25 mg by mouth as needed for nausea or vomiting.    . propranolol ER (INDERAL LA) 120 MG 24 hr capsule Take 1 capsule (120 mg total) by mouth at bedtime. 30 capsule 11  . rizatriptan (MAXALT-MLT) 10 MG disintegrating tablet Take 1 tablet (10 mg total) by mouth 3 (three) times daily as needed for migraine. 9 tablet 3  . tamsulosin (FLOMAX) 0.4 MG CAPS capsule Take 0.4 mg by mouth as needed.     No current facility-administered medications on file prior to visit.     PAST MEDICAL HISTORY: Past Medical History:  Diagnosis Date  . Back pain   . Chronic migraine   . Constipation   . Dysuria   . GERD (gastroesophageal reflux disease)   . History of acute pyelonephritis    09-27-2015  . History of kidney stones   . HTN (hypertension)   . Interstitial cystitis   . Neck pain   . Nephrolithiasis    bilateral  non-obstructive per ct 09-27-2015  . OAB (overactive bladder)   . Obesity   . Right ureteral stone   . Shoulder pain   . Urgency of urination   .  Vesicoureteral reflux     PAST SURGICAL HISTORY: Past Surgical History:  Procedure Laterality Date  . CYSTOSCOPY W/ URETERAL STENT PLACEMENT Right 09/27/2015   Procedure: CYSTOSCOPY WITH RETROGRADE PYELOGRAM/URETERAL STENT PLACEMENT;  Surgeon: Hildred Laser, MD;  Location: [facility];  Service: Urology;  Laterality: Right;  . CYSTOSCOPY WITH RETROGRADE PYELOGRAM, URETEROSCOPY AND STENT PLACEMENT Left 09/09/2014   Procedure: CYSTOSCOPY WITH LEFT RETROGRADE PYELOGRAM, URETEROSCOPY AND STONE EXTRACTION  DOUBLE J STENT PLACEMENT;  Surgeon: Jerilee Field, MD;  Location: [facility];  Service: Urology;  Laterality: Left;  . CYSTOSCOPY/URETEROSCOPY/HOLMIUM LASER/STENT PLACEMENT Right 10/16/2015   Procedure: RIGHT URETEROSCOPY/HOLMIUM  LASER/STENT PLACEMENT;  Surgeon: Jerilee Field, MD;  Location[facility];  Service: Urology;  Laterality: Right;  . HOLMIUM LASER APPLICATION Left 09/09/2014   Procedure: HOLMIUM LASER APPLICATION;  Surgeon: Jerilee Field, MD;  Location: [facility];  Service: Urology;  Laterality: Left;  . HOLMIUM LASER APPLICATION Right 10/16/2015   Procedure: HOLMIUM LASER APPLICATION;  Surgeon: Jerilee Field, MD;  Location: [facility];  Service: Urology;  Laterality: Right;  . STONE EXTRACTION WITH BASKET Right 10/16/2015   Procedure: STONE EXTRACTION WITH BASKET;  Surgeon: Jerilee Field, MD;  Location: [facility];  Service: Urology;  Laterality: Right;  . WISDOM TOOTH EXTRACTION  03/2014    SOCIAL HISTORY: Social History  Substance Use Topics  . Smoking status: Never Smoker  . Smokeless tobacco: Never Used  . Alcohol use No    FAMILY HISTORY: Family History  Problem Relation Age of Onset  . Hypertension Mother   . Obesity Mother   . Diabetes Father   . Kidney disease Father   . Sudden death Father     ROS: Review of Systems  Constitutional: Positive for weight loss.  Gastrointestinal: Negative for nausea and vomiting.  Musculoskeletal:       Negative muscle weakness  Endo/Heme/Allergies:       Positive polyphagia Negative hypoglycemia    PHYSICAL EXAM: Blood pressure 114/79, pulse 71, temperature 98.7 F (37.1 C), temperature source Oral, height  (1.575 m), weight 178 lb (80.7 kg), last menstrual period 03/18/2017, SpO2 100 %. Body mass index is 32.56 kg/m. Physical Exam  Constitutional: She is oriented to person, place, and time. She appears well-developed and well-nourished.  Cardiovascular: Normal rate.   Pulmonary/Chest: Effort normal.  Musculoskeletal: Normal range of motion.  Neurological: She is oriented to person, place, and time.  Skin: Skin is warm and dry.  Psychiatric: She has a normal mood and affect. Her  behavior is normal.  Vitals reviewed.   RECENT LABS AND TESTS: BMET    Component Value Date/Time   NA 138 03/13/2017 1119   K 4.6 03/13/2017 1119   CL 101 03/13/2017 1119   CO2 21 03/13/2017 1119   GLUCOSE 78 03/13/2017 1119   GLUCOSE 119 (H) 09/29/2015 0514   BUN 9 03/13/2017 1119   CREATININE 0.78 03/13/2017 1119   CALCIUM 9.5 03/13/2017 1119   GFRNONAA 101 03/13/2017 1119   GFRAA 116 03/13/2017 1119   Lab Results  Component Value Date   HGBA1C 6.1 (H) 03/13/2017   Lab Results  Component Value Date   INSULIN 44.5 (H) 03/13/2017   CBC    Component Value Date/Time   WBC 12.9 (H) 03/13/2017 1119   WBC 14.9 (H) 09/29/2015 0514   RBC 5.48 (H) 03/13/2017 1119   RBC 4.34 09/29/2015 0514   HGB 14.2 03/13/2017 1119   HCT 43.9 03/13/2017 1119   PLT  414 (H) 10/05/2015 0909   MCV 80 03/13/2017 1119   MCH 25.9 (L) 03/13/2017 1119   MCH 25.8 (L) 09/29/2015 0514   MCHC 32.3 03/13/2017 1119   MCHC 32.7 09/29/2015 0514   RDW 15.2 03/13/2017 1119   LYMPHSABS 2.7 03/13/2017 1119   MONOABS 0.6 09/28/2015 0929   EOSABS 0.5 (H) 03/13/2017 1119   BASOSABS 0.0 03/13/2017 1119   Iron/TIBC/Ferritin/ %Sat No results found for: IRON, TIBC, FERRITIN, IRONPCTSAT Lipid Panel     Component Value Date/Time   CHOL 182 03/13/2017 1119   TRIG 220 (H) 03/13/2017 1119   HDL 61 03/13/2017 1119   LDLCALC 77 03/13/2017 1119   Hepatic Function Panel     Component Value Date/Time   PROT 7.9 03/13/2017 1119   ALBUMIN 4.7 03/13/2017 1119   AST 23 03/13/2017 1119   ALT 26 03/13/2017 1119   ALKPHOS 66 03/13/2017 1119   BILITOT 0.3 03/13/2017 1119      Component Value Date/Time   TSH 1.720 03/13/2017 1119    ASSESSMENT AND PLAN: Vitamin D deficiency - Plan: Vitamin D, Ergocalciferol, (DRISDOL) 50000 units CAPS capsule  Prediabetes - Plan: metFORMIN (GLUCOPHAGE) 500 MG tablet  Class 1 obesity with serious comorbidity and body mass index (BMI) of 32.0 to 32.9 in adult, unspecified  obesity type  PLAN:  Vitamin D Deficiency Nikyah was informed that low vitamin D levels contributes to fatigue and are associated with obesity, breast, and colon cancer. She agrees to start to take prescription Vit D ,000 IU every week #4 with no refills and will follow up for routine testing of vitamin D, at least 2-3 times per year. She was informed of the risk of over-replacement of vitamin D and agrees to not increase her dose unless he discusses this with [place] first. Cayden agrees to follow up with our clinic in 2 to 3 weeks.  Pre-Diabetes Shaindel will continue to work on weight loss, exercise, and decreasing simple carbohydrates in her diet to help decrease the risk of diabetes. We dicussed metformin including benefits and risks. She was informed that eating too many simple carbohydrates or too many calories at one sitting increases the likelihood of GI side effects. Zondra agrees to start  metformin for now and a prescription was written today for metformin 500 mg qam #30 with no refills. Dagmar agreed to follow up with [place] as directed to monitor her progress.  Obesity Laterria is currently in the action stage of change. As such, her goal is to continue with weight loss efforts She has agreed to follow the Category 3 plan Orma has been instructed to work up to a goal of 150 minutes of combined cardio and strengthening exercise per week for weight loss and overall health benefits. We discussed the following Behavioral Modification Strategies today: increasing lean protein intake, decreasing simple carbohydrates and decrease eating out  Johann has agreed to follow up with our clinic in 2 to 3 weeks. She was informed of the importance of frequent follow up visits to maximize her success with intensive lifestyle modifications for her multiple health conditions.  I, Nevada Crane, am acting as transcriptionist for Quillian Quince, MD  I have reviewed the above documentation for accuracy  and completeness, and I agree with the above. -Quillian Quince, MD    OBESITY BEHAVIORAL INTERVENTION VISIT  Today's visit was # 2 out of 22.  Starting weight: 181 lbs Starting date: 03/13/17 Today's weight : 178 lbs  Today's date: 03/28/2017 Total lbs lost to  date: 3 (Patients must lose 7 lbs in the first 6 months to continue with counseling)   ASK: We discussed the diagnosis of obesity with Mindi Curling today and Davis agreed to give [place] permission to discuss obesity behavioral modification therapy today.  ASSESS: Isaac has the diagnosis of obesity and her BMI today is 32.55 Wynnie is in the action stage of change   ADVISE: Tumeka was educated on the multiple health risks of obesity as well as the benefit of weight loss to improve her health. She was advised of the need for long term treatment and the importance of lifestyle modifications.  AGREE: Multiple dietary modification options and treatment options were discussed and  Akeiba agreed to follow the Category 3 plan We discussed the following Behavioral Modification Strategies today: increasing lean protein intake, decreasing simple carbohydrates and decrease eating out

## 2017-03-29 ENCOUNTER — Telehealth: Payer: Self-pay | Admitting: Family Medicine

## 2017-03-29 NOTE — Telephone Encounter (Signed)
What type of referral do you need? oby/gyn  Have you been seen at our office for this problem? No, she wants to see OB/GYN for birth control, she has her papsmear done there  (If no, schedule them an appointment.  They will need to be seen before a referral can be done.)  Is there a particular doctor or location that you prefer? [facility]  Patient notified that referrals can take up to a week or longer to process. If they haven't heard anything within a week they should call back and speak with the referral department.

## 2017-03-30 ENCOUNTER — Other Ambulatory Visit: Payer: Self-pay

## 2017-03-30 DIAGNOSIS — Z124 Encounter for screening for malignant neoplasm of cervix: Secondary | ICD-10-CM

## 2017-04-11 ENCOUNTER — Ambulatory Visit (INDEPENDENT_AMBULATORY_CARE_PROVIDER_SITE_OTHER): Payer: Medicaid Other | Admitting: Family Medicine

## 2017-04-11 VITALS — BP 108/72 | HR 70 | Temp 98.1°F | Ht 62.0 in | Wt 175.0 lb

## 2017-04-11 DIAGNOSIS — E669 Obesity, unspecified: Secondary | ICD-10-CM | POA: Diagnosis not present

## 2017-04-11 DIAGNOSIS — R7303 Prediabetes: Secondary | ICD-10-CM | POA: Diagnosis not present

## 2017-04-11 DIAGNOSIS — Z6832 Body mass index (BMI) 32.0-32.9, adult: Secondary | ICD-10-CM

## 2017-04-11 NOTE — Progress Notes (Signed)
Office: [phone]  /  Fax: [phone]   HPI:   Chief Complaint: OBESITY Lauren Cardenas is here to discuss her progress with her obesity treatment plan. She is on the Category 3 plan and is following her eating plan approximately 95 % of the time. She states she is walking for 20 minutes 3 times per week. Lauren Cardenas continues to do well with weight loss on the category 3 plan but is struggling to eat all her options. Her weight is 175 lb (79.4 kg) today and has had a weight loss of 3 pounds over a period of 2 weeks since her last visit. She has lost 6 lbs since starting treatment with [place].  Pre-Diabetes Lauren Cardenas has a diagnosis of pre-diabetes based on her elevated Hgb A1c and was informed this puts her at greater risk of developing diabetes. She  started metformin and has had mild GI upset but this is very tolerable. Lauren Cardenas continues to work on diet and exercise to decrease risk of diabetes. She denies hypoglycemia.   ALLERGIES: Allergies  Allergen Reactions  . Amoxicillin Hives    MEDICATIONS: Current Outpatient Prescriptions on File Prior to Visit  Medication Sig Dispense Refill  . acetaminophen (TYLENOL) 500 MG tablet Take 1,000 mg by mouth every 6 (six) hours as needed.    . cetirizine (ZYRTEC ALLERGY) 10 MG tablet Take 10 mg by mouth daily.    Marland Kitchen CRANBERRY PO Take 500 mg by mouth daily.    . cycloSPORINE (RESTASIS) 0.05 % ophthalmic emulsion Place 1 drop into both eyes 2 (two) times daily.    . fluticasone (FLONASE) 50 MCG/ACT nasal spray Place 1 spray into both nostrils as needed for allergies or rhinitis.    Marland Kitchen HYDROcodone-acetaminophen (NORCO) 7.5-325 MG tablet Take 1 tablet by mouth every 6 (six) hours as needed for moderate pain.    Marland Kitchen ibuprofen (ADVIL,MOTRIN) 200 MG tablet Take 400-800 mg by mouth 3 (three) times daily.     . metFORMIN (GLUCOPHAGE) 500 MG tablet Take 1 tablet (500 mg total) by mouth daily with breakfast. 30 tablet 0  . Multiple Vitamins-Minerals (CENTRUM  MULTIGUMMIES) CHEW Chew 1 tablet by mouth daily.    . Olopatadine HCl (PAZEO) 0.7 % SOLN Apply 1 drop to eye daily.    . promethazine (PHENERGAN) 25 MG tablet Take 25 mg by mouth as needed for nausea or vomiting.    . propranolol ER (INDERAL LA) 120 MG 24 hr capsule Take 1 capsule (120 mg total) by mouth at bedtime. 30 capsule 11  . rizatriptan (MAXALT-MLT) 10 MG disintegrating tablet Take 1 tablet (10 mg total) by mouth 3 (three) times daily as needed for migraine. 9 tablet 3  . tamsulosin (FLOMAX) 0.4 MG CAPS capsule Take 0.4 mg by mouth as needed.    . Vitamin D, Ergocalciferol, (DRISDOL) 50000 units CAPS capsule Take 1 capsule (50,000 Units total) by mouth every 7 (seven) days. 4 capsule 0   No current facility-administered medications on file prior to visit.     PAST MEDICAL HISTORY: Past Medical History:  Diagnosis Date  . Back pain   . Chronic migraine   . Constipation   . Dysuria   . GERD (gastroesophageal reflux disease)   . History of acute pyelonephritis    09-27-2015  . History of kidney stones   . HTN (hypertension)   . Interstitial cystitis   . Neck pain   . Nephrolithiasis    bilateral  non-obstructive per ct 09-27-2015  . OAB (overactive bladder)   .  Obesity   . Right ureteral stone   . Shoulder pain   . Urgency of urination   . Vesicoureteral reflux     PAST SURGICAL HISTORY: Past Surgical History:  Procedure Laterality Date  . CYSTOSCOPY W/ URETERAL STENT PLACEMENT Right 09/27/2015   Procedure: CYSTOSCOPY WITH RETROGRADE PYELOGRAM/URETERAL STENT PLACEMENT;  Surgeon: Hildred Laser, MD;  Location: [facility];  Service: Urology;  Laterality: Right;  . CYSTOSCOPY WITH RETROGRADE PYELOGRAM, URETEROSCOPY AND STENT PLACEMENT Left 09/09/2014   Procedure: CYSTOSCOPY WITH LEFT RETROGRADE PYELOGRAM, URETEROSCOPY AND STONE EXTRACTION  DOUBLE J STENT PLACEMENT;  Surgeon: Jerilee Field, MD;  Location: [facility];  Service: Urology;  Laterality: Left;  .  CYSTOSCOPY/URETEROSCOPY/HOLMIUM LASER/STENT PLACEMENT Right 10/16/2015   Procedure: RIGHT URETEROSCOPY/HOLMIUM LASER/STENT PLACEMENT;  Surgeon: Jerilee Field, MD;  Location: [facility];  Service: Urology;  Laterality: Right;  . HOLMIUM LASER APPLICATION Left 09/09/2014   Procedure: HOLMIUM LASER APPLICATION;  Surgeon: Jerilee Field, MD;  Location: [facility];  Service: Urology;  Laterality: Left;  . HOLMIUM LASER APPLICATION Right 10/16/2015   Procedure: HOLMIUM LASER APPLICATION;  Surgeon: Jerilee Field, MD;  Location: [facility];  Service: Urology;  Laterality: Right;  . STONE EXTRACTION WITH BASKET Right 10/16/2015   Procedure: STONE EXTRACTION WITH BASKET;  Surgeon: Jerilee Field, MD;  Location: [facility];  Service: Urology;  Laterality: Right;  . WISDOM TOOTH EXTRACTION  03/2014    SOCIAL HISTORY: Social History  Substance Use Topics  . Smoking status: Never Smoker  . Smokeless tobacco: Never Used  . Alcohol use No    FAMILY HISTORY: Family History  Problem Relation Age of Onset  . Hypertension Mother   . Obesity Mother   . Diabetes Father   . Kidney disease Father   . Sudden death Father     ROS: Review of Systems  Constitutional: Positive for weight loss.  Gastrointestinal: Positive for diarrhea and nausea.  Endo/Heme/Allergies:       Negative hypoglycemia    PHYSICAL EXAM: Blood pressure 108/72, pulse 70, temperature 98.1 F (36.7 C), temperature source Oral, height  (1.575 m), weight 175 lb (79.4 kg), last menstrual period 03/18/2017, SpO2 98 %. Body mass index is 32.01 kg/m. Physical Exam  Constitutional: She is oriented to person, place, and time. She appears well-developed and well-nourished.  Cardiovascular: Normal rate.   Pulmonary/Chest: Effort normal.  Musculoskeletal: Normal range of motion.  Neurological: She is oriented to person, place, and time.  Skin: Skin is warm and dry.  Psychiatric:  She has a normal mood and affect. Her behavior is normal.  Vitals reviewed.   RECENT LABS AND TESTS: BMET    Component Value Date/Time   NA 138 03/13/2017 1119   K 4.6 03/13/2017 1119   CL 101 03/13/2017 1119   CO2 21 03/13/2017 1119   GLUCOSE 78 03/13/2017 1119   GLUCOSE 119 (H) 09/29/2015 0514   BUN 9 03/13/2017 1119   CREATININE 0.78 03/13/2017 1119   CALCIUM 9.5 03/13/2017 1119   GFRNONAA 101 03/13/2017 1119   GFRAA 116 03/13/2017 1119   Lab Results  Component Value Date   HGBA1C 6.1 (H) 03/13/2017   Lab Results  Component Value Date   INSULIN 44.5 (H) 03/13/2017   CBC    Component Value Date/Time   WBC 12.9 (H) 03/13/2017 1119   WBC 14.9 (H) 09/29/2015 0514   RBC 5.48 (H) 03/13/2017 1119   RBC 4.34 09/29/2015 0514   HGB 14.2 03/13/2017 1119  HCT 43.9 03/13/2017 1119   PLT 414 (H) 10/05/2015 0909   MCV 80 03/13/2017 1119   MCH 25.9 (L) 03/13/2017 1119   MCH 25.8 (L) 09/29/2015 0514   MCHC 32.3 03/13/2017 1119   MCHC 32.7 09/29/2015 0514   RDW 15.2 03/13/2017 1119   LYMPHSABS 2.7 03/13/2017 1119   MONOABS 0.6 09/28/2015 0929   EOSABS 0.5 (H) 03/13/2017 1119   BASOSABS 0.0 03/13/2017 1119   Iron/TIBC/Ferritin/ %Sat No results found for: IRON, TIBC, FERRITIN, IRONPCTSAT Lipid Panel     Component Value Date/Time   CHOL 182 03/13/2017 1119   TRIG 220 (H) 03/13/2017 1119   HDL 61 03/13/2017 1119   LDLCALC 77 03/13/2017 1119   Hepatic Function Panel     Component Value Date/Time   PROT 7.9 03/13/2017 1119   ALBUMIN 4.7 03/13/2017 1119   AST 23 03/13/2017 1119   ALT 26 03/13/2017 1119   ALKPHOS 66 03/13/2017 1119   BILITOT 0.3 03/13/2017 1119      Component Value Date/Time   TSH 1.720 03/13/2017 1119    ASSESSMENT AND PLAN: Prediabetes  Class 1 obesity with serious comorbidity and body mass index (BMI) of 32.0 to 32.9 in adult, unspecified obesity type  PLAN:  Pre-Diabetes Lauren Cardenas will continue to work on weight loss, exercise, and  decreasing simple carbohydrates in her diet to help decrease the risk of diabetes. We dicussed metformin including benefits and risks. She was informed that eating too many simple carbohydrates or too many calories at one sitting increases the likelihood of GI side effects. Lauren Cardenas agrees to continue metformin as prescribed for now and a prescription was not written today. Lauren Cardenas agreed to follow up with [place] as directed to monitor her progress.  We spent > than 50% of the 15 minute visit on the counseling as documented in the note.  Obesity Lauren Cardenas is currently in the action stage of change. As such, her goal is to continue with weight loss efforts She has agreed to follow the Category 3 plan Lauren Cardenas has been instructed to work up to a goal of 150 minutes of combined cardio and strengthening exercise per week for weight loss and overall health benefits. We discussed exercise ball options 15 minutes per day. We discussed the following Behavioral Modification Strategies today: no skipping meals, increasing lean protein intake, decreasing simple carbohydrates, decrease eating out and holiday eating strategies   Lauren Cardenas has agreed to follow up with our clinic in 2 weeks. She was informed of the importance of frequent follow up visits to maximize her success with intensive lifestyle modifications for her multiple health conditions.  I, Nevada Crane, am acting as transcriptionist for Lauren Quince, MD  I have reviewed the above documentation for accuracy and completeness, and I agree with the above. -Lauren Quince, MD   OBESITY BEHAVIORAL INTERVENTION VISIT  Today's visit was # 3 out of 22.  Starting weight: 181 lbs Starting date: 03/13/17 Today's weight : 175 lbs Today's date: 04/11/2017 Total lbs lost to date: 6 (Patients must lose 7 lbs in the first 6 months to continue with counseling)   ASK: We discussed the diagnosis of obesity with Lauren Cardenas today and Lauren Cardenas agreed to give [place]  permission to discuss obesity behavioral modification therapy today.  ASSESS: Lauren Cardenas has the diagnosis of obesity and her BMI today is 32 Lauren Cardenas is in the action stage of change   ADVISE: Lauren Cardenas was educated on the multiple health risks of obesity as well as the benefit of  weight loss to improve her health. She was advised of the need for long term treatment and the importance of lifestyle modifications.  AGREE: Multiple dietary modification options and treatment options were discussed and  Lauren Cardenas agreed to follow the Category 3 plan We discussed the following Behavioral Modification Strategies today: no skipping meals, increasing lean protein intake, decreasing simple carbohydrates, decrease eating out and holiday eating strategies

## 2017-04-26 ENCOUNTER — Ambulatory Visit (INDEPENDENT_AMBULATORY_CARE_PROVIDER_SITE_OTHER): Payer: Medicaid Other | Admitting: Family Medicine

## 2017-04-26 VITALS — BP 100/60 | HR 77 | Temp 98.7°F | Ht 62.0 in | Wt 172.0 lb

## 2017-04-26 DIAGNOSIS — E669 Obesity, unspecified: Secondary | ICD-10-CM

## 2017-04-26 DIAGNOSIS — R7303 Prediabetes: Secondary | ICD-10-CM

## 2017-04-26 DIAGNOSIS — E559 Vitamin D deficiency, unspecified: Secondary | ICD-10-CM

## 2017-04-26 DIAGNOSIS — Z6831 Body mass index (BMI) 31.0-31.9, adult: Secondary | ICD-10-CM | POA: Diagnosis not present

## 2017-04-26 MED ORDER — VITAMIN D (ERGOCALCIFEROL) 1.25 MG (50000 UNIT) PO CAPS: 50000.0000 [IU] | ORAL_CAPSULE | ORAL | 0 refills | Status: DC

## 2017-04-26 MED ORDER — METFORMIN HCL 500 MG PO TABS: 500.0000 mg | ORAL_TABLET | Freq: Every day | ORAL | 0 refills | Status: DC

## 2017-04-26 NOTE — Progress Notes (Signed)
Office: [phone]  /  Fax: [phone]   HPI:   Chief Complaint: OBESITY Lauren Cardenas is here to discuss her progress with her obesity treatment plan. She is on the Category 3 plan and is following her eating plan approximately 85 % of the time. She states she is walking 30 minutes 3 times per week. Lauren Cardenas continues to do well with weight loss on the Category 3 plan. Her hunger is controlled.  Her weight is 172 lb (78 kg) today and has had a weight loss of 3 pounds over a period of 2 weeks since her last visit. She has lost 9 lbs since starting treatment with [place].  Pre-Diabetes Lauren Cardenas has a diagnosis of pre-diabetes based on her elevated Hgb A1c and was informed this puts her at greater risk of developing diabetes. She is on metformin and doing very well on her diet prescription and continues to work on diet and exercise to decrease risk of diabetes. She denies nausea, vomiting or hypoglycemia.  Vitamin D deficiency Lauren Cardenas has a diagnosis of vitamin D deficiency. She is currently taking prescription Vit D and she is stable. Her fatigue is improving and she denies nausea, vomiting or muscle weakness.  ALLERGIES: Allergies  Allergen Reactions  . Amoxicillin Hives    MEDICATIONS: Current Outpatient Prescriptions on File Prior to Visit  Medication Sig Dispense Refill  . acetaminophen (TYLENOL) 500 MG tablet Take 1,000 mg by mouth every 6 (six) hours as needed.    . cetirizine (ZYRTEC ALLERGY) 10 MG tablet Take 10 mg by mouth daily.    Marland Kitchen CRANBERRY PO Take 500 mg by mouth daily.    . cycloSPORINE (RESTASIS) 0.05 % ophthalmic emulsion Place 1 drop into both eyes 2 (two) times daily.    . fluticasone (FLONASE) 50 MCG/ACT nasal spray Place 1 spray into both nostrils as needed for allergies or rhinitis.    Marland Kitchen HYDROcodone-acetaminophen (NORCO) 7.5-325 MG tablet Take 1 tablet by mouth every 6 (six) hours as needed for moderate pain.    Marland Kitchen ibuprofen (ADVIL,MOTRIN) 200 MG tablet Take 400-800 mg  by mouth 3 (three) times daily.     . metFORMIN (GLUCOPHAGE) 500 MG tablet Take 1 tablet (500 mg total) by mouth daily with breakfast. 30 tablet 0  . Multiple Vitamins-Minerals (CENTRUM MULTIGUMMIES) CHEW Chew 1 tablet by mouth daily.    . Olopatadine HCl (PAZEO) 0.7 % SOLN Apply 1 drop to eye daily.    . promethazine (PHENERGAN) 25 MG tablet Take 25 mg by mouth as needed for nausea or vomiting.    . propranolol ER (INDERAL LA) 120 MG 24 hr capsule Take 1 capsule (120 mg total) by mouth at bedtime. 30 capsule 11  . rizatriptan (MAXALT-MLT) 10 MG disintegrating tablet Take 1 tablet (10 mg total) by mouth 3 (three) times daily as needed for migraine. 9 tablet 3  . tamsulosin (FLOMAX) 0.4 MG CAPS capsule Take 0.4 mg by mouth as needed.    . Vitamin D, Ergocalciferol, (DRISDOL) 50000 units CAPS capsule Take 1 capsule (50,000 Units total) by mouth every 7 (seven) days. 4 capsule 0   No current facility-administered medications on file prior to visit.     PAST MEDICAL HISTORY: Past Medical History:  Diagnosis Date  . Back pain   . Chronic migraine   . Constipation   . Dysuria   . GERD (gastroesophageal reflux disease)   . History of acute pyelonephritis    09-27-2015  . History of kidney stones   . HTN (hypertension)   .  Interstitial cystitis   . Neck pain   . Nephrolithiasis    bilateral  non-obstructive per ct 09-27-2015  . OAB (overactive bladder)   . Obesity   . Right ureteral stone   . Shoulder pain   . Urgency of urination   . Vesicoureteral reflux     PAST SURGICAL HISTORY: Past Surgical History:  Procedure Laterality Date  . CYSTOSCOPY W/ URETERAL STENT PLACEMENT Right 09/27/2015   Procedure: CYSTOSCOPY WITH RETROGRADE PYELOGRAM/URETERAL STENT PLACEMENT;  Surgeon: Hildred Laser, MD;  Location: [facility];  Service: Urology;  Laterality: Right;  . CYSTOSCOPY WITH RETROGRADE PYELOGRAM, URETEROSCOPY AND STENT PLACEMENT Left 09/09/2014   Procedure: CYSTOSCOPY WITH LEFT  RETROGRADE PYELOGRAM, URETEROSCOPY AND STONE EXTRACTION  DOUBLE J STENT PLACEMENT;  Surgeon: Jerilee Field, MD;  Location: [facility];  Service: Urology;  Laterality: Left;  . CYSTOSCOPY/URETEROSCOPY/HOLMIUM LASER/STENT PLACEMENT Right 10/16/2015   Procedure: RIGHT URETEROSCOPY/HOLMIUM LASER/STENT PLACEMENT;  Surgeon: Jerilee Field, MD;  Location: [facility];  Service: Urology;  Laterality: Right;  . HOLMIUM LASER APPLICATION Left 09/09/2014   Procedure: HOLMIUM LASER APPLICATION;  Surgeon: Jerilee Field, MD;  Location: [facility];  Service: Urology;  Laterality: Left;  . HOLMIUM LASER APPLICATION Right 10/16/2015   Procedure: HOLMIUM LASER APPLICATION;  Surgeon: Jerilee Field, MD;  Location: [facility];  Service: Urology;  Laterality: Right;  . STONE EXTRACTION WITH BASKET Right 10/16/2015   Procedure: STONE EXTRACTION WITH BASKET;  Surgeon: Jerilee Field, MD;  Location: [facility];  Service: Urology;  Laterality: Right;  . WISDOM TOOTH EXTRACTION  03/2014    SOCIAL HISTORY: Social History  Substance Use Topics  . Smoking status: Never Smoker  . Smokeless tobacco: Never Used  . Alcohol use No    FAMILY HISTORY: Family History  Problem Relation Age of Onset  . Hypertension Mother   . Obesity Mother   . Diabetes Father   . Kidney disease Father   . Sudden death Father     ROS: Review of Systems  Constitutional: Positive for malaise/fatigue and weight loss.  Gastrointestinal: Negative for nausea and vomiting.  Musculoskeletal:       Negative muscle weakness  Endo/Heme/Allergies:       Negative hypoglycemia    PHYSICAL EXAM: Blood pressure 100/60, pulse 77, temperature 98.7 F (37.1 C), temperature source Oral, height 5\' 2"  (1.575 m), weight 172 lb (78 kg), SpO2 98 %. Body mass index is 31.46 kg/m. Physical Exam  Constitutional: She is oriented to person, place, and time. She appears well-developed and well-nourished.    Cardiovascular: Normal rate.   Pulmonary/Chest: Effort normal.  Musculoskeletal: Normal range of motion.  Neurological: She is oriented to person, place, and time.  Skin: Skin is warm and dry.  Psychiatric: She has a normal mood and affect. Her behavior is normal.  Vitals reviewed.   RECENT LABS AND TESTS: BMET    Component Value Date/Time   NA 138 03/13/2017 1119   K 4.6 03/13/2017 1119   CL 101 03/13/2017 1119   CO2 21 03/13/2017 1119   GLUCOSE 78 03/13/2017 1119   GLUCOSE 119 (H) 09/29/2015 0514   BUN 9 03/13/2017 1119   CREATININE 0.78 03/13/2017 1119   CALCIUM 9.5 03/13/2017 1119   GFRNONAA 101 03/13/2017 1119   GFRAA 116 03/13/2017 1119   Lab Results  Component Value Date   HGBA1C 6.1 (H) 03/13/2017   Lab Results  Component Value Date   INSULIN 44.5 (H) 03/13/2017   CBC  Component Value Date/Time   WBC 12.9 (H) 03/13/2017 1119   WBC 14.9 (H) 09/29/2015 0514   RBC 5.48 (H) 03/13/2017 1119   RBC 4.34 09/29/2015 0514   HGB 14.2 03/13/2017 1119   HCT 43.9 03/13/2017 1119   PLT 414 (H) 10/05/2015 0909   MCV 80 03/13/2017 1119   MCH 25.9 (L) 03/13/2017 1119   MCH 25.8 (L) 09/29/2015 0514   MCHC 32.3 03/13/2017 1119   MCHC 32.7 09/29/2015 0514   RDW 15.2 03/13/2017 1119   LYMPHSABS 2.7 03/13/2017 1119   MONOABS 0.6 09/28/2015 0929   EOSABS 0.5 (H) 03/13/2017 1119   BASOSABS 0.0 03/13/2017 1119   Iron/TIBC/Ferritin/ %Sat No results found for: IRON, TIBC, FERRITIN, IRONPCTSAT Lipid Panel     Component Value Date/Time   CHOL 182 03/13/2017 1119   TRIG 220 (H) 03/13/2017 1119   HDL 61 03/13/2017 1119   LDLCALC 77 03/13/2017 1119   Hepatic Function Panel     Component Value Date/Time   PROT 7.9 03/13/2017 1119   ALBUMIN 4.7 03/13/2017 1119   AST 23 03/13/2017 1119   ALT 26 03/13/2017 1119   ALKPHOS 66 03/13/2017 1119   BILITOT 0.3 03/13/2017 1119      Component Value Date/Time   TSH 1.720 03/13/2017 1119    ASSESSMENT AND PLAN: Prediabetes  - Plan: metFORMIN (GLUCOPHAGE) 500 MG tablet  Vitamin D deficiency - Plan: Vitamin D, Ergocalciferol, (DRISDOL) 50000 units CAPS capsule  Class 1 obesity with serious comorbidity and body mass index (BMI) of 31.0 to 31.9 in adult, unspecified obesity type  PLAN:  Pre-Diabetes Lauren Cardenas will continue to work on weight loss, exercise, and decreasing simple carbohydrates in her diet to help decrease the risk of diabetes. We dicussed metformin including benefits and risks. She was informed that eating too many simple carbohydrates or too many calories at one sitting increases the likelihood of GI side effects. Lauren Cardenas agrees to continue taking metformin 500 mg q AM #30 and we will refill for 1 month. Lauren Cardenas agrees to follow up with our clinic in 3 weeks as directed to monitor her progress.  Vitamin D Deficiency Lauren Cardenas was informed that low vitamin D levels contributes to fatigue and are associated with obesity, breast, and colon cancer. Lauren Cardenas agrees to continue taking prescription Vit D @50 ,000 IU every week #4 and we will refill for 1 month. She will follow up for routine testing of vitamin D, at least 2-3 times per year. She was informed of the risk of over-replacement of vitamin D and agrees to not increase her dose unless he discusses this with us first. Lauren Cardenas agrees to follow up with our clinic in 3 weeks.  Obesity Lauren Cardenas is currently in the action stage of change. As such, her goal is to continue with weight loss efforts She has agreed to follow the Category 3 plan Lauren Cardenas has been instructed to work up to a goal of 150 minutes of combined cardio and strengthening exercise per week for weight loss and overall health benefits. We discussed the following Behavioral Modification Strategies today: increasing lean protein intake and decreasing simple carbohydrates    Lauren Cardenas has agreed to follow up with our clinic in 3 weeks. She was informed of the importance of frequent follow up visits to  maximize her success with intensive lifestyle modifications for her multiple health conditions.  Lauren Cardenas, Lauren Cardenas, am acting as transcriptionist for Lauren Quincearen Tayia Stonesifer, MD  Lauren Cardenas have reviewed the above documentation for accuracy and completeness, and Lauren Cardenas agree with  the above. -Lauren Quince, MD      Today's visit was # 4 out of 22.  Starting weight: 181 lbs Starting date: 03/13/17 Today's weight : 172 lbs  Today's date: 04/26/2017 Total lbs lost to date: 9 (Patients must lose 7 lbs in the first 6 months to continue with counseling)   ASK: We discussed the diagnosis of obesity with Lauren Cardenas today and Lauren Cardenas agreed to give [place] permission to discuss obesity behavioral modification therapy today.  ASSESS: Lauren Cardenas has the diagnosis of obesity and her BMI today is 31.45 Lauren Cardenas is in the action stage of change   ADVISE: Lauren Cardenas was educated on the multiple health risks of obesity as well as the benefit of weight loss to improve her health. She was advised of the need for long term treatment and the importance of lifestyle modifications.  AGREE: Multiple dietary modification options and treatment options were discussed and  Lauren Cardenas agreed to follow the Category 3 plan We discussed the following Behavioral Modification Strategies today: increasing lean protein intake and decreasing simple carbohydrates

## 2017-05-08 ENCOUNTER — Ambulatory Visit: Payer: Medicaid Other | Admitting: Neurology

## 2017-05-08 ENCOUNTER — Encounter: Payer: Self-pay | Admitting: Neurology

## 2017-05-08 VITALS — BP 120/77 | HR 74 | Ht 62.0 in | Wt 174.0 lb

## 2017-05-08 DIAGNOSIS — G43009 Migraine without aura, not intractable, without status migrainosus: Secondary | ICD-10-CM

## 2017-05-08 MED ORDER — PROMETHAZINE HCL 25 MG PO TABS: 25.0000 mg | ORAL_TABLET | ORAL | 11 refills | Status: DC | PRN

## 2017-05-08 NOTE — Progress Notes (Signed)
[facility]    Provider:  Dr Lauren Cardenas Referring Provider: Bennie PieriniMartin, Cardenas, * Primary Care Physician:  Lauren GammaBradshaw, Samuel L, MD  CC: Migraines  Interval history 05/08/2017:  Patient is here for follow-up of migraines. She had 15 headache days a month. 8 are migrainous. at last appointment, discussed options, increasing meds, botox for migraines. [facility], she has lost 9 pounds, She has done wonderful. She was started on metformin and vitamin D. Propranolol helping, only Having 2-3 migraines a month, Tylenol helps. She takes phenergan as well.   Medications tried: propranolol,Topiramate (contraindicated due to kidney stones).   Interval history 05/03/2016: MRI of the brain was normal, opening pressure was 21 so was not started on Topamax or Diamox due to past medical history of kidney stones. The propranolol is helping, headaches cut in half. They also don't last as long. She is on 20mg  twice a day and no side effects. She only has 10 a month now (from 20-25) and they are less severe. Now the headaches are left-sided, throbbing, some light sensitivity, no nausea. They last a few hours and are not severe. Can be a 6/10. Sound bother her too.   ZOX:WRUEAVWHPI:Lauren D Breweris a 31 y.o.femalehere as a referral from Dr. Dr. Despina AriasYen Le ODfor headaches. Past medical history of kidney stones. Patient has headaches that started 3-4 months ago and she has headaches 20-25 days a month. Pressure around the head. She has ringing in the ears and blurry vision and has to refocus. The headaches last up to 2-3 hours multiple times a day. They can be 6/10 on average. No light or sound sensitivity. Tylenol may help a little. Hydrocodone may help. She has had multiple kidney stones and she has had surgery to remove them. No medication overuse headaches. No inciting events to the headaches, no head trauma no other inciting events. No other modifying factors  or associated symptoms. No weight gain. No hx of Mirena IUD, Tetracycline or accutane. No previous illnesses to the headaches, head trauma, new medication. The headaches are worse as the day progresses. Bending ober cleaning makes it worse. No FHX of migraines.   Reviewed notes, labs and imaging from outside physicians, which showed:  Patient was seen at [facility]Dr. Despina AriasYen Le Cardenas thus corrected visual acuities were 20/20 in the right eye and 20/20 in the left eye at distance and near. Extraocular movements were full and unrestricted. Pupils were equally round and reactive to light with no APD. 15 mmHg in the right eye and left eye. Slit lamp findings were unremarkable. Dilated fundus examination showed mild optic nerve head edema bilaterally. No venous pulses noted in both eyes. No findings of optic nerve head pallor or optic nerve hemorrhages. 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 The macula was clear. Peripheral redness of both eyes were normal. Patient has mild optic nerve edema both eyes. Personally reviewed the Humphrey visual field data.  CT of the head 01/07/2002 (report only):  FINDINGS CLINICAL DATA: HIT BACK OF HEAD AND SYNCOPE. CT HEAD WITHOUT CONTRAST 01/04/02 AT 1530 HOURS COMPARISON NONE. FINDINGS: THE BRAIN PARENCHYMA, VENTRICULAR SYSTEM AND EXTRAAXIAL SPACE ARE WITHIN NORMAL LIMITS. THERE IS NO MASS EFFECT, MIDLINE SHIFT OR HEMORRHAGE. THERE IS NEAR TOTAL OPACIFICATION OF THE LEFT MAXILLARY SINUS WITH MUCUS MATERIAL AS WELL AS FLUID. A SMALL AMOUNT  OF MUCOSAL THICKENING IS NOTED IN THE POSTERIOR ETHMOID SINUSES. THE MASTOID AIR CELLS ARE CLEAR. IMPRESSION 1. NEAR TOTAL OPACIFICATION OF THE LEFT MAXILLARY SINUS WITH FLUID. ACUTE SINUSITIS IS A CONSIDERATION. 2. OTHERWISE NO EVIDENCE OF ACUTE INTRACRANIAL PATHOLOGY.  Review of  Systems: Patient complains of symptoms per HPI as well as the following symptoms: Eye itching, ringing in ears, cough, snoring.Pertinent negatives per HPI. All others negative.     Social History   Socioeconomic History  . Marital status: Married    Spouse name: Lauren Cardenas  . Number of children: 1  . Years of education: 4712  . Highest education level: Not on file  Social Needs  . Financial resource strain: Not on file  . Food insecurity - worry: Not on file  . Food insecurity - inability: Not on file  . Transportation needs - medical: Not on file  . Transportation needs - non-medical: Not on file  Occupational History  . Occupation: [employer]  Tobacco Use  . Smoking status: Never Smoker  . Smokeless tobacco: Never Used  Substance and Sexual Activity  . Alcohol use: No  . Drug use: No  . Sexual activity: Yes  Other Topics Concern  . Not on file  Social History Narrative   Lives with husband and daughter and in-laws   Caffeine use: 100-150mg /week   She drinks about 3 cups of diet caffeine soda per week   [facility]    Family History  Problem Relation Age of Onset  . Hypertension Mother   . Obesity Mother   . Diabetes Father   . Kidney disease Father   . Sudden death Father     Past Medical History:  Diagnosis Date  . Back pain   . Chronic migraine   . Constipation   . Dysuria   . GERD (gastroesophageal reflux disease)   . History of acute pyelonephritis    09-27-2015  . History of kidney stones   . HTN (hypertension)   . Interstitial cystitis   . Neck pain   . Nephrolithiasis    bilateral  non-obstructive per ct 09-27-2015  . OAB (overactive bladder)   . Obesity   . Right ureteral stone   . Shoulder pain   . Urgency of urination   . Vesicoureteral reflux     Past Surgical History:  Procedure Laterality Date  . WISDOM TOOTH EXTRACTION  03/2014    Current Outpatient Medications  Medication  Sig Dispense Refill  . acetaminophen (TYLENOL) 500 MG tablet Take 1,000 mg by mouth every 6 (six) hours as needed.    . cetirizine (ZYRTEC ALLERGY) 10 MG tablet Take 10 mg by mouth daily.    Marland Kitchen. CRANBERRY PO Take 500 mg by mouth daily.    . cycloSPORINE (RESTASIS) 0.05 % ophthalmic emulsion Place 1 drop into both eyes 2 (two) times daily.    . fluticasone (FLONASE) 50 MCG/ACT nasal spray Place 1 spray into both nostrils as needed for allergies or rhinitis.    Marland Kitchen. HYDROcodone-acetaminophen (NORCO) 7.5-325 MG tablet Take 1 tablet by mouth every 6 (six) hours as needed for moderate pain.    Marland Kitchen. ibuprofen (ADVIL,MOTRIN) 200 MG tablet Take 400-800 mg by mouth 3 (three) times daily.     Marland Kitchen. ketorolac (TORADOL) 10 MG tablet Take 10 mg by mouth every 6 (six) hours as needed.    . metFORMIN (GLUCOPHAGE) 500 MG tablet Take 1 tablet (500 mg total) by mouth  daily with breakfast. 30 tablet 0  . Multiple Vitamins-Minerals (CENTRUM MULTIGUMMIES) CHEW Chew 1 tablet by mouth daily.    . Olopatadine HCl (PAZEO) 0.7 % SOLN Apply 1 drop to eye daily.    . promethazine (PHENERGAN) 25 MG tablet Take 1 tablet (25 mg total) as needed by mouth for nausea or vomiting. 30 tablet 11  . propranolol ER (INDERAL LA) 120 MG 24 hr capsule Take 1 capsule (120 mg total) at bedtime by mouth. 90 capsule 4  . rizatriptan (MAXALT-MLT) 10 MG disintegrating tablet Take 1 tablet (10 mg total) 3 (three) times daily as needed by mouth for migraine. 9 tablet 11  . tamsulosin (FLOMAX) 0.4 MG CAPS capsule Take 0.4 mg by mouth as needed.    . Vitamin D, Ergocalciferol, (DRISDOL) 50000 units CAPS capsule Take 1 capsule (50,000 Units total) by mouth every 7 (seven) days. 4 capsule 0   No current facility-administered medications for this visit.     Allergies as of 05/08/2017 - Review Complete 05/08/2017  Allergen Reaction Noted  . Amoxicillin Hives 06/12/2014    Vitals: BP 120/77 (BP Location: Right Arm, Patient Position: Sitting)   Pulse 74    Ht 5\' 2"  (1.575 m)   Wt 174 lb (78.9 kg)   BMI 31.83 kg/m  Last Weight:  Wt Readings from Last 1 Encounters:  05/08/17 174 lb (78.9 kg)   Last Height:   Ht Readings from Last 1 Encounters:  05/08/17 5\' 2"  (1.575 m)    Physical exam: Exam: Gen: NAD, conversant, well nourised, obese, well groomed                     CV: RRR, no MRG. No Carotid Bruits. No peripheral edema, warm, nontender Eyes: Conjunctivae clear without exudates or hemorrhage  Neuro: Detailed Neurologic Exam  Speech:    Speech is normal; fluent and spontaneous with normal comprehension.  Cognition:    The patient is oriented to person, place, and time;     recent and remote memory intact;     language fluent;     normal attention, concentration,     fund of knowledge Cranial Nerves:    The pupils are equal, round, and reactive to light. The fundi are normal and spontaneous venous pulsations are present. Visual fields are full to finger confrontation. Extraocular movements are intact. Trigeminal sensation is intact and the muscles of mastication are normal. The face is symmetric. The palate elevates in the midline. Hearing intact. Voice is normal. Shoulder shrug is normal. The tongue has normal motion without fasciculations.   Coordination:    Normal finger to nose and heel to shin. Normal rapid alternating movements.   Gait:    Heel-toe and tandem gait are normal.   Motor Observation:    No asymmetry, no atrophy, and no involuntary movements noted. Tone:    Normal muscle tone.    Posture:    Posture is normal. normal erect    Strength:    Strength is V/V in the upper and lower limbs.      Sensation: intact to LT     Reflex Exam:  DTR's:    Deep tendon reflexes in the upper and lower extremities are normal bilaterally.   Toes:    The toes are downgoing bilaterally.   Clonus:    Clonus is absent.     Assessment/Plan:31 year old with optic nerve head edema, new onset headaches, vision and  hearing changes first seen in 2017. MRI  of the brain was normal, opening pressure was 21 so was not started on Topamax or Diamox due to past medical history of kidney stones. The propranolol is helping, headaches cut in half. They also don't last as long. [facility]   - Recommend continued massage and PT - Physical therapy for cervicalgia, musculoskeletal neck pain, chronic neck pain and worsening migraines due to neck pain, improved - Continue propranolol, migraines improved - [facility], doing well - maxalt for acute management, OTC analgesis works well for her  Naomie Dean, MD  [facility] [address] [place], [place] [address]  Phone [phone] Fax [phone]  A total of 15 minutes was spent face-to-face with this patient. Over half this time was spent on counseling patient on the migraine diagnosis and different diagnostic and therapeutic options available.

## 2017-05-09 DIAGNOSIS — G43009 Migraine without aura, not intractable, without status migrainosus: Secondary | ICD-10-CM | POA: Insufficient documentation

## 2017-05-09 MED ORDER — PROPRANOLOL HCL ER 120 MG PO CP24: 120.0000 mg | ORAL_CAPSULE | Freq: Every day | ORAL | 4 refills | Status: DC

## 2017-05-09 MED ORDER — RIZATRIPTAN BENZOATE 10 MG PO TBDP: 10.0000 mg | ORAL_TABLET | Freq: Three times a day (TID) | ORAL | 11 refills | Status: DC | PRN

## 2017-05-10 ENCOUNTER — Ambulatory Visit: Payer: Medicaid Other | Admitting: Family Medicine

## 2017-05-10 ENCOUNTER — Encounter: Payer: Self-pay | Admitting: Family Medicine

## 2017-05-10 ENCOUNTER — Other Ambulatory Visit: Payer: Self-pay | Admitting: Family Medicine

## 2017-05-10 VITALS — BP 111/78 | HR 76 | Temp 98.7°F | Ht 62.0 in | Wt 173.0 lb

## 2017-05-10 DIAGNOSIS — J069 Acute upper respiratory infection, unspecified: Secondary | ICD-10-CM

## 2017-05-10 MED ORDER — FLUTICASONE PROPIONATE 50 MCG/ACT NA SUSP: 2.0000 | NASAL | 0 refills | Status: DC | PRN

## 2017-05-10 MED ORDER — BENZONATATE 200 MG PO CAPS: 200.0000 mg | ORAL_CAPSULE | Freq: Two times a day (BID) | ORAL | 0 refills | Status: DC | PRN

## 2017-05-10 MED ORDER — AZITHROMYCIN 250 MG PO TABS: ORAL_TABLET | ORAL | 0 refills | Status: DC

## 2017-05-10 MED ORDER — CETIRIZINE HCL 10 MG PO TABS: 10.0000 mg | ORAL_TABLET | Freq: Every day | ORAL | 0 refills | Status: DC

## 2017-05-10 NOTE — Patient Instructions (Signed)
This may be viral.  However, given the color of the phlegm that you are describing, I cannot rule out that this is not bacterial.  For this reason, I have empirically prescribed an antibiotic called azithromycin.  He will take 2 tablets today.  Then take 1 tablet daily until gone.  I have also [facility] for you to take twice a day as needed for cough.  These are not sedating.  I do recommend plain Mucinex for decongestion of your chest.  - Get plenty of rest and drink plenty of fluids. - Try to breathe moist air. Use a cold mist humidifier. - Consume warm fluids (soup or tea) to provide relief for a stuffy nose and to loosen phlegm. - For nasal stuffiness, try saline nasal spray or a Neti Pot. Afrin nasal spray can also be used but this product should not be used longer than 3 days or it will cause rebound nasal stuffiness (worsening nasal congestion). - For sore throat pain relief: suck on throat lozenges, hard candy or popsicles; gargle with warm salt water (1/4 tsp. salt per 8 oz. of water); and eat soft, bland foods. - Eat a well-balanced diet. If you cannot, ensure you are getting enough nutrients by taking a daily multivitamin. - Avoid dairy products, as they can thicken phlegm. - Avoid alcohol, as it impairs your body's immune system.  CONTACT YOUR DOCTOR IF YOU EXPERIENCE ANY OF THE FOLLOWING: - High fever - Ear pain - Sinus-type headache - Unusually severe cold symptoms - Cough that gets worse while other cold symptoms improve - Flare up of any chronic lung problem, such as asthma - Your symptoms persist longer than 2 weeks

## 2017-05-10 NOTE — Progress Notes (Signed)
Subjective: CC: cough PCP: Elenora GammaBradshaw, Samuel L, MD NWG:NFAOZHYHPI:Lauren Cardenas is a 31 y.o. female presenting to clinic today for:  1. Cold symptoms  Patient reports a productive cough, sinus drainage and chest congestion that started 1.5 weeks ago.  She describes the sputum as greenish brown.  Denies hemoptysis, headache, SOB, dizziness, rash, nausea, vomiting, diarrhea, chills, myalgia, sick contacts, recent travel.  Patient has used Advil Cold and Sinus with little relief of symptoms.  Denies history of COPD or asthma.  No tobacco use/ exposure.   Allergies  Allergen Reactions  . Amoxicillin Hives   Past Medical History:  Diagnosis Date  . Back pain   . Chronic migraine   . Constipation   . Dysuria   . GERD (gastroesophageal reflux disease)   . History of acute pyelonephritis    09-27-2015  . History of kidney stones   . HTN (hypertension)   . Interstitial cystitis   . Neck pain   . Nephrolithiasis    bilateral  non-obstructive per ct 09-27-2015  . OAB (overactive bladder)   . Obesity   . Right ureteral stone   . Shoulder pain   . Urgency of urination   . Vesicoureteral reflux    Family History  Problem Relation Age of Onset  . Hypertension Mother   . Obesity Mother   . Diabetes Father   . Kidney disease Father   . Sudden death Father     Current Outpatient Medications:  .  acetaminophen (TYLENOL) 500 MG tablet, Take 1,000 mg by mouth every 6 (six) hours as needed., Disp: , Rfl:  .  cetirizine (ZYRTEC ALLERGY) 10 MG tablet, Take 10 mg by mouth daily., Disp: , Rfl:  .  CRANBERRY PO, Take 500 mg by mouth daily., Disp: , Rfl:  .  cycloSPORINE (RESTASIS) 0.05 % ophthalmic emulsion, Place 1 drop into both eyes 2 (two) times daily., Disp: , Rfl:  .  fluticasone (FLONASE) 50 MCG/ACT nasal spray, Place 1 spray into both nostrils as needed for allergies or rhinitis., Disp: , Rfl:  .  HYDROcodone-acetaminophen (NORCO) 7.5-325 MG tablet, Take 1 tablet by mouth every 6 (six)  hours as needed for moderate pain., Disp: , Rfl:  .  ibuprofen (ADVIL,MOTRIN) 200 MG tablet, Take 400-800 mg by mouth 3 (three) times daily. , Disp: , Rfl:  .  ketorolac (TORADOL) 10 MG tablet, Take 10 mg by mouth every 6 (six) hours as needed., Disp: , Rfl:  .  metFORMIN (GLUCOPHAGE) 500 MG tablet, Take 1 tablet (500 mg total) by mouth daily with breakfast., Disp: 30 tablet, Rfl: 0 .  Multiple Vitamins-Minerals (CENTRUM MULTIGUMMIES) CHEW, Chew 1 tablet by mouth daily., Disp: , Rfl:  .  Olopatadine HCl (PAZEO) 0.7 % SOLN, Apply 1 drop to eye daily., Disp: , Rfl:  .  promethazine (PHENERGAN) 25 MG tablet, Take 1 tablet (25 mg total) as needed by mouth for nausea or vomiting., Disp: 30 tablet, Rfl: 11 .  propranolol ER (INDERAL LA) 120 MG 24 hr capsule, Take 1 capsule (120 mg total) at bedtime by mouth., Disp: 90 capsule, Rfl: 4 .  rizatriptan (MAXALT-MLT) 10 MG disintegrating tablet, Take 1 tablet (10 mg total) 3 (three) times daily as needed by mouth for migraine., Disp: 9 tablet, Rfl: 11 .  tamsulosin (FLOMAX) 0.4 MG CAPS capsule, Take 0.4 mg by mouth as needed., Disp: , Rfl:  .  Vitamin D, Ergocalciferol, (DRISDOL) 50000 units CAPS capsule, Take 1 capsule (50,000 Units total) by mouth every  7 (seven) days., Disp: 4 capsule, Rfl: 0  Social Hx: non smoker.  ROS: Per HPI  Objective: Office vital signs reviewed. BP 111/78   Pulse 76   Temp 98.7 F (37.1 C) (Oral)   Ht 5\' 2"  (1.575 m)   Wt 173 lb (78.5 kg)   BMI 31.64 kg/m   Physical Examination:  General: Awake, alert, well nourished, nontoxic, No acute distress HEENT: Normal    Neck: No masses palpated.  Mildly enlarged left anterior cervical lymph node.  This is nontender to palpation.    Ears: Tympanic membranes intact, normal light reflex, no erythema, no bulging    Eyes: PERRLA, extraocular membranes intact, sclera white, no ocular discharge    Nose: nasal turbinates moist, clear nasal discharge    Throat: moist mucus membranes,  mild oropharyngeal erythema, no exudate.  Tonsils not enlarged.  Airway is patent Cardio: regular rate and rhythm, S1S2 heard, no murmurs appreciated Pulm: clear to auscultation bilaterally, no wheezes, rhonchi or rales; normal work of breathing on room air  Assessment/ Plan: 31 y.o. female   1. URI with cough and congestion May be viral.  Patient is well-appearing.  She is afebrile with normal vital signs.  Cardiopulmonary exam unremarkable.  She has a mildly enlarged left anterior cervical lymph node and mild oropharyngeal erythema.  Otherwise, exam unremarkable.  Given her brown sputum production, cannot rule out a streptococcal pneumonia.  For this reason, azithromycin was prescribed.  Tessalon Perles also prescribed twice daily as needed cough.  Home care instructions were reviewed and handout was provided. Strict return precautions and reasons for emergent evaluation in the emergency department review with patient.  They voiced understanding and will follow-up as needed.   No orders of the defined types were placed in this encounter.  Meds ordered this encounter  Medications  . benzonatate (TESSALON) 200 MG capsule    Sig: Take 1 capsule (200 mg total) 2 (two) times daily as needed by mouth for cough.    Dispense:  20 capsule    Refill:  0  . azithromycin (ZITHROMAX Z-PAK) 250 MG tablet    Sig: Take 2 tablets day 1, then take 1 tablet daily days 2 through 5.    Dispense:  6 tablet    Refill:  0     Ashly Hulen SkainsM Gottschalk, DO [facility] [phone]

## 2017-05-15 ENCOUNTER — Ambulatory Visit (INDEPENDENT_AMBULATORY_CARE_PROVIDER_SITE_OTHER): Payer: Medicaid Other | Admitting: Family Medicine

## 2017-05-15 VITALS — BP 120/85 | HR 72 | Temp 98.3°F | Ht 62.0 in | Wt 171.0 lb

## 2017-05-15 DIAGNOSIS — Z6831 Body mass index (BMI) 31.0-31.9, adult: Secondary | ICD-10-CM | POA: Diagnosis not present

## 2017-05-15 DIAGNOSIS — E559 Vitamin D deficiency, unspecified: Secondary | ICD-10-CM

## 2017-05-15 DIAGNOSIS — E669 Obesity, unspecified: Secondary | ICD-10-CM

## 2017-05-15 MED ORDER — VITAMIN D (ERGOCALCIFEROL) 1.25 MG (50000 UNIT) PO CAPS: 50000.0000 [IU] | ORAL_CAPSULE | ORAL | 0 refills | Status: DC

## 2017-05-15 NOTE — Progress Notes (Signed)
Office: [phone]  /  Fax: [phone]   HPI:   Chief Complaint: OBESITY Lauren Cardenas is here to discuss her progress with her obesity treatment plan. She is on the Category 3 plan and is following her eating plan approximately 75 % of the time. She states she is walking 22 to 24 minutes 2 times per week. Lauren Cardenas continues to do well with weight loss but she is struggling to eat all her dinner and still has problems with family sabotage and temptations. Her weight is 171 lb (77.6 kg) today and has had a weight loss of 2 pounds over a period of 3 weeks since her last visit. She has lost 10 lbs since starting treatment with Lauren Cardenas.  Lauren Cardenas Lauren Cardenas has a diagnosis of Lauren Cardenas. She is currently stable on vit D but is not yet at goal. She denies nausea, vomiting or muscle weakness.  ALLERGIES: Allergies  Allergen Reactions  . Amoxicillin Hives    MEDICATIONS: Current Outpatient Medications on File Prior to Visit  Medication Sig Dispense Refill  . acetaminophen (TYLENOL) 500 MG tablet Take 1,000 mg by mouth every 6 (six) hours as needed.    . benzonatate (TESSALON) 200 MG capsule Take 1 capsule (200 mg total) 2 (two) times daily as needed by mouth for cough. 20 capsule 0  . cetirizine (ZYRTEC ALLERGY) 10 MG tablet Take 1 tablet (10 mg total) daily by mouth. 90 tablet 0  . CRANBERRY PO Take 500 mg by mouth daily.    . cycloSPORINE (RESTASIS) 0.05 % ophthalmic emulsion Place 1 drop into both eyes 2 (two) times daily.    . fluticasone (FLONASE) 50 MCG/ACT nasal spray Place 2 sprays as needed into both nostrils for allergies or rhinitis. 16 g 0  . HYDROcodone-acetaminophen (NORCO) 7.5-325 MG tablet Take 1 tablet by mouth every 6 (six) hours as needed for moderate pain.    Marland Kitchen. ibuprofen (ADVIL,MOTRIN) 200 MG tablet Take 400-800 mg by mouth 3 (three) times daily.     Marland Kitchen. ketorolac (TORADOL) 10 MG tablet Take 10 mg by mouth every 6 (six) hours as needed.    . metFORMIN  (GLUCOPHAGE) 500 MG tablet Take 1 tablet (500 mg total) by mouth daily with breakfast. 30 tablet 0  . Multiple Vitamins-Minerals (CENTRUM MULTIGUMMIES) CHEW Chew 1 tablet by mouth daily.    . Olopatadine HCl (PAZEO) 0.7 % SOLN Apply 1 drop to eye daily.    . promethazine (PHENERGAN) 25 MG tablet Take 1 tablet (25 mg total) as needed by mouth for nausea or vomiting. 30 tablet 11  . propranolol ER (INDERAL LA) 120 MG 24 hr capsule Take 1 capsule (120 mg total) at bedtime by mouth. 90 capsule 4  . rizatriptan (MAXALT-MLT) 10 MG disintegrating tablet Take 1 tablet (10 mg total) 3 (three) times daily as needed by mouth for migraine. 9 tablet 11  . tamsulosin (FLOMAX) 0.4 MG CAPS capsule Take 0.4 mg by mouth as needed.    . Lauren D, Ergocalciferol, (DRISDOL) 50000 units CAPS capsule Take 1 capsule (50,000 Units total) by mouth every 7 (seven) days. 4 capsule 0   No current facility-administered medications on file prior to visit.     PAST MEDICAL HISTORY: Past Medical History:  Diagnosis Date  . Back pain   . Chronic migraine   . Constipation   . Dysuria   . GERD (gastroesophageal reflux disease)   . History of acute pyelonephritis    09-27-2015  . History of kidney stones   .  HTN (hypertension)   . Interstitial cystitis   . Neck pain   . Nephrolithiasis    bilateral  non-obstructive per ct 09-27-2015  . OAB (overactive bladder)   . Obesity   . Right ureteral stone   . Shoulder pain   . Urgency of urination   . Vesicoureteral reflux     PAST SURGICAL HISTORY: Past Surgical History:  Procedure Laterality Date  . CYSTOSCOPY WITH LEFT RETROGRADE PYELOGRAM, URETEROSCOPY AND STONE EXTRACTION  DOUBLE J STENT PLACEMENT Left 09/09/2014   [facility]skridge[facility] Matthew[facility]  . CYSTOSCOPY WITH RETROGRADE PYELOGRAM/URETERAL STENT PLACEMENT Right 09/27/2015   Performed by Hildred LaserBudzyn, Brian James, MD at [facility]  . HOLMIUM LASER APPLICATION Right 10/16/2015   [facility]skridge[facility] Matthew[facility]  [facility]  . HOLMIUM LASER APPLICATION Left 09/09/2014   [facility]Eskridge[facility]  . RIGHT URETEROSCOPY/HOLMIUM LASER/STENT PLACEMENT Right 10/16/2015   [facility]skridge[facility]  . STONE EXTRACTION WITH BASKET Right 10/16/2015   [facility]formed [facility]Eskridge[facility] Matthew[facility]  . WISDOM TOOTH EXTRACTION  03/2014    SOCIAL HISTORY: Social History   Tobacco Use  . Smoking status: Never Smoker  . Smokeless tobacco: Never Used  Substance Use Topics  . Alcohol use: No  . Drug use: No    FAMILY HISTORY: Family History  Problem Relation Age of Onset  . Hypertension Mother   . Obesity Mother   . Diabetes Father   . Kidney disease Father   . Sudden death Father     ROS: Review of Systems  Constitutional: Positive for weight loss.  Gastrointestinal: Negative for nausea and vomiting.  Musculoskeletal:       Negative muscle weakness    PHYSICAL EXAM: Blood pressure 120/85, pulse 72, temperature 98.3 F (36.8 C), temperature source Oral, height 5\' 2"  (1.575 m), weight 171 lb (77.6 kg), last menstrual period 04/20/2017, SpO2 99 %. Body mass index is 31.28 kg/m. Physical Exam  Constitutional: She is oriented to person, place, and time. She appears well-developed and well-nourished.  Cardiovascular: Normal rate.  Pulmonary/Chest: Effort normal.  Musculoskeletal: Normal range of motion.  Neurological: She is oriented to person, place, and time.  Skin: Skin is warm and dry.  Psychiatric: She has a normal mood and affect. Her behavior is normal.  Vitals reviewed.   RECENT LABS AND TESTS: BMET    Component Value Date/Time   NA 138 03/13/2017 1119   K 4.6 03/13/2017 1119   CL 101 03/13/2017 1119   CO2 21 03/13/2017 1119   GLUCOSE 78 03/13/2017 1119   GLUCOSE 119 (H) 09/29/2015 0514   BUN 9 03/13/2017 1119   CREATININE 0.78 03/13/2017 1119   CALCIUM 9.5 03/13/2017 1119    GFRNONAA 101 03/13/2017 1119   GFRAA 116 03/13/2017 1119   Lab Results  Component Value Date   HGBA1C 6.1 (H) 03/13/2017   Lab Results  Component Value Date   INSULIN 44.5 (H) 03/13/2017   CBC    Component Value Date/Time   WBC 12.9 (H) 03/13/2017 1119   WBC 14.9 (H) 09/29/2015 0514   RBC 5.48 (H) 03/13/2017 1119   RBC 4.34 09/29/2015 0514   HGB 14.2 03/13/2017 1119   HCT 43.9 03/13/2017 1119   PLT 414 (H) 10/05/2015 0909   MCV 80 03/13/2017 1119   MCH 25.9 (L) 03/13/2017 1119   MCH 25.8 (L) 09/29/2015 [id number]  MCHC 32.3 03/13/2017 1119   MCHC 32.7 09/29/2015 0514   RDW 15.2 03/13/2017 1119   LYMPHSABS 2.7 03/13/2017 1119   MONOABS 0.6 09/28/2015 0929   EOSABS 0.5 (H) 03/13/2017 1119   BASOSABS 0.0 03/13/2017 1119   Iron/TIBC/Ferritin/ %Sat No results found for: IRON, TIBC, FERRITIN, IRONPCTSAT Lipid Panel     Component Value Date/Time   CHOL 182 03/13/2017 1119   TRIG 220 (H) 03/13/2017 1119   HDL 61 03/13/2017 1119   LDLCALC 77 03/13/2017 1119   Hepatic Function Panel     Component Value Date/Time   PROT 7.9 03/13/2017 1119   ALBUMIN 4.7 03/13/2017 1119   AST 23 03/13/2017 1119   ALT 26 03/13/2017 1119   ALKPHOS 66 03/13/2017 1119   BILITOT 0.3 03/13/2017 1119      Component Value Date/Time   TSH 1.720 03/13/2017 1119    ASSESSMENT AND PLAN: Lauren Cardenas - Plan: Lauren D, Ergocalciferol, (DRISDOL) 50000 units CAPS capsule  Class 1 obesity with serious comorbidity and body mass index (BMI) of 31.0 to 31.9 in adult, unspecified obesity type  PLAN:  Lauren Cardenas Lauren Cardenas was informed that low Lauren D levels contributes to fatigue and are associated with obesity, breast, and colon cancer. She agrees to continue to take prescription Vit D @50 ,000 IU every week #4 with no refills. We will check labs at next visit and will follow up for routine testing of Lauren D, at least 2-3 times per year. She was informed of the risk of  over-replacement of Lauren D and agrees to not increase her dose unless he discusses this with [place] first. Lauren Cardenas agrees to follow up with our clinic in 2 to 3 weeks.  Obesity Lauren Cardenas is currently in the action stage of change. As such, her goal is to continue with weight loss efforts She has agreed to follow the Category 3 plan Lauren Cardenas has been instructed to work up to a goal of 150 minutes of combined cardio and strengthening exercise per week for weight loss and overall health benefits. We discussed the following Behavioral Modification Strategies today: keeping healthy foods in the home, dealing with family or coworker sabotage and emotional eating strategies  Lauren Cardenas has agreed to follow up with our clinic in 2 to 3 weeks. She was informed of the importance of frequent follow up visits to maximize her success with intensive lifestyle modifications for her multiple health conditions.  I, [place] Crane, am acting as transcriptionist for Lauren Quince, MD  I have reviewed the above documentation for accuracy and completeness, and I agree with the above. -Lauren Quince, MD    OBESITY BEHAVIORAL INTERVENTION VISIT  Today's visit was # 5 out of 22.  Starting weight: 181 lbs Starting date: 03/13/17 Today's weight : 171 lbs Today's date: 05/15/2017 Total lbs lost to date: 10 (Patients must lose 7 lbs in the first 6 months to continue with counseling)   ASK: We discussed the diagnosis of obesity with Lauren Cardenas today and Lauren Cardenas agreed to give [place] permission to discuss obesity behavioral modification therapy today.  ASSESS: Lauren Cardenas has the diagnosis of obesity and her BMI today is 31.27 Lauren Cardenas is in the action stage of change   ADVISE: Lauren Cardenas was educated on the multiple health risks of obesity as well as the benefit of weight loss to improve her health. She was advised of the need for long term treatment and the importance of lifestyle modifications.  AGREE: Multiple  dietary modification options and treatment  options were discussed and  Lauren Cardenas agreed to follow the Category 3 plan We discussed the following Behavioral Modification Strategies today: keeping healthy foods in the home, dealing with family or coworker sabotage and emotional eating strategies

## 2017-05-30 ENCOUNTER — Ambulatory Visit (INDEPENDENT_AMBULATORY_CARE_PROVIDER_SITE_OTHER): Payer: Medicaid Other | Admitting: Family Medicine

## 2017-05-30 VITALS — BP 112/76 | HR 70 | Temp 97.8°F | Ht 62.0 in | Wt 172.0 lb

## 2017-05-30 DIAGNOSIS — F32A Depression, unspecified: Secondary | ICD-10-CM | POA: Insufficient documentation

## 2017-05-30 DIAGNOSIS — F329 Major depressive disorder, single episode, unspecified: Secondary | ICD-10-CM

## 2017-05-30 DIAGNOSIS — E559 Vitamin D deficiency, unspecified: Secondary | ICD-10-CM | POA: Diagnosis not present

## 2017-05-30 DIAGNOSIS — F3289 Other specified depressive episodes: Secondary | ICD-10-CM

## 2017-05-30 DIAGNOSIS — R7303 Prediabetes: Secondary | ICD-10-CM | POA: Diagnosis not present

## 2017-05-30 DIAGNOSIS — E1169 Type 2 diabetes mellitus with other specified complication: Secondary | ICD-10-CM | POA: Insufficient documentation

## 2017-05-30 DIAGNOSIS — E7849 Other hyperlipidemia: Secondary | ICD-10-CM | POA: Diagnosis not present

## 2017-05-30 DIAGNOSIS — E669 Obesity, unspecified: Secondary | ICD-10-CM

## 2017-05-30 DIAGNOSIS — Z6831 Body mass index (BMI) 31.0-31.9, adult: Secondary | ICD-10-CM

## 2017-05-30 HISTORY — DX: Depression, unspecified: F32.A

## 2017-05-30 HISTORY — DX: Major depressive disorder, single episode, unspecified: F32.9

## 2017-05-30 MED ORDER — METFORMIN HCL 500 MG PO TABS: 500.0000 mg | ORAL_TABLET | Freq: Every day | ORAL | 0 refills | Status: DC

## 2017-05-30 MED ORDER — BUPROPION HCL ER (SR) 150 MG PO TB12: 150.0000 mg | ORAL_TABLET | Freq: Every day | ORAL | 0 refills | Status: DC

## 2017-05-30 NOTE — Progress Notes (Deleted)
Depression

## 2017-05-30 NOTE — Progress Notes (Signed)
Office: [phone]  /  Fax: [phone]   HPI:   Chief Complaint: OBESITY Lauren Cardenas is here to discuss her progress with her obesity treatment plan. She is on the Category 3 plan and is following her eating plan approximately 50 % of the time. She states she is exercising 0 minutes 0 times per week. [place] was off track over Thanksgiving and she is getting bored with dinner and would like more options.  Her weight is 172 lb (78 kg) today and has gained 1 pound since her last visit. She has lost 9 lbs since starting treatment with us.  Pre-Diabetes Koda has a diagnosis of pre-diabetes based on her elevated Hgb A1c and was informed this puts her at greater risk of developing diabetes. She is stable on metformin and continues to work on diet and exercise to decrease risk of diabetes. She denies nausea or hypoglycemia.  Vitamin D deficiency Sherline has a diagnosis of vitamin D deficiency. She is stable on prescription Vit D and denies nausea, vomiting or muscle weakness.  Hyperlipidemia Kamryn has hyperlipidemia and has been trying to improve her cholesterol levels with intensive lifestyle modification including a low saturated fat diet, exercise and weight loss. She stable and denies any chest pain, claudication or myalgias.  Depression with emotional eating behaviors Bessye notes increased emotional eating and cravings and using food for comfort to the extent that it is negatively impacting her health. She often snacks when she is not hungry. Odalys sometimes feels she is out of control and then feels guilty that she made poor food choices. She has been working on behavior modification techniques to help reduce her emotional eating and has been somewhat successful. She shows no sign of suicidal or homicidal ideations.  [facility] 05/10/2017 03/13/2017 02/14/2017 08/09/2016 10/05/2015  Decreased Interest 0 1 0 0 0  Down, Depressed, Hopeless 0 1 0 0 0  PHQ - 2 Score 0 2 0  0 0  Altered sleeping - 3 - - -  Tired, decreased energy - 3 - - -  Change in appetite - 2 - - -  Feeling bad or failure about yourself  - 1 - - -  Trouble concentrating - 1 - - -  Moving slowly or fidgety/restless - 1 - - -  Suicidal thoughts - 0 - - -  PHQ-9 Score - 13 - - -  Difficult doing work/chores - Very difficult - - -   ALLERGIES: Allergies  Allergen Reactions  . Amoxicillin Hives    MEDICATIONS: Current Outpatient Medications on File Prior to Visit  Medication Sig Dispense Refill  . acetaminophen (TYLENOL) 500 MG tablet Take 1,000 mg by mouth every 6 (six) hours as needed.    . benzonatate (TESSALON) 200 MG capsule Take 1 capsule (200 mg total) 2 (two) times daily as needed by mouth for cough. 20 capsule 0  . cetirizine (ZYRTEC ALLERGY) 10 MG tablet Take 1 tablet (10 mg total) daily by mouth. 90 tablet 0  . CRANBERRY PO Take 500 mg by mouth daily.    . cycloSPORINE (RESTASIS) 0.05 % ophthalmic emulsion Place 1 drop into both eyes 2 (two) times daily.    . fluticasone (FLONASE) 50 MCG/ACT nasal spray Place 2 sprays as needed into both nostrils for allergies or rhinitis. 16 g 0  . HYDROcodone-acetaminophen (NORCO) 7.5-325 MG tablet Take 1 tablet by mouth every 6 (six) hours as needed for moderate pain.    Marland Kitchen. ibuprofen (ADVIL,MOTRIN) 200 MG tablet Take  400-800 mg by mouth 3 (three) times daily.     Marland Kitchen ketorolac (TORADOL) 10 MG tablet Take 10 mg by mouth every 6 (six) hours as needed.    Marland Kitchen levonorgestrel-ethinyl estradiol (AVIANE,ALESSE,LESSINA) 0.1-20 MG-MCG tablet Take 1 tablet by mouth daily.    . Multiple Vitamins-Minerals (CENTRUM MULTIGUMMIES) CHEW Chew 1 tablet by mouth daily.    . Olopatadine HCl (PAZEO) 0.7 % SOLN Apply 1 drop to eye daily.    . promethazine (PHENERGAN) 25 MG tablet Take 1 tablet (25 mg total) as needed by mouth for nausea or vomiting. 30 tablet 11  . propranolol ER (INDERAL LA) 120 MG 24 hr capsule Take 1 capsule (120 mg total) at bedtime by mouth. 90  capsule 4  . rizatriptan (MAXALT-MLT) 10 MG disintegrating tablet Take 1 tablet (10 mg total) 3 (three) times daily as needed by mouth for migraine. 9 tablet 11  . tamsulosin (FLOMAX) 0.4 MG CAPS capsule Take 0.4 mg by mouth as needed.    . Vitamin D, Ergocalciferol, (DRISDOL) 50000 units CAPS capsule Take 1 capsule (50,000 Units total) every 7 (seven) days by mouth. 4 capsule 0   No current facility-administered medications on file prior to visit.     PAST MEDICAL HISTORY: Past Medical History:  Diagnosis Date  . Back pain   . Chronic migraine   . Constipation   . Dysuria   . GERD (gastroesophageal reflux disease)   . History of acute pyelonephritis    09-27-2015  . History of kidney stones   . HTN (hypertension)   . Interstitial cystitis   . Neck pain   . Nephrolithiasis    bilateral  non-obstructive per ct 09-27-2015  . OAB (overactive bladder)   . Obesity   . Right ureteral stone   . Shoulder pain   . Urgency of urination   . Vesicoureteral reflux     PAST SURGICAL HISTORY: Past Surgical History:  Procedure Laterality Date  . CYSTOSCOPY W/ URETERAL STENT PLACEMENT Right 09/27/2015   Procedure: CYSTOSCOPY WITH RETROGRADE PYELOGRAM/URETERAL STENT PLACEMENT;  Surgeon: Hildred Laser, MD;  Location: [facility];  Service: Urology;  Laterality: Right;  . CYSTOSCOPY WITH RETROGRADE PYELOGRAM, URETEROSCOPY AND STENT PLACEMENT Left 09/09/2014   Procedure: CYSTOSCOPY WITH LEFT RETROGRADE PYELOGRAM, URETEROSCOPY AND STONE EXTRACTION  DOUBLE J STENT PLACEMENT;  Surgeon: Jerilee Field, MD;  Location: [facility];  Service: Urology;  Laterality: Left;  . CYSTOSCOPY/URETEROSCOPY/HOLMIUM LASER/STENT PLACEMENT Right 10/16/2015   Procedure: RIGHT URETEROSCOPY/HOLMIUM LASER/STENT PLACEMENT;  Surgeon: Jerilee Field, MD;  Location: [facility];  Service: Urology;  Laterality: Right;  . HOLMIUM LASER APPLICATION Left 09/09/2014   Procedure: HOLMIUM LASER APPLICATION;  Surgeon:  Jerilee Field, MD;  Location: [facility];  Service: Urology;  Laterality: Left;  . HOLMIUM LASER APPLICATION Right 10/16/2015   Procedure: HOLMIUM LASER APPLICATION;  Surgeon: Jerilee Field, MD;  Location: [facility];  Service: Urology;  Laterality: Right;  . STONE EXTRACTION WITH BASKET Right 10/16/2015   Procedure: STONE EXTRACTION WITH BASKET;  Surgeon: Jerilee Field, MD;  Location: [facility];  Service: Urology;  Laterality: Right;  . WISDOM TOOTH EXTRACTION  03/2014    SOCIAL HISTORY: Social History   Tobacco Use  . Smoking status: Never Smoker  . Smokeless tobacco: Never Used  Substance Use Topics  . Alcohol use: No  . Drug use: No    FAMILY HISTORY: Family History  Problem Relation Age of Onset  . Hypertension Mother   . Obesity Mother   .  Diabetes Father   . Kidney disease Father   . Sudden death Father     ROS: Review of Systems  Constitutional: Negative for weight loss.  Cardiovascular: Negative for chest pain and claudication.  Gastrointestinal: Negative for nausea and vomiting.  Musculoskeletal: Negative for myalgias.       Negative muscle weakness   Endo/Heme/Allergies:       Negative hypoglycemia  Psychiatric/Behavioral: Positive for depression. Negative for suicidal ideas.    PHYSICAL EXAM: Blood pressure 112/76, pulse 70, temperature 97.8 F (36.6 C), temperature source Oral, height 5\' 2"  (1.575 m), weight 172 lb (78 kg), last menstrual period 05/16/2017, SpO2 99 %. Body mass index is 31.46 kg/m. Physical Exam  Constitutional: She is oriented to person, place, and time. She appears well-developed and well-nourished.  Cardiovascular: Normal rate.  Pulmonary/Chest: Effort normal.  Musculoskeletal: Normal range of motion.  Neurological: She is oriented to person, place, and time.  Skin: Skin is warm and dry.  Psychiatric: She has a normal mood and affect.  Vitals reviewed.   RECENT LABS AND TESTS: BMET      Component Value Date/Time   NA 138 03/13/2017 1119   K 4.6 03/13/2017 1119   CL 101 03/13/2017 1119   CO2 21 03/13/2017 1119   GLUCOSE 78 03/13/2017 1119   GLUCOSE 119 (H) 09/29/2015 0514   BUN 9 03/13/2017 1119   CREATININE 0.78 03/13/2017 1119   CALCIUM 9.5 03/13/2017 1119   GFRNONAA 101 03/13/2017 1119   GFRAA 116 03/13/2017 1119   Lab Results  Component Value Date   HGBA1C 6.1 (H) 03/13/2017   Lab Results  Component Value Date   INSULIN 44.5 (H) 03/13/2017   CBC    Component Value Date/Time   WBC 12.9 (H) 03/13/2017 1119   WBC 14.9 (H) 09/29/2015 0514   RBC 5.48 (H) 03/13/2017 1119   RBC 4.34 09/29/2015 0514   HGB 14.2 03/13/2017 1119   HCT 43.9 03/13/2017 1119   PLT 414 (H) 10/05/2015 0909   MCV 80 03/13/2017 1119   MCH 25.9 (L) 03/13/2017 1119   MCH 25.8 (L) 09/29/2015 0514   MCHC 32.3 03/13/2017 1119   MCHC 32.7 09/29/2015 0514   RDW 15.2 03/13/2017 1119   LYMPHSABS 2.7 03/13/2017 1119   MONOABS 0.6 09/28/2015 0929   EOSABS 0.5 (H) 03/13/2017 1119   BASOSABS 0.0 03/13/2017 1119   Iron/TIBC/Ferritin/ %Sat No results found for: IRON, TIBC, FERRITIN, IRONPCTSAT Lipid Panel     Component Value Date/Time   CHOL 182 03/13/2017 1119   TRIG 220 (H) 03/13/2017 1119   HDL 61 03/13/2017 1119   LDLCALC 77 03/13/2017 1119   Hepatic Function Panel     Component Value Date/Time   PROT 7.9 03/13/2017 1119   ALBUMIN 4.7 03/13/2017 1119   AST 23 03/13/2017 1119   ALT 26 03/13/2017 1119   ALKPHOS 66 03/13/2017 1119   BILITOT 0.3 03/13/2017 1119      Component Value Date/Time   TSH 1.720 03/13/2017 1119    ASSESSMENT AND PLAN: Prediabetes - Plan: Comprehensive metabolic panel, Hemoglobin A1c, Insulin, random, metFORMIN (GLUCOPHAGE) 500 MG tablet  Vitamin D deficiency - Plan: VITAMIN D 25 Hydroxy (Vit-D Deficiency, Fractures)  Other hyperlipidemia - Plan: Lipid Panel With LDL/HDL Ratio  Other depression - with emotional eating - Plan: buPROPion  (WELLBUTRIN SR) 150 MG 12 hr tablet  Class 1 obesity with serious comorbidity and body mass index (BMI) of 31.0 to 31.9 in adult, unspecified obesity type  PLAN:  Pre-Diabetes Elizabethann will continue to work on weight loss, exercise, and decreasing simple carbohydrates in her diet to help decrease the risk of diabetes. We dicussed metformin including benefits and risks. She was informed that eating too many simple carbohydrates or too many calories at one sitting increases the likelihood of GI side effects. Canna agrees to continue taking metformin 500 mg q AM #30 and we will refill for 1 month. Aritha agrees to follow up with our clinic in 2 weeks as directed to monitor her progress.  Vitamin D Deficiency Xitlalli was informed that low vitamin D levels contributes to fatigue and are associated with obesity, breast, and colon cancer. Elizabeht agrees to continue taking prescription Vit D @50 ,000 IU every week #4 and will follow up for routine testing of vitamin D, at least 2-3 times per year. She was informed of the risk of over-replacement of vitamin D and agrees to not increase her dose unless he discusses this with [place] first. We will check labs and Zeriyah agrees to follow up with our clinic in 2 weeks.  Hyperlipidemia Nyella was informed of the American Heart Association Guidelines emphasizing intensive lifestyle modifications as the first line treatment for hyperlipidemia. We discussed many lifestyle modifications today in depth, and Nataki will continue to work on decreasing saturated fats such as fatty red meat, butter and many fried foods. She will also increase vegetables and lean protein in her diet and continue to work on exercise and weight loss efforts. We will check labs and Zailah agrees to follow up with our clinic in 2 weeks.  Depression with Emotional Eating Behaviors We discussed behavior modification techniques today to help Alean deal with her emotional eating and depression.  Buelah agrees to start Wellbutrin SR 150 mg q AM #30 with no refills and she will follow up with our clinic in 2 weeks.  Obesity Leslieanne is currently in the action stage of change. As such, her goal is to continue with weight loss efforts She has agreed to keep a food journal with 400-550 calories and 40 grams of protein at supper daily and follow the Category 3 plan Aliscia has been instructed to work up to a goal of 150 minutes of combined cardio and strengthening exercise per week for weight loss and overall health benefits. We discussed the following Behavioral Modification Strategies today: increasing lean protein intake, decreasing simple carbohydrates, no skipping meals, planning for success, and keep a strict food journal    Kenidee has agreed to follow up with our clinic in 2 weeks. She was informed of the importance of frequent follow up visits to maximize her success with intensive lifestyle modifications for her multiple health conditions.  I, Burt Knack, am acting as transcriptionist for Quillian Quince, MD  I have reviewed the above documentation for accuracy and completeness, and I agree with the above. -Quillian Quince, MD     Today's visit was # 6 out of 22.  Starting weight: 181 lbs Starting date: 03/13/17 Today's weight : 172 lbs  Today's date: 05/30/2017 Total lbs lost to date: 9 (Patients must lose 7 lbs in the first 6 months to continue with counseling)   ASK: We discussed the diagnosis of obesity with Mindi Curling today and [place] agreed to give [place] permission to discuss obesity behavioral modification therapy today.  ASSESS: Hanan has the diagnosis of obesity and her BMI today is 31.45 Mylo is in the action stage of change   ADVISE: Dan was educated on the multiple health  risks of obesity as well as the benefit of weight loss to improve her health. She was advised of the need for long term treatment and the importance of lifestyle  modifications.  AGREE: Multiple dietary modification options and treatment options were discussed and  Travia agreed to keep a food journal with 400-550 calories and 40 grams of protein at supper daily and follow the Category 3 plan We discussed the following Behavioral Modification Strategies today: increasing lean protein intake, decreasing simple carbohydrates, no skipping meals, planning for success, and keep a strict food journal

## 2017-05-31 LAB — COMPREHENSIVE METABOLIC PANEL
A/G RATIO: 1.4 (ref 1.2–2.2)
ALBUMIN: 4.2 g/dL (ref 3.5–5.5)
ALT: 13 IU/L (ref 0–32)
AST: 13 IU/L (ref 0–40)
Alkaline Phosphatase: 51 IU/L (ref 39–117)
BUN/Creatinine Ratio: 18 (ref 9–23)
BUN: 13 mg/dL (ref 6–20)
Bilirubin Total: 0.2 mg/dL (ref 0.0–1.2)
CALCIUM: 9.3 mg/dL (ref 8.7–10.2)
CO2: 22 mmol/L (ref 20–29)
CREATININE: 0.74 mg/dL (ref 0.57–1.00)
Chloride: 103 mmol/L (ref 96–106)
GFR, EST AFRICAN AMERICAN: 123 mL/min/{1.73_m2} (ref >59)
GFR, EST NON AFRICAN AMERICAN: 107 mL/min/{1.73_m2} (ref >59)
GLOBULIN, TOTAL: 2.9 g/dL (ref 1.5–4.5)
Glucose: 88 mg/dL (ref 65–99)
POTASSIUM: 4.8 mmol/L (ref 3.5–5.2)
SODIUM: 140 mmol/L (ref 134–144)
TOTAL PROTEIN: 7.1 g/dL (ref 6.0–8.5)

## 2017-05-31 LAB — INSULIN, RANDOM: INSULIN: 28.3 u[IU]/mL — ABNORMAL HIGH (ref 2.6–24.9)

## 2017-05-31 LAB — HEMOGLOBIN A1C
Est. average glucose Bld gHb Est-mCnc: 117 mg/dL
HEMOGLOBIN A1C: 5.7 % — AB (ref 4.8–5.6)

## 2017-05-31 LAB — VITAMIN D 25 HYDROXY (VIT D DEFICIENCY, FRACTURES): Vit D, 25-Hydroxy: 65.6 ng/mL (ref 30.0–100.0)

## 2017-05-31 LAB — LIPID PANEL WITH LDL/HDL RATIO
CHOLESTEROL TOTAL: 136 mg/dL (ref 100–199)
HDL: 42 mg/dL (ref >39)
LDL CALC: 50 mg/dL (ref 0–99)
LDl/HDL Ratio: 1.2 ratio (ref 0.0–3.2)
TRIGLYCERIDES: 221 mg/dL — AB (ref 0–149)
VLDL CHOLESTEROL CAL: 44 mg/dL — AB (ref 5–40)

## 2017-06-07 ENCOUNTER — Encounter (INDEPENDENT_AMBULATORY_CARE_PROVIDER_SITE_OTHER): Payer: Self-pay | Admitting: Family Medicine

## 2017-06-13 ENCOUNTER — Ambulatory Visit (INDEPENDENT_AMBULATORY_CARE_PROVIDER_SITE_OTHER): Payer: Medicaid Other | Admitting: Family Medicine

## 2017-06-13 VITALS — BP 121/78 | HR 67 | Temp 98.6°F | Ht 62.0 in | Wt 166.0 lb

## 2017-06-13 DIAGNOSIS — R7303 Prediabetes: Secondary | ICD-10-CM

## 2017-06-13 DIAGNOSIS — F3289 Other specified depressive episodes: Secondary | ICD-10-CM | POA: Diagnosis not present

## 2017-06-13 DIAGNOSIS — E669 Obesity, unspecified: Secondary | ICD-10-CM | POA: Diagnosis not present

## 2017-06-13 DIAGNOSIS — E559 Vitamin D deficiency, unspecified: Secondary | ICD-10-CM

## 2017-06-13 DIAGNOSIS — Z6830 Body mass index (BMI) 30.0-30.9, adult: Secondary | ICD-10-CM

## 2017-06-13 MED ORDER — METFORMIN HCL 500 MG PO TABS: 500.0000 mg | ORAL_TABLET | Freq: Every day | ORAL | 0 refills | Status: DC

## 2017-06-13 MED ORDER — BUPROPION HCL ER (SR) 150 MG PO TB12: 150.0000 mg | ORAL_TABLET | Freq: Every day | ORAL | 0 refills | Status: DC

## 2017-06-13 MED ORDER — VITAMIN D (ERGOCALCIFEROL) 1.25 MG (50000 UNIT) PO CAPS: 50000.0000 [IU] | ORAL_CAPSULE | ORAL | 0 refills | Status: DC

## 2017-06-13 NOTE — Progress Notes (Signed)
Office: [phone]  /  Fax: [phone]   HPI:   Chief Complaint: OBESITY Lauren Cardenas is here to discuss her progress with her obesity treatment plan. She is on the  keep a food journal with 400-550 calories and 40 grams of protein  and follow the Category 3 plan and is following her eating plan approximately 85 % of the time. She states she is exercising 0 minutes 0 times per week. Lauren Cardenas has done better with a combination of journaling and Category 3. She is doing well being creative with recipes and increasing lean protein and vegetables.  Her weight is 166 lb (75.3 kg) today and has had a weight loss of 6 pounds over a period of 2 weeks since her last visit. She has lost 15 lbs since starting treatment with us.  Pre-Diabetes Lauren Cardenas has a diagnosis of pre-diabetes based on her elevated Hgb A1c and was informed this puts her at greater risk of developing diabetes. She has greatly improved on metformin currently and with diet and exercise to decrease risk of diabetes. She denies nausea, vomiting, or hypoglycemia.  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 She has greatly improved on prescription Vit Cardenas, she is now at goal. She denies nausea, vomiting or muscle weakness.  Depression with emotional eating behaviors Lauren Cardenas's mood is stable and she has decreased emotional eating. Lauren Cardenas struggles with emotional eating and using food for comfort to the extent that it is negatively impacting her health. She often snacks when she is not hungry. Lauren Cardenas sometimes feels she is out of control and then feels guilty that she made poor food choices. She has been working on behavior modification techniques to help reduce her emotional eating and has been somewhat successful. She shows no sign of suicidal or homicidal ideations.  [facility] 05/10/2017 03/13/2017 02/14/2017 08/09/2016 10/05/2015  Decreased Interest 0 1 0 0 0  Down, Depressed, Hopeless 0 1 0 0 0    PHQ - 2 Score 0 2 0 0 0  Altered sleeping - 3 - - -  Tired, decreased energy - 3 - - -  Change in appetite - 2 - - -  Feeling bad or failure about yourself  - 1 - - -  Trouble concentrating - 1 - - -  Moving slowly or fidgety/restless - 1 - - -  Suicidal thoughts - 0 - - -  PHQ-9 Score - 13 - - -  Difficult doing work/chores - Very difficult - - -   ALLERGIES: Allergies  Allergen Reactions  . Amoxicillin Hives    MEDICATIONS: Current Outpatient Medications on File Prior to Visit  Medication Sig Dispense Refill  . acetaminophen (TYLENOL) 500 MG tablet Take 1,000 mg by mouth every 6 (six) hours as needed.    . benzonatate (TESSALON) 200 MG capsule Take 1 capsule (200 mg total) 2 (two) times daily as needed by mouth for cough. 20 capsule 0  . cetirizine (ZYRTEC ALLERGY) 10 MG tablet Take 1 tablet (10 mg total) daily by mouth. 90 tablet 0  . CRANBERRY PO Take 500 mg by mouth daily.    . cycloSPORINE (RESTASIS) 0.05 % ophthalmic emulsion Place 1 drop into both eyes 2 (two) times daily.    . fluticasone (FLONASE) 50 MCG/ACT nasal spray Place 2 sprays as needed into both nostrils for allergies or rhinitis. 16 g 0  . HYDROcodone-acetaminophen (NORCO) 7.5-325 MG tablet Take 1 tablet by mouth every 6 (six) hours as needed for  moderate pain.    Marland Kitchen ibuprofen (ADVIL,MOTRIN) 200 MG tablet Take 400-800 mg by mouth 3 (three) times daily.     Marland Kitchen ketorolac (TORADOL) 10 MG tablet Take 10 mg by mouth every 6 (six) hours as needed.    Marland Kitchen levonorgestrel-ethinyl estradiol (AVIANE,ALESSE,LESSINA) 0.1-20 MG-MCG tablet Take 1 tablet by mouth daily.    . Multiple Vitamins-Minerals (CENTRUM MULTIGUMMIES) CHEW Chew 1 tablet by mouth daily.    . Olopatadine HCl (PAZEO) 0.7 % SOLN Apply 1 drop to eye daily.    . promethazine (PHENERGAN) 25 MG tablet Take 1 tablet (25 mg total) as needed by mouth for nausea or vomiting. 30 tablet 11  . propranolol ER (INDERAL LA) 120 MG 24 hr capsule Take 1 capsule (120 mg total) at  bedtime by mouth. 90 capsule 4  . rizatriptan (MAXALT-MLT) 10 MG disintegrating tablet Take 1 tablet (10 mg total) 3 (three) times daily as needed by mouth for migraine. 9 tablet 11  . tamsulosin (FLOMAX) 0.4 MG CAPS capsule Take 0.4 mg by mouth as needed.     No current facility-administered medications on file prior to visit.     PAST MEDICAL HISTORY: Past Medical History:  Diagnosis Date  . Back pain   . Chronic migraine   . Constipation   . Dysuria   . GERD (gastroesophageal reflux disease)   . History of acute pyelonephritis    09-27-2015  . History of kidney stones   . HTN (hypertension)   . Interstitial cystitis   . Neck pain   . Nephrolithiasis    bilateral  non-obstructive per ct 09-27-2015  . OAB (overactive bladder)   . Obesity   . Right ureteral stone   . Shoulder pain   . Urgency of urination   . Vesicoureteral reflux     PAST SURGICAL HISTORY: Past Surgical History:  Procedure Laterality Date  . CYSTOSCOPY W/ URETERAL STENT PLACEMENT Right 09/27/2015   Procedure: CYSTOSCOPY WITH RETROGRADE PYELOGRAM/URETERAL STENT PLACEMENT;  Surgeon: Hildred Laser, MD;  Location: [facility];  Service: Urology;  Laterality: Right;  . CYSTOSCOPY WITH RETROGRADE PYELOGRAM, URETEROSCOPY AND STENT PLACEMENT Left 09/09/2014   Procedure: CYSTOSCOPY WITH LEFT RETROGRADE PYELOGRAM, URETEROSCOPY AND STONE EXTRACTION  DOUBLE J STENT PLACEMENT;  Surgeon: Jerilee Field, MD;  Location: [facility];  Service: Urology;  Laterality: Left;  . CYSTOSCOPY/URETEROSCOPY/HOLMIUM LASER/STENT PLACEMENT Right 10/16/2015   Procedure: RIGHT URETEROSCOPY/HOLMIUM LASER/STENT PLACEMENT;  Surgeon: Jerilee Field, MD;  Location: [facility];  Service: Urology;  Laterality: Right;  . HOLMIUM LASER APPLICATION Left 09/09/2014   Procedure: HOLMIUM LASER APPLICATION;  Surgeon: Jerilee Field, MD;  Location: [facility];  Service: Urology;  Laterality: Left;  . HOLMIUM LASER APPLICATION Right 10/16/2015    Procedure: HOLMIUM LASER APPLICATION;  Surgeon: Jerilee Field, MD;  Location: [facility];  Service: Urology;  Laterality: Right;  . STONE EXTRACTION WITH BASKET Right 10/16/2015   Procedure: STONE EXTRACTION WITH BASKET;  Surgeon: Jerilee Field, MD;  Location: [facility];  Service: Urology;  Laterality: Right;  . WISDOM TOOTH EXTRACTION  03/2014    SOCIAL HISTORY: Social History   Tobacco Use  . Smoking status: Never Smoker  . Smokeless tobacco: Never Used  Substance Use Topics  . Alcohol use: No  . Drug use: No    FAMILY HISTORY: Family History  Problem Relation Age of Onset  . Hypertension Mother   . Obesity Mother   . Diabetes Father   . Kidney disease Father   .  Sudden death Father     ROS: Review of Systems  Constitutional: Positive for weight loss.  Gastrointestinal: Negative for nausea and vomiting.  Musculoskeletal:       Negative muscle weakness  Endo/Heme/Allergies:       Negative hypoglycemia  Psychiatric/Behavioral: Positive for depression. Negative for suicidal ideas.    PHYSICAL EXAM: Blood pressure 121/78, pulse 67, temperature 98.6 F (37 C), temperature source Oral, height 5\' 2"  (1.575 m), weight 166 lb (75.3 kg), last menstrual period 05/16/2017, SpO2 100 %. Body mass index is 30.36 kg/m. Physical Exam  Constitutional: She is oriented to person, place, and time. She appears well-developed and well-nourished.  Cardiovascular: Normal rate.  Pulmonary/Chest: Effort normal.  Musculoskeletal: Normal range of motion.  Neurological: She is oriented to person, place, and time.  Skin: Skin is warm and dry.  Psychiatric: She has a normal mood and affect.  Vitals reviewed.   RECENT LABS AND TESTS: BMET    Component Value Date/Time   NA 140 05/30/2017 1058   K 4.8 05/30/2017 1058   CL 103 05/30/2017 1058   CO2 22 05/30/2017 1058   GLUCOSE 88 05/30/2017 1058   GLUCOSE 119 (H) 09/29/2015 0514   BUN 13 05/30/2017  1058   CREATININE 0.74 05/30/2017 1058   CALCIUM 9.3 05/30/2017 1058   GFRNONAA 107 05/30/2017 1058   GFRAA 123 05/30/2017 1058   Lab Results  Component Value Date   HGBA1C 5.7 (H) 05/30/2017   HGBA1C 6.1 (H) 03/13/2017   Lab Results  Component Value Date   INSULIN 28.3 (H) 05/30/2017   INSULIN 44.5 (H) 03/13/2017   CBC    Component Value Date/Time   WBC 12.9 (H) 03/13/2017 1119   WBC 14.9 (H) 09/29/2015 0514   RBC 5.48 (H) 03/13/2017 1119   RBC 4.34 09/29/2015 0514   HGB 14.2 03/13/2017 1119   HCT 43.9 03/13/2017 1119   PLT 414 (H) 10/05/2015 0909   MCV 80 03/13/2017 1119   MCH 25.9 (L) 03/13/2017 1119   MCH 25.8 (L) 09/29/2015 0514   MCHC 32.3 03/13/2017 1119   MCHC 32.7 09/29/2015 0514   RDW 15.2 03/13/2017 1119   LYMPHSABS 2.7 03/13/2017 1119   MONOABS 0.6 09/28/2015 0929   EOSABS 0.5 (H) 03/13/2017 1119   BASOSABS 0.0 03/13/2017 1119   Iron/TIBC/Ferritin/ %Sat No results found for: IRON, TIBC, FERRITIN, IRONPCTSAT Lipid Panel     Component Value Date/Time   CHOL 136 05/30/2017 1058   TRIG 221 (H) 05/30/2017 1058   HDL 42 05/30/2017 1058   LDLCALC 50 05/30/2017 1058   Hepatic Function Panel     Component Value Date/Time   PROT 7.1 05/30/2017 1058   ALBUMIN 4.2 05/30/2017 1058   AST 13 05/30/2017 1058   ALT 13 05/30/2017 1058   ALKPHOS 51 05/30/2017 1058   BILITOT 0.2 05/30/2017 1058      Component Value Date/Time   TSH 1.720 03/13/2017 1119    ASSESSMENT AND PLAN: Prediabetes - Plan: metFORMIN (GLUCOPHAGE) 500 MG tablet  Vitamin Cardenas deficiency - Plan: Vitamin Cardenas, Ergocalciferol, (DRISDOL) 50000 units CAPS capsule  Other depression - with emotional eating - Plan: buPROPion (WELLBUTRIN SR) 150 MG 12 hr tablet  Class 1 obesity with serious comorbidity and body mass index (BMI) of 30.0 to 30.9 in adult, unspecified obesity type  PLAN:  Pre-Diabetes Blaize will continue to work on weight loss, exercise, and decreasing simple carbohydrates in her  diet to help decrease the risk of diabetes. We dicussed metformin including  benefits and risks. She was informed that eating too many simple carbohydrates or too many calories at one sitting increases the likelihood of GI side effects. Lauren Cardenas agrees to continue taking metformin 500 mg q AM #30 and we will refill for 1 month. Lauren Cardenas agrees to follow up with our clinic in 3 weeks as directed to monitor her progress.  Vitamin Cardenas Deficiency Lauren Cardenas was informed that low vitamin Cardenas levels contributes to fatigue and are associated with obesity, breast, and colon cancer. Lauren Cardenas agrees to continue taking prescription Vit Cardenas @50 ,000 IU every week #4 and we will refill for 1 month. She will follow up for routine testing of vitamin Cardenas, at least 2-3 times per year. She was informed of the risk of over-replacement of vitamin Cardenas and agrees to not increase her dose unless he discusses this with us first. We will recheck labs in 3 months, now at risk of over-replacement. Lauren Cardenas agrees to follow up with our clinic in 3 weeks.   Depression with Emotional Eating Behaviors We discussed behavior modification techniques today to help Lauren Cardenas deal with her emotional eating and depression. Lauren Cardenas agrees to continue taking Wellbutrin SR 150 mg qd #30 and we will refill for 1 month. Lauren Cardenas agrees to follow up with our clinic in 3 weeks.  Obesity Lauren Cardenas is currently in the action stage of change. As such, her goal is to continue with weight loss efforts She has agreed to keep a food journal with 400-550 calories and 40 grams of protein at supper daily and follow the Category 3 plan Lauren Cardenas has been instructed to work up to a goal of 150 minutes of combined cardio and strengthening exercise per week for weight loss and overall health benefits. We discussed the following Behavioral Modification Strategies today: holiday eating strategies and keep a strict food journal   Lauren Cardenas has agreed to follow up with our clinic in 3  weeks. She was informed of the importance of frequent follow up visits to maximize her success with intensive lifestyle modifications for her multiple health conditions.  I, Burt KnackSharon Martin, am acting as transcriptionist for Quillian Quincearen Cashlynn Yearwood, MD  I have reviewed the above documentation for accuracy and completeness, and I agree with the above. -Quillian Quincearen Cala Kruckenberg, MD     Today's visit was # 7 out of 22.  Starting weight: 181 lbs  Starting date: 03/13/17 Today's weight : 166 lbs  Today's date: 06/13/2017 Total lbs lost to date: 15 (Patients must lose 7 lbs in the first 6 months to continue with counseling)   ASK: We discussed the diagnosis of obesity with Lauren Cardenas Arnesen today and Batoul agreed to give us permission to discuss obesity behavioral modification therapy today.  ASSESS: Psalms has the diagnosis of obesity and her BMI today is 30.35 Duana is in the action stage of change   ADVISE: Azariah was educated on the multiple health risks of obesity as well as the benefit of weight loss to improve her health. She was advised of the need for long term treatment and the importance of lifestyle modifications.  AGREE: Multiple dietary modification options and treatment options were discussed and  Darnelle agreed to keep a food journal with 400-550 calories and 40 grams of protein at supper daily and follow the Category 3 plan We discussed the following Behavioral Modification Strategies today: holiday eating strategies and keep a strict food journal

## 2017-06-25 ENCOUNTER — Other Ambulatory Visit (INDEPENDENT_AMBULATORY_CARE_PROVIDER_SITE_OTHER): Payer: Self-pay | Admitting: Family Medicine

## 2017-06-25 DIAGNOSIS — F3289 Other specified depressive episodes: Secondary | ICD-10-CM

## 2017-06-25 DIAGNOSIS — R7303 Prediabetes: Secondary | ICD-10-CM

## 2017-06-25 DIAGNOSIS — E559 Vitamin D deficiency, unspecified: Secondary | ICD-10-CM

## 2017-07-04 ENCOUNTER — Ambulatory Visit (INDEPENDENT_AMBULATORY_CARE_PROVIDER_SITE_OTHER): Payer: Medicaid Other | Admitting: Family Medicine

## 2017-07-04 VITALS — BP 116/77 | HR 76 | Temp 98.9°F | Ht 62.0 in | Wt 167.0 lb

## 2017-07-04 DIAGNOSIS — E669 Obesity, unspecified: Secondary | ICD-10-CM

## 2017-07-04 DIAGNOSIS — R7303 Prediabetes: Secondary | ICD-10-CM

## 2017-07-04 DIAGNOSIS — Z6830 Body mass index (BMI) 30.0-30.9, adult: Secondary | ICD-10-CM

## 2017-07-04 NOTE — Progress Notes (Signed)
Office: [phone]  /  Fax: [phone]   HPI:   Chief Complaint: OBESITY [name] is here to discuss her progress with her obesity treatment plan. She is on the keep a food journal with 400-550 calories and 40 grams of protein at supper daily and follow the Category 3 plan and is following her eating plan approximately 60 % of the time. She states she is exercising 0 minutes 0 times per week. Lauren Cardenas continues to do well with weight maintenance over the holidays. She only gained 1 pound and she is ready to get back on track.  Her weight is 167 lb (75.8 kg) today and has gained 1 pound since her last visit. She has lost 14 lbs since starting treatment with [place].  Pre-Diabetes Lauren Cardenas has a diagnosis of pre-diabetes based on her elevated Hgb A1c and was informed this puts her at greater risk of developing diabetes. She is well controlled with metformin, she is struggling with diet more but ready to get back on track and continue to work on diet and exercise to decrease risk of diabetes. Her A1c is improving. She notes decreased polyphagia and she denies nausea, vomiting, or hypoglycemia.  ALLERGIES: Allergies  Allergen Reactions  . Amoxicillin Hives    MEDICATIONS: Current Outpatient Medications on File Prior to Visit  Medication Sig Dispense Refill  . acetaminophen (TYLENOL) 500 MG tablet Take 1,000 mg by mouth every 6 (six) hours as needed.    . benzonatate (TESSALON) 200 MG capsule Take 1 capsule (200 mg total) 2 (two) times daily as needed by mouth for cough. 20 capsule 0  . buPROPion (WELLBUTRIN SR) 150 MG 12 hr tablet Take 1 tablet (150 mg total) by mouth daily. 30 tablet 0  . cetirizine (ZYRTEC ALLERGY) 10 MG tablet Take 1 tablet (10 mg total) daily by mouth. 90 tablet 0  . CRANBERRY PO Take 500 mg by mouth daily.    . cycloSPORINE (RESTASIS) 0.05 % ophthalmic emulsion Place 1 drop into both eyes 2 (two) times daily.    . fluticasone (FLONASE) 50 MCG/ACT nasal spray Place 2  sprays as needed into both nostrils for allergies or rhinitis. 16 g 0  . HYDROcodone-acetaminophen (NORCO) 7.5-325 MG tablet Take 1 tablet by mouth every 6 (six) hours as needed for moderate pain.    Marland Kitchen ibuprofen (ADVIL,MOTRIN) 200 MG tablet Take 400-800 mg by mouth 3 (three) times daily.     Marland Kitchen ketorolac (TORADOL) 10 MG tablet Take 10 mg by mouth every 6 (six) hours as needed.    Marland Kitchen levonorgestrel-ethinyl estradiol (AVIANE,ALESSE,LESSINA) 0.1-20 MG-MCG tablet Take 1 tablet by mouth daily.    . metFORMIN (GLUCOPHAGE) 500 MG tablet Take 1 tablet (500 mg total) by mouth daily with breakfast. 30 tablet 0  . Multiple Vitamins-Minerals (CENTRUM MULTIGUMMIES) CHEW Chew 1 tablet by mouth daily.    . Olopatadine HCl (PAZEO) 0.7 % SOLN Apply 1 drop to eye daily.    . promethazine (PHENERGAN) 25 MG tablet Take 1 tablet (25 mg total) as needed by mouth for nausea or vomiting. 30 tablet 11  . propranolol ER (INDERAL LA) 120 MG 24 hr capsule Take 1 capsule (120 mg total) at bedtime by mouth. 90 capsule 4  . rizatriptan (MAXALT-MLT) 10 MG disintegrating tablet Take 1 tablet (10 mg total) 3 (three) times daily as needed by mouth for migraine. 9 tablet 11  . tamsulosin (FLOMAX) 0.4 MG CAPS capsule Take 0.4 mg by mouth as needed.    . Vitamin D, Ergocalciferol, (  DRISDOL) 50000 units CAPS capsule Take 1 capsule (50,000 Units total) by mouth every 7 (seven) days. 4 capsule 0   No current facility-administered medications on file prior to visit.     PAST MEDICAL HISTORY: Past Medical History:  Diagnosis Date  . Back pain   . Chronic migraine   . Constipation   . Dysuria   . GERD (gastroesophageal reflux disease)   . History of acute pyelonephritis    09-27-2015  . History of kidney stones   . HTN (hypertension)   . Interstitial cystitis   . Neck pain   . Nephrolithiasis    bilateral  non-obstructive per ct 09-27-2015  . OAB (overactive bladder)   . Obesity   . Right ureteral stone   . Shoulder pain   .  Urgency of urination   . Vesicoureteral reflux     PAST SURGICAL HISTORY: Past Surgical History:  Procedure Laterality Date  . CYSTOSCOPY W/ URETERAL STENT PLACEMENT Right 09/27/2015   Procedure: CYSTOSCOPY WITH RETROGRADE PYELOGRAM/URETERAL STENT PLACEMENT;  Surgeon: Hildred LaserBrian James Budzyn, MD;  Location: [facility];  Service: Urology;  Laterality: Right;  . CYSTOSCOPY WITH RETROGRADE PYELOGRAM, URETEROSCOPY AND STENT PLACEMENT Left 09/09/2014   Procedure: CYSTOSCOPY WITH LEFT RETROGRADE PYELOGRAM, URETEROSCOPY AND STONE EXTRACTION  DOUBLE J STENT PLACEMENT;  Surgeon: Jerilee FieldMatthew Eskridge, MD;  Location: [facility];  Service: Urology;  Laterality: Left;  . CYSTOSCOPY/URETEROSCOPY/HOLMIUM LASER/STENT PLACEMENT Right 10/16/2015   Procedure: RIGHT URETEROSCOPY/HOLMIUM LASER/STENT PLACEMENT;  Surgeon: Jerilee FieldMatthew Eskridge, MD;  [facility] [place];  Service: Urology;  Laterality: Right;  . HOLMIUM LASER APPLICATION Left 09/09/2014   Procedure: HOLMIUM LASER APPLICATION;  Surgeon: Jerilee FieldMatthew Eskridge, MD;  Location: [facility];  Service: Urology;  Laterality: Left;  . HOLMIUM LASER APPLICATION Right 10/16/2015   Procedure: HOLMIUM LASER APPLICATION;  Surgeon: Jerilee FieldMatthew Eskridge, MD;  [facility] [place];  Service: Urology;  Laterality: Right;  . STONE EXTRACTION WITH BASKET Right 10/16/2015   Procedure: STONE EXTRACTION WITH BASKET;  Surgeon: Jerilee FieldMatthew Eskridge, MD;  [facility]  Service: Urology;  Laterality: Right;  . WISDOM TOOTH EXTRACTION  03/2014    SOCIAL HISTORY: Social History   Tobacco Use  . Smoking status: Never Smoker  . Smokeless tobacco: Never Used  Substance Use Topics  . Alcohol use: No  . Drug use: No    FAMILY HISTORY: Family History  Problem Relation Age of Onset  . Hypertension Mother   . Obesity Mother   . Diabetes Father   . Kidney disease Father   . Sudden death Father     ROS: Review of Systems  Constitutional: Negative for  weight loss.  Gastrointestinal: Negative for nausea and vomiting.  Endo/Heme/Allergies:       Positive polyphagia Negative hypoglycemia    PHYSICAL EXAM: Blood pressure 116/77, pulse 76, temperature 98.9 F (37.2 C), temperature source Oral, height 5\' 2"  (1.575 m), weight 167 lb (75.8 kg), SpO2 99 %. Body mass index is 30.54 kg/m. Physical Exam  Constitutional: She is oriented to person, place, and time. She appears well-developed and well-nourished.  Cardiovascular: Normal rate.  Pulmonary/Chest: Effort normal.  Musculoskeletal: Normal range of motion.  Neurological: She is oriented to person, place, and time.  Skin: Skin is warm and dry.  Psychiatric: She has a normal mood and affect. Her behavior is normal.  Vitals reviewed.   RECENT LABS AND TESTS: BMET    Component Value Date/Time   NA 140 05/30/2017 1058   K 4.8 05/30/2017  1058   CL 103 05/30/2017 1058   CO2 22 05/30/2017 1058   GLUCOSE 88 05/30/2017 1058   GLUCOSE 119 (H) 09/29/2015 0514   BUN 13 05/30/2017 1058   CREATININE 0.74 05/30/2017 1058   CALCIUM 9.3 05/30/2017 1058   GFRNONAA 107 05/30/2017 1058   GFRAA 123 05/30/2017 1058   Lab Results  Component Value Date   HGBA1C 5.7 (H) 05/30/2017   HGBA1C 6.1 (H) 03/13/2017   Lab Results  Component Value Date   INSULIN 28.3 (H) 05/30/2017   INSULIN 44.5 (H) 03/13/2017   CBC    Component Value Date/Time   WBC 12.9 (H) 03/13/2017 1119   WBC 14.9 (H) 09/29/2015 0514   RBC 5.48 (H) 03/13/2017 1119   RBC 4.34 09/29/2015 0514   HGB 14.2 03/13/2017 1119   HCT 43.9 03/13/2017 1119   PLT 414 (H) 10/05/2015 0909   MCV 80 03/13/2017 1119   MCH 25.9 (L) 03/13/2017 1119   MCH 25.8 (L) 09/29/2015 0514   MCHC 32.3 03/13/2017 1119   MCHC 32.7 09/29/2015 0514   RDW 15.2 03/13/2017 1119   LYMPHSABS 2.7 03/13/2017 1119   MONOABS 0.6 09/28/2015 0929   EOSABS 0.5 (H) 03/13/2017 1119   BASOSABS 0.0 03/13/2017 1119   Iron/TIBC/Ferritin/ %Sat No results found for:  IRON, TIBC, FERRITIN, IRONPCTSAT Lipid Panel     Component Value Date/Time   CHOL 136 05/30/2017 1058   TRIG 221 (H) 05/30/2017 1058   HDL 42 05/30/2017 1058   LDLCALC 50 05/30/2017 1058   Hepatic Function Panel     Component Value Date/Time   PROT 7.1 05/30/2017 1058   ALBUMIN 4.2 05/30/2017 1058   AST 13 05/30/2017 1058   ALT 13 05/30/2017 1058   ALKPHOS 51 05/30/2017 1058   BILITOT 0.2 05/30/2017 1058      Component Value Date/Time   TSH 1.720 03/13/2017 1119    ASSESSMENT AND PLAN: Prediabetes  Class 1 obesity with serious comorbidity and body mass index (BMI) of 30.0 to 30.9 in adult, unspecified obesity type  PLAN:  Pre-Diabetes Lauren Cardenas will continue to work on diet, weight loss, exercise, and decreasing simple carbohydrates in her diet to help decrease the risk of diabetes. We dicussed metformin including benefits and risks. She was informed that eating too many simple carbohydrates or too many calories at one sitting increases the likelihood of GI side effects. Lauren Cardenas agrees to continue taking metformin as prescribed. Lauren Cardenas agrees to follow up with our clinic in 2 weeks as directed to monitor her progress.  Obesity Lauren Cardenas is currently in the action stage of change. As such, her goal is to continue with weight loss efforts She has agreed to keep a food journal with 400-550 calories and 40 grams of protein at supper daily and follow the Category 3 plan Lauren Cardenas has been instructed to work up to a goal of 150 minutes of combined cardio and strengthening exercise per week for weight loss and overall health benefits. We discussed the following Behavioral Modification Strategies today: increasing lean protein intake, work on meal planning and easy cooking plans and dealing with family or coworker sabotage   Lauren Cardenas has agreed to follow up with our clinic in 2 weeks. She was informed of the importance of frequent follow up visits to maximize her success with intensive  lifestyle modifications for her multiple health conditions.   OBESITY BEHAVIORAL INTERVENTION VISIT  Today's visit was # 8 out of 22.  Starting weight: 181 lbs Starting date: 03/13/17 Today's weight :  167 lbs  Today's date: 07/04/2017 Total lbs lost to date: 14 (Patients must lose 7 lbs in the first 6 months to continue with counseling)   ASK: We discussed the diagnosis of obesity with Lauren Cardenas today and Lauren Cardenas agreed to give [place] permission to discuss obesity behavioral modification therapy today.  ASSESS: Lauren Cardenas has the diagnosis of obesity and her BMI today is 30.54 Lauren Cardenas is in the action stage of change   ADVISE: Lauren Cardenas was educated on the multiple health risks of obesity as well as the benefit of weight loss to improve her health. She was advised of the need for long term treatment and the importance of lifestyle modifications.  AGREE: Multiple dietary modification options and treatment options were discussed and  Lauren Cardenas agreed to the above obesity treatment plan.  I, Burt Knack, am acting as transcriptionist for Quillian Quince, MD  I have reviewed the above documentation for accuracy and completeness, and I agree with the above. -Quillian Quince, MD

## 2017-07-17 ENCOUNTER — Ambulatory Visit (INDEPENDENT_AMBULATORY_CARE_PROVIDER_SITE_OTHER): Payer: Medicaid Other | Admitting: Family Medicine

## 2017-07-19 ENCOUNTER — Ambulatory Visit (INDEPENDENT_AMBULATORY_CARE_PROVIDER_SITE_OTHER): Payer: Medicaid Other | Admitting: Family Medicine

## 2017-07-19 VITALS — BP 123/79 | HR 79 | Temp 98.4°F | Ht 62.0 in | Wt 164.0 lb

## 2017-07-19 DIAGNOSIS — Z6830 Body mass index (BMI) 30.0-30.9, adult: Secondary | ICD-10-CM | POA: Diagnosis not present

## 2017-07-19 DIAGNOSIS — E559 Vitamin D deficiency, unspecified: Secondary | ICD-10-CM | POA: Diagnosis not present

## 2017-07-19 DIAGNOSIS — E669 Obesity, unspecified: Secondary | ICD-10-CM | POA: Diagnosis not present

## 2017-07-19 NOTE — Progress Notes (Signed)
Office: [phone]  /  Fax: [phone]   HPI:   Chief Complaint: OBESITY Lauren Cardenas is here to discuss her progress with her obesity treatment plan. She is on the keep a food journal with 400 to 550 calories and 40 grams of protein at supper daily and the Category 3 plan and is following her eating plan approximately 60 % of the time. She states she is exercising 0 minutes 0 times per week. Lauren Cardenas continues to do well with weight loss. She was allowed to journal for dinner and is doing very well with researching recipes and making good choices. Her weight is 164 lb (74.4 kg) today and has had a weight loss of 3 pounds over a period of 2 weeks since her last visit. She has lost 17 lbs since starting treatment with [place].  Vitamin D deficiency Lauren Cardenas has a diagnosis of vitamin D deficiency. She is on prescription vit D and level is now at goal. Lauren Cardenas is higher risk of over replacement while losing weight. She denies nausea, vomiting or muscle weakness.   Ref. Range 05/30/2017 10:58  Vitamin D, 25-Hydroxy Latest Ref Range: 30.0 - 100.0 ng/mL 65.6    ALLERGIES: Allergies  Allergen Reactions  . Amoxicillin Hives    MEDICATIONS: Current Outpatient Medications on File Prior to Visit  Medication Sig Dispense Refill  . acetaminophen (TYLENOL) 500 MG tablet Take 1,000 mg by mouth every 6 (six) hours as needed.    . benzonatate (TESSALON) 200 MG capsule Take 1 capsule (200 mg total) 2 (two) times daily as needed by mouth for cough. 20 capsule 0  . buPROPion (WELLBUTRIN SR) 150 MG 12 hr tablet Take 1 tablet (150 mg total) by mouth daily. 30 tablet 0  . cetirizine (ZYRTEC ALLERGY) 10 MG tablet Take 1 tablet (10 mg total) daily by mouth. 90 tablet 0  . CRANBERRY PO Take 500 mg by mouth daily.    . cycloSPORINE (RESTASIS) 0.05 % ophthalmic emulsion Place 1 drop into both eyes 2 (two) times daily.    . fluticasone (FLONASE) 50 MCG/ACT nasal spray Place 2 sprays as needed into both nostrils for  allergies or rhinitis. 16 g 0  . HYDROcodone-acetaminophen (NORCO) 7.5-325 MG tablet Take 1 tablet by mouth every 6 (six) hours as needed for moderate pain.    Marland Kitchen ibuprofen (ADVIL,MOTRIN) 200 MG tablet Take 400-800 mg by mouth 3 (three) times daily.     Marland Kitchen ketorolac (TORADOL) 10 MG tablet Take 10 mg by mouth every 6 (six) hours as needed.    Marland Kitchen levonorgestrel-ethinyl estradiol (AVIANE,ALESSE,LESSINA) 0.1-20 MG-MCG tablet Take 1 tablet by mouth daily.    . metFORMIN (GLUCOPHAGE) 500 MG tablet Take 1 tablet (500 mg total) by mouth daily with breakfast. 30 tablet 0  . Multiple Vitamins-Minerals (CENTRUM MULTIGUMMIES) CHEW Chew 1 tablet by mouth daily.    . Olopatadine HCl (PAZEO) 0.7 % SOLN Apply 1 drop to eye daily.    . promethazine (PHENERGAN) 25 MG tablet Take 1 tablet (25 mg total) as needed by mouth for nausea or vomiting. 30 tablet 11  . propranolol ER (INDERAL LA) 120 MG 24 hr capsule Take 1 capsule (120 mg total) at bedtime by mouth. 90 capsule 4  . rizatriptan (MAXALT-MLT) 10 MG disintegrating tablet Take 1 tablet (10 mg total) 3 (three) times daily as needed by mouth for migraine. 9 tablet 11  . tamsulosin (FLOMAX) 0.4 MG CAPS capsule Take 0.4 mg by mouth as needed.    . Vitamin D, Ergocalciferol, (  DRISDOL) 50000 units CAPS capsule Take 1 capsule (50,000 Units total) by mouth every 7 (seven) days. 4 capsule 0   No current facility-administered medications on file prior to visit.     PAST MEDICAL HISTORY: Past Medical History:  Diagnosis Date  . Back pain   . Chronic migraine   . Constipation   . Dysuria   . GERD (gastroesophageal reflux disease)   . History of acute pyelonephritis    09-27-2015  . History of kidney stones   . HTN (hypertension)   . Interstitial cystitis   . Neck pain   . Nephrolithiasis    bilateral  non-obstructive per ct 09-27-2015  . OAB (overactive bladder)   . Obesity   . Right ureteral stone   . Shoulder pain   . Urgency of urination   .  Vesicoureteral reflux     PAST SURGICAL HISTORY: Past Surgical History:  Procedure Laterality Date  . CYSTOSCOPY W/ URETERAL STENT PLACEMENT Right 09/27/2015   Procedure: CYSTOSCOPY WITH RETROGRADE PYELOGRAM/URETERAL STENT PLACEMENT;  Surgeon: Hildred Laser, MD;  Location: [facility];  Service: Urology;  Laterality: Right;  . CYSTOSCOPY WITH RETROGRADE PYELOGRAM, URETEROSCOPY AND STENT PLACEMENT Left 09/09/2014   Procedure: CYSTOSCOPY WITH LEFT RETROGRADE PYELOGRAM, URETEROSCOPY AND STONE EXTRACTION  DOUBLE J STENT PLACEMENT;  Surgeon: Jerilee Field, MD;  Location: [facility];  Service: Urology;  Laterality: Left;  . CYSTOSCOPY/URETEROSCOPY/HOLMIUM LASER/STENT PLACEMENT Right 10/16/2015   Procedure: RIGHT URETEROSCOPY/HOLMIUM LASER/STENT PLACEMENT;  Surgeon: Jerilee Field, MD;  [facility];  Service: Urology;  Laterality: Right;  . HOLMIUM LASER APPLICATION Left 09/09/2014   Procedure: HOLMIUM LASER APPLICATION;  Surgeon: Jerilee Field, MD;  Location: [facility];  Service: Urology;  Laterality: Left;  . HOLMIUM LASER APPLICATION Right 10/16/2015   Procedure: HOLMIUM LASER APPLICATION;  Surgeon: Jerilee Field, MD;  Location: [facility];  Service: Urology;  Laterality: Right;  . STONE EXTRACTION WITH BASKET Right 10/16/2015   Procedure: STONE EXTRACTION WITH BASKET;  Surgeon: Jerilee Field, MD;  Location: [facility];  Service: Urology;  Laterality: Right;  . WISDOM TOOTH EXTRACTION  03/2014    SOCIAL HISTORY: Social History   Tobacco Use  . Smoking status: Never Smoker  . Smokeless tobacco: Never Used  Substance Use Topics  . Alcohol use: No  . Drug use: No    FAMILY HISTORY: Family History  Problem Relation Age of Onset  . Hypertension Mother   . Obesity Mother   . Diabetes Father   . Kidney disease Father   . Sudden death Father     ROS: Review of Systems  Constitutional: Positive for weight loss.    Gastrointestinal: Negative for nausea and vomiting.  Musculoskeletal:       Negative muscle weakness    PHYSICAL EXAM: Blood pressure 123/79, pulse 79, temperature 98.4 F (36.9 C), temperature source Oral, height 5\' 2"  (1.575 m), weight 164 lb (74.4 kg), SpO2 99 %. Body mass index is 30 kg/m. Physical Exam  Constitutional: She is oriented to person, place, and time. She appears well-developed and well-nourished.  Cardiovascular: Normal rate.  Pulmonary/Chest: Effort normal.  Musculoskeletal: Normal range of motion.  Neurological: She is oriented to person, place, and time.  Skin: Skin is warm and dry.  Psychiatric: She has a normal mood and affect. Her behavior is normal.  Vitals reviewed.   RECENT LABS AND TESTS: BMET    Component Value Date/Time   NA 140 05/30/2017 1058   K 4.8 05/30/2017  1058   CL 103 05/30/2017 1058   CO2 22 05/30/2017 1058   GLUCOSE 88 05/30/2017 1058   GLUCOSE 119 (H) 09/29/2015 0514   BUN 13 05/30/2017 1058   CREATININE 0.74 05/30/2017 1058   CALCIUM 9.3 05/30/2017 1058   GFRNONAA 107 05/30/2017 1058   GFRAA 123 05/30/2017 1058   Lab Results  Component Value Date   HGBA1C 5.7 (H) 05/30/2017   HGBA1C 6.1 (H) 03/13/2017   Lab Results  Component Value Date   INSULIN 28.3 (H) 05/30/2017   INSULIN 44.5 (H) 03/13/2017   CBC    Component Value Date/Time   WBC 12.9 (H) 03/13/2017 1119   WBC 14.9 (H) 09/29/2015 0514   RBC 5.48 (H) 03/13/2017 1119   RBC 4.34 09/29/2015 0514   HGB 14.2 03/13/2017 1119   HCT 43.9 03/13/2017 1119   PLT 414 (H) 10/05/2015 0909   MCV 80 03/13/2017 1119   MCH 25.9 (L) 03/13/2017 1119   MCH 25.8 (L) 09/29/2015 0514   MCHC 32.3 03/13/2017 1119   MCHC 32.7 09/29/2015 0514   RDW 15.2 03/13/2017 1119   LYMPHSABS 2.7 03/13/2017 1119   MONOABS 0.6 09/28/2015 0929   EOSABS 0.5 (H) 03/13/2017 1119   BASOSABS 0.0 03/13/2017 1119   Iron/TIBC/Ferritin/ %Sat No results found for: IRON, TIBC, FERRITIN,  IRONPCTSAT Lipid Panel     Component Value Date/Time   CHOL 136 05/30/2017 1058   TRIG 221 (H) 05/30/2017 1058   HDL 42 05/30/2017 1058   LDLCALC 50 05/30/2017 1058   Hepatic Function Panel     Component Value Date/Time   PROT 7.1 05/30/2017 1058   ALBUMIN 4.2 05/30/2017 1058   AST 13 05/30/2017 1058   ALT 13 05/30/2017 1058   ALKPHOS 51 05/30/2017 1058   BILITOT 0.2 05/30/2017 1058      Component Value Date/Time   TSH 1.720 03/13/2017 1119    ASSESSMENT AND PLAN: Vitamin D deficiency  Class 1 obesity with serious comorbidity and body mass index (BMI) of 30.0 to 30.9 in adult, unspecified obesity type  PLAN:  Vitamin D Deficiency Lauren Cardenas was informed that low vitamin D levels contributes to fatigue and are associated with obesity, breast, and colon cancer. She agrees to continue to take prescription Vit D @50 ,000 IU every week. We will recheck labs in 6 weeks and will follow up for routine testing of vitamin D, at least 2-3 times per year. She was informed of the risk of over-replacement of vitamin D and agrees to not increase her dose unless she discusses this with [place] first.  We spent > than 50% of the 15 minute visit on the counseling as documented in the note.  Obesity Lauren Cardenas is currently in the action stage of change. As such, her goal is to continue with weight loss efforts She has agreed to keep a food journal with 400 to 550 calories and 40+ grams of protein at supper daily and follow the Category 3 plan Lauren Cardenas has been instructed to work up to a goal of 150 minutes of combined cardio and strengthening exercise per week or start walking or do walking tapes for 20 minutes 3 times per week for weight loss and overall health benefits. We discussed the following Behavioral Modification Strategies today: increasing lean protein intake, work on meal planning and easy cooking plans and ways to avoid boredom eating  Lauren Cardenas has agreed to follow up with our clinic in 2  weeks. She was informed of the importance of frequent follow up  visits to maximize her success with intensive lifestyle modifications for her multiple health conditions.   OBESITY BEHAVIORAL INTERVENTION VISIT  Today's visit was # 9 out of 22.  Starting weight: 181 lbs Starting date: 03/13/17 Today's weight : 164 lbs Today's date: 07/19/2017 Total lbs lost to date: 17 (Patients must lose 7 lbs in the first 6 months to continue with counseling)   ASK: We discussed the diagnosis of obesity with Lauren Cardenas today and Lauren Cardenas agreed to give us permission to discuss obesity behavioral modification therapy today.  ASSESS: Noelani has the diagnosis of obesity and her BMI today is 29.99 Lauren Cardenas is in the action stage of change   ADVISE: Lauren Cardenas was educated on the multiple health risks of obesity as well as the benefit of weight loss to improve her health. She was advised of the need for long term treatment and the importance of lifestyle modifications.  AGREE: Multiple dietary modification options and treatment options were discussed and  Lauren Cardenas agreed to the above obesity treatment plan.  I, [place] CraneJoanne Murray, am acting as transcriptionist for Quillian Quincearen Leodan Bolyard, MD  I have reviewed the above documentation for accuracy and completeness, and I agree with the above. -Quillian Quincearen Matayah Reyburn, MD

## 2017-07-27 ENCOUNTER — Other Ambulatory Visit (INDEPENDENT_AMBULATORY_CARE_PROVIDER_SITE_OTHER): Payer: Self-pay | Admitting: Family Medicine

## 2017-07-27 DIAGNOSIS — E559 Vitamin D deficiency, unspecified: Secondary | ICD-10-CM

## 2017-07-27 DIAGNOSIS — F3289 Other specified depressive episodes: Secondary | ICD-10-CM

## 2017-07-27 DIAGNOSIS — R7303 Prediabetes: Secondary | ICD-10-CM

## 2017-08-02 ENCOUNTER — Other Ambulatory Visit (INDEPENDENT_AMBULATORY_CARE_PROVIDER_SITE_OTHER): Payer: Self-pay | Admitting: Family Medicine

## 2017-08-02 DIAGNOSIS — E559 Vitamin D deficiency, unspecified: Secondary | ICD-10-CM

## 2017-08-02 DIAGNOSIS — F3289 Other specified depressive episodes: Secondary | ICD-10-CM

## 2017-08-02 DIAGNOSIS — R7303 Prediabetes: Secondary | ICD-10-CM

## 2017-08-04 ENCOUNTER — Ambulatory Visit: Payer: Medicaid Other | Admitting: Pediatrics

## 2017-08-04 ENCOUNTER — Encounter: Payer: Self-pay | Admitting: Pediatrics

## 2017-08-04 ENCOUNTER — Other Ambulatory Visit (INDEPENDENT_AMBULATORY_CARE_PROVIDER_SITE_OTHER): Payer: Self-pay | Admitting: Family Medicine

## 2017-08-04 VITALS — BP 115/76 | HR 80 | Temp 98.5°F | Ht 62.0 in | Wt 170.4 lb

## 2017-08-04 DIAGNOSIS — F3289 Other specified depressive episodes: Secondary | ICD-10-CM

## 2017-08-04 DIAGNOSIS — E559 Vitamin D deficiency, unspecified: Secondary | ICD-10-CM

## 2017-08-04 DIAGNOSIS — M62838 Other muscle spasm: Secondary | ICD-10-CM

## 2017-08-04 DIAGNOSIS — R7303 Prediabetes: Secondary | ICD-10-CM

## 2017-08-04 MED ORDER — CYCLOBENZAPRINE HCL 5 MG PO TABS: 5.0000 mg | ORAL_TABLET | Freq: Three times a day (TID) | ORAL | 0 refills | Status: DC | PRN

## 2017-08-04 NOTE — Patient Instructions (Signed)
Aleve twice a day Gentle neck range of motion Take muscle relaxer if needed, can cause drowsiness

## 2017-08-04 NOTE — Progress Notes (Signed)
  Subjective:   Patient ID: Lauren Cardenas, female    DOB: 21-Feb-1986, 32 y.o.   MRN: [id number] CC: headache, neck tightness HPI: Lauren Cardenas is a 32 y.o. female presenting for Neck Pain  Started three days ago Works with a massage therapist, was told R side of her neck was tight Pt thinks it is giving her HA No fevers Some stretching feeling when she turns her head back and forth, more pain on L side of neck when she turns her head to the R  No ear pain Normal appetite Doesn't think she has done anything unusual recently, no known injuries  Relevant past medical, surgical, family and social history reviewed. Allergies and medications reviewed and updated. Social History   Tobacco Use  Smoking Status Never Smoker  Smokeless Tobacco Never Used   ROS: Per HPI   Objective:    BP 115/76   Pulse 80   Temp 98.5 F (36.9 C) (Oral)   Ht 5\' 2"  (1.575 m)   Wt 170 lb 6.4 oz (77.3 kg)   BMI 31.17 kg/m   Wt Readings from Last 3 Encounters:  08/04/17 170 lb 6.4 oz (77.3 kg)  07/19/17 164 lb (74.4 kg)  07/04/17 167 lb (75.8 kg)    Gen: NAD, alert, cooperative with exam, NCAT EYES: EOMI, no conjunctival injection, or no icterus ENT:  TMs pearly gray b/l, OP without erythema LYMPH: no cervical LAD CV: NRRR, normal S1/S2, no murmur, distal pulses 2+ b/l Resp: CTABL, no wheezes, normal WOB Neuro: Alert and oriented, strength equal b/l UE and LE, coordination grossly normal MSK: ttp along SCM/trapezius L side and posterior neck. Slightly decreased ROM neck turning and tilting to R side due to pain  Assessment & Plan:  Lauren Cardenas was seen today for neck pain, edema and headache.  Diagnoses and all orders for this visit:  Neck muscle spasm NSAIDs, rest, ice, heat, gentle neck ROM, take below if needed, do not take and drive, will cause sleepiness -     cyclobenzaprine (FLEXERIL) 5 MG tablet; Take 1 tablet (5 mg total) by mouth 3 (three) times daily as needed for muscle  spasms.   Follow up plan: Return if symptoms worsen or fail to improve. Rex Krasarol Charday Capetillo, MD [facility]

## 2017-08-06 ENCOUNTER — Encounter: Payer: Self-pay | Admitting: Pediatrics

## 2017-08-07 ENCOUNTER — Ambulatory Visit (INDEPENDENT_AMBULATORY_CARE_PROVIDER_SITE_OTHER): Payer: Medicaid Other | Admitting: Physician Assistant

## 2017-08-07 VITALS — BP 127/86 | HR 76 | Temp 98.5°F | Ht 62.0 in | Wt 166.0 lb

## 2017-08-07 DIAGNOSIS — F3289 Other specified depressive episodes: Secondary | ICD-10-CM

## 2017-08-07 DIAGNOSIS — R7303 Prediabetes: Secondary | ICD-10-CM | POA: Diagnosis not present

## 2017-08-07 DIAGNOSIS — Z6830 Body mass index (BMI) 30.0-30.9, adult: Secondary | ICD-10-CM

## 2017-08-07 DIAGNOSIS — Z9189 Other specified personal risk factors, not elsewhere classified: Secondary | ICD-10-CM | POA: Diagnosis not present

## 2017-08-07 DIAGNOSIS — E559 Vitamin D deficiency, unspecified: Secondary | ICD-10-CM | POA: Diagnosis not present

## 2017-08-07 DIAGNOSIS — E669 Obesity, unspecified: Secondary | ICD-10-CM

## 2017-08-07 MED ORDER — BUPROPION HCL ER (SR) 150 MG PO TB12: 150.0000 mg | ORAL_TABLET | Freq: Every day | ORAL | 0 refills | Status: DC

## 2017-08-07 MED ORDER — METFORMIN HCL 500 MG PO TABS: 500.0000 mg | ORAL_TABLET | Freq: Every day | ORAL | 0 refills | Status: DC

## 2017-08-07 MED ORDER — VITAMIN D (ERGOCALCIFEROL) 1.25 MG (50000 UNIT) PO CAPS: 50000.0000 [IU] | ORAL_CAPSULE | ORAL | 0 refills | Status: DC

## 2017-08-07 NOTE — Progress Notes (Signed)
Office: [phone]  /  Fax: [phone]   HPI:   Chief Complaint: OBESITY Lauren Cardenas is here to discuss her progress with her obesity treatment plan. She is on the keep a food journal with 400 to 550 calories and 40+ grams of protein at supper daily and follow the Category 3 plan and is following her eating plan approximately 50 % of the time. She states she is exercising 0 minutes 0 times per week. [name] includes variety with her dinner and states she stays within her calorie and protein range, however she does not journal her meals. [name] was advised on staying on the structured meal plan and keep a strict food journal for dinner. Her weight is 166 lb (75.3 kg) today and has had a weight loss of 2 pounds over a period of 2 weeks since her last visit. She has lost 15 lbs since starting treatment with Lauren Cardenas.  Pre-Diabetes [name] has a diagnosis of pre-diabetes based on her elevated Hgb A1c and was informed this puts her at greater risk of developing diabetes. She is taking metformin currently and continues to work on diet and exercise to decrease risk of diabetes. She denies nausea, polyphagia or hypoglycemia.  At risk for diabetes Lauren Cardenas is at higher than average risk for developing diabetes due to her obesity and pre-diabetes. She currently denies polyuria or polydipsia.  Vitamin D deficiency Lauren Cardenas has a diagnosis of vitamin D deficiency. She is currently taking vit D and denies nausea, vomiting or muscle weakness.   Ref. Range 05/30/2017 10:58  Vitamin D, 25-Hydroxy Latest Ref Range: 30.0 - 100.0 ng/mL 65.6   Depression with emotional eating behaviors Lauren Cardenas is struggling with emotional eating and using food for comfort to the extent that it is negatively impacting her health. She often snacks when she is not hungry. Lauren Cardenas sometimes feels she is out of control and then feels guilty that she made poor food choices. She has been working on behavior modification techniques to help  reduce her emotional eating and has been somewhat successful. Her mood is stable and she shows no sign of suicidal or homicidal ideations.  Depression screen [facility] 2/9 08/04/2017 05/10/2017 03/13/2017 02/14/2017 08/09/2016  Decreased Interest 0 0 1 0 0  Down, Depressed, Hopeless 0 0 1 0 0  PHQ - 2 Score 0 0 2 0 0  Altered sleeping - - 3 - -  Tired, decreased energy - - 3 - -  Change in appetite - - 2 - -  Feeling bad or failure about yourself  - - 1 - -  Trouble concentrating - - 1 - -  Moving slowly or fidgety/restless - - 1 - -  Suicidal thoughts - - 0 - -  PHQ-9 Score - - 13 - -  Difficult doing work/chores - - Very difficult - -     ALLERGIES: Allergies  Allergen Reactions  . Amoxicillin Hives    MEDICATIONS: Current Outpatient Medications on File Prior to Visit  Medication Sig Dispense Refill  . acetaminophen (TYLENOL) 500 MG tablet Take 1,000 mg by mouth every 6 (six) hours as needed.    . benzonatate (TESSALON) 200 MG capsule Take 1 capsule (200 mg total) 2 (two) times daily as needed by mouth for cough. 20 capsule 0  . buPROPion (WELLBUTRIN SR) 150 MG 12 hr tablet Take 1 tablet (150 mg total) by mouth daily. 30 tablet 0  . cetirizine (ZYRTEC ALLERGY) 10 MG tablet Take 1 tablet (10 mg total) daily by mouth. 90  tablet 0  . CRANBERRY PO Take 500 mg by mouth daily.    . cyclobenzaprine (FLEXERIL) 5 MG tablet Take 1 tablet (5 mg total) by mouth 3 (three) times daily as needed for muscle spasms. 15 tablet 0  . cycloSPORINE (RESTASIS) 0.05 % ophthalmic emulsion Place 1 drop into both eyes 2 (two) times daily.    . fluticasone (FLONASE) 50 MCG/ACT nasal spray Place 2 sprays as needed into both nostrils for allergies or rhinitis. 16 g 0  . HYDROcodone-acetaminophen (NORCO) 7.5-325 MG tablet Take 1 tablet by mouth every 6 (six) hours as needed for moderate pain.    Marland Kitchen ibuprofen (ADVIL,MOTRIN) 200 MG tablet Take 400-800 mg by mouth 3 (three) times daily.     Marland Kitchen ketorolac (TORADOL) 10 MG  tablet Take 10 mg by mouth every 6 (six) hours as needed.    Marland Kitchen levonorgestrel-ethinyl estradiol (AVIANE,ALESSE,LESSINA) 0.1-20 MG-MCG tablet Take 1 tablet by mouth daily.    . metFORMIN (GLUCOPHAGE) 500 MG tablet Take 1 tablet (500 mg total) by mouth daily with breakfast. 30 tablet 0  . Multiple Vitamins-Minerals (CENTRUM MULTIGUMMIES) CHEW Chew 1 tablet by mouth daily.    . Olopatadine HCl (PAZEO) 0.7 % SOLN Apply 1 drop to eye daily.    . promethazine (PHENERGAN) 25 MG tablet Take 1 tablet (25 mg total) as needed by mouth for nausea or vomiting. 30 tablet 11  . propranolol ER (INDERAL LA) 120 MG 24 hr capsule Take 1 capsule (120 mg total) at bedtime by mouth. 90 capsule 4  . rizatriptan (MAXALT-MLT) 10 MG disintegrating tablet Take 1 tablet (10 mg total) 3 (three) times daily as needed by mouth for migraine. 9 tablet 11  . tamsulosin (FLOMAX) 0.4 MG CAPS capsule Take 0.4 mg by mouth as needed.    . Vitamin D, Ergocalciferol, (DRISDOL) 50000 units CAPS capsule Take 1 capsule (50,000 Units total) by mouth every 7 (seven) days. 4 capsule 0   No current facility-administered medications on file prior to visit.     PAST MEDICAL HISTORY: Past Medical History:  Diagnosis Date  . Back pain   . Chronic migraine   . Constipation   . Dysuria   . GERD (gastroesophageal reflux disease)   . History of acute pyelonephritis    09-27-2015  . History of kidney stones   . HTN (hypertension)   . Interstitial cystitis   . Neck pain   . Nephrolithiasis    bilateral  non-obstructive per ct 09-27-2015  . OAB (overactive bladder)   . Obesity   . Right ureteral stone   . Shoulder pain   . Urgency of urination   . Vesicoureteral reflux     PAST SURGICAL HISTORY: Past Surgical History:  Procedure Laterality Date  . CYSTOSCOPY W/ URETERAL STENT PLACEMENT Right 09/27/2015   Procedure: CYSTOSCOPY WITH RETROGRADE PYELOGRAM/URETERAL STENT PLACEMENT;  Surgeon: Hildred Laser, MD;  Location: [facility];   Service: Urology;  Laterality: Right;  . CYSTOSCOPY WITH RETROGRADE PYELOGRAM, URETEROSCOPY AND STENT PLACEMENT Left 09/09/2014   Procedure: CYSTOSCOPY WITH LEFT RETROGRADE PYELOGRAM, URETEROSCOPY AND STONE EXTRACTION  DOUBLE J STENT PLACEMENT;  Surgeon: Jerilee Field, MD;  Location: [facility];  Service: Urology;  Laterality: Left;  . CYSTOSCOPY/URETEROSCOPY/HOLMIUM LASER/STENT PLACEMENT Right 10/16/2015   Procedure: RIGHT URETEROSCOPY/HOLMIUM LASER/STENT PLACEMENT;  Surgeon: Jerilee Field, MD;  Location: [facility];  Service: Urology;  Laterality: Right;  . HOLMIUM LASER APPLICATION Left 09/09/2014   Procedure: HOLMIUM LASER APPLICATION;  Surgeon: Jerilee Field, MD;  Location:  [facility];  Service: Urology;  Laterality: Left;  . HOLMIUM LASER APPLICATION Right 10/16/2015   Procedure: HOLMIUM LASER APPLICATION;  Surgeon: Jerilee Field, MD;  Location: [facility];  Service: Urology;  Laterality: Right;  . STONE EXTRACTION WITH BASKET Right 10/16/2015   Procedure: STONE EXTRACTION WITH BASKET;  Surgeon: Jerilee Field, MD;  Location: [facility];  Service: Urology;  Laterality: Right;  . WISDOM TOOTH EXTRACTION  03/2014    SOCIAL HISTORY: Social History   Tobacco Use  . Smoking status: Never Smoker  . Smokeless tobacco: Never Used  Substance Use Topics  . Alcohol use: No  . Drug use: No    FAMILY HISTORY: Family History  Problem Relation Age of Onset  . Hypertension Mother   . Obesity Mother   . Diabetes Father   . Kidney disease Father   . Sudden death Father     ROS: Review of Systems  Constitutional: Negative for weight loss.  Gastrointestinal: Negative for nausea and vomiting.  Genitourinary: Negative for frequency.  Musculoskeletal:       Negative for muscle weakness  Endo/Heme/Allergies: Negative for polydipsia.       Negative for polyphagia Negative for hypoglycemia  Psychiatric/Behavioral: Positive for depression.  Negative for suicidal ideas.    PHYSICAL EXAM: Blood pressure 127/86, pulse 76, temperature 98.5 F (36.9 C), temperature source Oral, height 5\' 2"  (1.575 m), weight 166 lb (75.3 kg), SpO2 100 %. Body mass index is 30.36 kg/m. Physical Exam  Constitutional: She is oriented to person, place, and time. She appears well-developed and well-nourished.  Cardiovascular: Normal rate.  Pulmonary/Chest: Effort normal.  Musculoskeletal: Normal range of motion.  Neurological: She is oriented to person, place, and time.  Skin: Skin is warm and dry.  Psychiatric: She has a normal mood and affect. Her behavior is normal.  Vitals reviewed.   RECENT LABS AND TESTS: BMET    Component Value Date/Time   NA 140 05/30/2017 1058   K 4.8 05/30/2017 1058   CL 103 05/30/2017 1058   CO2 22 05/30/2017 1058   GLUCOSE 88 05/30/2017 1058   GLUCOSE 119 (H) 09/29/2015 0514   BUN 13 05/30/2017 1058   CREATININE 0.74 05/30/2017 1058   CALCIUM 9.3 05/30/2017 1058   GFRNONAA 107 05/30/2017 1058   GFRAA 123 05/30/2017 1058   Lab Results  Component Value Date   HGBA1C 5.7 (H) 05/30/2017   HGBA1C 6.1 (H) 03/13/2017   Lab Results  Component Value Date   INSULIN 28.3 (H) 05/30/2017   INSULIN 44.5 (H) 03/13/2017   CBC    Component Value Date/Time   WBC 12.9 (H) 03/13/2017 1119   WBC 14.9 (H) 09/29/2015 0514   RBC 5.48 (H) 03/13/2017 1119   RBC 4.34 09/29/2015 0514   HGB 14.2 03/13/2017 1119   HCT 43.9 03/13/2017 1119   PLT 414 (H) 10/05/2015 0909   MCV 80 03/13/2017 1119   MCH 25.9 (L) 03/13/2017 1119   MCH 25.8 (L) 09/29/2015 0514   MCHC 32.3 03/13/2017 1119   MCHC 32.7 09/29/2015 0514   RDW 15.2 03/13/2017 1119   LYMPHSABS 2.7 03/13/2017 1119   MONOABS 0.6 09/28/2015 0929   EOSABS 0.5 (H) 03/13/2017 1119   BASOSABS 0.0 03/13/2017 1119   Iron/TIBC/Ferritin/ %Sat No results found for: IRON, TIBC, FERRITIN, IRONPCTSAT Lipid Panel     Component Value Date/Time   CHOL 136 05/30/2017 1058    TRIG 221 (H) 05/30/2017 1058   HDL 42 05/30/2017 1058  LDLCALC 50 05/30/2017 1058   Hepatic Function Panel     Component Value Date/Time   PROT 7.1 05/30/2017 1058   ALBUMIN 4.2 05/30/2017 1058   AST 13 05/30/2017 1058   ALT 13 05/30/2017 1058   ALKPHOS 51 05/30/2017 1058   BILITOT 0.2 05/30/2017 1058      Component Value Date/Time   TSH 1.720 03/13/2017 1119    Ref. Range 05/30/2017 10:58  Vitamin D, 25-Hydroxy Latest Ref Range: 30.0 - 100.0 ng/mL 65.6   ASSESSMENT AND PLAN: Prediabetes - Plan: metFORMIN (GLUCOPHAGE) 500 MG tablet  Vitamin D deficiency - Plan: Vitamin D, Ergocalciferol, (DRISDOL) 50000 units CAPS capsule  Other depression - with emotional eating - Plan: buPROPion (WELLBUTRIN SR) 150 MG 12 hr tablet  At risk for diabetes mellitus  Class 1 obesity with serious comorbidity and body mass index (BMI) of 30.0 to 30.9 in adult, unspecified obesity type  PLAN:  Pre-Diabetes Ramatoulaye will continue to work on weight loss, exercise, and decreasing simple carbohydrates in her diet to help decrease the risk of diabetes. We dicussed metformin including benefits and risks. She was informed that eating too many simple carbohydrates or too many calories at one sitting increases the likelihood of GI side effects. Emmersen requested metformin for now and a prescription was written today for 1 month refill. Nataliee agreed to follow up with Lauren Cardenas as directed to monitor her progress.  Diabetes risk counseling Caleen was given extended (15 minutes) diabetes prevention counseling today. She is 32 y.o. female and has risk factors for diabetes including obesity and pre-diabetes. We discussed intensive lifestyle modifications today with an emphasis on weight loss as well as increasing exercise and decreasing simple carbohydrates in her diet.  Vitamin D Deficiency Telecia was informed that low vitamin D levels contributes to fatigue and are associated with obesity, breast, and colon  cancer. She agrees to continue to take prescription Vit D @50 ,000 IU every week #4 with no refills and will follow up for routine testing of vitamin D, at least 2-3 times per year. She was informed of the risk of over-replacement of vitamin D and agrees to not increase her dose unless she discusses this with Lauren Cardenas first. Viera agrees to follow up with our clinic in 2 weeks.  Depression with Emotional Eating Behaviors We discussed behavior modification techniques today to help Zuleima deal with her emotional eating and depression. She has agreed to take Wellbutrin SR 150 mg qd #30 with no refills and follow up as directed.  Obesity Bev is currently in the action stage of change. As such, her goal is to continue with weight loss efforts She has agreed to keep a food journal with 400 to 550 calories and 40+ grams of protein at supper and follow the Category 3 plan Terrika has been instructed to work up to a goal of 150 minutes of combined cardio and strengthening exercise per week for weight loss and overall health benefits. We discussed the following Behavioral Modification Strategies today: increasing lean protein intake and keep a strict food journal  Margret has agreed to follow up with our clinic in 2 weeks. She was informed of the importance of frequent follow up visits to maximize her success with intensive lifestyle modifications for her multiple health conditions.   OBESITY BEHAVIORAL INTERVENTION VISIT  Today's visit was # 10 out of 22.  Starting weight: 181 lbs Starting date: 03/13/17 Today's weight : 166 lbs Today's date: 08/07/2017 Total lbs lost to date: 15 (Patients must lose  7 lbs in the first 6 months to continue with counseling)   ASK: We discussed the diagnosis of obesity with Mindi Curlingachael D Teichert today and Lashauna agreed to give us permission to discuss obesity behavioral modification therapy today.  ASSESS: Valisha has the diagnosis of obesity and her BMI today is  30.35 Karrah is in the action stage of change   ADVISE: Mumtaz was educated on the multiple health risks of obesity as well as the benefit of weight loss to improve her health. She was advised of the need for long term treatment and the importance of lifestyle modifications.  AGREE: Multiple dietary modification options and treatment options were discussed and  Amos agreed to the above obesity treatment plan.   Cristi LoronI, Joanne Murray, am acting as transcriptionist for [employer]ar Orlan [employer], PA-C I, Illa LevelSahar Pretty Weltman [facility], have reviewed this note and agree with its content.

## 2017-08-17 ENCOUNTER — Ambulatory Visit (INDEPENDENT_AMBULATORY_CARE_PROVIDER_SITE_OTHER): Payer: Medicaid Other | Admitting: *Deleted

## 2017-08-17 DIAGNOSIS — Z23 Encounter for immunization: Secondary | ICD-10-CM | POA: Diagnosis not present

## 2017-08-17 NOTE — Progress Notes (Signed)
Pt given flu vaccine Tolerated well 

## 2017-08-21 ENCOUNTER — Encounter (INDEPENDENT_AMBULATORY_CARE_PROVIDER_SITE_OTHER): Payer: Self-pay

## 2017-08-21 ENCOUNTER — Ambulatory Visit (INDEPENDENT_AMBULATORY_CARE_PROVIDER_SITE_OTHER): Payer: Medicaid Other | Admitting: Physician Assistant

## 2017-08-28 ENCOUNTER — Ambulatory Visit (INDEPENDENT_AMBULATORY_CARE_PROVIDER_SITE_OTHER): Payer: Medicaid Other | Admitting: Physician Assistant

## 2017-08-28 VITALS — BP 127/85 | HR 76 | Temp 98.4°F | Ht 62.0 in | Wt 169.0 lb

## 2017-08-28 DIAGNOSIS — E669 Obesity, unspecified: Secondary | ICD-10-CM

## 2017-08-28 DIAGNOSIS — Z9189 Other specified personal risk factors, not elsewhere classified: Secondary | ICD-10-CM | POA: Diagnosis not present

## 2017-08-28 DIAGNOSIS — R7303 Prediabetes: Secondary | ICD-10-CM | POA: Diagnosis not present

## 2017-08-28 DIAGNOSIS — F3289 Other specified depressive episodes: Secondary | ICD-10-CM

## 2017-08-28 DIAGNOSIS — E559 Vitamin D deficiency, unspecified: Secondary | ICD-10-CM | POA: Diagnosis not present

## 2017-08-28 DIAGNOSIS — Z6831 Body mass index (BMI) 31.0-31.9, adult: Secondary | ICD-10-CM

## 2017-08-28 MED ORDER — BUPROPION HCL ER (SR) 150 MG PO TB12: 150.0000 mg | ORAL_TABLET | Freq: Every day | ORAL | 0 refills | Status: DC

## 2017-08-28 MED ORDER — METFORMIN HCL 500 MG PO TABS: 500.0000 mg | ORAL_TABLET | Freq: Every day | ORAL | 0 refills | Status: DC

## 2017-08-28 MED ORDER — VITAMIN D (ERGOCALCIFEROL) 1.25 MG (50000 UNIT) PO CAPS: 50000.0000 [IU] | ORAL_CAPSULE | ORAL | 0 refills | Status: DC

## 2017-08-28 NOTE — Progress Notes (Signed)
Office: [phone]  /  Fax: [phone]   HPI:   Chief Complaint: OBESITY Lauren Cardenas is here to discuss her progress with her obesity treatment plan. She is on the keep a food journal with 400-550 calories and 40+ grams of protein at supper daily and follow the Category 3 plan and is following her eating plan approximately 25 % of the time. She states she is exercising 0 minutes 0 times per week. [name] has not been following the meal plan and has deviated with her eating. She has been taking care of her sick husband and states she does not have healthy food in the home.  Her weight is 169 lb (76.7 kg) today and has gained 3 pounds since her last visit. She has lost 12 lbs since starting treatment with [place].  Pre-Diabetes Lauren Cardenas has a diagnosis of pre-diabetes based on her elevated Hgb A1c and was informed this puts her at greater risk of developing diabetes. She is taking metformin currently and continues to work on diet and exercise to decrease risk of diabetes. She denies polyphagia, nausea or hypoglycemia.  At risk for diabetes Lauren Cardenas is at higher than average risk for developing diabetes due to her obesity and pre-diabetes. She currently denies polyuria or polydipsia.  Vitamin D Deficiency Lauren Cardenas has a diagnosis of vitamin D deficiency. She is currently taking prescription Vit D and denies nausea, vomiting or muscle weakness.  Depression with emotional eating behaviors Lauren Cardenas is struggling with emotional eating and using food for comfort to the extent that it is negatively impacting her health. She often snacks when she is not hungry. Lauren Cardenas sometimes feels she is out of control and then feels guilty that she made poor food choices. She has been working on behavior modification techniques to help reduce her emotional eating and has been somewhat successful. Her mood is stable and she shows no sign of suicidal or homicidal ideations.  Depression screen [facility] 2/9 08/04/2017 05/10/2017  03/13/2017 02/14/2017 08/09/2016  Decreased Interest 0 0 1 0 0  Down, Depressed, Hopeless 0 0 1 0 0  PHQ - 2 Score 0 0 2 0 0  Altered sleeping - - 3 - -  Tired, decreased energy - - 3 - -  Change in appetite - - 2 - -  Feeling bad or failure about yourself  - - 1 - -  Trouble concentrating - - 1 - -  Moving slowly or fidgety/restless - - 1 - -  Suicidal thoughts - - 0 - -  PHQ-9 Score - - 13 - -  Difficult doing work/chores - - Very difficult - -   ALLERGIES: Allergies  Allergen Reactions  . Sudafed [Pseudoephedrine Hcl] Shortness Of Breath  . Amoxicillin Hives    MEDICATIONS: Current Outpatient Medications on File Prior to Visit  Medication Sig Dispense Refill  . acetaminophen (TYLENOL) 500 MG tablet Take 1,000 mg by mouth every 6 (six) hours as needed.    Marland Kitchen buPROPion (WELLBUTRIN SR) 150 MG 12 hr tablet Take 1 tablet (150 mg total) by mouth daily. 30 tablet 0  . cetirizine (ZYRTEC ALLERGY) 10 MG tablet Take 1 tablet (10 mg total) daily by mouth. 90 tablet 0  . CRANBERRY PO Take 500 mg by mouth daily.    . cyclobenzaprine (FLEXERIL) 5 MG tablet Take 1 tablet (5 mg total) by mouth 3 (three) times daily as needed for muscle spasms. 15 tablet 0  . cycloSPORINE (RESTASIS) 0.05 % ophthalmic emulsion Place 1 drop into both eyes 2 (  two) times daily.    . fluticasone (FLONASE) 50 MCG/ACT nasal spray Place 2 sprays as needed into both nostrils for allergies or rhinitis. 16 g 0  . HYDROcodone-acetaminophen (NORCO) 7.5-325 MG tablet Take 1 tablet by mouth every 6 (six) hours as needed for moderate pain.    Marland Kitchen. ibuprofen (ADVIL,MOTRIN) 200 MG tablet Take 400-800 mg by mouth 3 (three) times daily.     Marland Kitchen. ketorolac (TORADOL) 10 MG tablet Take 10 mg by mouth every 6 (six) hours as needed.    Marland Kitchen. levonorgestrel-ethinyl estradiol (AVIANE,ALESSE,LESSINA) 0.1-20 MG-MCG tablet Take 1 tablet by mouth daily.    . metFORMIN (GLUCOPHAGE) 500 MG tablet Take 1 tablet (500 mg total) by mouth daily with breakfast. 30  tablet 0  . Multiple Vitamins-Minerals (CENTRUM MULTIGUMMIES) CHEW Chew 1 tablet by mouth daily.    . Olopatadine HCl (PAZEO) 0.7 % SOLN Apply 1 drop to eye daily.    . promethazine (PHENERGAN) 25 MG tablet Take 1 tablet (25 mg total) as needed by mouth for nausea or vomiting. 30 tablet 11  . propranolol ER (INDERAL LA) 120 MG 24 hr capsule Take 1 capsule (120 mg total) at bedtime by mouth. 90 capsule 4  . rizatriptan (MAXALT-MLT) 10 MG disintegrating tablet Take 1 tablet (10 mg total) 3 (three) times daily as needed by mouth for migraine. 9 tablet 11  . tamsulosin (FLOMAX) 0.4 MG CAPS capsule Take 0.4 mg by mouth as needed.    . Vitamin D, Ergocalciferol, (DRISDOL) 50000 units CAPS capsule Take 1 capsule (50,000 Units total) by mouth every 7 (seven) days. 4 capsule 0   No current facility-administered medications on file prior to visit.     PAST MEDICAL HISTORY: Past Medical History:  Diagnosis Date  . Back pain   . Chronic migraine   . Constipation   . Dysuria   . GERD (gastroesophageal reflux disease)   . History of acute pyelonephritis    09-27-2015  . History of kidney stones   . HTN (hypertension)   . Interstitial cystitis   . Neck pain   . Nephrolithiasis    bilateral  non-obstructive per ct 09-27-2015  . OAB (overactive bladder)   . Obesity   . Right ureteral stone   . Shoulder pain   . Urgency of urination   . Vesicoureteral reflux     PAST SURGICAL HISTORY: Past Surgical History:  Procedure Laterality Date  . CYSTOSCOPY W/ URETERAL STENT PLACEMENT Right 09/27/2015   Procedure: CYSTOSCOPY WITH RETROGRADE PYELOGRAM/URETERAL STENT PLACEMENT;  Surgeon: Hildred LaserBrian James Budzyn, MD;  Location: [facility];  Service: Urology;  Laterality: Right;  . CYSTOSCOPY WITH RETROGRADE PYELOGRAM, URETEROSCOPY AND STENT PLACEMENT Left 09/09/2014   Procedure: CYSTOSCOPY WITH LEFT RETROGRADE PYELOGRAM, URETEROSCOPY AND STONE EXTRACTION  DOUBLE J STENT PLACEMENT;  Surgeon: Jerilee FieldMatthew Eskridge, MD;   Location: [facility];  Service: Urology;  Laterality: Left;  . CYSTOSCOPY/URETEROSCOPY/HOLMIUM LASER/STENT PLACEMENT Right 10/16/2015   Procedure: RIGHT URETEROSCOPY/HOLMIUM LASER/STENT PLACEMENT;  Surgeon: Jerilee FieldMatthew Eskridge, MD;  [facility] [place];  Service: Urology;  Laterality: Right;  . HOLMIUM LASER APPLICATION Left 09/09/2014   Procedure: HOLMIUM LASER APPLICATION;  Surgeon: Jerilee FieldMatthew Eskridge, MD;  Location: [facility];  Service: Urology;  Laterality: Left;  . HOLMIUM LASER APPLICATION Right 10/16/2015   Procedure: HOLMIUM LASER APPLICATION;  Surgeon: Jerilee FieldMatthew Eskridge, MD;  [facility] [place];  Service: Urology;  Laterality: Right;  . STONE EXTRACTION WITH BASKET Right 10/16/2015   Procedure: STONE EXTRACTION WITH BASKET;  Surgeon: Molli HazardMatthew  Mena Goes, MD;  [facility];  Service: Urology;  Laterality: Right;  . WISDOM TOOTH EXTRACTION  03/2014    SOCIAL HISTORY: Social History   Tobacco Use  . Smoking status: Never Smoker  . Smokeless tobacco: Never Used  Substance Use Topics  . Alcohol use: No  . Drug use: No    FAMILY HISTORY: Family History  Problem Relation Age of Onset  . Hypertension Mother   . Obesity Mother   . Diabetes Father   . Kidney disease Father   . Sudden death Father     ROS: Review of Systems  Constitutional: Negative for weight loss.  Gastrointestinal: Negative for nausea and vomiting.  Genitourinary: Negative for frequency.  Musculoskeletal:       Negative muscle weakness   Endo/Heme/Allergies: Negative for polydipsia.       Negative polyphagia Negative hypoglycemia  Psychiatric/Behavioral: Positive for depression. Negative for suicidal ideas.    PHYSICAL EXAM: Blood pressure 127/85, pulse 76, temperature 98.4 F (36.9 C), temperature source Oral, height 5\' 2"  (1.575 m), weight 169 lb (76.7 kg), SpO2 100 %. Body mass index is 30.91 kg/m. Physical Exam  Constitutional: She is oriented to person,  place, and time. She appears well-developed and well-nourished.  Cardiovascular: Normal rate.  Pulmonary/Chest: Effort normal.  Musculoskeletal: Normal range of motion.  Neurological: She is oriented to person, place, and time.  Skin: Skin is warm and dry.  Psychiatric: She has a normal mood and affect. Her behavior is normal.  Vitals reviewed.   RECENT LABS AND TESTS: BMET    Component Value Date/Time   NA 140 05/30/2017 1058   K 4.8 05/30/2017 1058   CL 103 05/30/2017 1058   CO2 22 05/30/2017 1058   GLUCOSE 88 05/30/2017 1058   GLUCOSE 119 (H) 09/29/2015 0514   BUN 13 05/30/2017 1058   CREATININE 0.74 05/30/2017 1058   CALCIUM 9.3 05/30/2017 1058   GFRNONAA 107 05/30/2017 1058   GFRAA 123 05/30/2017 1058   Lab Results  Component Value Date   HGBA1C 5.7 (H) 05/30/2017   HGBA1C 6.1 (H) 03/13/2017   Lab Results  Component Value Date   INSULIN 28.3 (H) 05/30/2017   INSULIN 44.5 (H) 03/13/2017   CBC    Component Value Date/Time   WBC 12.9 (H) 03/13/2017 1119   WBC 14.9 (H) 09/29/2015 0514   RBC 5.48 (H) 03/13/2017 1119   RBC 4.34 09/29/2015 0514   HGB 14.2 03/13/2017 1119   HCT 43.9 03/13/2017 1119   PLT 414 (H) 10/05/2015 0909   MCV 80 03/13/2017 1119   MCH 25.9 (L) 03/13/2017 1119   MCH 25.8 (L) 09/29/2015 0514   MCHC 32.3 03/13/2017 1119   MCHC 32.7 09/29/2015 0514   RDW 15.2 03/13/2017 1119   LYMPHSABS 2.7 03/13/2017 1119   MONOABS 0.6 09/28/2015 0929   EOSABS 0.5 (H) 03/13/2017 1119   BASOSABS 0.0 03/13/2017 1119   Iron/TIBC/Ferritin/ %Sat No results found for: IRON, TIBC, FERRITIN, IRONPCTSAT Lipid Panel     Component Value Date/Time   CHOL 136 05/30/2017 1058   TRIG 221 (H) 05/30/2017 1058   HDL 42 05/30/2017 1058   LDLCALC 50 05/30/2017 1058   Hepatic Function Panel     Component Value Date/Time   PROT 7.1 05/30/2017 1058   ALBUMIN 4.2 05/30/2017 1058   AST 13 05/30/2017 1058   ALT 13 05/30/2017 1058   ALKPHOS 51 05/30/2017 1058    BILITOT 0.2 05/30/2017 1058      Component  Value Date/Time   TSH 1.720 03/13/2017 1119  Results for ARELIA, VOLPE (MRN [id number]) as of 08/28/2017 11:54  Ref. Range 05/30/2017 10:58  Vitamin D, 25-Hydroxy Latest Ref Range: 30.0 - 100.0 ng/mL 65.6    ASSESSMENT AND PLAN: Prediabetes - Plan: metFORMIN (GLUCOPHAGE) 500 MG tablet  Vitamin D deficiency - Plan: Vitamin D, Ergocalciferol, (DRISDOL) 50000 units CAPS capsule  Other depression - with emotional eating - Plan: buPROPion (WELLBUTRIN SR) 150 MG 12 hr tablet  At risk for diabetes mellitus  Class 1 obesity with serious comorbidity and body mass index (BMI) of 31.0 to 31.9 in adult, unspecified obesity type  PLAN:  Pre-Diabetes Tashya will continue to work on weight loss, exercise, and decreasing simple carbohydrates in her diet to help decrease the risk of diabetes. We dicussed metformin including benefits and risks. She was informed that eating too many simple carbohydrates or too many calories at one sitting increases the likelihood of GI side effects. Olubunmi agrees to continue taking metformin 500 mg q AM #30 and we will refill for 1 month. Vallorie agrees to follow up with our clinic in 2 weeks as directed to monitor her progress.  Diabetes risk counselling Kolette was given extended (15 minutes) diabetes prevention counseling today. She is 32 y.o. female and has risk factors for diabetes including obesity and pre-diabetes. We discussed intensive lifestyle modifications today with an emphasis on weight loss as well as increasing exercise and decreasing simple carbohydrates in her diet.  Vitamin D Deficiency Briggette was informed that low vitamin D levels contributes to fatigue and are associated with obesity, breast, and colon cancer. Torie agrees to continue taking prescription Vit D @50 ,000 IU every week #4 and we will refill for 1 month. She will follow up for routine testing of vitamin D, at least 2-3 times per year. She was  informed of the risk of over-replacement of vitamin D and agrees to not increase her dose unless she discusses this with [place] first. Leara agrees to follow up with our clinic in 2 weeks.  Depression with Emotional Eating Behaviors We discussed behavior modification techniques today to help Dreamer deal with her emotional eating and depression. Pasty agrees to continue taking Wellbutrin SR 150 mg qd #30 and we will refill for 1 month. Ivett agrees to follow up with our clinic in 2 weeks.  Obesity Heath is currently in the action stage of change. As such, her goal is to get back to weightloss efforts  She has agreed to follow the Category 3 plan Prajna has been instructed to work up to a goal of 150 minutes of combined cardio and strengthening exercise per week for weight loss and overall health benefits. We discussed the following Behavioral Modification Strategies today: Keeping healthy foods in the home and planning for success   Alka has agreed to follow up with our clinic in 2 weeks. She was informed of the importance of frequent follow up visits to maximize her success with intensive lifestyle modifications for her multiple health conditions.   OBESITY BEHAVIORAL INTERVENTION VISIT  Today's visit was # 11 out of 22.  Starting weight: 181 lbs Starting date: 03/13/17 Today's weight : 169 lbs Today's date: 08/28/2017 Total lbs lost to date: 12 (Patients must lose 7 lbs in the first 6 months to continue with counseling)   ASK: We discussed the diagnosis of obesity with Mindi Curling today and Masayo agreed to give [place] permission to discuss obesity behavioral modification therapy today.  ASSESS: Velicia  has the diagnosis of obesity and her BMI today is 30.9 Presleigh is in the action stage of change   ADVISE: Wen was educated on the multiple health risks of obesity as well as the benefit of weight loss to improve her health. She was advised of the need for long term  treatment and the importance of lifestyle modifications.  AGREE: Multiple dietary modification options and treatment options were discussed and  Matalynn agreed to the above obesity treatment plan.   Trude Mcburney, am acting as transcriptionist for Illa Level, PA-C I, Illa Level [facility], have reviewed this note and agree with its content.

## 2017-09-11 ENCOUNTER — Ambulatory Visit (INDEPENDENT_AMBULATORY_CARE_PROVIDER_SITE_OTHER): Payer: Medicaid Other | Admitting: Physician Assistant

## 2017-09-11 VITALS — BP 129/85 | HR 74 | Temp 98.4°F | Ht 62.0 in | Wt 170.0 lb

## 2017-09-11 DIAGNOSIS — Z6831 Body mass index (BMI) 31.0-31.9, adult: Secondary | ICD-10-CM | POA: Diagnosis not present

## 2017-09-11 DIAGNOSIS — E669 Obesity, unspecified: Secondary | ICD-10-CM | POA: Diagnosis not present

## 2017-09-11 DIAGNOSIS — R7989 Other specified abnormal findings of blood chemistry: Secondary | ICD-10-CM | POA: Diagnosis not present

## 2017-09-11 DIAGNOSIS — E559 Vitamin D deficiency, unspecified: Secondary | ICD-10-CM

## 2017-09-11 NOTE — Progress Notes (Signed)
Office: [phone]  /  Fax: [phone]   HPI:   Chief Complaint: OBESITY Lauren Cardenas is here to discuss her progress with her obesity treatment plan. She is on the Category 3 plan and is following her eating plan approximately 25 % of the time. She states she is exercising 0 minutes 0 times per week. Lauren Cardenas is continuing to struggle to follow the meal plan and states she would rather try to make smarter choices and control her portions. Her weight is 170 lb (77.1 kg) today and has had a weight gain of 1 pound over a period of 2 weeks since her last visit. She has lost 11 lbs since starting treatment with Lauren Cardenas.  Elevated T4 Lauren Cardenas has elevated T4 and normal TSH/T3. She admits to heat/cold intolerance, diarrhea and palpitations. She has no history of thyroid disease and she has no swelling of her neck. We will repeat labs this visit.  Vitamin D deficiency Lauren Cardenas has a diagnosis of vitamin D deficiency. She is currently taking vit D and denies nausea, vomiting or muscle weakness.  ALLERGIES: Allergies  Allergen Reactions  . Sudafed [Pseudoephedrine Hcl] Shortness Of Breath  . Amoxicillin Hives    MEDICATIONS: Current Outpatient Medications on File Prior to Visit  Medication Sig Dispense Refill  . acetaminophen (TYLENOL) 500 MG tablet Take 1,000 mg by mouth every 6 (six) hours as needed.    Marland Kitchen. buPROPion (WELLBUTRIN SR) 150 MG 12 hr tablet Take 1 tablet (150 mg total) by mouth daily. 30 tablet 0  . cetirizine (ZYRTEC ALLERGY) 10 MG tablet Take 1 tablet (10 mg total) daily by mouth. 90 tablet 0  . CRANBERRY PO Take 500 mg by mouth daily.    . cyclobenzaprine (FLEXERIL) 5 MG tablet Take 1 tablet (5 mg total) by mouth 3 (three) times daily as needed for muscle spasms. 15 tablet 0  . cycloSPORINE (RESTASIS) 0.05 % ophthalmic emulsion Place 1 drop into both eyes 2 (two) times daily.    . fluticasone (FLONASE) 50 MCG/ACT nasal spray Place 2 sprays as needed into both nostrils for allergies  or rhinitis. 16 g 0  . HYDROcodone-acetaminophen (NORCO) 7.5-325 MG tablet Take 1 tablet by mouth every 6 (six) hours as needed for moderate pain.    Marland Kitchen. ibuprofen (ADVIL,MOTRIN) 200 MG tablet Take 400-800 mg by mouth 3 (three) times daily.     Marland Kitchen. ketorolac (TORADOL) 10 MG tablet Take 10 mg by mouth every 6 (six) hours as needed.    Marland Kitchen. levonorgestrel-ethinyl estradiol (AVIANE,ALESSE,LESSINA) 0.1-20 MG-MCG tablet Take 1 tablet by mouth daily.    . metFORMIN (GLUCOPHAGE) 500 MG tablet Take 1 tablet (500 mg total) by mouth daily with breakfast. 30 tablet 0  . Multiple Vitamins-Minerals (CENTRUM MULTIGUMMIES) CHEW Chew 1 tablet by mouth daily.    . Olopatadine HCl (PAZEO) 0.7 % SOLN Apply 1 drop to eye daily.    . promethazine (PHENERGAN) 25 MG tablet Take 1 tablet (25 mg total) as needed by mouth for nausea or vomiting. 30 tablet 11  . propranolol ER (INDERAL LA) 120 MG 24 hr capsule Take 1 capsule (120 mg total) at bedtime by mouth. 90 capsule 4  . rizatriptan (MAXALT-MLT) 10 MG disintegrating tablet Take 1 tablet (10 mg total) 3 (three) times daily as needed by mouth for migraine. 9 tablet 11  . tamsulosin (FLOMAX) 0.4 MG CAPS capsule Take 0.4 mg by mouth as needed.    . Vitamin D, Ergocalciferol, (DRISDOL) 50000 units CAPS capsule Take 1 capsule (50,000 Units  total) by mouth every 7 (seven) days. 4 capsule 0   No current facility-administered medications on file prior to visit.     PAST MEDICAL HISTORY: Past Medical History:  Diagnosis Date  . Back pain   . Chronic migraine   . Constipation   . Dysuria   . GERD (gastroesophageal reflux disease)   . History of acute pyelonephritis    09-27-2015  . History of kidney stones   . HTN (hypertension)   . Interstitial cystitis   . Neck pain   . Nephrolithiasis    bilateral  non-obstructive per ct 09-27-2015  . OAB (overactive bladder)   . Obesity   . Right ureteral stone   . Shoulder pain   . Urgency of urination   . Vesicoureteral reflux       PAST SURGICAL HISTORY: Past Surgical History:  Procedure Laterality Date  . CYSTOSCOPY W/ URETERAL STENT PLACEMENT Right 09/27/2015   Procedure: CYSTOSCOPY WITH RETROGRADE PYELOGRAM/URETERAL STENT PLACEMENT;  Surgeon: Hildred Laser, MD;  Location: [facility];  Service: Urology;  Laterality: Right;  . CYSTOSCOPY WITH RETROGRADE PYELOGRAM, URETEROSCOPY AND STENT PLACEMENT Left 09/09/2014   Procedure: CYSTOSCOPY WITH LEFT RETROGRADE PYELOGRAM, URETEROSCOPY AND STONE EXTRACTION  DOUBLE J STENT PLACEMENT;  Surgeon: Jerilee Field, MD;  Location: [facility];  Service: Urology;  Laterality: Left;  . CYSTOSCOPY/URETEROSCOPY/HOLMIUM LASER/STENT PLACEMENT Right 10/16/2015   Procedure: RIGHT URETEROSCOPY/HOLMIUM LASER/STENT PLACEMENT;  Surgeon: Jerilee Field, MD;  Location: [facility];  Service: Urology;  Laterality: Right;  . HOLMIUM LASER APPLICATION Left 09/09/2014   Procedure: HOLMIUM LASER APPLICATION;  Surgeon: Jerilee Field, MD;  Location: [facility];  Service: Urology;  Laterality: Left;  . HOLMIUM LASER APPLICATION Right 10/16/2015   Procedure: HOLMIUM LASER APPLICATION;  Surgeon: Jerilee Field, MD;  Location: [facility];  Service: Urology;  Laterality: Right;  . STONE EXTRACTION WITH BASKET Right 10/16/2015   Procedure: STONE EXTRACTION WITH BASKET;  Surgeon: Jerilee Field, MD;  Location: [facility];  Service: Urology;  Laterality: Right;  . WISDOM TOOTH EXTRACTION  03/2014    SOCIAL HISTORY: Social History   Tobacco Use  . Smoking status: Never Smoker  . Smokeless tobacco: Never Used  Substance Use Topics  . Alcohol use: No  . Drug use: No    FAMILY HISTORY: Family History  Problem Relation Age of Onset  . Hypertension Mother   . Obesity Mother   . Diabetes Father   . Kidney disease Father   . Sudden death Father     ROS: Review of Systems  Constitutional: Negative for weight loss.  Cardiovascular: Positive for  palpitations.  Gastrointestinal: Positive for diarrhea. Negative for nausea and vomiting.  Musculoskeletal:       Negative for muscle weakness  Endo/Heme/Allergies:       Positive for Heat/Cold Intolerance    PHYSICAL EXAM: Blood pressure 129/85, pulse 74, temperature 98.4 F (36.9 C), temperature source Oral, height 5\' 2"  (1.575 m), weight 170 lb (77.1 kg), SpO2 98 %. Body mass index is 31.09 kg/m. Physical Exam  Constitutional: She is oriented to person, place, and time. She appears well-developed and well-nourished.  Cardiovascular: Normal rate.  Pulmonary/Chest: Effort normal.  Musculoskeletal: Normal range of motion.  Neurological: She is oriented to person, place, and time.  Skin: Skin is warm and dry.  Psychiatric: She has a normal mood and affect. Her behavior is normal.  Vitals reviewed.   RECENT LABS AND TESTS: BMET    Component Value Date/Time  NA 140 05/30/2017 1058   K 4.8 05/30/2017 1058   CL 103 05/30/2017 1058   CO2 22 05/30/2017 1058   GLUCOSE 88 05/30/2017 1058   GLUCOSE 119 (H) 09/29/2015 0514   BUN 13 05/30/2017 1058   CREATININE 0.74 05/30/2017 1058   CALCIUM 9.3 05/30/2017 1058   GFRNONAA 107 05/30/2017 1058   GFRAA 123 05/30/2017 1058   Lab Results  Component Value Date   HGBA1C 5.7 (H) 05/30/2017   HGBA1C 6.1 (H) 03/13/2017   Lab Results  Component Value Date   INSULIN 28.3 (H) 05/30/2017   INSULIN 44.5 (H) 03/13/2017   CBC    Component Value Date/Time   WBC 12.9 (H) 03/13/2017 1119   WBC 14.9 (H) 09/29/2015 0514   RBC 5.48 (H) 03/13/2017 1119   RBC 4.34 09/29/2015 0514   HGB 14.2 03/13/2017 1119   HCT 43.9 03/13/2017 1119   PLT 414 (H) 10/05/2015 0909   MCV 80 03/13/2017 1119   MCH 25.9 (L) 03/13/2017 1119   MCH 25.8 (L) 09/29/2015 0514   MCHC 32.3 03/13/2017 1119   MCHC 32.7 09/29/2015 0514   RDW 15.2 03/13/2017 1119   LYMPHSABS 2.7 03/13/2017 1119   MONOABS 0.6 09/28/2015 0929   EOSABS 0.5 (H) 03/13/2017 1119   BASOSABS  0.0 03/13/2017 1119   Iron/TIBC/Ferritin/ %Sat No results found for: IRON, TIBC, FERRITIN, IRONPCTSAT Lipid Panel     Component Value Date/Time   CHOL 136 05/30/2017 1058   TRIG 221 (H) 05/30/2017 1058   HDL 42 05/30/2017 1058   LDLCALC 50 05/30/2017 1058   Hepatic Function Panel     Component Value Date/Time   PROT 7.1 05/30/2017 1058   ALBUMIN 4.2 05/30/2017 1058   AST 13 05/30/2017 1058   ALT 13 05/30/2017 1058   ALKPHOS 51 05/30/2017 1058   BILITOT 0.2 05/30/2017 1058      Component Value Date/Time   TSH 1.720 03/13/2017 1119   Results for JOSEPHA, BARBIER (MRN [id number]) as of 09/11/2017 15:51  Ref. Range 05/30/2017 10:58  Vitamin D, 25-Hydroxy Latest Ref Range: 30.0 - 100.0 ng/mL 65.6   ASSESSMENT AND PLAN: Elevated serum free T4 level - Plan: T3, T4, free, TSH  Vitamin D deficiency  Class 1 obesity with serious comorbidity and body mass index (BMI) of 31.0 to 31.9 in adult, unspecified obesity type  PLAN:  Elevated T4 Jahnya was informed of the importance of good thyroid control to help with weight loss efforts. We will repeat thyroid panel today and Lauren Cardenas agrees to follow up as directed.  Vitamin D Deficiency Lauren Cardenas was informed that low vitamin D levels contributes to fatigue and are associated with obesity, breast, and colon cancer. She agrees to continue to take prescription Vit D @50 ,000 IU every week and will follow up for routine testing of vitamin D, at least 2-3 times per year. She was informed of the risk of over-replacement of vitamin D and agrees to not increase her dose unless she discusses this with [place] first.  Obesity Lauren Cardenas is currently in the action stage of change. As such, her goal is to continue with weight loss efforts She has agreed to portion control better and make smarter food choices, such as increase vegetables and decrease simple carbohydrates  Lauren Cardenas has been instructed to work up to a goal of 150 minutes of combined cardio and  strengthening exercise per week for weight loss and overall health benefits. We discussed the following Behavioral Modification Strategies today: increasing lean protein intake  and work on meal planning and easy cooking plans  Lauren Cardenas has agreed to follow up with our clinic in 2 weeks. She was informed of the importance of frequent follow up visits to maximize her success with intensive lifestyle modifications for her multiple health conditions.   OBESITY BEHAVIORAL INTERVENTION VISIT  Today's visit was # 12 out of 22.  Starting weight: 181 lbs Starting date: 03/13/17 Today's weight : 170 lbs Today's date: 09/11/2017 Total lbs lost to date: 11 (Patients must lose 7 lbs in the first 6 months to continue with counseling)   ASK: We discussed the diagnosis of obesity with Lauren Cardenas today and Lauren Cardenas agreed to give [place] permission to discuss obesity behavioral modification therapy today.  ASSESS: Lauren Cardenas has the diagnosis of obesity and her BMI today is 31.09 Lauren Cardenas is in the action stage of change   ADVISE: Lauren Cardenas was educated on the multiple health risks of obesity as well as the benefit of weight loss to improve her health. She was advised of the need for long term treatment and the importance of lifestyle modifications.  AGREE: Multiple dietary modification options and treatment options were discussed and  Lauren Cardenas agreed to the above obesity treatment plan.   Cristi Loron, am acting as transcriptionist for [employer], PA-C I, [facility], have reviewed this note and agree with its content

## 2017-09-12 LAB — T3: T3, Total: 138 ng/dL (ref 71–180)

## 2017-09-12 LAB — T4, FREE: FREE T4: 1.44 ng/dL (ref 0.82–1.77)

## 2017-09-12 LAB — TSH: TSH: 2.67 u[IU]/mL (ref 0.450–4.500)

## 2017-09-16 ENCOUNTER — Other Ambulatory Visit: Payer: Self-pay | Admitting: Family Medicine

## 2017-09-25 ENCOUNTER — Ambulatory Visit (INDEPENDENT_AMBULATORY_CARE_PROVIDER_SITE_OTHER): Payer: Medicaid Other | Admitting: Physician Assistant

## 2017-09-25 VITALS — BP 128/85 | HR 72 | Temp 98.4°F | Ht 62.0 in | Wt 173.0 lb

## 2017-09-25 DIAGNOSIS — E781 Pure hyperglyceridemia: Secondary | ICD-10-CM | POA: Diagnosis not present

## 2017-09-25 DIAGNOSIS — Z9189 Other specified personal risk factors, not elsewhere classified: Secondary | ICD-10-CM | POA: Diagnosis not present

## 2017-09-25 DIAGNOSIS — D72828 Other elevated white blood cell count: Secondary | ICD-10-CM | POA: Diagnosis not present

## 2017-09-25 DIAGNOSIS — E669 Obesity, unspecified: Secondary | ICD-10-CM | POA: Diagnosis not present

## 2017-09-25 DIAGNOSIS — E559 Vitamin D deficiency, unspecified: Secondary | ICD-10-CM

## 2017-09-25 DIAGNOSIS — R7303 Prediabetes: Secondary | ICD-10-CM

## 2017-09-25 DIAGNOSIS — Z6831 Body mass index (BMI) 31.0-31.9, adult: Secondary | ICD-10-CM

## 2017-09-25 NOTE — Progress Notes (Signed)
Office: [phone]  /  Fax: [phone]   HPI:   Chief Complaint: OBESITY Lauren Cardenas is here to discuss her progress with her obesity treatment plan. She has been controlling her portions and making smarter food choices.. She states she is exercising 0 minutes 0 times per week. Lauren Cardenas has not been following the meal plan, as she requested to be off the plan. She states she has not felt good with the unhealthier food options. She is motivated to get back on track and continue with weight loss. Her weight is 173 lb (78.5 kg) today and has had a weight gain of 3 pounds over a period of 2 weeks since her last visit. She has lost 8 lbs since starting treatment with [place].  Hypertriglyceridemia Lauren Cardenas has hypertriglyceridemia and is not on cholesterol medications. She declines medications today. She states, she likes the omega 3 OTC. Lauren Cardenas has been trying to improve her cholesterol levels with intensive lifestyle modification including a low saturated fat diet, exercise and weight loss. She denies any chest pain or claudication.  Pre-Diabetes Lauren Cardenas has a diagnosis of pre-diabetes based on her elevated Hgb A1c and was informed this puts her at greater risk of developing diabetes. She is taking metformin currently and continues to work on diet and exercise to decrease risk of diabetes. She denies nausea, polyphagia or hypoglycemia.  At risk for cardiovascular disease Lauren Cardenas is at a higher than average risk for cardiovascular disease due to obesity, hypertriglyceridemia and pre-diabetes. She currently denies any chest pain.  Vitamin D deficiency Lauren Cardenas has a diagnosis of vitamin D deficiency. She is currently taking vit D and denies nausea, vomiting or muscle weakness.  Leukocytosis Lauren Cardenas's  WBC is elevated on multiple visits and there has been no recent infection or inflammation. She is non febrile and Hgb is normal at 14.2.   ALLERGIES: Allergies  Allergen Reactions  . Sudafed  [Pseudoephedrine Hcl] Shortness Of Breath  . Amoxicillin Hives    MEDICATIONS: Current Outpatient Medications on File Prior to Visit  Medication Sig Dispense Refill  . acetaminophen (TYLENOL) 500 MG tablet Take 1,000 mg by mouth every 6 (six) hours as needed.    Marland Kitchen buPROPion (WELLBUTRIN SR) 150 MG 12 hr tablet Take 1 tablet (150 mg total) by mouth daily. 30 tablet 0  . CRANBERRY PO Take 500 mg by mouth daily.    . cyclobenzaprine (FLEXERIL) 5 MG tablet Take 1 tablet (5 mg total) by mouth 3 (three) times daily as needed for muscle spasms. 15 tablet 0  . cycloSPORINE (RESTASIS) 0.05 % ophthalmic emulsion Place 1 drop into both eyes 2 (two) times daily.    Burman Blacksmith ALLERGY RELIEF, CETIRIZINE, 10 MG tablet TAKE 1 TABLET BY MOUTH ONCE DAILY 90 tablet 0  . fluticasone (FLONASE) 50 MCG/ACT nasal spray Place 2 sprays as needed into both nostrils for allergies or rhinitis. 16 g 0  . HYDROcodone-acetaminophen (NORCO) 7.5-325 MG tablet Take 1 tablet by mouth every 6 (six) hours as needed for moderate pain.    Marland Kitchen ibuprofen (ADVIL,MOTRIN) 200 MG tablet Take 400-800 mg by mouth 3 (three) times daily.     Marland Kitchen ketorolac (TORADOL) 10 MG tablet Take 10 mg by mouth every 6 (six) hours as needed.    Marland Kitchen levonorgestrel-ethinyl estradiol (AVIANE,ALESSE,LESSINA) 0.1-20 MG-MCG tablet Take 1 tablet by mouth daily.    . metFORMIN (GLUCOPHAGE) 500 MG tablet Take 1 tablet (500 mg total) by mouth daily with breakfast. 30 tablet 0  . Multiple Vitamins-Minerals (CENTRUM MULTIGUMMIES) CHEW  Chew 1 tablet by mouth daily.    . Olopatadine HCl (PAZEO) 0.7 % SOLN Apply 1 drop to eye daily.    . promethazine (PHENERGAN) 25 MG tablet Take 1 tablet (25 mg total) as needed by mouth for nausea or vomiting. 30 tablet 11  . propranolol ER (INDERAL LA) 120 MG 24 hr capsule Take 1 capsule (120 mg total) at bedtime by mouth. 90 capsule 4  . rizatriptan (MAXALT-MLT) 10 MG disintegrating tablet Take 1 tablet (10 mg total) 3 (three) times daily as  needed by mouth for migraine. 9 tablet 11  . tamsulosin (FLOMAX) 0.4 MG CAPS capsule Take 0.4 mg by mouth as needed.    . Vitamin D, Ergocalciferol, (DRISDOL) 50000 units CAPS capsule Take 1 capsule (50,000 Units total) by mouth every 7 (seven) days. 4 capsule 0   No current facility-administered medications on file prior to visit.     PAST MEDICAL HISTORY: Past Medical History:  Diagnosis Date  . Back pain   . Chronic migraine   . Constipation   . Dysuria   . GERD (gastroesophageal reflux disease)   . History of acute pyelonephritis    09-27-2015  . History of kidney stones   . HTN (hypertension)   . Interstitial cystitis   . Neck pain   . Nephrolithiasis    bilateral  non-obstructive per ct 09-27-2015  . OAB (overactive bladder)   . Obesity   . Right ureteral stone   . Shoulder pain   . Urgency of urination   . Vesicoureteral reflux     PAST SURGICAL HISTORY: Past Surgical History:  Procedure Laterality Date  . CYSTOSCOPY W/ URETERAL STENT PLACEMENT Right 09/27/2015   Procedure: CYSTOSCOPY WITH RETROGRADE PYELOGRAM/URETERAL STENT PLACEMENT;  Surgeon: Hildred Laser, MD;  Location: [facility];  Service: Urology;  Laterality: Right;  . CYSTOSCOPY WITH RETROGRADE PYELOGRAM, URETEROSCOPY AND STENT PLACEMENT Left 09/09/2014   Procedure: CYSTOSCOPY WITH LEFT RETROGRADE PYELOGRAM, URETEROSCOPY AND STONE EXTRACTION  DOUBLE J STENT PLACEMENT;  Surgeon: Jerilee Field, MD;  Location: [facility];  Service: Urology;  Laterality: Left;  . CYSTOSCOPY/URETEROSCOPY/HOLMIUM LASER/STENT PLACEMENT Right 10/16/2015   Procedure: RIGHT URETEROSCOPY/HOLMIUM LASER/STENT PLACEMENT;  Surgeon: Jerilee Field, MD;  Location: [facility];  Service: Urology;  Laterality: Right;  . HOLMIUM LASER APPLICATION Left 09/09/2014   Procedure: HOLMIUM LASER APPLICATION;  Surgeon: Jerilee Field, MD;  Location: [facility];  Service: Urology;  Laterality: Left;  . HOLMIUM LASER APPLICATION Right 10/16/2015    Procedure: HOLMIUM LASER APPLICATION;  Surgeon: Jerilee Field, MD;  Location[facility];  Service: Urology;  Laterality: Right;  . STONE EXTRACTION WITH BASKET Right 10/16/2015   Procedure: STONE EXTRACTION WITH BASKET;  Surgeon: Jerilee Field, MD;  Location: [facility];  Service: Urology;  Laterality: Right;  . WISDOM TOOTH EXTRACTION  03/2014    SOCIAL HISTORY: Social History   Tobacco Use  . Smoking status: Never Smoker  . Smokeless tobacco: Never Used  Substance Use Topics  . Alcohol use: No  . Drug use: No    FAMILY HISTORY: Family History  Problem Relation Age of Onset  . Hypertension Mother   . Obesity Mother   . Diabetes Father   . Kidney disease Father   . Sudden death Father     ROS: Review of Systems  Constitutional: Negative for fever and weight loss.  Cardiovascular: Negative for chest pain and claudication.  Gastrointestinal: Negative for nausea and vomiting.  Musculoskeletal:  Negative for muscle weakness  Endo/Heme/Allergies:       Negative for polyphagia Negative for hypoglycemia    PHYSICAL EXAM: Blood pressure 128/85, pulse 72, temperature 98.4 F (36.9 C), temperature source Oral, height 5\' 2"  (1.575 m), weight 173 lb (78.5 kg), SpO2 97 %. Body mass index is 31.64 kg/m. Physical Exam  Constitutional: She is oriented to person, place, and time. She appears well-developed and well-nourished.  Cardiovascular: Normal rate.  Pulmonary/Chest: Effort normal.  Musculoskeletal: Normal range of motion.  Neurological: She is oriented to person, place, and time.  Skin: Skin is warm and dry.  Psychiatric: She has a normal mood and affect. Her behavior is normal.  Vitals reviewed.   RECENT LABS AND TESTS: BMET    Component Value Date/Time   NA 140 05/30/2017 1058   K 4.8 05/30/2017 1058   CL 103 05/30/2017 1058   CO2 22 05/30/2017 1058   GLUCOSE 88 05/30/2017 1058   GLUCOSE 119 (H) 09/29/2015 0514    BUN 13 05/30/2017 1058   CREATININE 0.74 05/30/2017 1058   CALCIUM 9.3 05/30/2017 1058   GFRNONAA 107 05/30/2017 1058   GFRAA 123 05/30/2017 1058   Lab Results  Component Value Date   HGBA1C 5.7 (H) 05/30/2017   HGBA1C 6.1 (H) 03/13/2017   Lab Results  Component Value Date   INSULIN 28.3 (H) 05/30/2017   INSULIN 44.5 (H) 03/13/2017   CBC    Component Value Date/Time   WBC 12.9 (H) 03/13/2017 1119   WBC 14.9 (H) 09/29/2015 0514   RBC 5.48 (H) 03/13/2017 1119   RBC 4.34 09/29/2015 0514   HGB 14.2 03/13/2017 1119   HCT 43.9 03/13/2017 1119   PLT 414 (H) 10/05/2015 0909   MCV 80 03/13/2017 1119   MCH 25.9 (L) 03/13/2017 1119   MCH 25.8 (L) 09/29/2015 0514   MCHC 32.3 03/13/2017 1119   MCHC 32.7 09/29/2015 0514   RDW 15.2 03/13/2017 1119   LYMPHSABS 2.7 03/13/2017 1119   MONOABS 0.6 09/28/2015 0929   EOSABS 0.5 (H) 03/13/2017 1119   BASOSABS 0.0 03/13/2017 1119   Iron/TIBC/Ferritin/ %Sat No results found for: IRON, TIBC, FERRITIN, IRONPCTSAT Lipid Panel     Component Value Date/Time   CHOL 136 05/30/2017 1058   TRIG 221 (H) 05/30/2017 1058   HDL 42 05/30/2017 1058   LDLCALC 50 05/30/2017 1058   Hepatic Function Panel     Component Value Date/Time   PROT 7.1 05/30/2017 1058   ALBUMIN 4.2 05/30/2017 1058   AST 13 05/30/2017 1058   ALT 13 05/30/2017 1058   ALKPHOS 51 05/30/2017 1058   BILITOT 0.2 05/30/2017 1058      Component Value Date/Time   TSH 2.670 09/11/2017 1232   TSH 1.720 03/13/2017 1119   Results for LAKENDRA, HELLING (MRN [id number]) as of 09/25/2017 10:24  Ref. Range 05/30/2017 10:58  Vitamin D, 25-Hydroxy Latest Ref Range: 30.0 - 100.0 ng/mL 65.6   ASSESSMENT AND PLAN: Hypertriglyceridemia - Plan: Lipid Panel With LDL/HDL Ratio  Vitamin D deficiency - Plan: VITAMIN D 25 Hydroxy (Vit-D Deficiency, Fractures)  Prediabetes - Plan: Comprehensive metabolic panel, Hemoglobin A1c, Insulin, random  Reticulocytosis - Plan: CBC With Differential  At  risk for heart disease  Class 1 obesity with serious comorbidity and body mass index (BMI) of 31.0 to 31.9 in adult, unspecified obesity type  PLAN:  Hypertriglyceridemia Lauren Cardenas was informed of the American Heart Association Guidelines emphasizing intensive lifestyle modifications as the first line treatment for hyperlipidemia. We discussed  many lifestyle modifications today in depth, and Lauren Cardenas will continue to work on decreasing saturated fats such as fatty red meat, butter and many fried foods. She will also increase vegetables and lean protein in her diet and continue to work on exercise and weight loss efforts. We will check labs and Lauren Cardenas will follow up as directed.  Pre-Diabetes Lauren Cardenas will continue to work on weight loss, exercise, and decreasing simple carbohydrates in her diet to help decrease the risk of diabetes. We dicussed metformin including benefits and risks. She was informed that eating too many simple carbohydrates or too many calories at one sitting increases the likelihood of GI side effects. Lauren Cardenas agreed to continue metformin 500 mg daily  We will check labs and. Lauren Cardenas agreed to follow up with us as directed to monitor her progress.  Cardiovascular risk counseling Lauren Cardenas was given extended (15 minutes) coronary artery disease prevention counseling today. She is 32 y.o. female and has risk factors for heart disease including obesity, hypertriglyceridemia and pre-diabetes. We discussed intensive lifestyle modifications today with an emphasis on specific weight loss instructions and strategies. Pt was also informed of the importance of increasing exercise and decreasing saturated fats to help prevent heart disease.  Vitamin D Deficiency Lauren Cardenas was informed that low vitamin D levels contributes to fatigue and are associated with obesity, breast, and colon cancer. She agrees to continue to take prescription Vit D @50 ,000 IU every week and we will check labs. She will  follow up for routine testing of vitamin D, at least 2-3 times per year. She was informed of the risk of over-replacement of vitamin D and agrees to not increase her dose unless she discusses this with us first.  Leukocyosis  We will check CBC and Lauren Cardenas agrees to follow up with our clinic in 2 weeks.  Obesity Lauren Cardenas is currently in the action stage of change. As such, her goal is to continue with weight loss efforts She has agreed to follow the Category 3 plan Lauren Cardenas has been instructed to work up to a goal of 150 minutes of combined cardio and strengthening exercise per week for weight loss and overall health benefits. We discussed the following Behavioral Modification Strategies today: increasing lean protein intake and work on meal planning and easy cooking plans  Lauren Cardenas has agreed to follow up with our clinic in 2 weeks. She was informed of the importance of frequent follow up visits to maximize her success with intensive lifestyle modifications for her multiple health conditions.   OBESITY BEHAVIORAL INTERVENTION VISIT  Today's visit was # 13 out of 22.  Starting weight: 181 lbs Starting date: 03/13/17 Today's weight : 173 lbs Today's date: 09/25/2017 Total lbs lost to date: 8 (Patients must lose 7 lbs in the first 6 months to continue with counseling)   ASK: We discussed the diagnosis of obesity with Mindi Curlingachael D Bhalla today and Osmara agreed to give us permission to discuss obesity behavioral modification therapy today.  ASSESS: Lawrence has the diagnosis of obesity and her BMI today is 31.63 Ayrabella is in the action stage of change   ADVISE: Devon was educated on the multiple health risks of obesity as well as the benefit of weight loss to improve her health. She was advised of the need for long term treatment and the importance of lifestyle modifications.  AGREE: Multiple dietary modification options and treatment options were discussed and  Amila agreed to the  above obesity treatment plan.   Cristi LoronI, Joanne Murray, am acting as  transcriptionist for Illa Level, PA-C I, Illa Level [facility], have reviewed this note and agree with its content

## 2017-09-26 LAB — COMPREHENSIVE METABOLIC PANEL WITH GFR
ALT: 17 IU/L (ref 0–32)
AST: 15 IU/L (ref 0–40)
Albumin/Globulin Ratio: 1.5 (ref 1.2–2.2)
Albumin: 4.3 g/dL (ref 3.5–5.5)
Alkaline Phosphatase: 45 IU/L (ref 39–117)
BUN/Creatinine Ratio: 19 (ref 9–23)
BUN: 14 mg/dL (ref 6–20)
Bilirubin Total: 0.3 mg/dL (ref 0.0–1.2)
CO2: 22 mmol/L (ref 20–29)
Calcium: 9.5 mg/dL (ref 8.7–10.2)
Chloride: 100 mmol/L (ref 96–106)
Creatinine, Ser: 0.73 mg/dL (ref 0.57–1.00)
GFR calc Af Amer: 125 mL/min/1.73
GFR calc non Af Amer: 109 mL/min/1.73
Globulin, Total: 2.9 g/dL (ref 1.5–4.5)
Glucose: 78 mg/dL (ref 65–99)
Potassium: 4.7 mmol/L (ref 3.5–5.2)
Sodium: 139 mmol/L (ref 134–144)
Total Protein: 7.2 g/dL (ref 6.0–8.5)

## 2017-09-26 LAB — LIPID PANEL WITH LDL/HDL RATIO
Cholesterol, Total: 178 mg/dL (ref 100–199)
HDL: 57 mg/dL
LDL Calculated: 62 mg/dL (ref 0–99)
LDl/HDL Ratio: 1.1 ratio (ref 0.0–3.2)
Triglycerides: 294 mg/dL — ABNORMAL HIGH (ref 0–149)
VLDL Cholesterol Cal: 59 mg/dL — ABNORMAL HIGH (ref 5–40)

## 2017-09-26 LAB — HEMOGLOBIN A1C
ESTIMATED AVERAGE GLUCOSE: 120 mg/dL
HEMOGLOBIN A1C: 5.8 % — AB (ref 4.8–5.6)

## 2017-09-26 LAB — CBC WITH DIFFERENTIAL
Basophils Absolute: 0.1 10*3/uL (ref 0.0–0.2)
Basos: 0 %
EOS (ABSOLUTE): 0.5 10*3/uL — AB (ref 0.0–0.4)
Eos: 4 %
Hematocrit: 39.6 % (ref 34.0–46.6)
Hemoglobin: 13.4 g/dL (ref 11.1–15.9)
IMMATURE GRANULOCYTES: 0 %
Immature Grans (Abs): 0 10*3/uL (ref 0.0–0.1)
Lymphocytes Absolute: 2.3 10*3/uL (ref 0.7–3.1)
Lymphs: 17 %
MCH: 26.5 pg — ABNORMAL LOW (ref 26.6–33.0)
MCHC: 33.8 g/dL (ref 31.5–35.7)
MCV: 78 fL — AB (ref 79–97)
MONOS ABS: 0.7 10*3/uL (ref 0.1–0.9)
Monocytes: 6 %
NEUTROS PCT: 73 %
Neutrophils Absolute: 9.9 10*3/uL — ABNORMAL HIGH (ref 1.4–7.0)
RBC: 5.06 x10E6/uL (ref 3.77–5.28)
RDW: 14.9 % (ref 12.3–15.4)
WBC: 13.5 10*3/uL — AB (ref 3.4–10.8)

## 2017-09-26 LAB — VITAMIN D 25 HYDROXY (VIT D DEFICIENCY, FRACTURES): Vit D, 25-Hydroxy: 65.5 ng/mL (ref 30.0–100.0)

## 2017-09-26 LAB — INSULIN, RANDOM: INSULIN: 41.1 u[IU]/mL — AB (ref 2.6–24.9)

## 2017-10-05 ENCOUNTER — Encounter: Payer: Self-pay | Admitting: Family Medicine

## 2017-10-06 ENCOUNTER — Ambulatory Visit (INDEPENDENT_AMBULATORY_CARE_PROVIDER_SITE_OTHER): Payer: Medicaid Other

## 2017-10-06 ENCOUNTER — Encounter: Payer: Self-pay | Admitting: Family Medicine

## 2017-10-06 ENCOUNTER — Other Ambulatory Visit: Payer: Self-pay | Admitting: Family Medicine

## 2017-10-06 ENCOUNTER — Ambulatory Visit: Payer: Medicaid Other | Admitting: Family Medicine

## 2017-10-06 VITALS — BP 115/79 | HR 78 | Temp 98.7°F | Ht 62.0 in | Wt 176.0 lb

## 2017-10-06 DIAGNOSIS — N3000 Acute cystitis without hematuria: Secondary | ICD-10-CM

## 2017-10-06 DIAGNOSIS — R822 Biliuria: Secondary | ICD-10-CM | POA: Diagnosis not present

## 2017-10-06 DIAGNOSIS — R0781 Pleurodynia: Secondary | ICD-10-CM

## 2017-10-06 DIAGNOSIS — D72829 Elevated white blood cell count, unspecified: Secondary | ICD-10-CM | POA: Diagnosis not present

## 2017-10-06 DIAGNOSIS — R3989 Other symptoms and signs involving the genitourinary system: Secondary | ICD-10-CM

## 2017-10-06 LAB — URINALYSIS, COMPLETE
Bilirubin, UA: POSITIVE — AB
Glucose, UA: NEGATIVE
Nitrite, UA: NEGATIVE
PH UA: 6 (ref 5.0–7.5)
Specific Gravity, UA: 1.025 (ref 1.005–1.030)
Urobilinogen, Ur: 1 mg/dL (ref 0.2–1.0)

## 2017-10-06 LAB — MICROSCOPIC EXAMINATION
Epithelial Cells (non renal): >10 /hpf — AB (ref 0–10)
RENAL EPITHEL UA: NONE SEEN /HPF

## 2017-10-06 MED ORDER — CIPROFLOXACIN HCL 250 MG PO TABS: 250.0000 mg | ORAL_TABLET | Freq: Two times a day (BID) | ORAL | 0 refills | Status: DC

## 2017-10-06 NOTE — Progress Notes (Signed)
   HPI  Patient presents today here with rib pain and urine discoloration.  Patient states that she has had off-and-on rib pain for years.  She states that yesterday it started at 5:30 AM described as bilateral rib pain that is sore and worse with movement or deep breathing. She also had some nausea and some sweats, however these have improved over the last 24 hours. Her appetite is down. She does not necessarily feel ill and denies any cough.  She is tolerating food and fluids okay. She also has some orange discolored urine. Denies any fever  PMH: Smoking status noted ROS: Per HPI  Objective: BP 115/79   Pulse 78   Temp 98.7 F (37.1 C) (Oral)   Ht 5\' 2"  (1.575 m)   Wt 176 lb (79.8 kg)   BMI 32.19 kg/m  Gen: NAD, alert, cooperative with exam HEENT: NCAT, EOMI, PERRL CV: RRR, good S1/S2, no murmur Resp: CTABL, no wheezes, non-labored Abd: Mild tenderness to palpation over the bilateral lower ribs Ext: No edema, warm Neuro: Alert and oriented, No gross deficits  Assessment and plan:  #Rib pain Unusual symptoms, patient does not appear ill, does not appear to have any flulike symptoms. Plain film to rule out pleurisy or causes of pleurisy Discussed NSAIDs  #urine  discoloration Urinalysis, pending Culture if any concerns for UTI    Orders Placed This Encounter  Procedures  . DG Chest 2 View    Standing Status:   Future    Number of Occurrences:   1    Standing Expiration Date:   12/07/2018    Order Specific Question:   Reason for Exam (SYMPTOM  OR DIAGNOSIS REQUIRED)    Answer:   rib pain    Order Specific Question:   Is patient pregnant?    Answer:   No    Order Specific Question:   Preferred imaging location?    Answer:   Internal    Order Specific Question:   Radiology Contrast Protocol - do NOT remove file path    Answer:   [email or web address]  . Urinalysis, Complete    Murtis SinkSam Chrisha Vogel, MD [facility] 10/06/2017, 8:43 AM

## 2017-10-06 NOTE — Addendum Note (Signed)
Addended by: Orma RenderHODGES, Stephaine Breshears F on: 10/06/2017 05:20 PM   Modules accepted: Orders

## 2017-10-06 NOTE — Patient Instructions (Signed)
Great to see you!   

## 2017-10-07 ENCOUNTER — Other Ambulatory Visit: Payer: Self-pay

## 2017-10-07 ENCOUNTER — Emergency Department (HOSPITAL_COMMUNITY)
Admission: EM | Admit: 2017-10-07 | Discharge: 2017-10-07 | Disposition: A | Discharge status: Home / Self Care | Payer: Medicaid Other | Attending: Emergency Medicine | Admitting: Emergency Medicine

## 2017-10-07 ENCOUNTER — Encounter (HOSPITAL_COMMUNITY): Payer: Self-pay | Admitting: Emergency Medicine

## 2017-10-07 ENCOUNTER — Emergency Department (HOSPITAL_COMMUNITY): Payer: Medicaid Other

## 2017-10-07 DIAGNOSIS — Z7984 Long term (current) use of oral hypoglycemic drugs: Secondary | ICD-10-CM | POA: Diagnosis not present

## 2017-10-07 DIAGNOSIS — R1011 Right upper quadrant pain: Secondary | ICD-10-CM | POA: Diagnosis not present

## 2017-10-07 DIAGNOSIS — R112 Nausea with vomiting, unspecified: Secondary | ICD-10-CM | POA: Diagnosis not present

## 2017-10-07 DIAGNOSIS — R799 Abnormal finding of blood chemistry, unspecified: Secondary | ICD-10-CM | POA: Diagnosis not present

## 2017-10-07 DIAGNOSIS — R1013 Epigastric pain: Secondary | ICD-10-CM | POA: Diagnosis not present

## 2017-10-07 DIAGNOSIS — K8020 Calculus of gallbladder without cholecystitis without obstruction: Secondary | ICD-10-CM | POA: Insufficient documentation

## 2017-10-07 DIAGNOSIS — Z79899 Other long term (current) drug therapy: Secondary | ICD-10-CM | POA: Insufficient documentation

## 2017-10-07 DIAGNOSIS — I10 Essential (primary) hypertension: Secondary | ICD-10-CM | POA: Diagnosis not present

## 2017-10-07 DIAGNOSIS — R110 Nausea: Secondary | ICD-10-CM | POA: Insufficient documentation

## 2017-10-07 LAB — CBC
HEMATOCRIT: 41 % (ref 36.0–46.0)
HEMOGLOBIN: 13.2 g/dL (ref 12.0–15.0)
MCH: 26.6 pg (ref 26.0–34.0)
MCHC: 32.2 g/dL (ref 30.0–36.0)
MCV: 82.5 fL (ref 78.0–100.0)
Platelets: 274 10*3/uL (ref 150–400)
RBC: 4.97 MIL/uL (ref 3.87–5.11)
RDW: 13.9 % (ref 11.5–15.5)
WBC: 10.6 10*3/uL — ABNORMAL HIGH (ref 4.0–10.5)

## 2017-10-07 LAB — COMPREHENSIVE METABOLIC PANEL
ALBUMIN: 3.6 g/dL (ref 3.5–5.0)
ALT: 171 U/L — ABNORMAL HIGH (ref 14–54)
AST: 97 U/L — AB (ref 15–41)
Alkaline Phosphatase: 113 U/L (ref 38–126)
Anion gap: 9 (ref 5–15)
BUN: 14 mg/dL (ref 6–20)
CHLORIDE: 105 mmol/L (ref 101–111)
CO2: 23 mmol/L (ref 22–32)
Calcium: 8.9 mg/dL (ref 8.9–10.3)
Creatinine, Ser: 0.89 mg/dL (ref 0.44–1.00)
GFR calc Af Amer: >60 mL/min (ref >60)
GFR calc non Af Amer: >60 mL/min (ref >60)
Glucose, Bld: 107 mg/dL — ABNORMAL HIGH (ref 65–99)
POTASSIUM: 3.6 mmol/L (ref 3.5–5.1)
SODIUM: 137 mmol/L (ref 135–145)
Total Bilirubin: 1.7 mg/dL — ABNORMAL HIGH (ref 0.3–1.2)
Total Protein: 7.6 g/dL (ref 6.5–8.1)

## 2017-10-07 LAB — URINALYSIS, ROUTINE W REFLEX MICROSCOPIC
BILIRUBIN URINE: NEGATIVE
GLUCOSE, UA: NEGATIVE mg/dL
Hgb urine dipstick: NEGATIVE
KETONES UR: NEGATIVE mg/dL
Leukocytes, UA: NEGATIVE
Nitrite: NEGATIVE
PH: 8 (ref 5.0–8.0)
Protein, ur: NEGATIVE mg/dL
SPECIFIC GRAVITY, URINE: 1.016 (ref 1.005–1.030)

## 2017-10-07 LAB — I-STAT BETA HCG BLOOD, ED (MC, WL, AP ONLY): I-stat hCG, quantitative: <5 m[IU]/mL (ref <5)

## 2017-10-07 LAB — LIPASE, BLOOD: Lipase: 34 U/L (ref 11–51)

## 2017-10-07 MED ORDER — IOPAMIDOL (ISOVUE-300) INJECTION 61%
100.0000 mL | Freq: Once | INTRAVENOUS | Status: AC | PRN
  Administered 2017-10-07: 100 mL via INTRAVENOUS

## 2017-10-07 MED ORDER — IOPAMIDOL (ISOVUE-300) INJECTION 61%
INTRAVENOUS | Status: AC
  Filled 2017-10-07: qty 100

## 2017-10-07 NOTE — ED Triage Notes (Signed)
Pt reports pain in her sides since Thursday morning was seen by her doctor who ordered lab work. Lab results show elevated Liver enzymes.

## 2017-10-07 NOTE — ED Notes (Signed)
Patient currently at CT scan .  

## 2017-10-07 NOTE — ED Provider Notes (Signed)
[facility] EMERGENCY DEPARTMENT Provider Note   CSN: [id number] Arrival date & time: 10/07/17  1612     History   Chief Complaint Chief Complaint  Patient presents with  . Abdominal Pain  . Abnormal Labs    HPI Lauren Cardenas is a 32 y.o. female.  Patient sent over for abnormal labs. Patient's had intermittent pain worse the past few days right upper quadrant. No known history of gallbladder pathology. No fevers or chills. Mild nausea currently. Pain is resolved.nonsmoker.No alcohol use.not specifically worse with fatty foods.     Past Medical History:  Diagnosis Date  . Back pain   . Chronic migraine   . Constipation   . Dysuria   . GERD (gastroesophageal reflux disease)   . History of acute pyelonephritis    09-27-2015  . History of kidney stones   . HTN (hypertension)   . Interstitial cystitis   . Neck pain   . Nephrolithiasis    bilateral  non-obstructive per ct 09-27-2015  . OAB (overactive bladder)   . Obesity   . Right ureteral stone   . Shoulder pain   . Urgency of urination   . Vesicoureteral reflux     Patient Active Problem List   Diagnosis Date Noted  . Prediabetes 05/30/2017  . Vitamin D deficiency 05/30/2017  . Other hyperlipidemia 05/30/2017  . Depression 05/30/2017  . Class 1 obesity with serious comorbidity and body mass index (BMI) of 31.0 to 31.9 in adult 05/30/2017  . Migraine without aura and without status migrainosus, not intractable 05/09/2017  . Other fatigue 03/13/2017  . Hyperglycemia 03/13/2017  . Class 1 obesity with serious comorbidity and body mass index (BMI) of 33.0 to 33.9 in adult 03/13/2017  . Syncope and collapse 09/27/2015  . Nephrolithiasis 09/27/2015  . Syncope 03/20/2012    Past Surgical History:  Procedure Laterality Date  . CYSTOSCOPY W/ URETERAL STENT PLACEMENT Right 09/27/2015   Procedure: CYSTOSCOPY WITH RETROGRADE PYELOGRAM/URETERAL STENT PLACEMENT;  Surgeon: Hildred Laser, MD;   Location: [facility];  Service: Urology;  Laterality: Right;  . CYSTOSCOPY WITH RETROGRADE PYELOGRAM, URETEROSCOPY AND STENT PLACEMENT Left 09/09/2014   Procedure: CYSTOSCOPY WITH LEFT RETROGRADE PYELOGRAM, URETEROSCOPY AND STONE EXTRACTION  DOUBLE J STENT PLACEMENT;  Surgeon: Jerilee Field, MD;  Location: [facility];  Service: Urology;  Laterality: Left;  . CYSTOSCOPY/URETEROSCOPY/HOLMIUM LASER/STENT PLACEMENT Right 10/16/2015   Procedure: RIGHT URETEROSCOPY/HOLMIUM LASER/STENT PLACEMENT;  Surgeon: Jerilee Field, MD;  Location: [facility];  Service: Urology;  Laterality: Right;  . HOLMIUM LASER APPLICATION Left 09/09/2014   Procedure: HOLMIUM LASER APPLICATION;  Surgeon: Jerilee Field, MD;  Location: [facility];  Service: Urology;  Laterality: Left;  . HOLMIUM LASER APPLICATION Right 10/16/2015   Procedure: HOLMIUM LASER APPLICATION;  Surgeon: Jerilee Field, MD;  Location: [facility];  Service: Urology;  Laterality: Right;  . STONE EXTRACTION WITH BASKET Right 10/16/2015   Procedure: STONE EXTRACTION WITH BASKET;  Surgeon: Jerilee Field, MD;  Location: [facility];  Service: Urology;  Laterality: Right;  . WISDOM TOOTH EXTRACTION  03/2014     OB History    Gravida  1   Para  1   Term  1   Preterm      AB      Living  1     SAB      TAB      Ectopic      Multiple      Live Births  1            Home Medications    Prior to Admission medications   Medication Sig Start Date End Date Taking? Authorizing Provider  acetaminophen (TYLENOL) 500 MG tablet Take 1,000 mg by mouth every 6 (six) hours as needed.   Yes [provider]  buPROPion (WELLBUTRIN SR) 150 MG 12 hr tablet Take 1 tablet (150 mg total) by mouth daily. 08/28/17  Yes Camila Li, Sahar M, PA-C  ciprofloxacin (CIPRO) 250 MG tablet Take 1 tablet (250 mg total) by mouth 2 (two) times daily. 10/06/17  Yes Elenora Gamma, MD  CRANBERRY PO Take 500 mg by mouth  daily.   Yes [provider]  EQ ALLERGY RELIEF, CETIRIZINE, 10 MG tablet TAKE 1 TABLET BY MOUTH ONCE DAILY 09/18/17  Yes Elenora Gamma, MD  fluticasone [facility]) 50 MCG/ACT nasal spray Place 2 sprays as needed into both nostrils for allergies or rhinitis. 05/10/17  Yes Gottschalk, Kathie Rhodes M, DO  ibuprofen (ADVIL,MOTRIN) 200 MG tablet Take 400-800 mg by mouth 3 (three) times daily.    Yes [provider]  levonorgestrel-ethinyl estradiol (AVIANE,ALESSE,LESSINA) 0.1-20 MG-MCG tablet Take 1 tablet by mouth daily.   Yes [provider]  metFORMIN (GLUCOPHAGE) 500 MG tablet Take 1 tablet (500 mg total) by mouth daily with breakfast. 08/28/17  Yes Camila Li, Sahar M, PA-C  Multiple Vitamins-Minerals (CENTRUM MULTIGUMMIES) CHEW Chew 1 tablet by mouth daily.   Yes [provider]  Olopatadine HCl (PAZEO) 0.7 % SOLN Apply 1 drop to eye daily.   Yes [provider]  promethazine (PHENERGAN) 25 MG tablet Take 1 tablet (25 mg total) as needed by mouth for nausea or vomiting. 05/08/17  Yes Anson Fret, MD  propranolol ER (INDERAL LA) 120 MG 24 hr capsule Take 1 capsule (120 mg total) at bedtime by mouth. 05/09/17  Yes Anson Fret, MD  rizatriptan (MAXALT-MLT) 10 MG disintegrating tablet Take 1 tablet (10 mg total) 3 (three) times daily as needed by mouth for migraine. 05/09/17  Yes Anson Fret, MD  tamsulosin (FLOMAX) 0.4 MG CAPS capsule Take 0.4 mg by mouth as needed.   Yes [provider]  Vitamin D, Ergocalciferol, (DRISDOL) 50000 units CAPS capsule Take 1 capsule (50,000 Units total) by mouth every 7 (seven) days. 08/28/17  Yes Camila Li, Sahar M, PA-C  cyclobenzaprine (FLEXERIL) 5 MG tablet Take 1 tablet (5 mg total) by mouth 3 (three) times daily as needed for muscle spasms. Patient not taking: Reported on 10/07/2017 08/04/17   Johna Sheriff, MD  ketorolac (TORADOL) 10 MG tablet Take 10 mg by mouth every 6 (six) hours as needed.    [provider]    Family History Family History  Problem Relation Age of Onset  . Hypertension Mother   . Obesity Mother   . Diabetes Father   . Kidney disease Father   . Sudden death Father     Social History Social History   Tobacco Use  . Smoking status: Never Smoker  . Smokeless tobacco: Never Used  Substance Use Topics  . Alcohol use: No  . Drug use: No     Allergies   Sudafed [pseudoephedrine hcl] and Amoxicillin   Review of Systems Review of Systems  Constitutional: Negative for chills and fever.  HENT: Negative for congestion.   Eyes: Negative for visual disturbance.  Respiratory: Negative for shortness of breath.   Cardiovascular: Negative for chest pain.  Gastrointestinal: Positive for abdominal pain, nausea  and vomiting.  Genitourinary: Negative for dysuria and flank pain.  Musculoskeletal: Negative for back pain, neck pain and neck stiffness.  Skin: Negative for rash.  Neurological: Negative for light-headedness and headaches.     Physical Exam Updated Vital Signs BP 124/87 (BP Location: Right Arm)   Pulse 80   Temp 99.8 F (37.7 C) (Oral)   Resp 16   Ht 5\' 2"  (1.575 m)   Wt 79.8 kg (176 lb)   SpO2 97%   BMI 32.19 kg/m   Physical Exam  Constitutional: She is oriented to person, place, and time. She appears well-developed and well-nourished.  HENT:  Head: Normocephalic and atraumatic.  Eyes: Conjunctivae are normal. Right eye exhibits no discharge. Left eye exhibits no discharge. No scleral icterus.  Neck: Normal range of motion. Neck supple. No tracheal deviation present.  Cardiovascular: Normal rate and regular rhythm.  Pulmonary/Chest: Effort normal and breath sounds normal.  Abdominal: Soft. She exhibits no distension. There is no tenderness. There is no guarding.  Musculoskeletal: She exhibits no edema.  Neurological: She is alert and oriented to person, place, and time.  Skin: Skin is warm. No rash noted.  Psychiatric: She has a  normal mood and affect.  Nursing note and vitals reviewed.    ED Treatments / Results  Labs (all labs ordered are listed, but only abnormal results are displayed) Labs Reviewed  COMPREHENSIVE METABOLIC PANEL - Abnormal; Notable for the following components:      Result Value   Glucose, Bld 107 (*)    AST 97 (*)    ALT 171 (*)    Total Bilirubin 1.7 (*)    All other components within normal limits  CBC - Abnormal; Notable for the following components:   WBC 10.6 (*)    All other components within normal limits  URINALYSIS, ROUTINE W REFLEX MICROSCOPIC - Abnormal; Notable for the following components:   Color, Urine AMBER (*)    APPearance HAZY (*)    All other components within normal limits  LIPASE, BLOOD  I-STAT BETA HCG BLOOD, ED ([facility], [facility], [facility] ONLY)    EKG EKG Interpretation  Date/Time:  Saturday October 07 2017 16:25:45 EDT Ventricular Rate:  81 PR Interval:  144 QRS Duration: 86 QT Interval:  394 QTC Calculation: 457 R Axis:   -21 Text Interpretation:  Normal sinus rhythm Anterolateral infarct , age undetermined Abnormal ECG Confirmed by Blane Ohara [phone]) on 10/07/2017 6:39:55 PM   Radiology Dg Chest 2 View  Result Date: 10/06/2017 CLINICAL DATA:  Bilateral rib pain for 2 days, no known injury, initial encounter EXAM: CHEST - 2 VIEW COMPARISON:  None. FINDINGS: The heart size and mediastinal contours are within normal limits. Both lungs are clear. The visualized skeletal structures are unremarkable. IMPRESSION: No active cardiopulmonary disease. Electronically Signed   By: Alcide Clever M.D.   On: 10/06/2017 10:03   US Abdomen Complete  Result Date: 10/07/2017 CLINICAL DATA:  Abnormal liver function, RIGHT upper quadrant epigastric pain since Thursday EXAM: ABDOMEN ULTRASOUND COMPLETE COMPARISON:  CT abdomen and pelvis 09/27/2015 FINDINGS: Gallbladder: Multiple shadowing calculi in gallbladder up to 6 mm diameter. No gallbladder wall thickening, pericholecystic fluid  or sonographic Murphy sign. Common bile duct: Diameter: CBD 5 mm diameter, normal Liver: Normal appearance. No definite hepatic mass or nodularity. Portal vein is patent on color Doppler imaging with normal direction of blood flow towards the liver. IVC: Normal appearance Pancreas: Obscured by bowel gas Spleen: Normal appearance, 6.9 cm length Right Kidney: Length:  12.1 cm. Minimal cortical thinning without mass or hydronephrosis. Left Kidney: Length: 10.6 cm. Minimal cortical thinning without mass or hydronephrosis. Nonshadowing echogenic focus at inferior pole, cannot exclude an 8 mm nonobstructing calculus. Abdominal aorta: Visualized portion normal appearance with remainder obscured by bowel gas Other findings: No free fluid IMPRESSION: Cholelithiasis without evidence of acute cholecystitis or biliary dilatation. Questionable nonobstructing calculus 8 mm diameter at inferior pole LEFT kidney. Incomplete pancreatic and aortic visualization. Electronically Signed   By: Ulyses SouthwardMark  Boles M.D.   On: 10/07/2017 19:39   Ct Abdomen Pelvis W Contrast  Result Date: 10/07/2017 CLINICAL DATA:  Abdominal pain, elevated LFTs EXAM: CT ABDOMEN AND PELVIS WITH CONTRAST TECHNIQUE: Multidetector CT imaging of the abdomen and pelvis was performed using the standard protocol following bolus administration of intravenous contrast. CONTRAST:  100 mL Isovue 300 IV COMPARISON:  Abdominal ultrasound dated 10/07/2017 FINDINGS: Lower chest: Lung bases are clear. Hepatobiliary: Liver is within normal limits. Layering small gallstone (series 3/image 28), without associated inflammatory changes. No intrahepatic ductal dilatation. Common duct measures 7 mm, at the upper limits of normal, increased from the prior. No choledocholithiasis is seen. Pancreas: Within normal limits. Spleen: Within normal limits. Adrenals/Urinary Tract: Adrenal glands are within normal limits. Bilateral renal cortical scarring 6 mm nonobstructing left lower pole renal  calculus (series 3/image 40). No hydronephrosis. Bladder is within normal limits. Stomach/Bowel: Stomach is within normal limits. No evidence of bowel obstruction. Normal appendix (series 3/image 53). Vascular/Lymphatic: No evidence of abdominal aortic aneurysm. No suspicious abdominopelvic lymphadenopathy. Reproductive: Uterus is within normal limits. Bilateral ovaries are within normal limits. Other: No abdominopelvic ascites. Musculoskeletal: Visualized osseous structures are within normal limits. IMPRESSION: Cholelithiasis, without associated inflammatory changes. Common duct is mildly prominent, measuring 7 mm, increased from the prior. No choledocholithiasis is seen. In the setting of elevated LFTs, consider ERCP or MRCP to evaluate for nonvisualized distal CBD stone, as clinically warranted. Electronically Signed   By: Charline BillsSriyesh  Krishnan M.D.   On: 10/07/2017 21:13    Procedures Procedures (including critical care time)  Medications Ordered in ED Medications  iopamidol (ISOVUE-300) 61 % injection 100 mL (100 mLs Intravenous Contrast Given 10/07/17 2019)     Initial Impression / Assessment and Plan / ED Course  I have reviewed the triage vital signs and the nursing notes.  Pertinent labs & imaging results that were available during my care of the patient were reviewed by me and considered in my medical decision making (see chart for details).    Patient presents with concern for biliary colic. Patient did have mild elevation in liver function testing. Repeat blood work showed LFTs are decreasing and bilirubin is down to 1.7. Ultrasound results reviewed showing gallstones without signs of infection. CT scan ordered for further delineation and to look for signs of common bile duct stone which was negative  On reexamination patient having no pain no vomiting. Discussed the case with Dr. Chales AbrahamsGupta for  gastroenterology who felt comfortable patient follow up with general surgeon and primary  doctor.  Results and differential diagnosis were discussed with the patient/parent/guardian. Xrays were independently reviewed by myself.  Close follow up outpatient was discussed, comfortable with the plan.   Medications  iopamidol (ISOVUE-300) 61 % injection 100 mL (100 mLs Intravenous Contrast Given 10/07/17 2019)    Vitals:   10/07/17 1630 10/07/17 2031 10/07/17 2100 10/07/17 2130  BP:  133/80 118/73 124/87  Pulse:  83 85 80  Resp:  16  16  Temp:  99.8 F (  37.7 C)    TempSrc:  Oral    SpO2:  100% 95% 97%  Weight: 79.8 kg (176 lb)     Height: 5\' 2"  (1.575 m)       Final diagnoses:  Cholelithiasis without cholecystitis      Final Clinical Impressions(s) / ED Diagnoses   Final diagnoses:  Cholelithiasis without cholecystitis    ED Discharge Orders    None       Blane Ohara, MD 10/07/17 2219

## 2017-10-07 NOTE — Discharge Instructions (Addendum)
Avoid fatty foods. Follow up with surgery for further evlaution.  Discuss having her gallbladder removed with general surgery and intraoperative testing If needed you can follow-up with gastroenterology however the general surgeon likely will take care of this.

## 2017-10-07 NOTE — ED Notes (Signed)
Patient currently at ultrasound .  

## 2017-10-08 LAB — URINE CULTURE

## 2017-10-09 ENCOUNTER — Ambulatory Visit (INDEPENDENT_AMBULATORY_CARE_PROVIDER_SITE_OTHER): Payer: Medicaid Other | Admitting: Physician Assistant

## 2017-10-09 ENCOUNTER — Telehealth: Payer: Self-pay

## 2017-10-09 VITALS — BP 127/81 | HR 77 | Temp 98.8°F | Ht 62.0 in | Wt 170.0 lb

## 2017-10-09 DIAGNOSIS — E559 Vitamin D deficiency, unspecified: Secondary | ICD-10-CM

## 2017-10-09 DIAGNOSIS — Z6831 Body mass index (BMI) 31.0-31.9, adult: Secondary | ICD-10-CM | POA: Diagnosis not present

## 2017-10-09 DIAGNOSIS — E669 Obesity, unspecified: Secondary | ICD-10-CM | POA: Diagnosis not present

## 2017-10-09 DIAGNOSIS — E781 Pure hyperglyceridemia: Secondary | ICD-10-CM

## 2017-10-09 LAB — LACTATE DEHYDROGENASE: LDH: 254 IU/L — ABNORMAL HIGH (ref 119–226)

## 2017-10-09 LAB — HEPATIC FUNCTION PANEL
ALBUMIN: 4.5 g/dL (ref 3.5–5.5)
ALT: 221 IU/L — ABNORMAL HIGH (ref 0–32)
AST: 152 IU/L — ABNORMAL HIGH (ref 0–40)
Alkaline Phosphatase: 102 IU/L (ref 39–117)
Bilirubin Total: 2 mg/dL — ABNORMAL HIGH (ref 0.0–1.2)
Bilirubin, Direct: 1.52 mg/dL — ABNORMAL HIGH (ref 0.00–0.40)
TOTAL PROTEIN: 7.6 g/dL (ref 6.0–8.5)

## 2017-10-09 LAB — ANA,IFA RA DIAG PNL W/RFLX TIT/PATN
ANA TITER 1: NEGATIVE
CYCLIC CITRULLIN PEPTIDE AB: 9 U (ref 0–19)

## 2017-10-09 MED ORDER — VITAMIN D (ERGOCALCIFEROL) 1.25 MG (50000 UNIT) PO CAPS: 50000.0000 [IU] | ORAL_CAPSULE | ORAL | 0 refills | Status: DC

## 2017-10-09 NOTE — Progress Notes (Signed)
Office: [phone]  /  Fax: [phone]   HPI:   Chief Complaint: OBESITY Lauren Cardenas is here to discuss her progress with her obesity treatment plan. She is on the Category 3 plan and is following her eating plan approximately 15 % of the time. She states she is walking for 12 minutes 2 times per week. [name] was in the emergency department for gallstones and will be consulting with a surgeon for further management. She states she has been nauseated and is unable to follow the meal plan or keep a food journal. Her weight is 170 lb (77.1 kg) today and has had a weight loss of 3 pounds over a period of 2 weeks since her last visit. She has lost 11 lbs since starting treatment with [place].  Vitamin D deficiency Lauren Cardenas has a diagnosis of vitamin D deficiency. She is currently taking vit D and denies nausea, vomiting or muscle weakness.  Hypertriglyceridemia Lauren Cardenas has hypertriglyceridemia with elevated triglycerides  >200 on multiple checks. Her latest visit was in the 290's. She has been trying to improve her cholesterol levels with intensive lifestyle modification including a low saturated fat diet, exercise and weight loss. She denies any chest pain, claudication or myalgias. We will hold of on starting Tricor at this visit.  ALLERGIES: Allergies  Allergen Reactions  . Sudafed [Pseudoephedrine Hcl] Shortness Of Breath  . Amoxicillin Hives    MEDICATIONS: Current Outpatient Medications on File Prior to Visit  Medication Sig Dispense Refill  . acetaminophen (TYLENOL) 500 MG tablet Take 1,000 mg by mouth every 6 (six) hours as needed.    Marland Kitchen buPROPion (WELLBUTRIN SR) 150 MG 12 hr tablet Take 1 tablet (150 mg total) by mouth daily. 30 tablet 0  . ciprofloxacin (CIPRO) 250 MG tablet Take 1 tablet (250 mg total) by mouth 2 (two) times daily. 6 tablet 0  . CRANBERRY PO Take 500 mg by mouth daily.    . cyclobenzaprine (FLEXERIL) 5 MG tablet Take 1 tablet (5 mg total) by mouth 3 (three) times  daily as needed for muscle spasms. (Patient not taking: Reported on 10/07/2017) 15 tablet 0  . EQ ALLERGY RELIEF, CETIRIZINE, 10 MG tablet TAKE 1 TABLET BY MOUTH ONCE DAILY 90 tablet 0  . fluticasone (FLONASE) 50 MCG/ACT nasal spray Place 2 sprays as needed into both nostrils for allergies or rhinitis. 16 g 0  . ibuprofen (ADVIL,MOTRIN) 200 MG tablet Take 400-800 mg by mouth 3 (three) times daily.     Marland Kitchen ketorolac (TORADOL) 10 MG tablet Take 10 mg by mouth every 6 (six) hours as needed.    Marland Kitchen levonorgestrel-ethinyl estradiol (AVIANE,ALESSE,LESSINA) 0.1-20 MG-MCG tablet Take 1 tablet by mouth daily.    . metFORMIN (GLUCOPHAGE) 500 MG tablet Take 1 tablet (500 mg total) by mouth daily with breakfast. 30 tablet 0  . Multiple Vitamins-Minerals (CENTRUM MULTIGUMMIES) CHEW Chew 1 tablet by mouth daily.    . Olopatadine HCl (PAZEO) 0.7 % SOLN Apply 1 drop to eye daily.    . promethazine (PHENERGAN) 25 MG tablet Take 1 tablet (25 mg total) as needed by mouth for nausea or vomiting. 30 tablet 11  . propranolol ER (INDERAL LA) 120 MG 24 hr capsule Take 1 capsule (120 mg total) at bedtime by mouth. 90 capsule 4  . rizatriptan (MAXALT-MLT) 10 MG disintegrating tablet Take 1 tablet (10 mg total) 3 (three) times daily as needed by mouth for migraine. 9 tablet 11  . tamsulosin (FLOMAX) 0.4 MG CAPS capsule Take 0.4 mg by  mouth as needed.     No current facility-administered medications on file prior to visit.     PAST MEDICAL HISTORY: Past Medical History:  Diagnosis Date  . Back pain   . Chronic migraine   . Constipation   . Dysuria   . GERD (gastroesophageal reflux disease)   . History of acute pyelonephritis    09-27-2015  . History of kidney stones   . HTN (hypertension)   . Interstitial cystitis   . Neck pain   . Nephrolithiasis    bilateral  non-obstructive per ct 09-27-2015  . OAB (overactive bladder)   . Obesity   . Right ureteral stone   . Shoulder pain   . Urgency of urination   .  Vesicoureteral reflux     PAST SURGICAL HISTORY: Past Surgical History:  Procedure Laterality Date  . CYSTOSCOPY W/ URETERAL STENT PLACEMENT Right 09/27/2015   Procedure: CYSTOSCOPY WITH RETROGRADE PYELOGRAM/URETERAL STENT PLACEMENT;  Surgeon: Hildred LaserBrian James Budzyn, MD;  Location: [facility];  Service: Urology;  Laterality: Right;  . CYSTOSCOPY WITH RETROGRADE PYELOGRAM, URETEROSCOPY AND STENT PLACEMENT Left 09/09/2014   Procedure: CYSTOSCOPY WITH LEFT RETROGRADE PYELOGRAM, URETEROSCOPY AND STONE EXTRACTION  DOUBLE J STENT PLACEMENT;  Surgeon: Jerilee FieldMatthew Eskridge, MD;  Location: [facility];  Service: Urology;  Laterality: Left;  . CYSTOSCOPY/URETEROSCOPY/HOLMIUM LASER/STENT PLACEMENT Right 10/16/2015   Procedure: RIGHT URETEROSCOPY/HOLMIUM LASER/STENT PLACEMENT;  Surgeon: Jerilee FieldMatthew Eskridge, MD;  [facility] [place];  Service: Urology;  Laterality: Right;  . HOLMIUM LASER APPLICATION Left 09/09/2014   Procedure: HOLMIUM LASER APPLICATION;  Surgeon: Jerilee FieldMatthew Eskridge, MD;  Location: [facility];  Service: Urology;  Laterality: Left;  . HOLMIUM LASER APPLICATION Right 10/16/2015   Procedure: HOLMIUM LASER APPLICATION;  Surgeon: Jerilee FieldMatthew Eskridge, MD;  [facility] [place];  Service: Urology;  Laterality: Right;  . STONE EXTRACTION WITH BASKET Right 10/16/2015   Procedure: STONE EXTRACTION WITH BASKET;  Surgeon: Jerilee FieldMatthew Eskridge, MD;  [facility] [place];  Service: Urology;  Laterality: Right;  . WISDOM TOOTH EXTRACTION  03/2014    SOCIAL HISTORY: Social History   Tobacco Use  . Smoking status: Never Smoker  . Smokeless tobacco: Never Used  Substance Use Topics  . Alcohol use: No  . Drug use: No    FAMILY HISTORY: Family History  Problem Relation Age of Onset  . Hypertension Mother   . Obesity Mother   . Diabetes Father   . Kidney disease Father   . Sudden death Father     ROS: Review of Systems  Constitutional: Positive for weight loss.  Cardiovascular:  Negative for chest pain and claudication.  Gastrointestinal: Negative for nausea and vomiting.  Musculoskeletal: Negative for myalgias.       Negative for muscle weakness    PHYSICAL EXAM: Blood pressure 127/81, pulse 77, temperature 98.8 F (37.1 C), height 5\' 2"  (1.575 m), weight 170 lb (77.1 kg), SpO2 99 %. Body mass index is 31.09 kg/m. Physical Exam  Constitutional: She is oriented to person, place, and time. She appears well-developed and well-nourished.  Cardiovascular: Normal rate.  Pulmonary/Chest: Effort normal.  Musculoskeletal: Normal range of motion.  Neurological: She is oriented to person, place, and time.  Skin: Skin is warm and dry.  Psychiatric: She has a normal mood and affect. Her behavior is normal.  Vitals reviewed.   RECENT LABS AND TESTS: BMET    Component Value Date/Time   NA 137 10/07/2017 1632   NA 139 09/25/2017 1031   K 3.6 10/07/2017 1632  CL 105 10/07/2017 1632   CO2 23 10/07/2017 1632   GLUCOSE 107 (H) 10/07/2017 1632   BUN 14 10/07/2017 1632   BUN 14 09/25/2017 1031   CREATININE 0.89 10/07/2017 1632   CALCIUM 8.9 10/07/2017 1632   GFRNONAA >60 10/07/2017 1632   GFRAA >60 10/07/2017 1632   Lab Results  Component Value Date   HGBA1C 5.8 (H) 09/25/2017   HGBA1C 5.7 (H) 05/30/2017   HGBA1C 6.1 (H) 03/13/2017   Lab Results  Component Value Date   INSULIN 41.1 (H) 09/25/2017   INSULIN 28.3 (H) 05/30/2017   INSULIN 44.5 (H) 03/13/2017   CBC    Component Value Date/Time   WBC 10.6 (H) 10/07/2017 1632   RBC 4.97 10/07/2017 1632   HGB 13.2 10/07/2017 1632   HGB 13.4 09/25/2017 1031   HCT 41.0 10/07/2017 1632   HCT 39.6 09/25/2017 1031   PLT 274 10/07/2017 1632   PLT 414 (H) 10/05/2015 0909   MCV 82.5 10/07/2017 1632   MCV 78 (L) 09/25/2017 1031   MCH 26.6 10/07/2017 1632   MCHC 32.2 10/07/2017 1632   RDW 13.9 10/07/2017 1632   RDW 14.9 09/25/2017 1031   LYMPHSABS 2.3 09/25/2017 1031   MONOABS 0.6 09/28/2015 0929   EOSABS  0.5 (H) 09/25/2017 1031   BASOSABS 0.1 09/25/2017 1031   Iron/TIBC/Ferritin/ %Sat No results found for: IRON, TIBC, FERRITIN, IRONPCTSAT Lipid Panel     Component Value Date/Time   CHOL 178 09/25/2017 1031   TRIG 294 (H) 09/25/2017 1031   HDL 57 09/25/2017 1031   LDLCALC 62 09/25/2017 1031   Hepatic Function Panel     Component Value Date/Time   PROT 7.6 10/07/2017 1632   PROT 7.6 10/06/2017 1720   ALBUMIN 3.6 10/07/2017 1632   ALBUMIN 4.5 10/06/2017 1720   AST 97 (H) 10/07/2017 1632   ALT 171 (H) 10/07/2017 1632   ALKPHOS 113 10/07/2017 1632   BILITOT 1.7 (H) 10/07/2017 1632   BILITOT 2.0 (H) 10/06/2017 1720   BILIDIR 1.52 (H) 10/06/2017 1720      Component Value Date/Time   TSH 2.670 09/11/2017 1232   TSH 1.720 03/13/2017 1119   Results for MARILOUISE, DENSMORE (MRN [id number]) as of 10/09/2017 14:44  Ref. Range 09/25/2017 10:31  Vitamin D, 25-Hydroxy Latest Ref Range: 30.0 - 100.0 ng/mL 65.5   ASSESSMENT AND PLAN: Vitamin D deficiency - Plan: Vitamin D, Ergocalciferol, (DRISDOL) 50000 units CAPS capsule  Hypertriglyceridemia  Class 1 obesity with serious comorbidity and body mass index (BMI) of 31.0 to 31.9 in adult, unspecified obesity type  PLAN:  Vitamin D Deficiency Alixandrea was informed that low vitamin D levels contributes to fatigue and are associated with obesity, breast, and colon cancer. She agrees to continue to take prescription Vit D @50 ,000 IU every 14 days #2 with no refills and will follow up for routine testing of vitamin D, at least 2-3 times per year. She was informed of the risk of over-replacement of vitamin D and agrees to not increase her dose unless she discusses this with [place] first. Tabithia agrees to follow up with our clinic in 2 weeks.  Hypertriglyceridemia Karleigh was informed of the American Heart Association Guidelines emphasizing intensive lifestyle modifications as the first line treatment for hypertriglyceridemia. We discussed many lifestyle  modifications today in depth, and Shereda will continue to work on decreasing saturated fats such as fatty red meat, butter and many fried foods. She will also increase vegetables and lean protein in her diet and  continue to work on exercise and weight loss efforts. We will hold off of starting Tricor at this visit due to current gallbladder infection and elevated liver enzymes.  Obesity Kya is currently in the action stage of change. As such, her goal is to continue with weight loss efforts She has agreed to portion control better and make smarter food choices, such as increase vegetables and decrease simple carbohydrates  Krystine has been instructed to work up to a goal of 150 minutes of combined cardio and strengthening exercise per week for weight loss and overall health benefits. We discussed the following Behavioral Modification Strategies today: decreasing simple carbohydrates  and decrease junk food  Lekeshia has agreed to follow up with our clinic in 2 weeks. She was informed of the importance of frequent follow up visits to maximize her success with intensive lifestyle modifications for her multiple health conditions.   OBESITY BEHAVIORAL INTERVENTION VISIT  Today's visit was # 14 out of 22.  Starting weight: 181 lbs Starting date: 03/13/17 Today's weight : 170 lbs Today's date: 10/09/2017 Total lbs lost to date: 11 (Patients must lose 7 lbs in the first 6 months to continue with counseling)   ASK: We discussed the diagnosis of obesity with Mindi Curling today and Yanelie agreed to give [place] permission to discuss obesity behavioral modification therapy today.  ASSESS: Shay has the diagnosis of obesity and her BMI today is 31.09 Maryssa is in the action stage of change   ADVISE: Sarit was educated on the multiple health risks of obesity as well as the benefit of weight loss to improve her health. She was advised of the need for long term treatment and the importance of  lifestyle modifications.  AGREE: Multiple dietary modification options and treatment options were discussed and  Smera agreed to the above obesity treatment plan.   Cristi Loron, am acting as transcriptionist for [employer], PA-C I, [facility], have reviewed this note and agree with its content

## 2017-10-09 NOTE — Telephone Encounter (Signed)
Patient was seen 4/12 by dr. Ermalinda MemosBradshaw and seen 4/13 by the ER. States that she threw up once today and is now having some nausea. Wanting to know if she should go to the ER or can wait till her apt with Dr. Ermalinda MemosBradshaw 4/16.  Spoke with Dr. Louanne Skyeettinger since Dr. Ermalinda MemosBradshaw is out and he advised that I tell patient that if she throws up again that she needs to go to the ER- patient aware and verbalizes understanding.

## 2017-10-10 ENCOUNTER — Encounter: Payer: Self-pay | Admitting: Family Medicine

## 2017-10-10 ENCOUNTER — Ambulatory Visit: Payer: Medicaid Other | Admitting: Family Medicine

## 2017-10-10 VITALS — BP 121/85 | HR 81 | Temp 98.6°F | Ht 62.0 in | Wt 172.0 lb

## 2017-10-10 DIAGNOSIS — K8020 Calculus of gallbladder without cholecystitis without obstruction: Secondary | ICD-10-CM | POA: Diagnosis not present

## 2017-10-10 NOTE — Patient Instructions (Signed)
Great to see you!   Cholecystitis Cholecystitis is swelling and irritation (inflammation) of the gallbladder. The gallbladder is an organ that is shaped like a pear. It is under the liver on the right side of the body. This condition is often caused by gallstones. You doctor may do tests to see how your gallbladder works. These tests may include:  Imaging tests, such as: ? An ultrasound. ? MRI.  Tests that check how your liver works.  This condition needs treatment. Follow these instructions at home: Home care will depend on your treatment. In general:  Take over-the-counter and prescription medicines only as told by your doctor.  If you were prescribed an antibiotic medicine, take it as told by your doctor. Do not stop taking the antibiotic even if you start to feel better.  Follow instructions from your doctor about what to eat or drink. When you are allowed to eat, avoid eating or drinking anything that causes your symptoms to start.  Keep all follow-up visits as told by your doctor. This is important.  Contact a doctor if:  You have pain and your medicine does not help.  You have a fever. Get help right away if:  Your pain moves to: ? Another part of your belly (abdomen). ? Your back.  Your symptoms do not go away.  You have new symptoms. This information is not intended to replace advice given to you by your health care provider. Make sure you discuss any questions you have with your health care provider. Document Released: 06/02/2011 Document Revised: 11/19/2015 Document Reviewed: 09/24/2014 Elsevier Interactive Patient Education  2018 [employer]

## 2017-10-10 NOTE — Progress Notes (Signed)
   HPI  Patient presents today for ER follow-up.  Patient was seen in the emergency room with severe right upper quadrant pain.  She was found to have cholecystitis.  Her LFTs were elevated on her last visit, they were trending down at the ER visit.  She states that she has tolerated foods since she left the hospital, however she has had another episode of severe nausea, vomiting, and abdominal pain.  This was after eating a high fat meal.  Has been drinking lots of Gatorade today.    PMH: Smoking status noted ROS: Per HPI  Objective: BP 121/85 (BP Location: Left Arm)   Pulse 81   Temp 98.6 F (37 C) (Oral)   Ht 5\' 2"  (1.575 m)   Wt 172 lb (78 kg)   BMI 31.46 kg/m  Gen: NAD, alert, cooperative with exam HEENT: NCAT CV: RRR, good S1/S2, no murmur Resp: CTABL, no wheezes, non-labored Ext: No edema, warm Neuro: Alert and oriented, No gross deficits  Assessment and plan:  #cholelithiasis without cholecystitis Patient with elevated bilirubin and LFTs, improving at her ER visit She has a general surgery appointment scheduled We reviewed red flags and reasons to seek emergency medical care Return to clinic with any concerns    Murtis SinkSam Bradshaw, MD [facility] 10/10/2017, 2:33 PM

## 2017-10-23 ENCOUNTER — Encounter (INDEPENDENT_AMBULATORY_CARE_PROVIDER_SITE_OTHER): Payer: Self-pay

## 2017-10-23 ENCOUNTER — Ambulatory Visit (INDEPENDENT_AMBULATORY_CARE_PROVIDER_SITE_OTHER): Payer: Medicaid Other | Admitting: Physician Assistant

## 2017-10-23 ENCOUNTER — Ambulatory Visit: Payer: Self-pay | Admitting: Surgery

## 2017-10-23 DIAGNOSIS — K8020 Calculus of gallbladder without cholecystitis without obstruction: Secondary | ICD-10-CM | POA: Diagnosis not present

## 2017-10-23 NOTE — H&P (Signed)
Lauren Cardenas Documented: 10/23/2017 1:41 PM Location: [facility] Patient #: [id number] DOB: Oct 29, 1985 Married / Language: English / Race: White Female  History of Present Illness Lauren Fus A. Brennyn Haisley MD; 10/23/2017 2:04 PM) Patient words: Patient sent at the request of Dr. Chalmers Cater for episodes of epigastric and right upper quadrant abdominal pain. She is a multiple episodes of the last 6 months of epigastric abdominal pain. The pain radiates down under both rib cage. She had a severe attack this pain about 2-1/2 weeks ago prompting her to go to the emergency room. She complained of right upper quarter pain nausea and vomiting. The pain is made worse by eating fatty and greasy foods. There is radiation around both rib cage. The pain was severe sharp 7 out of 10 in intensity. It eventually got better with time and narcotics in the emergency room. She was discharged home for follow-up. She has had no severe attacks since that time. She is an low-fat diet which helps                   CLINICAL DATA: Abnormal liver function, RIGHT upper quadrant epigastric pain since Thursday  EXAM: ABDOMEN ULTRASOUND COMPLETE  COMPARISON: CT abdomen and pelvis 09/27/2015  FINDINGS: Gallbladder: Multiple shadowing calculi in gallbladder up to 6 mm diameter. No gallbladder wall thickening, pericholecystic fluid or sonographic Murphy sign.  Common bile duct: Diameter: CBD 5 mm diameter, normal  Liver: Normal appearance. No definite hepatic mass or nodularity. Portal vein is patent on color Doppler imaging with normal direction of blood flow towards the liver.  IVC: Normal appearance  Pancreas: Obscured by bowel gas  Spleen: Normal appearance, 6.9 cm length  Right Kidney: Length: 12.1 cm. Minimal cortical thinning without mass or hydronephrosis.  Left Kidney: Length: 10.6 cm. Minimal cortical thinning without mass or hydronephrosis. Nonshadowing echogenic focus  at inferior pole, cannot exclude an 8 mm nonobstructing calculus.  Abdominal aorta: Visualized portion normal appearance with remainder obscured by bowel gas  Other findings: No free fluid  IMPRESSION: Cholelithiasis without evidence of acute cholecystitis or biliary dilatation.  Questionable nonobstructing calculus 8 mm diameter at inferior pole LEFT kidney.  Incomplete pancreatic and aortic visualization.   Electronically Signed By: Ulyses Southward M.D. On: 10/07/2017 19:39       Results for Lauren Cardenas, Lauren Cardenas (MRN [id number]) as of 10/23/2017 13:53 Ref. Range 10/07/2017 16:32 Sodium Latest Ref Range: 135 - 145 mmol/L 137 Potassium Latest Ref Range: 3.5 - 5.1 mmol/L 3.6 Chloride Latest Ref Range: 101 - 111 mmol/L 105 CO2 Latest Ref Range: 22 - 32 mmol/L 23 Glucose Latest Ref Range: 65 - 99 mg/dL 562 (H) BUN Latest Ref Range: 6 - 20 mg/dL 14 Creatinine Latest Ref Range: 0.44 - 1.00 mg/dL 1.30 Calcium Latest Ref Range: 8.9 - 10.3 mg/dL 8.9 Anion gap Latest Ref Range: 5 - 15 9 Alkaline Phosphatase Latest Ref Range: 38 - 126 U/L 113 Albumin Latest Ref Range: 3.5 - 5.0 g/dL 3.6 Lipase Latest Ref Range: 11 - 51 U/L 34 AST Latest Ref Range: 15 - 41 U/L 97 (H) ALT Latest Ref Range: 14 - 54 U/L 171 (H) Total Protein Latest Ref Range: 6.5 - 8.1 g/dL 7.6 Total Bilirubin Latest Ref Range: 0.3 - 1.2 mg/dL 1.7 (H) GFR, Est Non African American Latest Ref Range: >60 mL/min >60 GFR, Est African American Latest Ref Range: >60 mL/min >60.  The patient is a 32 year old female.   Allergies Maurilio Lovely; 10/23/2017 1:50 PM) Amoxicillin *PENICILLINS*  Allergies Reconciled  Medication History Maurilio Lovely; 10/23/2017 1:50 PM) BuPROPion HCl ER (SR) (  Tablet ER 12HR, Oral) Active. MetFORMIN HCl (  Tablet, Oral) Active. Flonase (50MCG/ACT Suspension, Nasal) Active. Cyclobenzaprine HCl (  Tablet, Oral) Active. Tamsulosin HCl (0.4MG  Capsule, Oral)  Active. Levonorgest-Eth Estrad 91-Day (0.1-0.02 & 0.01MG  Tablet, Oral) Active. Medications Reconciled     Review of Systems Maurilio Lovely; 10/23/2017 1:42 PM) General Present- Appetite Loss. Not Present- Chills, Fatigue, Fever, Night Sweats, Weight Gain and Weight Loss. Skin Not Present- Change in Wart/Mole, Dryness, Hives, Jaundice, New Lesions, Non-Healing Wounds, Rash and Ulcer. HEENT Present- Ringing in the Ears. Not Present- Earache, Hearing Loss, Hoarseness, Nose Bleed, Oral Ulcers, Seasonal Allergies, Sinus Pain, Sore Throat, Visual Disturbances, Wears glasses/contact lenses and Yellow Eyes. Respiratory Present- Snoring. Not Present- Bloody sputum, Chronic Cough, Difficulty Breathing and Wheezing. Breast Not Present- Breast Mass, Breast Pain, Nipple Discharge and Skin Changes. Cardiovascular Present- Palpitations. Not Present- Chest Pain, Difficulty Breathing Lying Down, Leg Cramps, Rapid Heart Rate, Shortness of Breath and Swelling of Extremities. Gastrointestinal Present- Abdominal Pain and Indigestion. Not Present- Bloating, Bloody Stool, Change in Bowel Habits, Chronic diarrhea, Constipation, Difficulty Swallowing, Excessive gas, Gets full quickly at meals, Hemorrhoids, Nausea, Rectal Pain and Vomiting. Female Genitourinary Present- Frequency, Painful Urination and Urgency. Not Present- Nocturia and Pelvic Pain. Musculoskeletal Present- Back Pain. Not Present- Joint Pain, Joint Stiffness, Muscle Pain, Muscle Weakness and Swelling of Extremities. Neurological Present- Headaches. Not Present- Decreased Memory, Fainting, Numbness, Seizures, Tingling, Tremor, Trouble walking and Weakness. Psychiatric Present- Anxiety and Fearful. Not Present- Bipolar, Change in Sleep Pattern, Depression and Frequent crying. Endocrine Present- Heat Intolerance. Not Present- Cold Intolerance, Excessive Hunger, Hair Changes, Hot flashes and New Diabetes. Hematology Not Present- Blood Thinners, Easy  Bruising, Excessive bleeding, Gland problems, HIV and Persistent Infections.  Vitals Maurilio Lovely; 10/23/2017 1:50 PM) 10/23/2017 1:49 PM Weight: 177.25 lb Height: 62in Body Surface Area: 1.82 m Body Mass Index: 32.42 kg/m  Temp.: 99.37F(Oral)  Pulse: 80 (Regular)  BP: 110/80 (Sitting, Left Arm, Standard)      Physical Exam (Daylani Deblois A. Ellary Casamento MD; 10/23/2017 2:04 PM)  General Mental Status-Alert. General Appearance-Consistent with stated age. Hydration-Well hydrated. Voice-Normal.  Head and Neck Head-normocephalic, atraumatic with no lesions or palpable masses.  Eye Eyeball - Bilateral-Extraocular movements intact. Sclera/Conjunctiva - Bilateral-No scleral icterus.  Chest and Lung Exam Chest and lung exam reveals -quiet, even and easy respiratory effort with no use of accessory muscles and on auscultation, normal breath sounds, no adventitious sounds and normal vocal resonance. Inspection Chest Wall - Normal. Back - normal.  Cardiovascular Cardiovascular examination reveals -on palpation PMI is normal in location and amplitude, no palpable S3 or S4. Normal cardiac borders., normal heart sounds, regular rate and rhythm with no murmurs, carotid auscultation reveals no bruits and normal pedal pulses bilaterally.  Abdomen Inspection Inspection of the abdomen reveals - No Hernias. Skin - Scar - no surgical scars. Palpation/Percussion Palpation and Percussion of the abdomen reveal - Soft, Non Tender, No Rebound tenderness, No Rigidity (guarding) and No hepatosplenomegaly. Auscultation Auscultation of the abdomen reveals - Bowel sounds normal.  Neurologic Neurologic evaluation reveals -alert and oriented x 3 with no impairment of recent or remote memory. Mental Status-Normal.  Musculoskeletal Normal Exam - Left-Upper Extremity Strength Normal and Lower Extremity Strength Normal. Normal Exam - Right-Upper Extremity Strength Normal,  Lower Extremity Weakness.    Assessment & Plan (Cecilio Ohlrich A. Lloyd Cullinan MD; 10/23/2017 2:04 PM)  SYMPTOMATIC CHOLELITHIASIS (K80.20) Impression: The procedure has been discussed with the  patient. Risks of laparoscopic cholecystectomy include bleeding, infection, bile duct injury, leak, death, open surgery, diarrhea, other surgery, organ injury, blood vessel injury, DVT, and additional care. Discussed medical and surgical treatments of symptomatic cholelithiasis. Pros and cons of each discussed. Risk and benefits discussed. She is opted for laparoscopic cholecystectomy with cholangiogram.  Current Plans You are being scheduled for surgery- Our schedulers will call you.  You should hear from our office's scheduling department within 5 working days about the location, date, and time of surgery. We try to make accommodations for patient's preferences in scheduling surgery, but sometimes the OR schedule or the surgeon's schedule prevents [place] from making those accommodations.  If you have not heard from our office [phone]) in 5 working days, call the office and ask for your surgeon's nurse.  If you have other questions about your diagnosis, plan, or surgery, call the office and ask for your surgeon's nurse.  Pt Education - Pamphlet Given - Laparoscopic Gallbladder Surgery: discussed with patient and provided information. The anatomy & physiology of hepatobiliary & pancreatic function was discussed. The pathophysiology of gallbladder dysfunction was discussed. Natural history risks without surgery was discussed. I feel the risks of no intervention will lead to serious problems that outweigh the operative risks; therefore, I recommended cholecystectomy to remove the pathology. I explained laparoscopic techniques with possible need for an open approach. Probable cholangiogram to evaluate the bilary tract was explained as well.  Risks such as bleeding, infection, abscess, leak, injury to other  organs, need for further treatment, heart attack, death, and other risks were discussed. I noted a good likelihood this will help address the problem. Possibility that this will not correct all abdominal symptoms was explained. Goals of post-operative recovery were discussed as well. We will work to minimize complications. An educational handout further explaining the pathology and treatment options was given as well. Questions were answered. The patient expresses understanding & wishes to proceed with surgery.  Pt Education - [facility] Laparosopic Post Op HCI Pt Education - Laparoscopic Cholecystectomy: gallbladder

## 2017-10-23 NOTE — H&P (Signed)
CLINICAL DATA:  Abnormal liver function, RIGHT upper quadrant epigastric pain since Thursday  EXAM: ABDOMEN ULTRASOUND COMPLETE  COMPARISON:  CT abdomen and pelvis 09/27/2015  FINDINGS: Gallbladder: Multiple shadowing calculi in gallbladder up to 6 mm diameter. No gallbladder wall thickening, pericholecystic fluid or sonographic Murphy sign.  Common bile duct: Diameter: CBD 5 mm diameter, normal  Liver: Normal appearance. No definite hepatic mass or nodularity. Portal vein is patent on color Doppler imaging with normal direction of blood flow towards the liver.  IVC: Normal appearance  Pancreas: Obscured by bowel gas  Spleen: Normal appearance, 6.9 cm length  Right Kidney: Length: 12.1 cm. Minimal cortical thinning without mass or hydronephrosis.  Left Kidney: Length: 10.6 cm. Minimal cortical thinning without mass or hydronephrosis. Nonshadowing echogenic focus at inferior pole, cannot exclude an 8 mm nonobstructing calculus.  Abdominal aorta: Visualized portion normal appearance with remainder obscured by bowel gas  Other findings: No free fluid  IMPRESSION: Cholelithiasis without evidence of acute cholecystitis or biliary dilatation.  Questionable nonobstructing calculus 8 mm diameter at inferior pole LEFT kidney.  Incomplete pancreatic and aortic visualization.   Electronically Signed   By: Ulyses Southward M.D.   On: 10/07/2017 19:39

## 2017-10-23 NOTE — H&P (View-Only) (Signed)
CLINICAL DATA:  Abnormal liver function, RIGHT upper quadrant epigastric pain since Thursday  EXAM: ABDOMEN ULTRASOUND COMPLETE  COMPARISON:  CT abdomen and pelvis 09/27/2015  FINDINGS: Gallbladder: Multiple shadowing calculi in gallbladder up to 6 mm diameter. No gallbladder wall thickening, pericholecystic fluid or sonographic Murphy sign.  Common bile duct: Diameter: CBD 5 mm diameter, normal  Liver: Normal appearance. No definite hepatic mass or nodularity. Portal vein is patent on color Doppler imaging with normal direction of blood flow towards the liver.  IVC: Normal appearance  Pancreas: Obscured by bowel gas  Spleen: Normal appearance, 6.9 cm length  Right Kidney: Length: 12.1 cm. Minimal cortical thinning without mass or hydronephrosis.  Left Kidney: Length: 10.6 cm. Minimal cortical thinning without mass or hydronephrosis. Nonshadowing echogenic focus at inferior pole, cannot exclude an 8 mm nonobstructing calculus.  Abdominal aorta: Visualized portion normal appearance with remainder obscured by bowel gas  Other findings: No free fluid  IMPRESSION: Cholelithiasis without evidence of acute cholecystitis or biliary dilatation.  Questionable nonobstructing calculus 8 mm diameter at inferior pole LEFT kidney.  Incomplete pancreatic and aortic visualization.   Electronically Signed   By: Mark  Boles M.D.   On: 10/07/2017 19:39 

## 2017-10-24 ENCOUNTER — Encounter (HOSPITAL_COMMUNITY): Payer: Self-pay | Admitting: *Deleted

## 2017-10-24 ENCOUNTER — Other Ambulatory Visit: Payer: Self-pay

## 2017-10-24 NOTE — Progress Notes (Signed)
Spoke with pt for pre-op call. Pt denies cardiac history. Pt is pre-Diabetic. Last A1C was 5.8 on 09/25/17. Pt states she does not check her blood sugar at home.

## 2017-10-26 ENCOUNTER — Ambulatory Visit (HOSPITAL_COMMUNITY)
Admission: RE | Admit: 2017-10-26 | Discharge: 2017-10-26 | Disposition: A | Discharge status: Home / Self Care | Payer: Medicaid Other | Source: Ambulatory Visit | Attending: Surgery | Admitting: Surgery

## 2017-10-26 ENCOUNTER — Ambulatory Visit (HOSPITAL_COMMUNITY): Payer: Medicaid Other | Admitting: Emergency Medicine

## 2017-10-26 ENCOUNTER — Encounter (HOSPITAL_COMMUNITY)
Admission: RE | Disposition: A | Discharge status: Home / Self Care | Payer: Self-pay | Source: Ambulatory Visit | Attending: Surgery

## 2017-10-26 ENCOUNTER — Encounter (HOSPITAL_COMMUNITY): Payer: Self-pay | Admitting: Anesthesiology

## 2017-10-26 DIAGNOSIS — F329 Major depressive disorder, single episode, unspecified: Secondary | ICD-10-CM | POA: Diagnosis not present

## 2017-10-26 DIAGNOSIS — Z79899 Other long term (current) drug therapy: Secondary | ICD-10-CM | POA: Diagnosis not present

## 2017-10-26 DIAGNOSIS — Z7984 Long term (current) use of oral hypoglycemic drugs: Secondary | ICD-10-CM | POA: Diagnosis not present

## 2017-10-26 DIAGNOSIS — K8010 Calculus of gallbladder with chronic cholecystitis without obstruction: Secondary | ICD-10-CM | POA: Diagnosis not present

## 2017-10-26 DIAGNOSIS — Z880 Allergy status to penicillin: Secondary | ICD-10-CM | POA: Insufficient documentation

## 2017-10-26 DIAGNOSIS — K8080 Other cholelithiasis without obstruction: Secondary | ICD-10-CM | POA: Diagnosis present

## 2017-10-26 DIAGNOSIS — Z6832 Body mass index (BMI) 32.0-32.9, adult: Secondary | ICD-10-CM | POA: Diagnosis not present

## 2017-10-26 DIAGNOSIS — Z888 Allergy status to other drugs, medicaments and biological substances status: Secondary | ICD-10-CM | POA: Insufficient documentation

## 2017-10-26 DIAGNOSIS — I10 Essential (primary) hypertension: Secondary | ICD-10-CM | POA: Diagnosis not present

## 2017-10-26 HISTORY — DX: Prediabetes: R73.03

## 2017-10-26 HISTORY — PX: CHOLECYSTECTOMY: SHX55

## 2017-10-26 LAB — GLUCOSE, CAPILLARY
Glucose-Capillary: 68 mg/dL (ref 65–99)
Glucose-Capillary: 116 mg/dL — ABNORMAL HIGH (ref 65–99)

## 2017-10-26 LAB — COMPREHENSIVE METABOLIC PANEL
ALK PHOS: 59 U/L (ref 38–126)
ALT: 40 U/L (ref 14–54)
AST: 37 U/L (ref 15–41)
Albumin: 3.7 g/dL (ref 3.5–5.0)
Anion gap: 11 (ref 5–15)
BUN: 12 mg/dL (ref 6–20)
CALCIUM: 8.9 mg/dL (ref 8.9–10.3)
CO2: 23 mmol/L (ref 22–32)
CREATININE: 0.73 mg/dL (ref 0.44–1.00)
Chloride: 108 mmol/L (ref 101–111)
Glucose, Bld: 74 mg/dL (ref 65–99)
Potassium: 4 mmol/L (ref 3.5–5.1)
SODIUM: 142 mmol/L (ref 135–145)
Total Bilirubin: 0.7 mg/dL (ref 0.3–1.2)
Total Protein: 7.3 g/dL (ref 6.5–8.1)

## 2017-10-26 LAB — CBC WITH DIFFERENTIAL/PLATELET
BASOS ABS: 0 10*3/uL (ref 0.0–0.1)
Basophils Relative: 0 %
Eosinophils Absolute: 0.4 10*3/uL (ref 0.0–0.7)
Eosinophils Relative: 4 %
HCT: 39.8 % (ref 36.0–46.0)
HEMOGLOBIN: 12.6 g/dL (ref 12.0–15.0)
LYMPHS ABS: 3.1 10*3/uL (ref 0.7–4.0)
Lymphocytes Relative: 29 %
MCH: 26.5 pg (ref 26.0–34.0)
MCHC: 31.7 g/dL (ref 30.0–36.0)
MCV: 83.6 fL (ref 78.0–100.0)
Monocytes Absolute: 0.7 10*3/uL (ref 0.1–1.0)
Monocytes Relative: 7 %
NEUTROS PCT: 60 %
Neutro Abs: 6.5 10*3/uL (ref 1.7–7.7)
Platelets: 215 10*3/uL (ref 150–400)
RBC: 4.76 MIL/uL (ref 3.87–5.11)
RDW: 13.8 % (ref 11.5–15.5)
WBC: 10.7 10*3/uL — AB (ref 4.0–10.5)

## 2017-10-26 LAB — POCT PREGNANCY, URINE: PREG TEST UR: NEGATIVE

## 2017-10-26 SURGERY — LAPAROSCOPIC CHOLECYSTECTOMY WITH INTRAOPERATIVE CHOLANGIOGRAM
Anesthesia: General

## 2017-10-26 MED ORDER — GLYCOPYRROLATE 0.2 MG/ML IJ SOLN
INTRAMUSCULAR | Status: DC | PRN
  Administered 2017-10-26: .6 mg via INTRAVENOUS

## 2017-10-26 MED ORDER — FENTANYL CITRATE (PF) 250 MCG/5ML IJ SOLN
INTRAMUSCULAR | Status: AC
  Filled 2017-10-26: qty 5

## 2017-10-26 MED ORDER — PROPOFOL 10 MG/ML IV BOLUS
INTRAVENOUS | Status: DC | PRN
  Administered 2017-10-26: 200 mg via INTRAVENOUS

## 2017-10-26 MED ORDER — CHLORHEXIDINE GLUCONATE CLOTH 2 % EX PADS: 6.0000 | MEDICATED_PAD | Freq: Once | CUTANEOUS | Status: DC

## 2017-10-26 MED ORDER — IBUPROFEN 800 MG PO TABS
800.0000 mg | ORAL_TABLET | Freq: Three times a day (TID) | ORAL | 0 refills | Status: DC | PRN
Start: 2017-10-26 — End: 2017-11-09

## 2017-10-26 MED ORDER — OXYCODONE HCL 5 MG PO TABS: 5.0000 mg | ORAL_TABLET | Freq: Four times a day (QID) | ORAL | 0 refills | Status: DC | PRN

## 2017-10-26 MED ORDER — HYDROMORPHONE HCL 2 MG/ML IJ SOLN: 0.2500 mg | INTRAMUSCULAR | Status: DC | PRN

## 2017-10-26 MED ORDER — BUPIVACAINE-EPINEPHRINE 0.25% -1:200000 IJ SOLN
INTRAMUSCULAR | Status: DC | PRN
  Administered 2017-10-26: 8 mL

## 2017-10-26 MED ORDER — MIDAZOLAM HCL 2 MG/2ML IJ SOLN
INTRAMUSCULAR | Status: AC
  Filled 2017-10-26: qty 2

## 2017-10-26 MED ORDER — LACTATED RINGERS IV SOLN
INTRAVENOUS | Status: DC
  Administered 2017-10-26: 10 mL/h via INTRAVENOUS

## 2017-10-26 MED ORDER — BUPIVACAINE-EPINEPHRINE (PF) 0.25% -1:200000 IJ SOLN
INTRAMUSCULAR | Status: AC
  Filled 2017-10-26: qty 30

## 2017-10-26 MED ORDER — ONDANSETRON HCL 4 MG/2ML IJ SOLN
INTRAMUSCULAR | Status: DC | PRN
  Administered 2017-10-26: 4 mg via INTRAVENOUS

## 2017-10-26 MED ORDER — PROPRANOLOL HCL ER 120 MG PO CP24
120.0000 mg | ORAL_CAPSULE | Freq: Every day | ORAL | Status: DC
  Administered 2017-10-26: 120 mg via ORAL
  Filled 2017-10-26: qty 1

## 2017-10-26 MED ORDER — IOPAMIDOL (ISOVUE-300) INJECTION 61%
INTRAVENOUS | Status: AC
  Filled 2017-10-26: qty 50

## 2017-10-26 MED ORDER — ONDANSETRON HCL 4 MG/2ML IJ SOLN
INTRAMUSCULAR | Status: AC
  Filled 2017-10-26: qty 2

## 2017-10-26 MED ORDER — LIDOCAINE 2% (20 MG/ML) 5 ML SYRINGE
INTRAMUSCULAR | Status: DC | PRN
  Administered 2017-10-26: 100 mg via INTRAVENOUS

## 2017-10-26 MED ORDER — SCOPOLAMINE 1 MG/3DAYS TD PT72
MEDICATED_PATCH | TRANSDERMAL | Status: AC
  Filled 2017-10-26: qty 1

## 2017-10-26 MED ORDER — FENTANYL CITRATE (PF) 100 MCG/2ML IJ SOLN
INTRAMUSCULAR | Status: DC | PRN
  Administered 2017-10-26: 150 ug via INTRAVENOUS
  Administered 2017-10-26: 50 ug via INTRAVENOUS

## 2017-10-26 MED ORDER — ROCURONIUM BROMIDE 100 MG/10ML IV SOLN
INTRAVENOUS | Status: DC | PRN
  Administered 2017-10-26: 50 mg via INTRAVENOUS

## 2017-10-26 MED ORDER — GABAPENTIN 300 MG PO CAPS
ORAL_CAPSULE | ORAL | Status: AC
  Filled 2017-10-26: qty 1

## 2017-10-26 MED ORDER — DEXAMETHASONE SODIUM PHOSPHATE 10 MG/ML IJ SOLN
INTRAMUSCULAR | Status: DC | PRN
  Administered 2017-10-26: 5 mg via INTRAVENOUS

## 2017-10-26 MED ORDER — MEPERIDINE HCL 50 MG/ML IJ SOLN
6.2500 mg | INTRAMUSCULAR | Status: DC | PRN
Start: 2017-10-26 — End: 2017-10-26

## 2017-10-26 MED ORDER — PROMETHAZINE HCL 25 MG/ML IJ SOLN: 6.2500 mg | INTRAMUSCULAR | Status: DC | PRN

## 2017-10-26 MED ORDER — SCOPOLAMINE 1 MG/3DAYS TD PT72
MEDICATED_PATCH | TRANSDERMAL | Status: DC | PRN
  Administered 2017-10-26: 1 via TRANSDERMAL

## 2017-10-26 MED ORDER — CELECOXIB 200 MG PO CAPS
ORAL_CAPSULE | ORAL | Status: AC
  Administered 2017-10-26: 200 mg
  Filled 2017-10-26: qty 1

## 2017-10-26 MED ORDER — CELECOXIB 200 MG PO CAPS: 200.0000 mg | ORAL_CAPSULE | ORAL | Status: DC

## 2017-10-26 MED ORDER — ACETAMINOPHEN 500 MG PO TABS
ORAL_TABLET | ORAL | Status: AC
  Filled 2017-10-26: qty 2

## 2017-10-26 MED ORDER — MIDAZOLAM HCL 5 MG/5ML IJ SOLN
INTRAMUSCULAR | Status: DC | PRN
  Administered 2017-10-26: 2 mg via INTRAVENOUS

## 2017-10-26 MED ORDER — CLINDAMYCIN PHOSPHATE 900 MG/50ML IV SOLN
900.0000 mg | INTRAVENOUS | Status: AC
  Administered 2017-10-26: 900 mg via INTRAVENOUS

## 2017-10-26 MED ORDER — SODIUM CHLORIDE 0.9 % IV SOLN: INTRAVENOUS | Status: DC | PRN

## 2017-10-26 MED ORDER — 0.9 % SODIUM CHLORIDE (POUR BTL) OPTIME
TOPICAL | Status: DC | PRN
  Administered 2017-10-26: 1000 mL

## 2017-10-26 MED ORDER — DEXAMETHASONE SODIUM PHOSPHATE 10 MG/ML IJ SOLN
INTRAMUSCULAR | Status: AC
  Filled 2017-10-26: qty 1

## 2017-10-26 MED ORDER — DEXTROSE 50 % IV SOLN
INTRAVENOUS | Status: AC
  Filled 2017-10-26: qty 50

## 2017-10-26 MED ORDER — MIDAZOLAM HCL 2 MG/2ML IJ SOLN: 0.5000 mg | Freq: Once | INTRAMUSCULAR | Status: DC | PRN

## 2017-10-26 MED ORDER — CLINDAMYCIN PHOSPHATE 900 MG/50ML IV SOLN
INTRAVENOUS | Status: AC
  Filled 2017-10-26: qty 50

## 2017-10-26 MED ORDER — NEOSTIGMINE METHYLSULFATE 10 MG/10ML IV SOLN
INTRAVENOUS | Status: DC | PRN
  Administered 2017-10-26: 4 mg via INTRAVENOUS

## 2017-10-26 MED ORDER — PROPRANOLOL HCL 60 MG PO TABS
120.0000 mg | ORAL_TABLET | Freq: Once | ORAL | Status: DC
  Filled 2017-10-26: qty 2

## 2017-10-26 MED ORDER — ACETAMINOPHEN 500 MG PO TABS
1000.0000 mg | ORAL_TABLET | ORAL | Status: AC
  Administered 2017-10-26: 1000 mg via ORAL

## 2017-10-26 MED ORDER — DEXTROSE 50 % IV SOLN
25.0000 mL | Freq: Once | INTRAVENOUS | Status: AC
  Administered 2017-10-26: 25 mL via INTRAVENOUS
  Filled 2017-10-26: qty 50

## 2017-10-26 MED ORDER — GABAPENTIN 300 MG PO CAPS
300.0000 mg | ORAL_CAPSULE | ORAL | Status: AC
  Administered 2017-10-26: 300 mg via ORAL

## 2017-10-26 MED ORDER — NEOSTIGMINE METHYLSULFATE 5 MG/5ML IV SOSY
PREFILLED_SYRINGE | INTRAVENOUS | Status: AC
  Filled 2017-10-26: qty 5

## 2017-10-26 SURGICAL SUPPLY — 43 items
ADH SKN CLS APL DERMABOND .7 (GAUZE/BANDAGES/DRESSINGS) ×1
APPLIER CLIP ROT 10 11.4 M/L (STAPLE) ×3
APR CLP MED LRG 11.4X10 (STAPLE) ×1
BAG SPEC RTRVL LRG 6X4 10 (ENDOMECHANICALS) ×1
BLADE CLIPPER SURG (BLADE) IMPLANT
CANISTER SUCT 3000ML PPV (MISCELLANEOUS) ×3 IMPLANT
CHLORAPREP W/TINT 26ML (MISCELLANEOUS) ×3 IMPLANT
CLIP APPLIE ROT 10 11.4 M/L (STAPLE) ×1 IMPLANT
COVER MAYO STAND STRL (DRAPES) ×3 IMPLANT
COVER SURGICAL LIGHT HANDLE (MISCELLANEOUS) ×3 IMPLANT
DERMABOND ADHESIVE PROPEN (GAUZE/BANDAGES/DRESSINGS) ×2
DERMABOND ADVANCED (GAUZE/BANDAGES/DRESSINGS) ×2
DERMABOND ADVANCED .7 DNX12 (GAUZE/BANDAGES/DRESSINGS) ×1 IMPLANT
DERMABOND ADVANCED .7 DNX6 (GAUZE/BANDAGES/DRESSINGS) ×1 IMPLANT
DRAPE C-ARM 42X72 X-RAY (DRAPES) ×3 IMPLANT
ELECT REM PT RETURN 9FT ADLT (ELECTROSURGICAL) ×3
ELECTRODE REM PT RTRN 9FT ADLT (ELECTROSURGICAL) ×1 IMPLANT
GLOVE BIO SURGEON STRL SZ8 (GLOVE) ×3 IMPLANT
GLOVE BIOGEL PI IND STRL 8 (GLOVE) ×1 IMPLANT
GLOVE BIOGEL PI INDICATOR 8 (GLOVE) ×2
GOWN STRL REUS W/ TWL LRG LVL3 (GOWN DISPOSABLE) ×2 IMPLANT
GOWN STRL REUS W/ TWL XL LVL3 (GOWN DISPOSABLE) ×1 IMPLANT
GOWN STRL REUS W/TWL LRG LVL3 (GOWN DISPOSABLE) ×6
GOWN STRL REUS W/TWL XL LVL3 (GOWN DISPOSABLE) ×2
KIT BASIN OR (CUSTOM PROCEDURE TRAY) ×3 IMPLANT
KIT TURNOVER KIT B (KITS) ×3 IMPLANT
NS IRRIG 1000ML POUR BTL (IV SOLUTION) ×3 IMPLANT
PAD ARMBOARD 7.5X6 YLW CONV (MISCELLANEOUS) ×3 IMPLANT
POUCH SPECIMEN RETRIEVAL 10MM (ENDOMECHANICALS) ×3 IMPLANT
SCISSORS LAP 5X35 DISP (ENDOMECHANICALS) ×3 IMPLANT
SET CHOLANGIOGRAPH 5 50 .035 (SET/KITS/TRAYS/PACK) ×3 IMPLANT
SET IRRIG TUBING LAPAROSCOPIC (IRRIGATION / IRRIGATOR) ×3 IMPLANT
SLEEVE ENDOPATH XCEL 5M (ENDOMECHANICALS) ×3 IMPLANT
SPECIMEN JAR SMALL (MISCELLANEOUS) ×3 IMPLANT
SUT MNCRL AB 4-0 PS2 18 (SUTURE) ×3 IMPLANT
TOWEL OR 17X24 6PK STRL BLUE (TOWEL DISPOSABLE) ×3 IMPLANT
TOWEL OR 17X26 10 PK STRL BLUE (TOWEL DISPOSABLE) ×3 IMPLANT
TRAY LAPAROSCOPIC MC (CUSTOM PROCEDURE TRAY) ×3 IMPLANT
TROCAR XCEL BLUNT TIP 100MML (ENDOMECHANICALS) ×3 IMPLANT
TROCAR XCEL NON-BLD 11X100MML (ENDOMECHANICALS) ×3 IMPLANT
TROCAR XCEL NON-BLD 5MMX100MML (ENDOMECHANICALS) ×3 IMPLANT
TUBING INSUFFLATION (TUBING) ×3 IMPLANT
WATER STERILE IRR 1000ML POUR (IV SOLUTION) ×3 IMPLANT

## 2017-10-26 NOTE — Anesthesia Postprocedure Evaluation (Signed)
Anesthesia Post Note  Patient: Lauren Cardenas  Procedure(s) Performed: LAPAROSCOPIC CHOLECYSTECTOMY WITH INTRAOPERATIVE CHOLANGIOGRAM ERAS PATHWAY (N/A )     Patient location during evaluation: PACU Anesthesia Type: General Level of consciousness: awake and alert, oriented and patient cooperative Pain management: pain level controlled Vital Signs Assessment: post-procedure vital signs reviewed and stable Respiratory status: spontaneous breathing, nonlabored ventilation and respiratory function stable Cardiovascular status: blood pressure returned to baseline and stable Postop Assessment: no apparent nausea or vomiting Anesthetic complications: no    Last Vitals:  Vitals:   10/26/17 1700 10/26/17 1715  BP:    Pulse: 71 66  Resp: 10 11  Temp:    SpO2: 98% 90%    Last Pain:  Vitals:   10/26/17 1700  TempSrc:   PainSc: 0-No pain                 Naithan Delage,E. Carlei Huang

## 2017-10-26 NOTE — Op Note (Signed)
Laparoscopic Cholecystectomy with IOC Procedure Note  Indications: This patient presents with symptomatic gallbladder disease and will undergo laparoscopic cholecystectomy.  Pre-operative Diagnosis: Calculus of gallbladder without mention of cholecystitis or obstruction  Post-operative Diagnosis: Same  Surgeon: Clovis Pu Jadalynn Burr   Assistants: Orson Slick RNFA  Anesthesia: General endotracheal anesthesia and Local anesthesia 0.25.% bupivacaine, with epinephrine  ASA Class: 2  Procedure Details  The patient was seen again in the Holding Room. The risks, benefits, complications, treatment options, and expected outcomes were discussed with the patient. The possibilities of reaction to medication, pulmonary aspiration, perforation of viscus, bleeding, recurrent infection, finding a normal gallbladder, the need for additional procedures, failure to diagnose a condition, the possible need to convert to an open procedure, and creating a complication requiring transfusion or operation were discussed with the patient. The patient and/or family concurred with the proposed plan, giving informed consent. The site of surgery properly noted/marked. The patient was taken to Operating Room, identified as Mindi Curling and the procedure verified as Laparoscopic Cholecystectomy with Intraoperative Cholangiograms. A Time Out was held and the above information confirmed.  Prior to the induction of general anesthesia, antibiotic prophylaxis was administered. General endotracheal anesthesia was then administered and tolerated well. After the induction, the abdomen was prepped in the usual sterile fashion. The patient was positioned in the supine position with the left arm comfortably tucked, along with some reverse Trendelenburg.  Local anesthetic agent was injected into the skin near the umbilicus and an incision made. The midline fascia was incised and the Hasson technique was used to introduce a 12 mm port under  direct vision. It was secured with a figure of eight Vicryl suture placed in the usual fashion. Pneumoperitoneum was then created with CO2 and tolerated well without any adverse changes in the patient's vital signs. Additional trocars were introduced under direct vision with an 11 mm trocar in the epigastrium and 2 5 mm trocars in the right upper quadrant. All skin incisions were infiltrated with a local anesthetic agent before making the incision and placing the trocars.   The gallbladder was identified, the fundus grasped and retracted cephalad. It was very small and about half the size of a typical gallbladder.   Adhesions were lysed bluntly and with the electrocautery where indicated, taking care not to injure any adjacent organs or viscus. The infundibulum was grasped and retracted laterally, exposing the peritoneum overlying the triangle of Calot. This was then divided and exposed in a blunt fashion. The cystic duct was clearly identified and bluntly dissected circumferentially. The junctions of the gallbladder, cystic duct and common bile duct were clearly identified prior to the division of any linear structure.   An incision was made in the cystic duct but the cystic duct was very small and would not accommodate a catheter.  Cholangiogram was not performed.  Critical view was obtained and the common bile duct was seen and out of the operative field.    The cystic duct was then  ligated with surgical clips  on the patient side and  clipped on the gallbladder side and divided. The cystic artery was identified, dissected free, ligated with clips and divided as well. Posterior cystic artery clipped and divided.  The gallbladder was dissected from the liver bed in retrograde fashion with the electrocautery. The gallbladder was removed. The liver bed was irrigated and inspected. Hemostasis was achieved with the electrocautery. Copious irrigation was utilized and was repeatedly aspirated until clear all  particulate matter. Hemostasis was achieved  with no signs of  bleeding or bile leakage.  Pneumoperitoneum was completely reduced after viewing removal of the trocars under direct vision. The wound was thoroughly irrigated and the fascia was then closed with a figure of eight suture; the skin was then closed with 4 0 MONOCRYL  and a sterile dressing  Of dermabond was applied.  Instrument, sponge, and needle counts were correct at closure and at the conclusion of the case.   Findings: Cholelithiasis  Estimated Blood Loss: less than 50 mL         Drains: none          Total IV Fluids: per anesthesia record.          Specimens: Gallbladder           Complications: None; patient tolerated the procedure well.         Disposition: PACU - hemodynamically stable.         Condition: stable

## 2017-10-26 NOTE — Interval H&P Note (Signed)
History and Physical Interval Note:  10/26/2017 3:00 PM  Lauren Cardenas  has presented today for surgery, with the diagnosis of Gallstones  The various methods of treatment have been discussed with the patient and family. After consideration of risks, benefits and other options for treatment, the patient has consented to  Procedure(s): LAPAROSCOPIC CHOLECYSTECTOMY WITH INTRAOPERATIVE CHOLANGIOGRAM ERAS PATHWAY (N/A) as a surgical intervention .  The patient's history has been reviewed, patient examined, no change in status, stable for surgery.  I have reviewed the patient's chart and labs.  Questions were answered to the patient's satisfaction.     Azlan Hanway A Eliott Amparan

## 2017-10-26 NOTE — Transfer of Care (Signed)
Immediate Anesthesia Transfer of Care Note  Patient: Lauren Cardenas  Procedure(s) Performed: LAPAROSCOPIC CHOLECYSTECTOMY WITH INTRAOPERATIVE CHOLANGIOGRAM ERAS PATHWAY (N/A )  Patient Location: PACU  Anesthesia Type:General  Level of Consciousness: awake and patient cooperative  Airway & Oxygen Therapy: Patient Spontanous Breathing  Post-op Assessment: Report given to RN and Post -op Vital signs reviewed and stable  Post vital signs: Reviewed and stable  Last Vitals:  Vitals Value Taken Time  BP 112/74 10/26/2017  4:36 PM  Temp 36.9 C 10/26/2017  4:36 PM  Pulse 94 10/26/2017  4:37 PM  Resp 16 10/26/2017  4:37 PM  SpO2 95 % 10/26/2017  4:37 PM  Vitals shown include unvalidated device data.  Last Pain:  Vitals:   10/26/17 1327  TempSrc:   PainSc: 0-No pain      Patients Stated Pain Goal: 3 (10/26/17 1327)  Complications: No apparent anesthesia complications

## 2017-10-26 NOTE — Discharge Instructions (Signed)
[facility] ______[facility] °LAPAROSCOPIC SURGERY: POST OP INSTRUCTIONS °Always review your discharge instruction sheet given to you by the facility where your surgery was performed. °IF YOU HAVE DISABILITY OR FAMILY LEAVE FORMS, YOU MUST BRING THEM TO THE OFFICE FOR PROCESSING.   °DO NOT GIVE THEM TO YOUR DOCTOR. ° °1. A prescription for pain medication may be given to you upon discharge.  Take your pain medication as prescribed, if needed.  If narcotic pain medicine is not needed, then you may take acetaminophen (Tylenol) or ibuprofen (Advil) as needed. °2. Take your usually prescribed medications unless otherwise directed. °3. If you need a refill on your pain medication, please contact your pharmacy.  They will contact our office to request authorization. Prescriptions will not be filled after 5pm or on week-ends. °4. You should follow a light diet the first few days after arrival home, such as soup and crackers, etc.  Be sure to include lots of fluids daily. °5. Most patients will experience some swelling and bruising in the area of the incisions.  Ice packs will help.  Swelling and bruising can take several days to resolve.  °6. It is common to experience some constipation if taking pain medication after surgery.  Increasing fluid intake and taking a stool softener (such as Colace) will usually help or prevent this problem from occurring.  A mild laxative (Milk of Magnesia or Miralax) should be taken according to package instructions if there are no bowel movements after 48 hours. °7. Unless discharge instructions indicate otherwise, you may remove your bandages 24-48 hours after surgery, and you may shower at that time.  You may have steri-strips (small skin tapes) in place directly over the incision.  These strips should be left on the skin for 7-10 days.  If your surgeon used skin glue on the incision, you may shower in 24 hours.  The glue will flake off over the next 2-3 weeks.  Any sutures or  staples will be removed at the office during your follow-up visit. °8. ACTIVITIES:  You may resume regular (light) daily activities beginning the next day--such as daily self-care, walking, climbing stairs--gradually increasing activities as tolerated.  You may have sexual intercourse when it is comfortable.  Refrain from any heavy lifting or straining until approved by your doctor. °a. You may drive when you are no longer taking prescription pain medication, you can comfortably wear a seatbelt, and you can safely maneuver your car and apply brakes. °b. RETURN TO WORK:  __________________________________________________________ °9. You should see your doctor in the office for a follow-up appointment approximately 2-3 weeks after your surgery.  Make sure that you call for this appointment within a day or two after you arrive home to insure a convenient appointment time. °10. OTHER INSTRUCTIONS: __________________________________________________________________________________________________________________________ __________________________________________________________________________________________________________________________ °WHEN TO CALL YOUR DOCTOR: °1. Fever over 101.0 °2. Inability to urinate °3. Continued bleeding from incision. °4. Increased pain, redness, or drainage from the incision. °5. Increasing abdominal pain ° °The clinic staff is available to answer your questions during regular business hours.  Please don’t hesitate to call and ask to speak to one of the nurses for clinical concerns.  If you have a medical emergency, go to the nearest emergency room or call 911.  A surgeon from [facility] is always on call at the hospital. °[address], [address], [place], [place]  [address] ? [address] [address], [place], [place]   [address] °[phone] ? [phone] ? FAX [phone] °Web site:   [email or web address] °

## 2017-10-26 NOTE — Anesthesia Procedure Notes (Signed)
Procedure Name: Intubation Date/Time: 10/26/2017 3:33 PM Performed by: White, Cordella Register, CRNA Pre-anesthesia Checklist: Patient identified, Emergency Drugs available, Suction available and Patient being monitored Patient Re-evaluated:Patient Re-evaluated prior to induction Oxygen Delivery Method: Circle System Utilized Preoxygenation: Pre-oxygenation with 100% oxygen Induction Type: IV induction Ventilation: Mask ventilation without difficulty Laryngoscope Size: Miller and 2 Grade View: Grade I Tube type: Oral Tube size: 7.0 mm Number of attempts: 1 Airway Equipment and Method: Stylet and Oral airway Placement Confirmation: ETT inserted through vocal cords under direct vision,  positive ETCO2 and breath sounds checked- equal and bilateral Secured at: 22 cm Tube secured with: Tape Dental Injury: Teeth and Oropharynx as per pre-operative assessment

## 2017-10-26 NOTE — Anesthesia Preprocedure Evaluation (Addendum)
Anesthesia Evaluation  Patient identified by MRN, date of birth, ID band Patient awake    Reviewed: Allergy & Precautions, NPO status , Patient's Chart, lab work & pertinent test results  History of Anesthesia Complications Negative for: history of anesthetic complications  Airway Mallampati: II  TM Distance: >3 FB Neck ROM: Full    Dental  (+) Dental Advisory Given   Pulmonary neg pulmonary ROS,    breath sounds clear to auscultation       Cardiovascular hypertension, Pt. on medications and Pt. on home beta blockers (-) angina Rhythm:Regular Rate:Normal     Neuro/Psych  Headaches, Depression    GI/Hepatic negative GI ROS, Cholelithiasis   Endo/Other  Morbid obesity  Renal/GU negative Renal ROS     Musculoskeletal   Abdominal (+) + obese,   Peds  Hematology negative hematology ROS (+)   Anesthesia Other Findings   Reproductive/Obstetrics OCPs                            Anesthesia Physical Anesthesia Plan  ASA: II  Anesthesia Plan: General   Post-op Pain Management:    Induction: Intravenous  PONV Risk Score and Plan: 4 or greater and Scopolamine patch - Pre-op, Dexamethasone and Ondansetron  Airway Management Planned: Oral ETT  Additional Equipment:   Intra-op Plan:   Post-operative Plan: Extubation in OR  Informed Consent: I have reviewed the patients History and Physical, chart, labs and discussed the procedure including the risks, benefits and alternatives for the proposed anesthesia with the patient or authorized representative who has indicated his/her understanding and acceptance.   Dental advisory given  Plan Discussed with: CRNA and Surgeon  Anesthesia Plan Comments: (Plan routine monitors, GETA)        Anesthesia Quick Evaluation

## 2017-10-26 NOTE — Progress Notes (Signed)
NOTIFIED DR. MILLER OF [facility], PATIENT ASYMPTOMATIC. ORDER GIVEN FOR IV DEXTROSE  1/2  AMP.

## 2017-10-27 ENCOUNTER — Encounter (HOSPITAL_COMMUNITY): Payer: Self-pay | Admitting: Surgery

## 2017-10-31 ENCOUNTER — Emergency Department (HOSPITAL_COMMUNITY): Payer: Medicaid Other

## 2017-10-31 ENCOUNTER — Observation Stay (HOSPITAL_COMMUNITY)
Admission: EM | Admit: 2017-10-31 | Discharge: 2017-11-02 | Disposition: A | Discharge status: Home / Self Care | Payer: Medicaid Other | Attending: Surgery | Admitting: Surgery

## 2017-10-31 ENCOUNTER — Encounter (HOSPITAL_COMMUNITY): Payer: Self-pay

## 2017-10-31 ENCOUNTER — Other Ambulatory Visit: Payer: Self-pay

## 2017-10-31 DIAGNOSIS — E559 Vitamin D deficiency, unspecified: Secondary | ICD-10-CM | POA: Insufficient documentation

## 2017-10-31 DIAGNOSIS — Z79899 Other long term (current) drug therapy: Secondary | ICD-10-CM | POA: Diagnosis not present

## 2017-10-31 DIAGNOSIS — I10 Essential (primary) hypertension: Secondary | ICD-10-CM | POA: Diagnosis not present

## 2017-10-31 DIAGNOSIS — R1010 Upper abdominal pain, unspecified: Secondary | ICD-10-CM | POA: Diagnosis not present

## 2017-10-31 DIAGNOSIS — G8918 Other acute postprocedural pain: Principal | ICD-10-CM | POA: Diagnosis present

## 2017-10-31 DIAGNOSIS — Z79891 Long term (current) use of opiate analgesic: Secondary | ICD-10-CM | POA: Insufficient documentation

## 2017-10-31 DIAGNOSIS — E669 Obesity, unspecified: Secondary | ICD-10-CM | POA: Diagnosis not present

## 2017-10-31 DIAGNOSIS — Z9049 Acquired absence of other specified parts of digestive tract: Secondary | ICD-10-CM | POA: Diagnosis not present

## 2017-10-31 DIAGNOSIS — G43009 Migraine without aura, not intractable, without status migrainosus: Secondary | ICD-10-CM | POA: Insufficient documentation

## 2017-10-31 DIAGNOSIS — F329 Major depressive disorder, single episode, unspecified: Secondary | ICD-10-CM | POA: Insufficient documentation

## 2017-10-31 DIAGNOSIS — Z6833 Body mass index (BMI) 33.0-33.9, adult: Secondary | ICD-10-CM | POA: Insufficient documentation

## 2017-10-31 DIAGNOSIS — R945 Abnormal results of liver function studies: Secondary | ICD-10-CM

## 2017-10-31 DIAGNOSIS — R7989 Other specified abnormal findings of blood chemistry: Secondary | ICD-10-CM | POA: Insufficient documentation

## 2017-10-31 DIAGNOSIS — D72829 Elevated white blood cell count, unspecified: Secondary | ICD-10-CM | POA: Diagnosis not present

## 2017-10-31 DIAGNOSIS — R109 Unspecified abdominal pain: Secondary | ICD-10-CM

## 2017-10-31 DIAGNOSIS — I4510 Unspecified right bundle-branch block: Secondary | ICD-10-CM | POA: Diagnosis not present

## 2017-10-31 LAB — CBC WITH DIFFERENTIAL/PLATELET
BASOS PCT: 0 %
Basophils Absolute: 0 10*3/uL (ref 0.0–0.1)
Eosinophils Absolute: 0.3 10*3/uL (ref 0.0–0.7)
Eosinophils Relative: 2 %
HCT: 43.4 % (ref 36.0–46.0)
Hemoglobin: 14.1 g/dL (ref 12.0–15.0)
Lymphocytes Relative: 10 %
Lymphs Abs: 2 10*3/uL (ref 0.7–4.0)
MCH: 26.8 pg (ref 26.0–34.0)
MCHC: 32.5 g/dL (ref 30.0–36.0)
MCV: 82.5 fL (ref 78.0–100.0)
MONO ABS: 1 10*3/uL (ref 0.1–1.0)
MONOS PCT: 5 %
NEUTROS ABS: 16.4 10*3/uL — AB (ref 1.7–7.7)
Neutrophils Relative %: 83 %
Platelets: 270 10*3/uL (ref 150–400)
RBC: 5.26 MIL/uL — ABNORMAL HIGH (ref 3.87–5.11)
RDW: 13.5 % (ref 11.5–15.5)
WBC: 19.7 10*3/uL — ABNORMAL HIGH (ref 4.0–10.5)

## 2017-10-31 LAB — I-STAT BETA HCG BLOOD, ED (MC, WL, AP ONLY)

## 2017-10-31 LAB — BASIC METABOLIC PANEL
ANION GAP: 11 (ref 5–15)
BUN: 13 mg/dL (ref 6–20)
CALCIUM: 9.5 mg/dL (ref 8.9–10.3)
CO2: 25 mmol/L (ref 22–32)
Chloride: 106 mmol/L (ref 101–111)
Creatinine, Ser: 0.77 mg/dL (ref 0.44–1.00)
GLUCOSE: 161 mg/dL — AB (ref 65–99)
POTASSIUM: 4.1 mmol/L (ref 3.5–5.1)
Sodium: 142 mmol/L (ref 135–145)

## 2017-10-31 LAB — I-STAT TROPONIN, ED: Troponin i, poc: 0.01 ng/mL (ref 0.00–0.08)

## 2017-10-31 LAB — D-DIMER, QUANTITATIVE (NOT AT ARMC): D DIMER QUANT: 1.17 ug{FEU}/mL — AB (ref 0.00–0.50)

## 2017-10-31 LAB — LIPASE, BLOOD: LIPASE: 33 U/L (ref 11–51)

## 2017-10-31 LAB — HEPATIC FUNCTION PANEL
ALBUMIN: 3.8 g/dL (ref 3.5–5.0)
ALK PHOS: 105 U/L (ref 38–126)
ALT: 79 U/L — ABNORMAL HIGH (ref 14–54)
AST: 144 U/L — ABNORMAL HIGH (ref 15–41)
BILIRUBIN INDIRECT: 1 mg/dL — AB (ref 0.3–0.9)
BILIRUBIN TOTAL: 1.5 mg/dL — AB (ref 0.3–1.2)
Bilirubin, Direct: 0.5 mg/dL (ref 0.1–0.5)
TOTAL PROTEIN: 7.3 g/dL (ref 6.5–8.1)

## 2017-10-31 MED ORDER — SODIUM CHLORIDE 0.9 % IV BOLUS (SEPSIS)
1000.0000 mL | Freq: Once | INTRAVENOUS | Status: AC
  Administered 2017-10-31: 1000 mL via INTRAVENOUS

## 2017-10-31 MED ORDER — SODIUM CHLORIDE 0.9 % IV SOLN
1000.0000 mL | INTRAVENOUS | Status: DC
  Administered 2017-10-31 – 2017-11-01 (×4): 1000 mL via INTRAVENOUS

## 2017-10-31 MED ORDER — IOPAMIDOL (ISOVUE-370) INJECTION 76%
100.0000 mL | Freq: Once | INTRAVENOUS | Status: AC | PRN
  Administered 2017-10-31: 100 mL via INTRAVENOUS

## 2017-10-31 MED ORDER — IOPAMIDOL (ISOVUE-370) INJECTION 76%
INTRAVENOUS | Status: AC
  Filled 2017-10-31: qty 100

## 2017-10-31 NOTE — ED Notes (Signed)
Pt got up and ambulated to the bathroom, upon return RN found her very SOB.  She was monitored and O2 stats were stable once hooked back up to the monitor.

## 2017-10-31 NOTE — ED Notes (Signed)
Pt had near-syncopal episode while sitting in triage room; family member came to nurses station to inform the patient didn't look right and said she stated she felt like she was going to faint; pt appeared pale and lips were purple, sweat noted to her forehead; VS assessed which were normal; pt put into reclining chair and given cool wash cloth

## 2017-10-31 NOTE — ED Notes (Signed)
Pt went to XR and IV team now bedside

## 2017-10-31 NOTE — H&P (Signed)
Lauren Cardenas is an 32 y.o. female.   Chief Complaint: Upper abdominal pain HPI: This is a 32 year old female who is status post laparoscopic cholecystectomy on 10/26/2017 by Dr. Brantley Stage.  He was unable to perform a cholangiogram during surgery because of the small size of the cystic duct.  The patient had been recovering nicely after her surgery and was having minimal pain.  She had a bowl of cereal earlier today and subsequently developed severe upper abdominal pain.  She felt like someone was sitting on her upper abdomen/lower chest.  She had difficulty catching a full breath.  She states that this feels like her previous attacks prior to her cholecystectomy.  She had some nausea and attempted to vomit.  No diarrhea.  No fever.  She was evaluated in the emergency department.  Pulmonary embolus was ruled out by CT angio.  Her liver function tests are mildly abnormal.  She does have an elevated white blood cell count.  Past Medical History:  Diagnosis Date  . Back pain   . Chronic migraine   . Constipation   . Dysuria   . GERD (gastroesophageal reflux disease)   . History of acute pyelonephritis    09-27-2015  . History of kidney stones   . HTN (hypertension)   . Interstitial cystitis   . Neck pain   . OAB (overactive bladder)   . Obesity   . Pneumonia   . Pre-diabetes   . Right ureteral stone   . Shoulder pain   . Urgency of urination   . Vesicoureteral reflux     Past Surgical History:  Procedure Laterality Date  . CHOLECYSTECTOMY N/A 10/26/2017   Procedure: LAPAROSCOPIC CHOLECYSTECTOMY WITH INTRAOPERATIVE CHOLANGIOGRAM ERAS PATHWAY;  Surgeon: Erroll Luna, MD;  Location: [facility];  Service: General;  Laterality: N/A;  . CYSTOSCOPY W/ URETERAL STENT PLACEMENT Right 09/27/2015   Procedure: CYSTOSCOPY WITH RETROGRADE PYELOGRAM/URETERAL STENT PLACEMENT;  Surgeon: Nickie Retort, MD;  Location: [facility];  Service: Urology;  Laterality: Right;  . CYSTOSCOPY WITH RETROGRADE PYELOGRAM,  URETEROSCOPY AND STENT PLACEMENT Left 09/09/2014   Procedure: CYSTOSCOPY WITH LEFT RETROGRADE PYELOGRAM, URETEROSCOPY AND STONE EXTRACTION  DOUBLE J STENT PLACEMENT;  Surgeon: Festus Aloe, MD;  Location: [facility];  Service: Urology;  Laterality: Left;  . CYSTOSCOPY/URETEROSCOPY/HOLMIUM LASER/STENT PLACEMENT Right 10/16/2015   Procedure: RIGHT URETEROSCOPY/HOLMIUM LASER/STENT PLACEMENT;  Surgeon: Festus Aloe, MD;  Location: [facility];  Service: Urology;  Laterality: Right;  . HOLMIUM LASER APPLICATION Left 1/61/0960   Procedure: HOLMIUM LASER APPLICATION;  Surgeon: Festus Aloe, MD;  Location: [facility];  Service: Urology;  Laterality: Left;  . HOLMIUM LASER APPLICATION Right 4/54/0981   Procedure: HOLMIUM LASER APPLICATION;  Surgeon: Festus Aloe, MD;  Location: [facility];  Service: Urology;  Laterality: Right;  . STONE EXTRACTION WITH BASKET Right 10/16/2015   Procedure: STONE EXTRACTION WITH BASKET;  Surgeon: Festus Aloe, MD;  Location: [facility];  Service: Urology;  Laterality: Right;  . WISDOM TOOTH EXTRACTION  03/2014    Family History  Problem Relation Age of Onset  . Hypertension Mother   . Obesity Mother   . Diabetes Father   . Kidney disease Father   . Sudden death Father    Social History:  reports that she has never smoked. She has never used smokeless tobacco. She reports that she does not drink alcohol or use drugs.  Allergies:  Allergies  Allergen Reactions  . Sudafed [Pseudoephedrine Hcl] Shortness Of Breath  .  Amoxicillin Hives    Has patient had a PCN reaction causing immediate rash, facial/tongue/throat swelling, SOB or lightheadedness with hypotension: Yes Has patient had a PCN reaction causing severe rash involving mucus membranes or skin necrosis: No Has patient had a PCN reaction that required hospitalization: No Has patient had a PCN reaction occurring within the last 10 years: Yes If all of the  above answers are "NO", then may proceed with Cephalosporin use.     Prior to Admission medications   Medication Sig Start Date End Date Taking? Authorizing Provider  buPROPion (WELLBUTRIN SR) 150 MG 12 hr tablet Take 1 tablet (150 mg total) by mouth daily. 08/28/17  Yes Alene Mires, Sahar M, PA-C  ibuprofen (ADVIL,MOTRIN) 800 MG tablet Take 1 tablet (800 mg total) by mouth every 8 (eight) hours as needed. 10/26/17  Yes Cornett, Marcello Moores, MD  oxyCODONE (OXY IR/ROXICODONE) 5 MG immediate release tablet Take 1 tablet (5 mg total) by mouth every 6 (six) hours as needed for severe pain. 10/26/17  Yes Cornett, Marcello Moores, MD  promethazine (PHENERGAN) 25 MG tablet Take 1 tablet (25 mg total) as needed by mouth for nausea or vomiting. 05/08/17  Yes Melvenia Beam, MD  propranolol ER (INDERAL LA) 120 MG 24 hr capsule Take 1 capsule (120 mg total) at bedtime by mouth. 05/09/17  Yes Melvenia Beam, MD  Vitamin D, Ergocalciferol, (DRISDOL) 50000 units CAPS capsule Take 1 capsule (50,000 Units total) by mouth every 14 (fourteen) days. 10/09/17  Yes Alene Mires, Sahar M, PA-C  cyclobenzaprine (FLEXERIL) 5 MG tablet Take 1 tablet (5 mg total) by mouth 3 (three) times daily as needed for muscle spasms. Patient not taking: Reported on 10/23/2017 08/04/17   Eustaquio Maize, MD  EQ ALLERGY RELIEF, CETIRIZINE, 10 MG tablet TAKE 1 TABLET BY MOUTH ONCE DAILY Patient not taking: Reported on 10/23/2017 09/18/17   Timmothy Euler, MD  fluticasone [facility]) 50 MCG/ACT nasal spray Place 2 sprays as needed into both nostrils for allergies or rhinitis. Patient not taking: Reported on 10/23/2017 05/10/17   Janora Norlander, DO  metFORMIN (GLUCOPHAGE) 500 MG tablet Take 1 tablet (500 mg total) by mouth daily with breakfast. Patient not taking: Reported on 10/31/2017 08/28/17   Waldon Merl, PA-C  rizatriptan (MAXALT-MLT) 10 MG disintegrating tablet Take 1 tablet (10 mg total) 3 (three) times daily as needed by mouth for migraine. 05/09/17   Melvenia Beam, MD     Results for orders placed or performed during the hospital encounter of 10/31/17 (from the past 48 hour(s))  CBC with Differential     Status: Abnormal   Collection Time: 10/31/17  5:58 PM  Result Value Ref Range   WBC 19.7 (H) 4.0 - 10.5 K/uL   RBC 5.26 (H) 3.87 - 5.11 MIL/uL   Hemoglobin 14.1 12.0 - 15.0 g/dL   HCT 43.4 36.0 - 46.0 %   MCV 82.5 78.0 - 100.0 fL   MCH 26.8 26.0 - 34.0 pg   MCHC 32.5 30.0 - 36.0 g/dL   RDW 13.5 11.5 - 15.5 %   Platelets 270 150 - 400 K/uL   Neutrophils Relative % 83 %   Neutro Abs 16.4 (H) 1.7 - 7.7 K/uL   Lymphocytes Relative 10 %   Lymphs Abs 2.0 0.7 - 4.0 K/uL   Monocytes Relative 5 %   Monocytes Absolute 1.0 0.1 - 1.0 K/uL   Eosinophils Relative 2 %   Eosinophils Absolute 0.3 0.0 - 0.7 K/uL   Basophils  Relative 0 %   Basophils Absolute 0.0 0.0 - 0.1 K/uL    Comment: Performed at [facility], [place] [address], [place], [place] [address]  Basic metabolic panel     Status: Abnormal   Collection Time: 10/31/17  5:58 PM  Result Value Ref Range   Sodium 142 135 - 145 mmol/L   Potassium 4.1 3.5 - 5.1 mmol/L    Comment: SLIGHT HEMOLYSIS   Chloride 106 101 - 111 mmol/L   CO2 25 22 - 32 mmol/L   Glucose, Bld 161 (H) 65 - 99 mg/dL   BUN 13 6 - 20 mg/dL   Creatinine, Ser 0.77 0.44 - 1.00 mg/dL   Calcium 9.5 8.9 - 10.3 mg/dL   GFR calc non Af Amer >60 >60 mL/min   GFR calc Af Amer >60 >60 mL/min    Comment: (NOTE) The eGFR has been calculated using the CKD EPI equation. This calculation has not been validated in all clinical situations. eGFR's persistently <60 mL/min signify possible Chronic Kidney Disease.    Anion gap 11 5 - 15    Comment: Performed at [place] [address], [place], [place] [address]  D-dimer, quantitative (not at [facility])     Status: Abnormal   Collection Time: 10/31/17  5:58 PM  Result Value Ref Range   D-Dimer, Quant 1.17 (H) 0.00 - 0.50 ug/mL-FEU    Comment: (NOTE) At the  manufacturer cut-off of 0.50 ug/mL FEU, this assay has been documented to exclude PE with a sensitivity and negative predictive value of 97 to 99%.  At this time, this assay has not been approved by the FDA to exclude DVT/VTE. Results should be correlated with clinical presentation. Performed at [facility], [place] [address] [place], [place] [address]   I-Stat Troponin, ED (not at [facility])     Status: None   Collection Time: 10/31/17  6:03 PM  Result Value Ref Range   Troponin i, poc 0.01 0.00 - 0.08 ng/mL   Comment 3            Comment: Due to the release kinetics of cTnI, a negative result within the first hours of the onset of symptoms does not rule out myocardial infarction with certainty. If myocardial infarction is still suspected, repeat the test at appropriate intervals.   I-Stat Beta hCG blood, ED ([facility], [facility], [facility] only)     Status: None   Collection Time: 10/31/17  7:07 PM  Result Value Ref Range   I-stat hCG, quantitative <5.0 <5 mIU/mL   Comment 3            Comment:   GEST. AGE      CONC.  (mIU/mL)   <=1 WEEK        5 - 50     2 WEEKS       50 - 500     3 WEEKS       100 - 10,000     4 WEEKS     1,000 - 30,000        FEMALE AND NON-PREGNANT FEMALE:     LESS THAN 5 mIU/mL   Hepatic function panel     Status: Abnormal   Collection Time: 10/31/17  7:12 PM  Result Value Ref Range   Total Protein 7.3 6.5 - 8.1 g/dL   Albumin 3.8 3.5 - 5.0 g/dL   AST 144 (H) 15 - 41 U/L   ALT 79 (H) 14 - 54  U/L   Alkaline Phosphatase 105 38 - 126 U/L   Total Bilirubin 1.5 (H) 0.3 - 1.2 mg/dL   Bilirubin, Direct 0.5 0.1 - 0.5 mg/dL   Indirect Bilirubin 1.0 (H) 0.3 - 0.9 mg/dL    Comment: Performed at [facility] [place] [address], [place], [place] [address]  Lipase, blood     Status: None   Collection Time: 10/31/17  7:12 PM  Result Value Ref Range   Lipase 33 11 - 51 U/L    Comment: Performed at [facility] [address], [place],  [address]   Dg Chest 2  View  Result Date: 10/31/2017 CLINICAL DATA:  32 year old female with chest pain radiating to the back since 1530 hours today. Laparoscopic cholecystectomy postoperative day 5. EXAM: CHEST - 2 VIEW COMPARISON:  CT Abdomen and Pelvis 10/07/2017. Chest radiographs 10/06/2017. FINDINGS: Low lung volumes. Mediastinal contours remain normal. Visualized tracheal air column is within normal limits. No pneumothorax or pneumoperitoneum. No pulmonary edema, pleural effusion or confluent pulmonary opacity. Cholecystectomy clips in the right upper quadrant. Visible bowel gas pattern is within normal limits. No osseous abnormality identified. IMPRESSION: 1. Low lung volumes, otherwise no acute cardiopulmonary abnormality. 2. Cholecystectomy clips in the right upper quadrant. Visible bowel gas pattern within normal limits. Electronically Signed   By: Genevie Ann M.D.   On: 10/31/2017 20:17   Ct Angio Chest Pe W And/or Wo Contrast  Result Date: 10/31/2017 CLINICAL DATA:  Shortness of breath with flank pain and back pain. EXAM: CT ANGIOGRAPHY CHEST WITH CONTRAST TECHNIQUE: Multidetector CT imaging of the chest was performed using the standard protocol during bolus administration of intravenous contrast. Multiplanar CT image reconstructions and MIPs were obtained to evaluate the vascular anatomy. CONTRAST:  152m ISOVUE-370 IOPAMIDOL (ISOVUE-370) INJECTION 76% COMPARISON:  Chest radiograph 10/31/2017 FINDINGS: Cardiovascular: Contrast injection is sufficient to demonstrate satisfactory opacification of the pulmonary arteries to the segmental level. There is no pulmonary embolus. The main pulmonary artery is within normal limits for size. There is a normal 3-vessel arch branching pattern without evidence of acute aortic syndrome. There is noaortic atherosclerosis. Heart size is normal, without pericardial effusion. Mediastinum/Nodes: No mediastinal, hilar or axillary lymphadenopathy. The visualized thyroid and thoracic esophageal  course are unremarkable. Lungs/Pleura: No pulmonary nodules or masses. No pleural effusion or pneumothorax. No focal airspace consolidation. No focal pleural abnormality. Upper Abdomen: Contrast bolus timing is not optimized for evaluation of the abdominal organs. Within this limitation, the visualized organs of the upper abdomen are normal. Musculoskeletal: No chest wall abnormality. No acute or significant osseous findings. Review of the MIP images confirms the above findings. IMPRESSION: No pulmonary embolus or other acute thoracic abnormality. Electronically Signed   By: KUlyses JarredM.D.   On: 10/31/2017 22:03    Review of Systems  Constitutional: Negative for weight loss.  HENT: Negative for ear discharge, ear pain, hearing loss and tinnitus.   Eyes: Negative for blurred vision, double vision, photophobia and pain.  Respiratory: Positive for shortness of breath. Negative for cough and sputum production.   Cardiovascular: Positive for chest pain.  Gastrointestinal: Positive for abdominal pain and nausea. Negative for vomiting.  Genitourinary: Negative for dysuria, flank pain, frequency and urgency.  Musculoskeletal: Negative for back pain, falls, joint pain, myalgias and neck pain.  Neurological: Negative for dizziness, tingling, sensory change, focal weakness, loss of consciousness and headaches.  Endo/Heme/Allergies: Does not bruise/bleed easily.  Psychiatric/Behavioral: Negative for depression, memory loss and substance abuse. The patient  is not nervous/anxious.     Blood pressure 131/86, pulse 86, temperature 99.2 F (37.3 C), temperature source Oral, resp. rate (!) 23, last menstrual period 10/01/2017, SpO2 99 %. Physical Exam  WDWN in NAD Eyes:  Pupils equal, round; sclera anicteric HENT:  Oral mucosa moist; good dentition  Neck:  No masses palpated, no thyromegaly Lungs:  CTA bilaterally; normal respiratory effort CV:  Regular rate and rhythm; no murmurs; extremities  well-perfused with no edema Abd:  +bowel sounds, soft, mildly tender in upper abd; incisions well-healed; no hernias; Skin:  Warm, dry; no sign of jaundice Psychiatric - alert and oriented x 4; calm mood and affect  Assessment/Plan Acute upper abdominal pain s/p lap chole Mildly elevated LFT's, elevated WBC  Admit to Dr. Brantley Stage HIDA scan to rule out leak vs. CBD stone Pain/ nausea control  Maia Petties, MD 10/31/2017, 11:24 PM

## 2017-10-31 NOTE — ED Notes (Signed)
Patient transported to CT 

## 2017-10-31 NOTE — ED Triage Notes (Signed)
Pt presents with shortness of breath that began this afternoon.  Pt is s/o gallbladder removed on 5/2. Pt denies any cough; reports pain around bra-line.

## 2017-10-31 NOTE — ED Provider Notes (Signed)
[facility] EMERGENCY DEPARTMENT Provider Note   CSN: [id number] Arrival date & time: 10/31/17  1734     History   Chief Complaint SOB  HPI Lauren Cardenas is a 32 y.o. female.  HPI Pt is s/p cholecystectomy on 10/26/17.  Pt started having trouble with shortness of breath today in the afternoon suddenly.  She felt  A pressure and tightgness in her upper abdomen and chest.  Sx were similar to when she first had her gallbladder attack. While in the ED she felt as if she was going pass out and became diaphoretic.  No cough.  No leg swelling.  No fevers.    Past Medical History:  Diagnosis Date  . Back pain   . Chronic migraine   . Constipation   . Dysuria   . GERD (gastroesophageal reflux disease)   . History of acute pyelonephritis    09-27-2015  . History of kidney stones   . HTN (hypertension)   . Interstitial cystitis   . Neck pain   . OAB (overactive bladder)   . Obesity   . Pneumonia   . Pre-diabetes   . Right ureteral stone   . Shoulder pain   . Urgency of urination   . Vesicoureteral reflux     Patient Active Problem List   Diagnosis Date Noted  . Postoperative upper abdominal pain 10/31/2017  . Prediabetes 05/30/2017  . Vitamin D deficiency 05/30/2017  . Other hyperlipidemia 05/30/2017  . Depression 05/30/2017  . Class 1 obesity with serious comorbidity and body mass index (BMI) of 31.0 to 31.9 in adult 05/30/2017  . Migraine without aura and without status migrainosus, not intractable 05/09/2017  . Other fatigue 03/13/2017  . Hyperglycemia 03/13/2017  . Class 1 obesity with serious comorbidity and body mass index (BMI) of 33.0 to 33.9 in adult 03/13/2017  . Syncope and collapse 09/27/2015  . Nephrolithiasis 09/27/2015  . Syncope 03/20/2012    Past Surgical History:  Procedure Laterality Date  . CHOLECYSTECTOMY N/A 10/26/2017   Procedure: LAPAROSCOPIC CHOLECYSTECTOMY WITH INTRAOPERATIVE CHOLANGIOGRAM ERAS PATHWAY;  Surgeon: Harriette Bouillon, MD;  Location: [facility] OR;  Service: General;  Laterality: N/A;  . CYSTOSCOPY W/ URETERAL STENT PLACEMENT Right 09/27/2015   Procedure: CYSTOSCOPY WITH RETROGRADE PYELOGRAM/URETERAL STENT PLACEMENT;  Surgeon: Hildred Laser, MD;  Location: [facility];  Service: Urology;  Laterality: Right;  . CYSTOSCOPY WITH RETROGRADE PYELOGRAM, URETEROSCOPY AND STENT PLACEMENT Left 09/09/2014   Procedure: CYSTOSCOPY WITH LEFT RETROGRADE PYELOGRAM, URETEROSCOPY AND STONE EXTRACTION  DOUBLE J STENT PLACEMENT;  Surgeon: Jerilee Field, MD;  Location: [facility];  Service: Urology;  Laterality: Left;  . CYSTOSCOPY/URETEROSCOPY/HOLMIUM LASER/STENT PLACEMENT Right 10/16/2015   Procedure: RIGHT URETEROSCOPY/HOLMIUM LASER/STENT PLACEMENT;  Surgeon: Jerilee Field, MD;  Location: [facility];  Service: Urology;  Laterality: Right;  . HOLMIUM LASER APPLICATION Left 09/09/2014   Procedure: HOLMIUM LASER APPLICATION;  Surgeon: Jerilee Field, MD;  Location: [facility];  Service: Urology;  Laterality: Left;  . HOLMIUM LASER APPLICATION Right 10/16/2015   Procedure: HOLMIUM LASER APPLICATION;  Surgeon: Jerilee Field, MD;  Location: [facility];  Service: Urology;  Laterality: Right;  . STONE EXTRACTION WITH BASKET Right 10/16/2015   Procedure: STONE EXTRACTION WITH BASKET;  Surgeon: Jerilee Field, MD;  Location: [facility];  Service: Urology;  Laterality: Right;  . WISDOM TOOTH EXTRACTION  03/2014     OB History    Gravida  1   Para  1   Term  1   Preterm      AB      Living  1     SAB      TAB      Ectopic      Multiple      Live Births  1            Home Medications    Prior to Admission medications   Medication Sig Start Date End Date Taking? Authorizing Provider  buPROPion (WELLBUTRIN SR) 150 MG 12 hr tablet Take 1 tablet (150 mg total) by mouth daily. 08/28/17  Yes Camila Li, Sahar M, PA-C  ibuprofen (ADVIL,MOTRIN) 800 MG tablet Take 1 tablet (800 mg  total) by mouth every 8 (eight) hours as needed. 10/26/17  Yes Cornett, Maisie Fus, MD  oxyCODONE (OXY IR/ROXICODONE) 5 MG immediate release tablet Take 1 tablet (5 mg total) by mouth every 6 (six) hours as needed for severe pain. 10/26/17  Yes Cornett, Maisie Fus, MD  promethazine (PHENERGAN) 25 MG tablet Take 1 tablet (25 mg total) as needed by mouth for nausea or vomiting. 05/08/17  Yes Anson Fret, MD  propranolol ER (INDERAL LA) 120 MG 24 hr capsule Take 1 capsule (120 mg total) at bedtime by mouth. 05/09/17  Yes Anson Fret, MD  Vitamin D, Ergocalciferol, (DRISDOL) 50000 units CAPS capsule Take 1 capsule (50,000 Units total) by mouth every 14 (fourteen) days. 10/09/17  Yes Camila Li, Sahar M, PA-C  cyclobenzaprine (FLEXERIL) 5 MG tablet Take 1 tablet (5 mg total) by mouth 3 (three) times daily as needed for muscle spasms. Patient not taking: Reported on 10/23/2017 08/04/17   Johna Sheriff, MD  EQ ALLERGY RELIEF, CETIRIZINE, 10 MG tablet TAKE 1 TABLET BY MOUTH ONCE DAILY Patient not taking: Reported on 10/23/2017 09/18/17   Elenora Gamma, MD  fluticasone [facility]) 50 MCG/ACT nasal spray Place 2 sprays as needed into both nostrils for allergies or rhinitis. Patient not taking: Reported on 10/23/2017 05/10/17   Raliegh Ip, DO  metFORMIN (GLUCOPHAGE) 500 MG tablet Take 1 tablet (500 mg total) by mouth daily with breakfast. Patient not taking: Reported on 10/31/2017 08/28/17   Demetrio Lapping, PA-C  rizatriptan (MAXALT-MLT) 10 MG disintegrating tablet Take 1 tablet (10 mg total) 3 (three) times daily as needed by mouth for migraine. 05/09/17   Anson Fret, MD    Family History Family History  Problem Relation Age of Onset  . Hypertension Mother   . Obesity Mother   . Diabetes Father   . Kidney disease Father   . Sudden death Father     Social History Social History   Tobacco Use  . Smoking status: Never Smoker  . Smokeless tobacco: Never Used  Substance Use Topics  . Alcohol  use: No  . Drug use: No     Allergies   Sudafed [pseudoephedrine hcl] and Amoxicillin   Review of Systems Review of Systems  All other systems reviewed and are negative.    Physical Exam Updated Vital Signs BP 131/86   Pulse 86   Temp 99.2 F (37.3 C) (Oral)   Resp (!) 23   LMP 10/01/2017   SpO2 99%   Physical Exam  Constitutional: She appears well-developed and well-nourished. No distress.  HENT:  Head: Normocephalic and atraumatic.  Right Ear: External ear normal.  Left Ear: External ear normal.  Eyes: Conjunctivae are normal. Right eye exhibits no discharge. Left eye exhibits no discharge. No scleral icterus.  Neck: Neck supple. No tracheal  deviation present.  Cardiovascular: Normal rate, regular rhythm and intact distal pulses.  Pulmonary/Chest: Effort normal and breath sounds normal. No stridor. No respiratory distress. She has no wheezes. She has no rales.  Abdominal: Soft. Bowel sounds are normal. She exhibits no distension. There is tenderness in the epigastric area. There is no rebound and no guarding.  Musculoskeletal: She exhibits no edema or tenderness.  Neurological: She is alert. She has normal strength. No cranial nerve deficit (no facial droop, extraocular movements intact, no slurred speech) or sensory deficit. She exhibits normal muscle tone. She displays no seizure activity. Coordination normal.  Skin: Skin is warm and dry. No rash noted.  Psychiatric: She has a normal mood and affect.  Nursing note and vitals reviewed.    ED Treatments / Results  Labs (all labs ordered are listed, but only abnormal results are displayed) Labs Reviewed  CBC WITH DIFFERENTIAL/PLATELET - Abnormal; Notable for the following components:      Result Value   WBC 19.7 (*)    RBC 5.26 (*)    Neutro Abs 16.4 (*)    All other components within normal limits  BASIC METABOLIC PANEL - Abnormal; Notable for the following components:   Glucose, Bld 161 (*)    All other  components within normal limits  D-DIMER, QUANTITATIVE (NOT AT [facility]) - Abnormal; Notable for the following components:   D-Dimer, Quant 1.17 (*)    All other components within normal limits  HEPATIC FUNCTION PANEL - Abnormal; Notable for the following components:   AST 144 (*)    ALT 79 (*)    Total Bilirubin 1.5 (*)    Indirect Bilirubin 1.0 (*)    All other components within normal limits  LIPASE, BLOOD  I-STAT TROPONIN, ED  I-STAT BETA HCG BLOOD, ED ([facility], [facility], [facility] ONLY)    EKG EKG Interpretation  Date/Time:  Tuesday Oct 31 2017 17:47:43 EDT Ventricular Rate:  102 PR Interval:  132 QRS Duration: 92 QT Interval:  348 QTC Calculation: 453 R Axis:   -36 Text Interpretation:  Sinus tachycardia Left axis deviation Incomplete right bundle branch block Possible Anterolateral infarct , age undetermined Abnormal ECG No significant change since last tracing Confirmed by Linwood Dibbles [id number]) on 10/31/2017 6:32:01 PM   Radiology Dg Chest 2 View  Result Date: 10/31/2017 CLINICAL DATA:  32 year old female with chest pain radiating to the back since 1530 hours today. Laparoscopic cholecystectomy postoperative day 5. EXAM: CHEST - 2 VIEW COMPARISON:  CT Abdomen and Pelvis 10/07/2017. Chest radiographs 10/06/2017. FINDINGS: Low lung volumes. Mediastinal contours remain normal. Visualized tracheal air column is within normal limits. No pneumothorax or pneumoperitoneum. No pulmonary edema, pleural effusion or confluent pulmonary opacity. Cholecystectomy clips in the right upper quadrant. Visible bowel gas pattern is within normal limits. No osseous abnormality identified. IMPRESSION: 1. Low lung volumes, otherwise no acute cardiopulmonary abnormality. 2. Cholecystectomy clips in the right upper quadrant. Visible bowel gas pattern within normal limits. Electronically Signed   By: Odessa Fleming M.D.   On: 10/31/2017 20:17   Ct Angio Chest Pe W And/or Wo Contrast  Result Date: 10/31/2017 CLINICAL DATA:  Shortness of  breath with flank pain and back pain. EXAM: CT ANGIOGRAPHY CHEST WITH CONTRAST TECHNIQUE: Multidetector CT imaging of the chest was performed using the standard protocol during bolus administration of intravenous contrast. Multiplanar CT image reconstructions and MIPs were obtained to evaluate the vascular anatomy. CONTRAST:  ISOVUE-370 IOPAMIDOL (ISOVUE-370) INJECTION 76% COMPARISON:  Chest radiograph 10/31/2017 FINDINGS: Cardiovascular:  Contrast injection is sufficient to demonstrate satisfactory opacification of the pulmonary arteries to the segmental level. There is no pulmonary embolus. The main pulmonary artery is within normal limits for size. There is a normal 3-vessel arch branching pattern without evidence of acute aortic syndrome. There is noaortic atherosclerosis. Heart size is normal, without pericardial effusion. Mediastinum/Nodes: No mediastinal, hilar or axillary lymphadenopathy. The visualized thyroid and thoracic esophageal course are unremarkable. Lungs/Pleura: No pulmonary nodules or masses. No pleural effusion or pneumothorax. No focal airspace consolidation. No focal pleural abnormality. Upper Abdomen: Contrast bolus timing is not optimized for evaluation of the abdominal organs. Within this limitation, the visualized organs of the upper abdomen are normal. Musculoskeletal: No chest wall abnormality. No acute or significant osseous findings. Review of the MIP images confirms the above findings. IMPRESSION: No pulmonary embolus or other acute thoracic abnormality. Electronically Signed   By: Deatra Robinson M.D.   On: 10/31/2017 22:03    Procedures Procedures (including critical care time)  Medications Ordered in ED Medications  sodium chloride 0.9 % bolus 1,000 mL (1,000 mLs Intravenous New Bag/Given 10/31/17 2010)    Followed by  0.9 %  sodium chloride infusion (1,000 mLs Intravenous New Bag/Given 10/31/17 2202)  iopamidol (ISOVUE-370) 76 % injection (has no administration in time  range)  iopamidol (ISOVUE-370) 76 % injection 100 mL (100 mLs Intravenous Contrast Given 10/31/17 2134)     Initial Impression / Assessment and Plan / ED Course  I have reviewed the triage vital signs and the nursing notes.  Pertinent labs & imaging results that were available during my care of the patient were reviewed by me and considered in my medical decision making (see chart for details).  Clinical Course as of Nov 01 2327  Tue Oct 31, 2017  2029 D dimer is elevated.  Will proceed with CT scan   [JK]  2232 Pt continues to feel short of breath.  CT is negative for acute findings.  WBC is elevated as well as bilir and lfts.  New since surgery   [JK]  2313 D/w Dr Corliss Skains.  Sx concerning for possible bile leak. Recommends CT scan of abd and pelvis to evaluate.   [JK]    Clinical Course User Index [JK] Linwood Dibbles, MD    Pt presented for acute shortness of breath following recent surgery.   CT scan is negative for acute PE.  Labs notable for increased wbc and increasing lfts.  Sx may be related to complications from her surgery.  D/w Dr Corliss Skains.  Pt will be admitted to the hospital.  Possible hida scan in the am.  Final Clinical Impressions(s) / ED Diagnoses   Final diagnoses:  Leukocytosis, unspecified type  Hyperbilirubinemia  Postoperative abdominal pain      Linwood Dibbles, MD 10/31/17 2330

## 2017-11-01 ENCOUNTER — Observation Stay (HOSPITAL_COMMUNITY): Payer: Medicaid Other

## 2017-11-01 ENCOUNTER — Encounter (HOSPITAL_COMMUNITY): Payer: Self-pay | Admitting: General Practice

## 2017-11-01 ENCOUNTER — Other Ambulatory Visit: Payer: Self-pay

## 2017-11-01 LAB — CBC
HCT: 37.5 % (ref 36.0–46.0)
HCT: 39.8 % (ref 36.0–46.0)
Hemoglobin: 12.1 g/dL (ref 12.0–15.0)
Hemoglobin: 13 g/dL (ref 12.0–15.0)
MCH: 26.5 pg (ref 26.0–34.0)
MCH: 27.2 pg (ref 26.0–34.0)
MCHC: 32.3 g/dL (ref 30.0–36.0)
MCHC: 32.7 g/dL (ref 30.0–36.0)
MCV: 82.2 fL (ref 78.0–100.0)
MCV: 83.3 fL (ref 78.0–100.0)
PLATELETS: 230 10*3/uL (ref 150–400)
Platelets: 223 10*3/uL (ref 150–400)
RBC: 4.56 MIL/uL (ref 3.87–5.11)
RBC: 4.78 MIL/uL (ref 3.87–5.11)
RDW: 13.6 % (ref 11.5–15.5)
RDW: 13.9 % (ref 11.5–15.5)
WBC: 12.8 10*3/uL — AB (ref 4.0–10.5)
WBC: 8.5 10*3/uL (ref 4.0–10.5)

## 2017-11-01 LAB — COMPREHENSIVE METABOLIC PANEL
ALT: 240 U/L — AB (ref 14–54)
AST: 336 U/L — ABNORMAL HIGH (ref 15–41)
Albumin: 3 g/dL — ABNORMAL LOW (ref 3.5–5.0)
Alkaline Phosphatase: 109 U/L (ref 38–126)
Anion gap: 8 (ref 5–15)
BILIRUBIN TOTAL: 1.9 mg/dL — AB (ref 0.3–1.2)
BUN: 7 mg/dL (ref 6–20)
CO2: 22 mmol/L (ref 22–32)
CREATININE: 0.64 mg/dL (ref 0.44–1.00)
Calcium: 8.2 mg/dL — ABNORMAL LOW (ref 8.9–10.3)
Chloride: 109 mmol/L (ref 101–111)
GFR calc Af Amer: >60 mL/min (ref >60)
Glucose, Bld: 126 mg/dL — ABNORMAL HIGH (ref 65–99)
POTASSIUM: 3.7 mmol/L (ref 3.5–5.1)
Sodium: 139 mmol/L (ref 135–145)
TOTAL PROTEIN: 6.3 g/dL — AB (ref 6.5–8.1)

## 2017-11-01 LAB — HIV ANTIBODY (ROUTINE TESTING W REFLEX): HIV Screen 4th Generation wRfx: NONREACTIVE

## 2017-11-01 MED ORDER — DIPHENHYDRAMINE HCL 50 MG/ML IJ SOLN: 12.5000 mg | Freq: Four times a day (QID) | INTRAMUSCULAR | Status: DC | PRN

## 2017-11-01 MED ORDER — ENOXAPARIN SODIUM 40 MG/0.4ML ~~LOC~~ SOLN
40.0000 mg | SUBCUTANEOUS | Status: DC
  Filled 2017-11-01 (×3): qty 0.4

## 2017-11-01 MED ORDER — ONDANSETRON HCL 4 MG/2ML IJ SOLN
4.0000 mg | Freq: Four times a day (QID) | INTRAMUSCULAR | Status: DC | PRN
  Administered 2017-11-01: 4 mg via INTRAVENOUS
  Filled 2017-11-01: qty 2

## 2017-11-01 MED ORDER — GADOBENATE DIMEGLUMINE 529 MG/ML IV SOLN
30.0000 mL | Freq: Once | INTRAVENOUS | Status: AC
  Administered 2017-11-01: 30 mL via INTRAVENOUS

## 2017-11-01 MED ORDER — ACETAMINOPHEN 650 MG RE SUPP: 650.0000 mg | Freq: Four times a day (QID) | RECTAL | Status: DC | PRN

## 2017-11-01 MED ORDER — ACETAMINOPHEN 325 MG PO TABS
650.0000 mg | ORAL_TABLET | Freq: Four times a day (QID) | ORAL | Status: DC | PRN
  Administered 2017-11-01: 650 mg via ORAL
  Filled 2017-11-01: qty 2

## 2017-11-01 MED ORDER — MORPHINE SULFATE (PF) 4 MG/ML IV SOLN
2.0000 mg | INTRAVENOUS | Status: DC | PRN
  Administered 2017-11-01: 2 mg via INTRAVENOUS
  Filled 2017-11-01: qty 1

## 2017-11-01 MED ORDER — TECHNETIUM TC 99M MEBROFENIN IV KIT
5.0000 | PACK | Freq: Once | INTRAVENOUS | Status: AC | PRN
  Administered 2017-11-01: 5 via INTRAVENOUS

## 2017-11-01 MED ORDER — ONDANSETRON 4 MG PO TBDP: 4.0000 mg | ORAL_TABLET | Freq: Four times a day (QID) | ORAL | Status: DC | PRN

## 2017-11-01 MED ORDER — DIPHENHYDRAMINE HCL 12.5 MG/5ML PO ELIX: 12.5000 mg | ORAL_SOLUTION | Freq: Four times a day (QID) | ORAL | Status: DC | PRN

## 2017-11-01 NOTE — ED Notes (Signed)
Family at bedside. 

## 2017-11-01 NOTE — ED Notes (Signed)
nuc med advised they would be coming to get patient soon for hepatobiliary scan

## 2017-11-01 NOTE — ED Notes (Signed)
Patient in nuclear med.  

## 2017-11-01 NOTE — Progress Notes (Signed)
Subjective/Chief Complaint: Pt comfortable    Objective: Vital signs in last 24 hours: Temp:  [99.2 F (37.3 C)] 99.2 F (37.3 C) (05/07 1743) Pulse Rate:  [66-98] 87 (05/08 0730) Resp:  [10-34] 11 (05/08 0730) BP: (109-139)/(62-99) 114/82 (05/08 0730) SpO2:  [92 %-100 %] 93 % (05/08 0730)    Intake/Output from previous day: 05/07 0701 - 05/08 0700 In: 2000 [I.V.:2000] Out: -  Intake/Output this shift: No intake/output data recorded.  Incision/Wound:CDI  No peritonitis   Lab Results:  Recent Labs    10/31/17 1758 11/01/17 0250  WBC 19.7* 12.8*  HGB 14.1 12.1  HCT 43.4 37.5  PLT 270 230   BMET Recent Labs    10/31/17 1758 11/01/17 0250  NA 142 139  K 4.1 3.7  CL 106 109  CO2 25 22  GLUCOSE 161* 126*  BUN 13 7  CREATININE 0.77 0.64  CALCIUM 9.5 8.2*   PT/INR No results for input(s): LABPROT, INR in the last 72 hours. ABG No results for input(s): PHART, HCO3 in the last 72 hours.  Invalid input(s): PCO2, PO2  Studies/Results: Dg Chest 2 View  Result Date: 10/31/2017 CLINICAL DATA:  32 year old female with chest pain radiating to the back since 1530 hours today. Laparoscopic cholecystectomy postoperative day 5. EXAM: CHEST - 2 VIEW COMPARISON:  CT Abdomen and Pelvis 10/07/2017. Chest radiographs 10/06/2017. FINDINGS: Low lung volumes. Mediastinal contours remain normal. Visualized tracheal air column is within normal limits. No pneumothorax or pneumoperitoneum. No pulmonary edema, pleural effusion or confluent pulmonary opacity. Cholecystectomy clips in the right upper quadrant. Visible bowel gas pattern is within normal limits. No osseous abnormality identified. IMPRESSION: 1. Low lung volumes, otherwise no acute cardiopulmonary abnormality. 2. Cholecystectomy clips in the right upper quadrant. Visible bowel gas pattern within normal limits. Electronically Signed   By: Odessa Fleming M.D.   On: 10/31/2017 20:17   Ct Angio Chest Pe W And/or Wo Contrast  Result  Date: 10/31/2017 CLINICAL DATA:  Shortness of breath with flank pain and back pain. EXAM: CT ANGIOGRAPHY CHEST WITH CONTRAST TECHNIQUE: Multidetector CT imaging of the chest was performed using the standard protocol during bolus administration of intravenous contrast. Multiplanar CT image reconstructions and MIPs were obtained to evaluate the vascular anatomy. CONTRAST:  ISOVUE-370 IOPAMIDOL (ISOVUE-370) INJECTION 76% COMPARISON:  Chest radiograph 10/31/2017 FINDINGS: Cardiovascular: Contrast injection is sufficient to demonstrate satisfactory opacification of the pulmonary arteries to the segmental level. There is no pulmonary embolus. The main pulmonary artery is within normal limits for size. There is a normal 3-vessel arch branching pattern without evidence of acute aortic syndrome. There is noaortic atherosclerosis. Heart size is normal, without pericardial effusion. Mediastinum/Nodes: No mediastinal, hilar or axillary lymphadenopathy. The visualized thyroid and thoracic esophageal course are unremarkable. Lungs/Pleura: No pulmonary nodules or masses. No pleural effusion or pneumothorax. No focal airspace consolidation. No focal pleural abnormality. Upper Abdomen: Contrast bolus timing is not optimized for evaluation of the abdominal organs. Within this limitation, the visualized organs of the upper abdomen are normal. Musculoskeletal: No chest wall abnormality. No acute or significant osseous findings. Review of the MIP images confirms the above findings. IMPRESSION: No pulmonary embolus or other acute thoracic abnormality. Electronically Signed   By: Deatra Robinson M.D.   On: 10/31/2017 22:03    Anti-infectives: Anti-infectives (From admission, onward)   None      Assessment/Plan: S/p lap chole  Admit for abdominal pain evaluation CT shows no fluid so leak less likely Check HIDA and will  need MRCP    LOS: 0 days    Clovis Pu Siani Utke 11/01/2017

## 2017-11-02 LAB — HEPATIC FUNCTION PANEL
ALT: 378 U/L — AB (ref 14–54)
AST: 207 U/L — ABNORMAL HIGH (ref 15–41)
Albumin: 3.1 g/dL — ABNORMAL LOW (ref 3.5–5.0)
Alkaline Phosphatase: 138 U/L — ABNORMAL HIGH (ref 38–126)
BILIRUBIN INDIRECT: 0.8 mg/dL (ref 0.3–0.9)
Bilirubin, Direct: 0.4 mg/dL (ref 0.1–0.5)
TOTAL PROTEIN: 6.5 g/dL (ref 6.5–8.1)
Total Bilirubin: 1.2 mg/dL (ref 0.3–1.2)

## 2017-11-02 NOTE — Progress Notes (Signed)
Subjective/Chief Complaint: Denies abdominal pain HIDA and  MRCP normal Liver function slightly abnormal but stable  No back or chest pain   Objective: Vital signs in last 24 hours: Temp:  [98.6 F (37 C)-98.9 F (37.2 C)] 98.6 F (37 C) (05/09 0509) Pulse Rate:  [67-89] 67 (05/09 0509) Resp:  [11-16] 16 (05/09 0509) BP: (114-136)/(82-99) 133/95 (05/09 0509) SpO2:  [93 %-100 %] 100 % (05/09 0509) Last BM Date: 10/30/17  Intake/Output from previous day: 05/08 0701 - 05/09 0700 In: -  Out: 1500 [Urine:1500] Intake/Output this shift: No intake/output data recorded.  Incision/Wound:CDI soft no peritonits mild soreness   Lab Results:  Recent Labs    11/01/17 0250 11/01/17 1341  WBC 12.8* 8.5  HGB 12.1 13.0  HCT 37.5 39.8  PLT 230 223   BMET Recent Labs    10/31/17 1758 11/01/17 0250  NA 142 139  K 4.1 3.7  CL 106 109  CO2 25 22  GLUCOSE 161* 126*  BUN 13 7  CREATININE 0.77 0.64  CALCIUM 9.5 8.2*   PT/INR No results for input(s): LABPROT, INR in the last 72 hours. ABG No results for input(s): PHART, HCO3 in the last 72 hours.  Invalid input(s): PCO2, PO2  Studies/Results: Dg Chest 2 View  Result Date: 10/31/2017 CLINICAL DATA:  32 year old female with chest pain radiating to the back since 1530 hours today. Laparoscopic cholecystectomy postoperative day 5. EXAM: CHEST - 2 VIEW COMPARISON:  CT Abdomen and Pelvis 10/07/2017. Chest radiographs 10/06/2017. FINDINGS: Low lung volumes. Mediastinal contours remain normal. Visualized tracheal air column is within normal limits. No pneumothorax or pneumoperitoneum. No pulmonary edema, pleural effusion or confluent pulmonary opacity. Cholecystectomy clips in the right upper quadrant. Visible bowel gas pattern is within normal limits. No osseous abnormality identified. IMPRESSION: 1. Low lung volumes, otherwise no acute cardiopulmonary abnormality. 2. Cholecystectomy clips in the right upper quadrant. Visible bowel  gas pattern within normal limits. Electronically Signed   By: Odessa Fleming M.D.   On: 10/31/2017 20:17   Ct Angio Chest Pe W And/or Wo Contrast  Result Date: 10/31/2017 CLINICAL DATA:  Shortness of breath with flank pain and back pain. EXAM: CT ANGIOGRAPHY CHEST WITH CONTRAST TECHNIQUE: Multidetector CT imaging of the chest was performed using the standard protocol during bolus administration of intravenous contrast. Multiplanar CT image reconstructions and MIPs were obtained to evaluate the vascular anatomy. CONTRAST:  ISOVUE-370 IOPAMIDOL (ISOVUE-370) INJECTION 76% COMPARISON:  Chest radiograph 10/31/2017 FINDINGS: Cardiovascular: Contrast injection is sufficient to demonstrate satisfactory opacification of the pulmonary arteries to the segmental level. There is no pulmonary embolus. The main pulmonary artery is within normal limits for size. There is a normal 3-vessel arch branching pattern without evidence of acute aortic syndrome. There is noaortic atherosclerosis. Heart size is normal, without pericardial effusion. Mediastinum/Nodes: No mediastinal, hilar or axillary lymphadenopathy. The visualized thyroid and thoracic esophageal course are unremarkable. Lungs/Pleura: No pulmonary nodules or masses. No pleural effusion or pneumothorax. No focal airspace consolidation. No focal pleural abnormality. Upper Abdomen: Contrast bolus timing is not optimized for evaluation of the abdominal organs. Within this limitation, the visualized organs of the upper abdomen are normal. Musculoskeletal: No chest wall abnormality. No acute or significant osseous findings. Review of the MIP images confirms the above findings. IMPRESSION: No pulmonary embolus or other acute thoracic abnormality. Electronically Signed   By: Deatra Robinson M.D.   On: 10/31/2017 22:03   Nm Hepatobiliary Liver Func  Result Date: 11/01/2017 CLINICAL DATA:  RIGHT  upper quadrant pain, fever, leukocytosis, nausea, post cholecystectomy, question bile  leak versus CBD obstruction EXAM: NUCLEAR MEDICINE HEPATOBILIARY IMAGING TECHNIQUE: Sequential images of the abdomen were obtained out to 60 minutes following intravenous administration of radiopharmaceutical. RADIOPHARMACEUTICALS:  5.2 mCi Tc-44m  Choletec IV COMPARISON:  None; correlation CT abdomen and pelvis 10/07/2017 FINDINGS: Adequate clearance of tracer from bloodstream. Delayed excretion of tracer into common bile duct. Faint tracer within small bowel is visualized beginning at 30 minutes. Continued imaging for second hour reveals progressive tracer passage into small bowel loops consistent with a patent CBD. However prolonged hepatocellular retention of tracer is seen consistent with hepatocellular dysfunction. No perihepatic tracer accumulation is identified to suggest bile leak. IMPRESSION: Patent CBD without scintigraphic evidence of bile leak. 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 Electronically Signed   By: Ulyses Southward M.D.   On: 11/01/2017 13:22   Mr 3d Recon At Scanner  Result Date: 11/01/2017 CLINICAL DATA:  Severe right upper quadrant abdominal pain with nausea. Laparoscopic cholecystectomy 10/26/2017. EXAM: MRI ABDOMEN WITHOUT AND WITH CONTRAST (INCLUDING MRCP) TECHNIQUE: Multiplanar multisequence MR imaging of the abdomen was performed both before and after the administration of intravenous contrast. Heavily T2-weighted images of the biliary and pancreatic ducts were obtained, and three-dimensional MRCP images were rendered by post processing. CONTRAST:  30mL MULTIHANCE GADOBENATE DIMEGLUMINE 529 MG/ML IV SOLN COMPARISON:  Nuclear medicine hepatobiliary scan 11/01/2017. Abdominopelvic CT 10/07/2017. FINDINGS: Lower chest: Suboptimal inspiration without suspicious findings at the visualized lung bases. Hepatobiliary: The liver demonstrates mild loss of signal on the gradient echo opposed phase images, consistent with steatosis. No focal  hepatic lesion or abnormal enhancement seen. Expected appearance of the cholecystectomy bed status post recent surgery. No significant fluid collection. There is no intra hepatic or significant extrahepatic biliary dilatation. The common hepatic duct measures 8 mm maximally. There is no evidence of choledocholithiasis. Pancreas: Unremarkable. No pancreatic ductal dilatation or surrounding inflammatory changes. Pancreatic ductal anatomy not well visualized, although appears normal. Spleen: Normal in size without focal abnormality. Adrenals/Urinary Tract: Both adrenal glands appear normal. Bilateral renal cortical scarring is again noted as seen on preoperative CT. There is a questionable small filling defect at the left ureteral pelvic junction, best seen on coronal T2 image 14/4 and axial image 3/7. Previous CT demonstrated a small calculus in the lower pole of the left kidney, and this could reflect a calculus which has moved to the ureteropelvic junction. However, there is no significant hydronephrosis or delayed contrast excretion. Stomach/Bowel: No evidence of bowel wall thickening, distention or surrounding inflammatory change. Vascular/Lymphatic: There are no enlarged abdominal lymph nodes. No significant vascular findings. Other: No ascites or mesenteric edema. Musculoskeletal: No acute or significant osseous findings. IMPRESSION: 1. No demonstrated complication from recent laparoscopic cholecystectomy. Borderline biliary dilatation, similar to previous CT without evidence of choledocholithiasis. 2. The pancreas appears normal. 3. Possible small calculus at the left ureteropelvic junction without hydronephrosis or significant delay in contrast excretion. Bilateral renal cortical scarring. Electronically Signed   By: Carey Bullocks M.D.   On: 11/01/2017 13:51   Mr Abdomen Mrcp Vivien Rossetti Contast  Result Date: 11/01/2017 CLINICAL DATA:  Severe right upper quadrant abdominal pain with nausea. Laparoscopic  cholecystectomy 10/26/2017. EXAM: MRI ABDOMEN WITHOUT AND WITH CONTRAST (INCLUDING MRCP) TECHNIQUE: Multiplanar multisequence MR imaging of the abdomen was performed both before and after the administration of intravenous contrast. Heavily T2-weighted images of the biliary and pancreatic ducts were obtained, and three-dimensional MRCP images were rendered by post  processing. CONTRAST:  30mL MULTIHANCE GADOBENATE DIMEGLUMINE 529 MG/ML IV SOLN COMPARISON:  Nuclear medicine hepatobiliary scan 11/01/2017. Abdominopelvic CT 10/07/2017. FINDINGS: Lower chest: Suboptimal inspiration without suspicious findings at the visualized lung bases. Hepatobiliary: The liver demonstrates mild loss of signal on the gradient echo opposed phase images, consistent with steatosis. No focal hepatic lesion or abnormal enhancement seen. Expected appearance of the cholecystectomy bed status post recent surgery. No significant fluid collection. There is no intra hepatic or significant extrahepatic biliary dilatation. The common hepatic duct measures 8 mm maximally. There is no evidence of choledocholithiasis. Pancreas: Unremarkable. No pancreatic ductal dilatation or surrounding inflammatory changes. Pancreatic ductal anatomy not well visualized, although appears normal. Spleen: Normal in size without focal abnormality. Adrenals/Urinary Tract: Both adrenal glands appear normal. Bilateral renal cortical scarring is again noted as seen on preoperative CT. There is a questionable small filling defect at the left ureteral pelvic junction, best seen on coronal T2 image 14/4 and axial image 3/7. Previous CT demonstrated a small calculus in the lower pole of the left kidney, and this could reflect a calculus which has moved to the ureteropelvic junction. However, there is no significant hydronephrosis or delayed contrast excretion. Stomach/Bowel: No evidence of bowel wall thickening, distention or surrounding inflammatory change. Vascular/Lymphatic:  There are no enlarged abdominal lymph nodes. No significant vascular findings. Other: No ascites or mesenteric edema. Musculoskeletal: No acute or significant osseous findings. IMPRESSION: 1. No demonstrated complication from recent laparoscopic cholecystectomy. Borderline biliary dilatation, similar to previous CT without evidence of choledocholithiasis. 2. The pancreas appears normal. 3. Possible small calculus at the left ureteropelvic junction without hydronephrosis or significant delay in contrast excretion. Bilateral renal cortical scarring. Electronically Signed   By: Carey Bullocks M.D.   On: 11/01/2017 13:51    Anti-infectives: Anti-infectives (From admission, onward)   None      Assessment/Plan: Post  Op pain 5 days s/p lap chole        No surgical complication identified  No pancreatitis  No leak Pain free today Advance diet and keep on low fat small portions  Etiology of pain unclear  Discharge home later today if she tolerates diet    LOS: 0 days    Maisie Fus A Jan Olano 11/02/2017

## 2017-11-02 NOTE — Discharge Summary (Signed)
Physician Discharge Summary  Patient ID: Lauren Cardenas MRN: [id number] DOB/AGE: [birth year] 32 y.o.  Admit date: 10/31/2017 Discharge date: 11/02/2017  Admission Diagnoses: postoperative abdominal pain  Discharge Diagnoses:  Active Problems:   Postoperative upper abdominal pain   Discharged Condition: good  Hospital Course: Pt admitted 4 days after uneventful laparoscopic cholecystectomy for gallstones.  Work up included CT chest which was negative for PE, cardiac  work up which was negative ,  MRCP and HIDA which were both normal. Her pain resolved.  She did have mild elevations of her transaminases and bilirubin and signs of hepatocellular disfunction on HIDA but no clear etiology for this.   She was not jaundiced.  She felt ready for discharge and will keep a low fat diet and be more compliant with  Her medications.  She will avoid alcohol.    Consults: None  Significant Diagnostic Studies: labs:  CMP Latest Ref Rng & Units 11/02/2017 11/01/2017 10/31/2017  Glucose 65 - 99 mg/dL - 413(K) 440(N)  BUN 6 - 20 mg/dL - 7 13  Creatinine 0.27 - 1.00 mg/dL - 2.53 6.64  Sodium 403 - 145 mmol/L - 139 142  Potassium 3.5 - 5.1 mmol/L - 3.7 4.1  Chloride 101 - 111 mmol/L - 109 106  CO2 22 - 32 mmol/L - 22 25  Calcium 8.9 - 10.3 mg/dL - 8.2(L) 9.5  Total Protein 6.5 - 8.1 g/dL 6.5 6.3(L) 7.3  Total Bilirubin 0.3 - 1.2 mg/dL 1.2 4.7(Q) 2.5(Z)  Alkaline Phos 38 - 126 U/L 138(H) 109 105  AST 15 - 41 U/L 207(H) 336(H) 144(H)  ALT 14 - 54 U/L 378(H) 240(H) 79(H)    Treatments: IV hydration  Discharge Exam: Blood pressure (!) 133/95, pulse 67, temperature 98.6 F (37 C), temperature source Oral, resp. rate 16, last menstrual period 10/01/2017, SpO2 100 %. General appearance: alert and cooperative Resp: clear to auscultation bilaterally Cardio: regular rate and rhythm, S1, S2 normal, no murmur, click, rub or gallop Incision/Wound:CDI soft non tender   Disposition: Discharge disposition: 01-Home  or Self Care       Discharge Instructions    Diet - low sodium heart healthy   Complete by:  As directed    Increase activity slowly   Complete by:  As directed         Signed: Clovis Pu [place] Moskowitz 11/02/2017, 7:20 AM

## 2017-11-02 NOTE — Discharge Instructions (Signed)
Low fat diet Avoid heavy large meals for 2 weeks  Take medications as directed  Follow up 2 - 3 weeks  Avoid alcohol

## 2017-11-02 NOTE — Progress Notes (Signed)
Patient discharged to home. Verbalizes understanding of all discharge instructions including discharge medications (no changes to PTA meds) and follow up MD visits.

## 2017-11-04 ENCOUNTER — Inpatient Hospital Stay (HOSPITAL_COMMUNITY): Payer: Medicaid Other | Admitting: Certified Registered Nurse Anesthetist

## 2017-11-04 ENCOUNTER — Encounter (HOSPITAL_COMMUNITY)
Admission: EM | Disposition: A | Discharge status: Home / Self Care | Payer: Self-pay | Source: Home / Self Care | Attending: Internal Medicine

## 2017-11-04 ENCOUNTER — Inpatient Hospital Stay (HOSPITAL_COMMUNITY)
Admission: EM | Admit: 2017-11-04 | Discharge: 2017-11-09 | DRG: 854 | Disposition: A | Discharge status: Home / Self Care | Payer: Medicaid Other | Attending: Internal Medicine | Admitting: Internal Medicine

## 2017-11-04 ENCOUNTER — Inpatient Hospital Stay (HOSPITAL_COMMUNITY): Payer: Medicaid Other

## 2017-11-04 ENCOUNTER — Other Ambulatory Visit: Payer: Self-pay

## 2017-11-04 ENCOUNTER — Emergency Department (HOSPITAL_COMMUNITY): Payer: Medicaid Other

## 2017-11-04 ENCOUNTER — Encounter (HOSPITAL_COMMUNITY): Payer: Self-pay | Admitting: *Deleted

## 2017-11-04 DIAGNOSIS — E876 Hypokalemia: Secondary | ICD-10-CM | POA: Diagnosis not present

## 2017-11-04 DIAGNOSIS — N200 Calculus of kidney: Secondary | ICD-10-CM

## 2017-11-04 DIAGNOSIS — G43909 Migraine, unspecified, not intractable, without status migrainosus: Secondary | ICD-10-CM | POA: Diagnosis present

## 2017-11-04 DIAGNOSIS — Z880 Allergy status to penicillin: Secondary | ICD-10-CM | POA: Diagnosis not present

## 2017-11-04 DIAGNOSIS — Z79899 Other long term (current) drug therapy: Secondary | ICD-10-CM | POA: Diagnosis not present

## 2017-11-04 DIAGNOSIS — F419 Anxiety disorder, unspecified: Secondary | ICD-10-CM | POA: Diagnosis present

## 2017-11-04 DIAGNOSIS — Z6832 Body mass index (BMI) 32.0-32.9, adult: Secondary | ICD-10-CM | POA: Diagnosis not present

## 2017-11-04 DIAGNOSIS — N136 Pyonephrosis: Secondary | ICD-10-CM | POA: Diagnosis present

## 2017-11-04 DIAGNOSIS — A419 Sepsis, unspecified organism: Secondary | ICD-10-CM | POA: Diagnosis present

## 2017-11-04 DIAGNOSIS — N1370 Vesicoureteral-reflux, unspecified: Secondary | ICD-10-CM | POA: Diagnosis present

## 2017-11-04 DIAGNOSIS — Z888 Allergy status to other drugs, medicaments and biological substances status: Secondary | ICD-10-CM | POA: Diagnosis not present

## 2017-11-04 DIAGNOSIS — N12 Tubulo-interstitial nephritis, not specified as acute or chronic: Secondary | ICD-10-CM | POA: Diagnosis present

## 2017-11-04 DIAGNOSIS — Z881 Allergy status to other antibiotic agents status: Secondary | ICD-10-CM

## 2017-11-04 DIAGNOSIS — E669 Obesity, unspecified: Secondary | ICD-10-CM | POA: Diagnosis present

## 2017-11-04 DIAGNOSIS — D72829 Elevated white blood cell count, unspecified: Secondary | ICD-10-CM | POA: Diagnosis not present

## 2017-11-04 DIAGNOSIS — N10 Acute tubulo-interstitial nephritis: Secondary | ICD-10-CM | POA: Diagnosis not present

## 2017-11-04 DIAGNOSIS — R945 Abnormal results of liver function studies: Secondary | ICD-10-CM | POA: Diagnosis present

## 2017-11-04 DIAGNOSIS — N201 Calculus of ureter: Secondary | ICD-10-CM | POA: Diagnosis not present

## 2017-11-04 DIAGNOSIS — N179 Acute kidney failure, unspecified: Secondary | ICD-10-CM | POA: Diagnosis present

## 2017-11-04 DIAGNOSIS — I10 Essential (primary) hypertension: Secondary | ICD-10-CM | POA: Diagnosis present

## 2017-11-04 DIAGNOSIS — R079 Chest pain, unspecified: Secondary | ICD-10-CM | POA: Diagnosis not present

## 2017-11-04 HISTORY — PX: CYSTOSCOPY WITH STENT PLACEMENT: SHX5790

## 2017-11-04 LAB — CBC WITH DIFFERENTIAL/PLATELET
BASOS ABS: 0 10*3/uL (ref 0.0–0.1)
Basophils Relative: 0 %
EOS ABS: 0 10*3/uL (ref 0.0–0.7)
Eosinophils Relative: 0 %
HCT: 40.2 % (ref 36.0–46.0)
Hemoglobin: 12.9 g/dL (ref 12.0–15.0)
LYMPHS ABS: 2.1 10*3/uL (ref 0.7–4.0)
Lymphocytes Relative: 8 %
MCH: 27 pg (ref 26.0–34.0)
MCHC: 32.1 g/dL (ref 30.0–36.0)
MCV: 84.3 fL (ref 78.0–100.0)
Monocytes Absolute: 1.3 10*3/uL — ABNORMAL HIGH (ref 0.1–1.0)
Monocytes Relative: 5 %
NEUTROS PCT: 87 %
Neutro Abs: 22.9 10*3/uL — ABNORMAL HIGH (ref 1.7–7.7)
PLATELETS: 232 10*3/uL (ref 150–400)
RBC: 4.77 MIL/uL (ref 3.87–5.11)
RDW: 13.8 % (ref 11.5–15.5)
WBC Morphology: INCREASED
WBC: 26.3 10*3/uL — AB (ref 4.0–10.5)

## 2017-11-04 LAB — URINALYSIS, ROUTINE W REFLEX MICROSCOPIC
BILIRUBIN URINE: NEGATIVE
Glucose, UA: NEGATIVE mg/dL
KETONES UR: 20 mg/dL — AB
NITRITE: NEGATIVE
Protein, ur: 30 mg/dL — AB
Specific Gravity, Urine: 1.025 (ref 1.005–1.030)
pH: 5 (ref 5.0–8.0)

## 2017-11-04 LAB — COMPREHENSIVE METABOLIC PANEL
ALT: 170 U/L — ABNORMAL HIGH (ref 14–54)
ANION GAP: 10 (ref 5–15)
AST: 50 U/L — AB (ref 15–41)
Albumin: 3.6 g/dL (ref 3.5–5.0)
Alkaline Phosphatase: 106 U/L (ref 38–126)
BUN: 14 mg/dL (ref 6–20)
CHLORIDE: 106 mmol/L (ref 101–111)
CO2: 24 mmol/L (ref 22–32)
Calcium: 9 mg/dL (ref 8.9–10.3)
Creatinine, Ser: 1.33 mg/dL — ABNORMAL HIGH (ref 0.44–1.00)
GFR calc Af Amer: >60 mL/min (ref >60)
GFR calc non Af Amer: 52 mL/min — ABNORMAL LOW (ref >60)
GLUCOSE: 105 mg/dL — AB (ref 65–99)
Potassium: 3.7 mmol/L (ref 3.5–5.1)
SODIUM: 140 mmol/L (ref 135–145)
TOTAL PROTEIN: 7.3 g/dL (ref 6.5–8.1)
Total Bilirubin: 0.9 mg/dL (ref 0.3–1.2)

## 2017-11-04 LAB — I-STAT CG4 LACTIC ACID, ED
LACTIC ACID, VENOUS: 1.44 mmol/L (ref 0.5–1.9)
Lactic Acid, Venous: 1.4 mmol/L (ref 0.5–1.9)

## 2017-11-04 LAB — LIPASE, BLOOD: Lipase: 23 U/L (ref 11–51)

## 2017-11-04 SURGERY — CYSTOSCOPY, WITH STENT INSERTION
Anesthesia: General | Site: Ureter | Laterality: Left

## 2017-11-04 MED ORDER — PROPOFOL 10 MG/ML IV BOLUS
INTRAVENOUS | Status: DC | PRN
  Administered 2017-11-04: 150 mg via INTRAVENOUS

## 2017-11-04 MED ORDER — ONDANSETRON HCL 4 MG PO TABS: 4.0000 mg | ORAL_TABLET | Freq: Four times a day (QID) | ORAL | Status: DC | PRN

## 2017-11-04 MED ORDER — PROMETHAZINE HCL 25 MG/ML IJ SOLN: 6.2500 mg | INTRAMUSCULAR | Status: DC | PRN

## 2017-11-04 MED ORDER — PROPRANOLOL HCL ER 120 MG PO CP24
120.0000 mg | ORAL_CAPSULE | Freq: Every day | ORAL | Status: DC
  Administered 2017-11-04 – 2017-11-08 (×5): 120 mg via ORAL
  Filled 2017-11-04 (×5): qty 1

## 2017-11-04 MED ORDER — ACETAMINOPHEN 10 MG/ML IV SOLN
INTRAVENOUS | Status: AC
  Administered 2017-11-04: 1000 mg via INTRAVENOUS
  Filled 2017-11-04: qty 100

## 2017-11-04 MED ORDER — SUMATRIPTAN SUCCINATE 25 MG PO TABS
25.0000 mg | ORAL_TABLET | ORAL | Status: DC | PRN
  Filled 2017-11-04: qty 1

## 2017-11-04 MED ORDER — CIPROFLOXACIN IN D5W 400 MG/200ML IV SOLN
400.0000 mg | Freq: Once | INTRAVENOUS | Status: AC
  Administered 2017-11-04: 400 mg via INTRAVENOUS
  Filled 2017-11-04: qty 200

## 2017-11-04 MED ORDER — FENTANYL CITRATE (PF) 100 MCG/2ML IJ SOLN
INTRAMUSCULAR | Status: DC | PRN
  Administered 2017-11-04 (×2): 25 ug via INTRAVENOUS

## 2017-11-04 MED ORDER — MIDAZOLAM HCL 2 MG/2ML IJ SOLN
INTRAMUSCULAR | Status: AC
  Filled 2017-11-04: qty 2

## 2017-11-04 MED ORDER — ACETAMINOPHEN 650 MG RE SUPP: 650.0000 mg | Freq: Four times a day (QID) | RECTAL | Status: DC | PRN

## 2017-11-04 MED ORDER — DEXAMETHASONE SODIUM PHOSPHATE 10 MG/ML IJ SOLN
INTRAMUSCULAR | Status: DC | PRN
  Administered 2017-11-04: 10 mg via INTRAVENOUS

## 2017-11-04 MED ORDER — RIZATRIPTAN BENZOATE 10 MG PO TBDP: 10.0000 mg | ORAL_TABLET | Freq: Three times a day (TID) | ORAL | Status: DC | PRN

## 2017-11-04 MED ORDER — MIDAZOLAM HCL 5 MG/5ML IJ SOLN
INTRAMUSCULAR | Status: DC | PRN
  Administered 2017-11-04: 2 mg via INTRAVENOUS

## 2017-11-04 MED ORDER — ONDANSETRON HCL 4 MG/2ML IJ SOLN
4.0000 mg | Freq: Once | INTRAMUSCULAR | Status: AC
  Administered 2017-11-04: 4 mg via INTRAVENOUS
  Filled 2017-11-04: qty 2

## 2017-11-04 MED ORDER — KETOROLAC TROMETHAMINE 30 MG/ML IJ SOLN
30.0000 mg | Freq: Four times a day (QID) | INTRAMUSCULAR | Status: DC | PRN
  Administered 2017-11-04 – 2017-11-06 (×4): 30 mg via INTRAVENOUS
  Filled 2017-11-04 (×4): qty 1

## 2017-11-04 MED ORDER — SODIUM CHLORIDE 0.9 % IV SOLN
INTRAVENOUS | Status: DC
  Administered 2017-11-04 (×2): via INTRAVENOUS
  Administered 2017-11-05: 1000 mL via INTRAVENOUS
  Administered 2017-11-06 – 2017-11-08 (×6): via INTRAVENOUS

## 2017-11-04 MED ORDER — ONDANSETRON HCL 4 MG/2ML IJ SOLN
INTRAMUSCULAR | Status: DC | PRN
  Administered 2017-11-04: 4 mg via INTRAVENOUS

## 2017-11-04 MED ORDER — FENTANYL CITRATE (PF) 100 MCG/2ML IJ SOLN
INTRAMUSCULAR | Status: AC
  Filled 2017-11-04: qty 2

## 2017-11-04 MED ORDER — CIPROFLOXACIN IN D5W 400 MG/200ML IV SOLN
INTRAVENOUS | Status: AC
  Filled 2017-11-04: qty 200

## 2017-11-04 MED ORDER — ACETAMINOPHEN 10 MG/ML IV SOLN
1000.0000 mg | Freq: Once | INTRAVENOUS | Status: AC
  Administered 2017-11-04: 1000 mg via INTRAVENOUS

## 2017-11-04 MED ORDER — ACETAMINOPHEN 325 MG PO TABS
650.0000 mg | ORAL_TABLET | Freq: Once | ORAL | Status: AC
  Administered 2017-11-04: 650 mg via ORAL
  Filled 2017-11-04: qty 2

## 2017-11-04 MED ORDER — SENNOSIDES-DOCUSATE SODIUM 8.6-50 MG PO TABS: 1.0000 | ORAL_TABLET | Freq: Every evening | ORAL | Status: DC | PRN

## 2017-11-04 MED ORDER — HYDROCODONE-ACETAMINOPHEN 7.5-325 MG PO TABS
1.0000 | ORAL_TABLET | Freq: Four times a day (QID) | ORAL | Status: DC | PRN
  Administered 2017-11-05: 1 via ORAL
  Filled 2017-11-04: qty 1

## 2017-11-04 MED ORDER — HYDROMORPHONE HCL 1 MG/ML IJ SOLN
0.5000 mg | Freq: Once | INTRAMUSCULAR | Status: AC
  Administered 2017-11-04: 0.5 mg via INTRAVENOUS
  Filled 2017-11-04: qty 1

## 2017-11-04 MED ORDER — IOHEXOL 300 MG/ML  SOLN
INTRAMUSCULAR | Status: DC | PRN
  Administered 2017-11-04: 10 mL

## 2017-11-04 MED ORDER — ACETAMINOPHEN 325 MG PO TABS
650.0000 mg | ORAL_TABLET | Freq: Four times a day (QID) | ORAL | Status: DC | PRN
  Administered 2017-11-06 – 2017-11-07 (×4): 650 mg via ORAL
  Filled 2017-11-04 (×4): qty 2

## 2017-11-04 MED ORDER — HYDROMORPHONE HCL 1 MG/ML IJ SOLN: 0.2500 mg | INTRAMUSCULAR | Status: DC | PRN

## 2017-11-04 MED ORDER — SODIUM CHLORIDE 0.9 % IR SOLN
Status: DC | PRN
  Administered 2017-11-04: 3000 mL

## 2017-11-04 MED ORDER — LIDOCAINE 2% (20 MG/ML) 5 ML SYRINGE
INTRAMUSCULAR | Status: DC | PRN
  Administered 2017-11-04: 50 mg via INTRAVENOUS

## 2017-11-04 MED ORDER — PROPOFOL 10 MG/ML IV BOLUS
INTRAVENOUS | Status: AC
  Filled 2017-11-04: qty 20

## 2017-11-04 MED ORDER — CIPROFLOXACIN IN D5W 400 MG/200ML IV SOLN
400.0000 mg | Freq: Two times a day (BID) | INTRAVENOUS | Status: DC
  Administered 2017-11-04 – 2017-11-05 (×4): 400 mg via INTRAVENOUS
  Filled 2017-11-04 (×3): qty 200

## 2017-11-04 MED ORDER — SODIUM CHLORIDE 0.9 % IV BOLUS
500.0000 mL | Freq: Once | INTRAVENOUS | Status: AC
  Administered 2017-11-04: 500 mL via INTRAVENOUS

## 2017-11-04 MED ORDER — ONDANSETRON HCL 4 MG/2ML IJ SOLN
4.0000 mg | Freq: Four times a day (QID) | INTRAMUSCULAR | Status: DC | PRN
  Administered 2017-11-06 – 2017-11-08 (×4): 4 mg via INTRAVENOUS
  Filled 2017-11-04 (×4): qty 2

## 2017-11-04 MED ORDER — MEPERIDINE HCL 50 MG/ML IJ SOLN: 6.2500 mg | INTRAMUSCULAR | Status: DC | PRN

## 2017-11-04 MED ORDER — LACTATED RINGERS IV SOLN
INTRAVENOUS | Status: DC
  Administered 2017-11-04: 1000 mL via INTRAVENOUS

## 2017-11-04 SURGICAL SUPPLY — 13 items
BAG URO CATCHER STRL LF (MISCELLANEOUS) ×3 IMPLANT
CATH INTERMIT  6FR 70CM (CATHETERS) ×3 IMPLANT
CLOTH BEACON ORANGE TIMEOUT ST (SAFETY) ×3 IMPLANT
COVER FOOTSWITCH UNIV (MISCELLANEOUS) IMPLANT
COVER SURGICAL LIGHT HANDLE (MISCELLANEOUS) ×3 IMPLANT
GLOVE BIOGEL M STRL SZ7.5 (GLOVE) ×3 IMPLANT
GOWN STRL REUS W/TWL LRG LVL3 (GOWN DISPOSABLE) ×6 IMPLANT
GUIDEWIRE STR DUAL SENSOR (WIRE) ×3 IMPLANT
MANIFOLD NEPTUNE II (INSTRUMENTS) ×3 IMPLANT
PACK CYSTO (CUSTOM PROCEDURE TRAY) ×3 IMPLANT
STENT URET 6FRX24 CONTOUR (STENTS) ×2 IMPLANT
TUBING CONNECTING 10 (TUBING) ×2 IMPLANT
TUBING CONNECTING 10' (TUBING) ×1

## 2017-11-04 NOTE — ED Notes (Signed)
Report given to Cindy, RN.

## 2017-11-04 NOTE — Anesthesia Preprocedure Evaluation (Signed)
Anesthesia Evaluation  Patient identified by MRN, date of birth, ID band Patient awake    Reviewed: Allergy & Precautions, NPO status , Patient's Chart, lab work & pertinent test results  History of Anesthesia Complications Negative for: history of anesthetic complications  Airway Mallampati: II  TM Distance: >3 FB Neck ROM: Full    Dental  (+) Dental Advisory Given   Pulmonary neg pulmonary ROS,    breath sounds clear to auscultation       Cardiovascular hypertension, Pt. on medications and Pt. on home beta blockers (-) angina Rhythm:Regular Rate:Normal     Neuro/Psych  Headaches, PSYCHIATRIC DISORDERS Depression    GI/Hepatic negative GI ROS, Cholelithiasis   Endo/Other  Morbid obesity  Renal/GU negative Renal ROS     Musculoskeletal   Abdominal (+) + obese,   Peds  Hematology negative hematology ROS (+)   Anesthesia Other Findings   Reproductive/Obstetrics OCPs                             Anesthesia Physical  Anesthesia Plan  ASA: II  Anesthesia Plan: General   Post-op Pain Management:    Induction: Intravenous  PONV Risk Score and Plan: 4 or greater and Scopolamine patch - Pre-op, Dexamethasone and Ondansetron  Airway Management Planned: Oral ETT  Additional Equipment:   Intra-op Plan:   Post-operative Plan: Extubation in OR  Informed Consent: I have reviewed the patients History and Physical, chart, labs and discussed the procedure including the risks, benefits and alternatives for the proposed anesthesia with the patient or authorized representative who has indicated his/her understanding and acceptance.   Dental advisory given  Plan Discussed with: CRNA and Surgeon  Anesthesia Plan Comments: (Plan routine monitors, GETA)        Anesthesia Quick Evaluation

## 2017-11-04 NOTE — ED Provider Notes (Addendum)
[facility]-EMERGENCY DEPT Provider Note   CSN: [id number] Arrival date & time: 11/04/17  1103     History   Chief Complaint Chief Complaint  Patient presents with  . Abdominal Pain    Left sided    HPI Lauren Cardenas is a 32 y.o. female.  HPI Patient presents with left-sided abdominal pain.  Has had for around 2 days.  Feels like her kidney stone.  Has had fevers up to 103 at home and is one 1.2 upon arrival.  Has a history of kidney stones.  However recently was in the hospital for complicated gallbladder disease.  Had gallbladder removed and had some recurrence of pain.  LFTs had elevated.  MRI and MRCP done for this showed a left-sided ureteral pelvis stone.  Has had vomiting.  Has seen Dr. Mena Goes from urology in the past. Past Medical History:  Diagnosis Date  . Back pain   . Chronic migraine   . Constipation   . Dysuria   . GERD (gastroesophageal reflux disease)   . History of acute pyelonephritis    09-27-2015  . History of kidney stones   . HTN (hypertension)   . Interstitial cystitis   . Neck pain   . OAB (overactive bladder)   . Obesity   . Pneumonia   . Post-op pain 10/31/2017  . Pre-diabetes   . Right ureteral stone   . Shoulder pain   . Urgency of urination   . Vesicoureteral reflux     Patient Active Problem List   Diagnosis Date Noted  . Postoperative upper abdominal pain 10/31/2017  . Prediabetes 05/30/2017  . Vitamin D deficiency 05/30/2017  . Other hyperlipidemia 05/30/2017  . Depression 05/30/2017  . Class 1 obesity with serious comorbidity and body mass index (BMI) of 31.0 to 31.9 in adult 05/30/2017  . Migraine without aura and without status migrainosus, not intractable 05/09/2017  . Other fatigue 03/13/2017  . Hyperglycemia 03/13/2017  . Class 1 obesity with serious comorbidity and body mass index (BMI) of 33.0 to 33.9 in adult 03/13/2017  . Syncope and collapse 09/27/2015  . Nephrolithiasis 09/27/2015  .  Syncope 03/20/2012    Past Surgical History:  Procedure Laterality Date  . CHOLECYSTECTOMY N/A 10/26/2017   Procedure: LAPAROSCOPIC CHOLECYSTECTOMY WITH INTRAOPERATIVE CHOLANGIOGRAM ERAS PATHWAY;  Surgeon: Harriette Bouillon, MD;  Location: [facility] OR;  Service: General;  Laterality: N/A;  . CYSTOSCOPY W/ URETERAL STENT PLACEMENT Right 09/27/2015   Procedure: CYSTOSCOPY WITH RETROGRADE PYELOGRAM/URETERAL STENT PLACEMENT;  Surgeon: Hildred Laser, MD;  Location: [facility];  Service: Urology;  Laterality: Right;  . CYSTOSCOPY WITH RETROGRADE PYELOGRAM, URETEROSCOPY AND STENT PLACEMENT Left 09/09/2014   Procedure: CYSTOSCOPY WITH LEFT RETROGRADE PYELOGRAM, URETEROSCOPY AND STONE EXTRACTION  DOUBLE J STENT PLACEMENT;  Surgeon: Jerilee Field, MD;  Location: [facility];  Service: Urology;  Laterality: Left;  . CYSTOSCOPY/URETEROSCOPY/HOLMIUM LASER/STENT PLACEMENT Right 10/16/2015   Procedure: RIGHT URETEROSCOPY/HOLMIUM LASER/STENT PLACEMENT;  Surgeon: Jerilee Field, MD;  Location: [facility];  Service: Urology;  Laterality: Right;  . HOLMIUM LASER APPLICATION Left 09/09/2014   Procedure: HOLMIUM LASER APPLICATION;  Surgeon: Jerilee Field, MD;  Location: [facility];  Service: Urology;  Laterality: Left;  . HOLMIUM LASER APPLICATION Right 10/16/2015   Procedure: HOLMIUM LASER APPLICATION;  Surgeon: Jerilee Field, MD;  Location: [facility];  Service: Urology;  Laterality: Right;  . STONE EXTRACTION WITH BASKET Right 10/16/2015   Procedure: STONE EXTRACTION WITH BASKET;  Surgeon: Jerilee Field, MD;  Location:  [facility];  Service: Urology;  Laterality: Right;  . WISDOM TOOTH EXTRACTION  03/2014     OB History    Gravida  1   Para  1   Term  1   Preterm      AB      Living  1     SAB      TAB      Ectopic      Multiple      Live Births  1            Home Medications    Prior to Admission medications   Medication Sig Start Date End  Date Taking? Authorizing Provider  acetaminophen (TYLENOL) 500 MG tablet Take 1,000 mg by mouth every 6 (six) hours as needed for mild pain.   Yes [provider]  HYDROcodone-acetaminophen (NORCO) 7.5-325 MG tablet Take 1 tablet by mouth every 6 (six) hours as needed for moderate pain.   Yes [provider]  ibuprofen (ADVIL,MOTRIN) 800 MG tablet Take 1 tablet (800 mg total) by mouth every 8 (eight) hours as needed. 10/26/17  Yes Cornett, Maisie Fus, MD  promethazine (PHENERGAN) 25 MG tablet Take 1 tablet (25 mg total) as needed by mouth for nausea or vomiting. 05/08/17  Yes Anson Fret, MD  propranolol ER (INDERAL LA) 120 MG 24 hr capsule Take 1 capsule (120 mg total) at bedtime by mouth. 05/09/17  Yes Anson Fret, MD  rizatriptan (MAXALT-MLT) 10 MG disintegrating tablet Take 1 tablet (10 mg total) 3 (three) times daily as needed by mouth for migraine. 05/09/17  Yes Anson Fret, MD  Vitamin D, Ergocalciferol, (DRISDOL) 50000 units CAPS capsule Take 1 capsule (50,000 Units total) by mouth every 14 (fourteen) days. 10/09/17  Yes Camila Li, Sahar M, PA-C  buPROPion (WELLBUTRIN SR) 150 MG 12 hr tablet Take 1 tablet (150 mg total) by mouth daily. Patient not taking: Reported on 11/04/2017 08/28/17   Demetrio Lapping, PA-C  cyclobenzaprine (FLEXERIL) 5 MG tablet Take 1 tablet (5 mg total) by mouth 3 (three) times daily as needed for muscle spasms. Patient not taking: Reported on 10/23/2017 08/04/17   Johna Sheriff, MD  EQ ALLERGY RELIEF, CETIRIZINE, 10 MG tablet TAKE 1 TABLET BY MOUTH ONCE DAILY Patient not taking: Reported on 10/23/2017 09/18/17   Elenora Gamma, MD  fluticasone [facility]) 50 MCG/ACT nasal spray Place 2 sprays as needed into both nostrils for allergies or rhinitis. Patient not taking: Reported on 10/23/2017 05/10/17   Raliegh Ip, DO  metFORMIN (GLUCOPHAGE) 500 MG tablet Take 1 tablet (500 mg total) by mouth daily with breakfast. Patient not taking: Reported on  10/31/2017 08/28/17   Demetrio Lapping, PA-C  oxyCODONE (OXY IR/ROXICODONE) 5 MG immediate release tablet Take 1 tablet (5 mg total) by mouth every 6 (six) hours as needed for severe pain. Patient not taking: Reported on 11/04/2017 10/26/17   Harriette Bouillon, MD    Family History Family History  Problem Relation Age of Onset  . Hypertension Mother   . Obesity Mother   . Diabetes Father   . Kidney disease Father   . Sudden death Father     Social History Social History   Tobacco Use  . Smoking status: Never Smoker  . Smokeless tobacco: Never Used  Substance Use Topics  . Alcohol use: No  . Drug use: No     Allergies   Sudafed [pseudoephedrine hcl] and Amoxicillin   Review of Systems  Review of Systems  Constitutional: Positive for appetite change and fever.  HENT: Negative for congestion.   Respiratory: Negative for shortness of breath.   Cardiovascular: Negative for leg swelling.  Gastrointestinal: Positive for abdominal pain.  Genitourinary: Positive for dysuria and flank pain.  Musculoskeletal: Negative for back pain.  Skin: Negative for rash.  Neurological: Negative for weakness.  Psychiatric/Behavioral: Negative for confusion.     Physical Exam Updated Vital Signs BP 126/77   Pulse (!) 102   Temp (!) 103.1 F (39.5 C) (Oral)   Resp 19   Ht  (1.575 m)   Wt 79.4 kg (175 lb)   SpO2 94%   BMI 32.01 kg/m   Physical Exam  Constitutional: She appears well-developed.  HENT:  Head: Normocephalic.  Eyes: EOM are normal.  Cardiovascular:  Tachycardia  Pulmonary/Chest: Breath sounds normal.  Abdominal:  Surgical dressing over umbilicus.  Genitourinary:  Genitourinary Comments:   CVA tenderness on left  Neurological: She is alert.  Skin: Skin is warm. Capillary refill takes less than 2 seconds.     ED Treatments / Results  Labs (all labs ordered are listed, but only abnormal results are displayed) Labs Reviewed  CBC WITH DIFFERENTIAL/PLATELET -  Abnormal; Notable for the following components:      Result Value   WBC 26.3 (*)    Neutro Abs 22.9 (*)    Monocytes Absolute 1.3 (*)    All other components within normal limits  COMPREHENSIVE METABOLIC PANEL - Abnormal; Notable for the following components:   Glucose, Bld 105 (*)    Creatinine, Ser 1.33 (*)    AST 50 (*)    ALT 170 (*)    GFR calc non Af Amer 52 (*)    All other components within normal limits  URINALYSIS, ROUTINE W REFLEX MICROSCOPIC - Abnormal; Notable for the following components:   APPearance HAZY (*)    Hgb urine dipstick MODERATE (*)    Ketones, ur 20 (*)    Protein, ur 30 (*)    Leukocytes, UA TRACE (*)    Bacteria, UA RARE (*)    All other components within normal limits  URINE CULTURE  LIPASE, BLOOD  I-STAT CG4 LACTIC ACID, ED  I-STAT CG4 LACTIC ACID, ED    EKG None  Radiology Dg Abdomen 1 View  Result Date: 11/04/2017 CLINICAL DATA:  LEFT flank pain since yesterday, chills, nausea, vomiting, aching, multiple prior kidney stones EXAM: ABDOMEN - 1 VIEW COMPARISON:  CT abdomen and pelvis 10/07/2017 FINDINGS: Surgical clips RIGHT upper quadrant likely cholecystectomy. 7 mm diameter calculus projects over expected position of the LEFT ureteropelvic junction; previously this was located at the lower pole of the LEFT kidney. No additional urinary tract calcifications identified. Prominent stool RIGHT colon through proximal transverse colon. Small bowel gas pattern unremarkable. No acute osseous findings. IMPRESSION: 7 mm diameter calculus projects over the LEFT ureteropelvic junction, or as previously this was located at the inferior pole of the LEFT kidney. Electronically Signed   By: Ulyses Southward M.D.   On: 11/04/2017 13:20   US Renal  Result Date: 11/04/2017 CLINICAL DATA:  32 year old female with left flank pain, nausea, decreased urine output. Left UPJ calculus suspected on radiographs today. EXAM: RENAL / URINARY TRACT ULTRASOUND COMPLETE COMPARISON:   Abdominal radiographs 1250 hours today. CT Abdomen and Pelvis 10/07/2017. FINDINGS: Right Kidney: Length: 12.2 centimeters. Lobulated cortex due to multifocal areas of cortical scarring redemonstrated. The residual cortical echogenicity remains normal. No hydronephrosis or right renal  lesion. Left Kidney: Length: 12.1 centimeters. Areas of upper and lower pole cortical scarring redemonstrated. Cortical echogenicity remains normal. There is enlargement of the left upper pole calyx (image 19), and while initially the left renal pelvis does not seem dilated, I suspect that the remainder of the left renal collecting system is enlarged but contains echogenic debris (image 20). Nephrolithiasis is not evident by ultrasound. Bladder: Decompressed. IMPRESSION: 1. Probable acute left hydronephrosis with echogenic debris or blood throughout the left renal collecting system. Follow-up noncontrast CT Abdomen and Pelvis would be most valuable. 2. Underlying chronic bilateral renal scarring. No right hydronephrosis. Electronically Signed   By: Odessa Fleming M.D.   On: 11/04/2017 14:37    Procedures Procedures (including critical care time)  Medications Ordered in ED Medications  sodium chloride 0.9 % bolus 500 mL (0 mLs Intravenous Stopped 11/04/17 1449)  ciprofloxacin (CIPRO) IVPB 400 mg (0 mg Intravenous Stopped 11/04/17 1449)  ondansetron (ZOFRAN) injection 4 mg (4 mg Intravenous Given 11/04/17 1304)  HYDROmorphone (DILAUDID) injection 0.5 mg (0.5 mg Intravenous Given 11/04/17 1452)  acetaminophen (TYLENOL) tablet 650 mg (650 mg Oral Given 11/04/17 1536)     Initial Impression / Assessment and Plan / ED Course  I have reviewed the triage vital signs and the nursing notes.  Pertinent labs & imaging results that were available during my care of the patient were reviewed by me and considered in my medical decision making (see chart for details).     Patient presents with left-sided flank pain and fever.  History of  kidney stones.  Reviewed recent records and patient has recent MRI showing proximal large stone.  KUB done shows proximal large stone.  White count is severely elevated at 26.  Normal lactic acid however.  Initially tachycardic.  Urinalysis shows likely infection.  Renal ultrasound done and shows hydronephrosis on the left.  This is likely infected obstructed stone and will likely require intervention by urology.  CRITICAL CARE Performed by: Benjiman Core Total critical care time: 30 minutes Critical care time was exclusive of separately billable procedures and treating other patients. Critical care was necessary to treat or prevent imminent or life-threatening deterioration. Critical care was time spent personally by me on the following activities: development of treatment plan with patient and/or surrogate as well as nursing, discussions with consultants, evaluation of patient's response to treatment, examination of patient, obtaining history from patient or surrogate, ordering and performing treatments and interventions, ordering and review of laboratory studies, ordering and review of radiographic studies, pulse oximetry and re-evaluation of patient's condition.  Discussed with Dr. Alvester Morin from urology.  He will take her to the operating room after he finishes the case he is starting now.  However he requests admission to internal medicine.   Final Clinical Impressions(s) / ED Diagnoses   Final diagnoses:  Kidney stone  Acute pyelonephritis  Sepsis, due to unspecified organism [facility])    ED Discharge Orders    None       Benjiman Core, MD 11/04/17 1534    Benjiman Core, MD 11/04/17 1550

## 2017-11-04 NOTE — H&P (Signed)
History and Physical    Lauren Cardenas ZOX:[id number] DOB: 01-16-1986 DOA: 11/04/2017  PCP: Elenora Gamma, MD   Patient coming from: Home I have personally briefly reviewed patient's old medical records in [facility]  Chief Complaint: Abdominal pain  HPI: Lauren Cardenas is a 32 y.o. female with medical history significant of pyelonephritis, nephrolithiasis, right-sided hydronephrosis and stent placement in April 2017 followed by stent removal and lithotripsy, hypertension, migraine and recent laparoscopic cholecystectomy on 10/26/2017 by Dr. Luisa Hart presented with sudden onset of left-sided abdominal pain which started suddenly yesterday.  Patient states that her abdominal pain is sharp in nature, up to 8 out of 10 in intensity, radiating to the back associated with nausea and vomiting with chills.  No aggravating or relieving factors.  Patient recently had lap scopic cholecystectomy on 10/26/2017 and postprocedure had some recurrence of pain with elevated LFTs.  She had MRI and MRCP done which had shown left-sided ureteral stone.  ED Course: Patient was found to be febrile with tachycardia and leukocytosis with left-sided hydronephrosis.  On-call urologist was contacted by the ED provider who recommended that patient will be taken to OR today for further intervention.  Intravenous fluids and antibiotics were given in the ED.  Hospitalist service was called to evaluate the patient.  Review of Systems: As per HPI otherwise 10 point review of systems negative.   Past Medical History:  Diagnosis Date  . Back pain   . Chronic migraine   . Constipation   . Dysuria   . GERD (gastroesophageal reflux disease)   . History of acute pyelonephritis    09-27-2015  . History of kidney stones   . HTN (hypertension)   . Interstitial cystitis   . Neck pain   . OAB (overactive bladder)   . Obesity   . Pneumonia   . Post-op pain 10/31/2017  . Pre-diabetes   . Right ureteral stone   .  Shoulder pain   . Urgency of urination   . Vesicoureteral reflux     Past Surgical History:  Procedure Laterality Date  . CHOLECYSTECTOMY N/A 10/26/2017   Procedure: LAPAROSCOPIC CHOLECYSTECTOMY WITH INTRAOPERATIVE CHOLANGIOGRAM ERAS PATHWAY;  Surgeon: Harriette Bouillon, MD;  Location: [facility] OR;  Service: General;  Laterality: N/A;  . CYSTOSCOPY W/ URETERAL STENT PLACEMENT Right 09/27/2015   Procedure: CYSTOSCOPY WITH RETROGRADE PYELOGRAM/URETERAL STENT PLACEMENT;  Surgeon: Hildred Laser, MD;  Location: [facility];  Service: Urology;  Laterality: Right;  . CYSTOSCOPY WITH RETROGRADE PYELOGRAM, URETEROSCOPY AND STENT PLACEMENT Left 09/09/2014   Procedure: CYSTOSCOPY WITH LEFT RETROGRADE PYELOGRAM, URETEROSCOPY AND STONE EXTRACTION  DOUBLE J STENT PLACEMENT;  Surgeon: Jerilee Field, MD;  Location: [facility];  Service: Urology;  Laterality: Left;  . CYSTOSCOPY/URETEROSCOPY/HOLMIUM LASER/STENT PLACEMENT Right 10/16/2015   Procedure: RIGHT URETEROSCOPY/HOLMIUM LASER/STENT PLACEMENT;  Surgeon: Jerilee Field, MD;  Location: [facility];  Service: Urology;  Laterality: Right;  . HOLMIUM LASER APPLICATION Left 09/09/2014   Procedure: HOLMIUM LASER APPLICATION;  Surgeon: Jerilee Field, MD;  Location: [facility];  Service: Urology;  Laterality: Left;  . HOLMIUM LASER APPLICATION Right 10/16/2015   Procedure: HOLMIUM LASER APPLICATION;  Surgeon: Jerilee Field, MD;  Location: [facility];  Service: Urology;  Laterality: Right;  . STONE EXTRACTION WITH BASKET Right 10/16/2015   Procedure: STONE EXTRACTION WITH BASKET;  Surgeon: Jerilee Field, MD;  Location: [facility];  Service: Urology;  Laterality: Right;  . WISDOM TOOTH EXTRACTION  03/2014   Family history  reports  that she has never smoked. She has never used smokeless tobacco. She reports that she does not drink alcohol or use drugs.  Lives at home with husband.  Allergies  Allergen Reactions  . Sudafed  [Pseudoephedrine Hcl] Shortness Of Breath  . Amoxicillin Hives    Has patient had a PCN reaction causing immediate rash, facial/tongue/throat swelling, SOB or lightheadedness with hypotension: Yes Has patient had a PCN reaction causing severe rash involving mucus membranes or skin necrosis: No Has patient had a PCN reaction that required hospitalization: No Has patient had a PCN reaction occurring within the last 10 years: Yes If all of the above answers are "NO", then may proceed with Cephalosporin use.     Family History  Problem Relation Age of Onset  . Hypertension Mother   . Obesity Mother   . Diabetes Father   . Kidney disease Father   . Sudden death Father     Prior to Admission medications   Medication Sig Start Date End Date Taking? Authorizing Provider  acetaminophen (TYLENOL) 500 MG tablet Take 1,000 mg by mouth every 6 (six) hours as needed for mild pain.   Yes [provider]  HYDROcodone-acetaminophen (NORCO) 7.5-325 MG tablet Take 1 tablet by mouth every 6 (six) hours as needed for moderate pain.   Yes [provider]  ibuprofen (ADVIL,MOTRIN) 800 MG tablet Take 1 tablet (800 mg total) by mouth every 8 (eight) hours as needed. 10/26/17  Yes Cornett, Maisie Fus, MD  promethazine (PHENERGAN) 25 MG tablet Take 1 tablet (25 mg total) as needed by mouth for nausea or vomiting. 05/08/17  Yes Anson Fret, MD  propranolol ER (INDERAL LA) 120 MG 24 hr capsule Take 1 capsule (120 mg total) at bedtime by mouth. 05/09/17  Yes Anson Fret, MD  rizatriptan (MAXALT-MLT) 10 MG disintegrating tablet Take 1 tablet (10 mg total) 3 (three) times daily as needed by mouth for migraine. 05/09/17  Yes Anson Fret, MD  Vitamin D, Ergocalciferol, (DRISDOL) 50000 units CAPS capsule Take 1 capsule (50,000 Units total) by mouth every 14 (fourteen) days. 10/09/17  Yes Camila Li, Sahar M, PA-C  buPROPion (WELLBUTRIN SR) 150 MG 12 hr tablet Take 1 tablet (150 mg total) by mouth  daily. Patient not taking: Reported on 11/04/2017 08/28/17   Demetrio Lapping, PA-C  cyclobenzaprine (FLEXERIL) 5 MG tablet Take 1 tablet (5 mg total) by mouth 3 (three) times daily as needed for muscle spasms. Patient not taking: Reported on 10/23/2017 08/04/17   Johna Sheriff, MD  EQ ALLERGY RELIEF, CETIRIZINE, 10 MG tablet TAKE 1 TABLET BY MOUTH ONCE DAILY Patient not taking: Reported on 10/23/2017 09/18/17   Elenora Gamma, MD  fluticasone [facility]) 50 MCG/ACT nasal spray Place 2 sprays as needed into both nostrils for allergies or rhinitis. Patient not taking: Reported on 10/23/2017 05/10/17   Raliegh Ip, DO  metFORMIN (GLUCOPHAGE) 500 MG tablet Take 1 tablet (500 mg total) by mouth daily with breakfast. Patient not taking: Reported on 10/31/2017 08/28/17   Demetrio Lapping, PA-C  oxyCODONE (OXY IR/ROXICODONE) 5 MG immediate release tablet Take 1 tablet (5 mg total) by mouth every 6 (six) hours as needed for severe pain. Patient not taking: Reported on 11/04/2017 10/26/17   Harriette Bouillon, MD    Physical Exam: Vitals:   11/04/17 1500 11/04/17 1528 11/04/17 1530 11/04/17 1633  BP: 126/77  134/80 117/77  Pulse: (!) 102  (!) 104 (!) 107  Resp: 19  17 18  Temp:  (!) 103.1 F (39.5 C)    TempSrc:  Oral    SpO2: 94%  96% 95%  Weight:      Height:        Constitutional: NAD, calm, comfortable Vitals:   11/04/17 1500 11/04/17 1528 11/04/17 1530 11/04/17 1633  BP: 126/77  134/80 117/77  Pulse: (!) 102  (!) 104 (!) 107  Resp: 19  17 18   Temp:  (!) 103.1 F (39.5 C)    TempSrc:  Oral    SpO2: 94%  96% 95%  Weight:      Height:       Eyes: PERRL, lids and conjunctivae normal ENMT: Mucous membranes are dry. Posterior pharynx clear of any exudate Neck: normal, supple, no masses, no thyromegaly Respiratory: Bilateral decreased breath sounds at bases, no wheezing, no crackles. Normal respiratory effort. No accessory muscle use.  Cardiovascular: S1-S2 heard, tachycardic, no murmurs. No  extremity edema.  Abdomen: no tenderness, no masses palpated. No hepatosplenomegaly. Bowel sounds positive.  Currently no CVA tenderness.   Musculoskeletal: no clubbing / cyanosis. No joint deformity upper and lower extremities.  Skin: no rashes, lesions, ulcers. No induration Neurologic: CN 2-12 grossly intact.  Awake and oriented.  Moving extremities.  No focal neurologic deficit Psychiatric: Normal judgment and insight.  Normal mood.    Labs on Admission: I have personally reviewed following labs and imaging studies  CBC: Recent Labs  Lab 10/31/17 1758 11/01/17 0250 11/01/17 1341 11/04/17 1242  WBC 19.7* 12.8* 8.5 26.3*  NEUTROABS 16.4*  --   --  22.9*  HGB 14.1 12.1 13.0 12.9  HCT 43.4 37.5 39.8 40.2  MCV 82.5 82.2 83.3 84.3  PLT 270 230 223 232   Basic Metabolic Panel: Recent Labs  Lab 10/31/17 1758 11/01/17 0250 11/04/17 1242  NA 142 139 140  K 4.1 3.7 3.7  CL 106 109 106  CO2 25 22 24   GLUCOSE 161* 126* 105*  BUN 13 7 14   CREATININE 0.77 0.64 1.33*  CALCIUM 9.5 8.2* 9.0   GFR: Estimated Creatinine Clearance: 58.7 mL/min (A) (by C-G formula based on SCr of 1.33 mg/dL (H)). Liver Function Tests: Recent Labs  Lab 10/31/17 1912 11/01/17 0250 11/02/17 0549 11/04/17 1242  AST 144* 336* 207* 50*  ALT 79* 240* 378* 170*  ALKPHOS 105 109 138* 106  BILITOT 1.5* 1.9* 1.2 0.9  PROT 7.3 6.3* 6.5 7.3  ALBUMIN 3.8 3.0* 3.1* 3.6   Recent Labs  Lab 10/31/17 1912 11/04/17 1242  LIPASE 33 23   No results for input(s): AMMONIA in the last 168 hours. Coagulation Profile: No results for input(s): INR, PROTIME in the last 168 hours. Cardiac Enzymes: No results for input(s): CKTOTAL, CKMB, CKMBINDEX, TROPONINI in the last 168 hours. BNP (last 3 results) No results for input(s): PROBNP in the last 8760 hours. HbA1C: No results for input(s): HGBA1C in the last 72 hours. CBG: No results for input(s): GLUCAP in the last 168 hours. Lipid Profile: No results for  input(s): CHOL, HDL, LDLCALC, TRIG, CHOLHDL, LDLDIRECT in the last 72 hours. Thyroid Function Tests: No results for input(s): TSH, T4TOTAL, FREET4, T3FREE, THYROIDAB in the last 72 hours. Anemia Panel: No results for input(s): VITAMINB12, FOLATE, FERRITIN, TIBC, IRON, RETICCTPCT in the last 72 hours. Urine analysis:    Component Value Date/Time   COLORURINE YELLOW 11/04/2017 1159   APPEARANCEUR HAZY (A) 11/04/2017 1159   APPEARANCEUR Hazy (A) 10/06/2017 0900   LABSPEC 1.025 11/04/2017 1159   PHURINE 5.0 11/04/2017  1159   GLUCOSEU NEGATIVE 11/04/2017 1159   HGBUR MODERATE (A) 11/04/2017 1159   BILIRUBINUR NEGATIVE 11/04/2017 1159   BILIRUBINUR Positive (A) 10/06/2017 0900   KETONESUR 20 (A) 11/04/2017 1159   PROTEINUR 30 (A) 11/04/2017 1159   UROBILINOGEN negative 06/12/2014 1536   UROBILINOGEN 1.0 03/20/2012 2124   NITRITE NEGATIVE 11/04/2017 1159   LEUKOCYTESUR TRACE (A) 11/04/2017 1159   LEUKOCYTESUR 1+ (A) 10/06/2017 0900    Radiological Exams on Admission: Dg Abdomen 1 View  Result Date: 11/04/2017 CLINICAL DATA:  LEFT flank pain since yesterday, chills, nausea, vomiting, aching, multiple prior kidney stones EXAM: ABDOMEN - 1 VIEW COMPARISON:  CT abdomen and pelvis 10/07/2017 FINDINGS: Surgical clips RIGHT upper quadrant likely cholecystectomy. 7 mm diameter calculus projects over expected position of the LEFT ureteropelvic junction; previously this was located at the lower pole of the LEFT kidney. No additional urinary tract calcifications identified. Prominent stool RIGHT colon through proximal transverse colon. Small bowel gas pattern unremarkable. No acute osseous findings. IMPRESSION: 7 mm diameter calculus projects over the LEFT ureteropelvic junction, or as previously this was located at the inferior pole of the LEFT kidney. Electronically Signed   By: Ulyses Southward M.D.   On: 11/04/2017 13:20   US Renal  Result Date: 11/04/2017 CLINICAL DATA:  32 year old female with left  flank pain, nausea, decreased urine output. Left UPJ calculus suspected on radiographs today. EXAM: RENAL / URINARY TRACT ULTRASOUND COMPLETE COMPARISON:  Abdominal radiographs 1250 hours today. CT Abdomen and Pelvis 10/07/2017. FINDINGS: Right Kidney: Length: 12.2 centimeters. Lobulated cortex due to multifocal areas of cortical scarring redemonstrated. The residual cortical echogenicity remains normal. No hydronephrosis or right renal lesion. Left Kidney: Length: 12.1 centimeters. Areas of upper and lower pole cortical scarring redemonstrated. Cortical echogenicity remains normal. There is enlargement of the left upper pole calyx (image 19), and while initially the left renal pelvis does not seem dilated, I suspect that the remainder of the left renal collecting system is enlarged but contains echogenic debris (image 20). Nephrolithiasis is not evident by ultrasound. Bladder: Decompressed. IMPRESSION: 1. Probable acute left hydronephrosis with echogenic debris or blood throughout the left renal collecting system. Follow-up noncontrast CT Abdomen and Pelvis would be most valuable. 2. Underlying chronic bilateral renal scarring. No right hydronephrosis. Electronically Signed   By: Odessa Fleming M.D.   On: 11/04/2017 14:37     Assessment/Plan Active Problems:   Acute pyelonephritis   Sepsis (HCC)   Leukocytosis   Ureterolithiasis   Pyelonephritis   Sepsis secondary to acute pyelonephritis -Patient has been started on intravenous ciprofloxacin and IV fluids in the ED.  Continue normal saline at 125 cc an hour.  Continue ciprofloxacin.  Patient is allergic to penicillin.  Follow cultures  Acute pyelonephritis with left-sided hydronephrosis and left ureteral stone in a patient with history of nephrolithiasis and hydronephrosis in the past -Patient had a recent MRI/MRCP of the abdomen which had shown left ureteral stone -Dr. Bell/urology has been consulted by the ED provider, he plans to take the patient to  OR today. -N.p.o. for now -Pain management.  Antiemetics  Leukocytosis -Probably secondary to above.  Repeat a.m. labs  Acute kidney injury -Probably secondary to above.  Repeat a.m. labs.  IV fluids as above  Elevated LFTs -Patient recently had a laparoscopic cholecystectomy and her LFTs are improving compared to recent LFTs.  Repeat a.m. labs.  Outpatient follow-up with general surgery -She had a negative HIDA scan on 11/01/2017 with no evidence of bile leak  Hypertension -Continue propranolol.  Monitor blood pressure  Chronic migraine -Continue rizatriptan as needed   DVT prophylaxis: SCDs.  Early ambulation Code Status: Full Family Communication: Husband at bedside Disposition Plan: Depends on clinical outcome Consults called: Urology by ED provider Admission status: MedSurg  Severity of Illness: The appropriate patient status for this patient is INPATIENT. Inpatient status is judged to be reasonable and necessary in order to provide the required intensity of service to ensure the patient's safety. The patient's presenting symptoms, physical exam findings, and initial radiographic and laboratory data in the context of their chronic comorbidities is felt to place them at high risk for further clinical deterioration. Furthermore, it is not anticipated that the patient will be medically stable for discharge from the hospital within 2 midnights of admission. The following factors support the patient status of inpatient.   " The patient's presenting symptoms include nausea, vomiting and abdominal pain. " The worrisome physical exam findings include fever, tachycardia " The initial radiographic and laboratory data are worrisome because of left-sided hydronephrosis, leukocytosis. " The chronic co-morbidities include nephrolithiasis/history of hydronephrosis.   * I certify that at the point of admission it is my clinical judgment that the patient will require inpatient hospital care  spanning beyond 2 midnights from the point of admission due to high intensity of service, high risk for further deterioration and high frequency of surveillance required.Glade Lloyd MD [facility] Pager [phone]  If 7PM-7AM, please contact night-coverage [email or web address] Password [facility]  11/04/2017, 4:43 PM

## 2017-11-04 NOTE — Progress Notes (Signed)
ANTIBIOTIC CONSULT NOTE - INITIAL  Pharmacy Consult for Cipro Indication: Pyelonephritis  Allergies  Allergen Reactions  . Sudafed [Pseudoephedrine Hcl] Shortness Of Breath  . Amoxicillin Hives    Has patient had a PCN reaction causing immediate rash, facial/tongue/throat swelling, SOB or lightheadedness with hypotension: Yes Has patient had a PCN reaction causing severe rash involving mucus membranes or skin necrosis: No Has patient had a PCN reaction that required hospitalization: No Has patient had a PCN reaction occurring within the last 10 years: Yes If all of the above answers are "NO", then may proceed with Cephalosporin use.     Patient Measurements: Height:  (157.5 cm) Weight: 175 lb (79.4 kg) IBW/kg (Calculated) : 50.1  Vital Signs: Temp: 103.1 F (39.5 C) (05/11 1528) Temp Source: Oral (05/11 1528) BP: 117/77 (05/11 1633) Pulse Rate: 107 (05/11 1633) Intake/Output from previous day: No intake/output data recorded. Intake/Output from this shift: Total I/O In: 1083.3 [IV Piggyback:1083.3] Out: -   Labs: Recent Labs    11/04/17 1242  WBC 26.3*  HGB 12.9  PLT 232  CREATININE 1.33*   Estimated Creatinine Clearance: 58.7 mL/min (A) (by C-G formula based on SCr of 1.33 mg/dL (H)). No results for input(s): VANCOTROUGH, VANCOPEAK, VANCORANDOM, GENTTROUGH, GENTPEAK, GENTRANDOM, TOBRATROUGH, TOBRAPEAK, TOBRARND, AMIKACINPEAK, AMIKACINTROU, AMIKACIN in the last 72 hours.   Microbiology: Recent Results (from the past 720 hour(s))  Microscopic Examination     Status: Abnormal   Collection Time: 10/06/17  9:00 AM  Result Value Ref Range Status   WBC, UA 6-10 (A) 0 - 5 /hpf Final   RBC, UA 0-2 0 - 2 /hpf Final   Epithelial Cells (non renal) >10 (A) 0 - 10 /hpf Final   Renal Epithel, UA None seen None seen /hpf Final   Mucus, UA Present Not Estab. Final   Bacteria, UA Many (A) None seen/Few Final  Urine Culture     Status: None   Collection Time: 10/06/17   5:20 PM  Result Value Ref Range Status   Urine Culture, Routine Final report  Final   Organism ID, Bacteria Comment  Final    Comment: Mixed urogenital flora 10,000-25,000 colony forming units per mL     Medical History: Past Medical History:  Diagnosis Date  . Back pain   . Chronic migraine   . Constipation   . Dysuria   . GERD (gastroesophageal reflux disease)   . History of acute pyelonephritis    09-27-2015  . History of kidney stones   . HTN (hypertension)   . Interstitial cystitis   . Neck pain   . OAB (overactive bladder)   . Obesity   . Pneumonia   . Post-op pain 10/31/2017  . Pre-diabetes   . Right ureteral stone   . Shoulder pain   . Urgency of urination   . Vesicoureteral reflux     Medications:  Anti-infectives (From admission, onward)   Start     Dose/Rate Route Frequency Ordered Stop   11/04/17 1215  ciprofloxacin (CIPRO) IVPB 400 mg     400 mg 200 mL/hr over 60 Minutes Intravenous  Once 11/04/17 1201 11/04/17 1449     Assessment: 31yo F w/ left-sided abdominal pain. Pharmacy is asked to dose Cipro. First dose given in the ED.  Goal of Therapy:  Appropriate therapy  Plan:  Start Cipro  IV q12h. Pharmacy will sign-off. Please re-consult if renal function changes.   Charolotte Eke, PharmD. Mobile: [phone]. 11/04/2017,4:50 PM.

## 2017-11-04 NOTE — ED Notes (Signed)
ED TO INPATIENT HANDOFF REPORT  Name/Age/Gender Lauren Cardenas 32 y.o. female  Code Status    Code Status Orders  (From admission, onward)        Start     Ordered   11/04/17 1640  Full code  Continuous     11/04/17 1643    Code Status History    Date Active Date Inactive Code Status Order ID Comments User Context   11/01/2017 0049 11/02/2017 1426 Full Code [id number]  Donnie Mesa, MD ED      Home/SNF/Other Home  Chief Complaint possible kidneystone  Level of Care/Admitting Diagnosis ED Disposition    ED Disposition Condition [place]: [facility] [[id number]]  Level of Care: Med-Surg [16]  Diagnosis: Pyelonephritis [902409]  Admitting Physician: Aline August [[id number]]  Attending Physician: Aline August [[id number]]  Estimated length of stay: 3 - 4 days  Certification:: I certify this patient will need inpatient services for at least 2 midnights  PT Class (Do Not Modify): Inpatient [101]  PT Acc Code (Do Not Modify): Private [1]       Medical History Past Medical History:  Diagnosis Date  . Back pain   . Chronic migraine   . Constipation   . Dysuria   . GERD (gastroesophageal reflux disease)   . History of acute pyelonephritis    09-27-2015  . History of kidney stones   . HTN (hypertension)   . Interstitial cystitis   . Neck pain   . OAB (overactive bladder)   . Obesity   . Pneumonia   . Post-op pain 10/31/2017  . Pre-diabetes   . Right ureteral stone   . Shoulder pain   . Urgency of urination   . Vesicoureteral reflux     Allergies Allergies  Allergen Reactions  . Sudafed [Pseudoephedrine Hcl] Shortness Of Breath  . Amoxicillin Hives    Has patient had a PCN reaction causing immediate rash, facial/tongue/throat swelling, SOB or lightheadedness with hypotension: Yes Has patient had a PCN reaction causing severe rash involving mucus membranes or skin necrosis: No Has patient had a PCN reaction that  required hospitalization: No Has patient had a PCN reaction occurring within the last 10 years: Yes If all of the above answers are "NO", then may proceed with Cephalosporin use.     IV Location/Drains/Wounds Patient Lines/Drains/Airways Status   Active Line/Drains/Airways    Name:   Placement date:   Placement time:   Site:   Days:   Peripheral IV 11/04/17 Left Antecubital   11/04/17    1243    Antecubital   less than 1   Ureteral Drain/Stent Right ureter 6 Fr.   10/16/15    -    Right ureter   750   Incision (Closed) 09/27/15 Other (Comment) Other (Comment)   09/27/15    1919     769   Incision (Closed) 10/16/15 Perineum Right   10/16/15    0830     750   Incision (Closed) 10/26/17 Abdomen Other (Comment)   10/26/17    1633     9          Labs/Imaging Results for orders placed or performed during the hospital encounter of 11/04/17 (from the past 48 hour(s))  Urinalysis, Routine w reflex microscopic     Status: Abnormal   Collection Time: 11/04/17 11:59 AM  Result Value Ref Range   Color, Urine YELLOW YELLOW   APPearance HAZY (A) CLEAR  Specific Gravity, Urine 1.025 1.005 - 1.030   pH 5.0 5.0 - 8.0   Glucose, UA NEGATIVE NEGATIVE mg/dL   Hgb urine dipstick MODERATE (A) NEGATIVE   Bilirubin Urine NEGATIVE NEGATIVE   Ketones, ur 20 (A) NEGATIVE mg/dL   Protein, ur 30 (A) NEGATIVE mg/dL   Nitrite NEGATIVE NEGATIVE   Leukocytes, UA TRACE (A) NEGATIVE   RBC / HPF 21-50 0 - 5 RBC/hpf   WBC, UA 11-20 0 - 5 WBC/hpf   Bacteria, UA RARE (A) NONE SEEN   Squamous Epithelial / LPF 6-10 0 - 5   Mucus PRESENT     Comment: Performed at [facility], [place] [address] [place], [place] [address]  I-Stat CG4 Lactic Acid, ED     Status: None   Collection Time: 11/04/17 12:41 PM  Result Value Ref Range   Lactic Acid, Venous 1.44 0.5 - 1.9 mmol/L  CBC with Differential     Status: Abnormal   Collection Time: 11/04/17 12:42 PM  Result Value Ref Range   WBC 26.3 (H) 4.0 -  10.5 K/uL   RBC 4.77 3.87 - 5.11 MIL/uL   Hemoglobin 12.9 12.0 - 15.0 g/dL   HCT 40.2 36.0 - 46.0 %   MCV 84.3 78.0 - 100.0 fL   MCH 27.0 26.0 - 34.0 pg   MCHC 32.1 30.0 - 36.0 g/dL   RDW 13.8 11.5 - 15.5 %   Platelets 232 150 - 400 K/uL   Neutrophils Relative % 87 %   Lymphocytes Relative 8 %   Monocytes Relative 5 %   Eosinophils Relative 0 %   Basophils Relative 0 %   Neutro Abs 22.9 (H) 1.7 - 7.7 K/uL   Lymphs Abs 2.1 0.7 - 4.0 K/uL   Monocytes Absolute 1.3 (H) 0.1 - 1.0 K/uL   Eosinophils Absolute 0.0 0.0 - 0.7 K/uL   Basophils Absolute 0.0 0.0 - 0.1 K/uL   RBC Morphology POLYCHROMASIA PRESENT     Comment: STOMATOCYTES   WBC Morphology INCREASED BANDS (>20% BANDS)     Comment: Performed at [facility], [place] [address], [place], [place] [address]  Comprehensive metabolic panel     Status: Abnormal   Collection Time: 11/04/17 12:42 PM  Result Value Ref Range   Sodium 140 135 - 145 mmol/L   Potassium 3.7 3.5 - 5.1 mmol/L   Chloride 106 101 - 111 mmol/L   CO2 24 22 - 32 mmol/L   Glucose, Bld 105 (H) 65 - 99 mg/dL   BUN 14 6 - 20 mg/dL   Creatinine, Ser 1.33 (H) 0.44 - 1.00 mg/dL   Calcium 9.0 8.9 - 10.3 mg/dL   Total Protein 7.3 6.5 - 8.1 g/dL   Albumin 3.6 3.5 - 5.0 g/dL   AST 50 (H) 15 - 41 U/L   ALT 170 (H) 14 - 54 U/L   Alkaline Phosphatase 106 38 - 126 U/L   Total Bilirubin 0.9 0.3 - 1.2 mg/dL   GFR calc non Af Amer 52 (L) >60 mL/min   GFR calc Af Amer >60 >60 mL/min    Comment: (NOTE) The eGFR has been calculated using the CKD EPI equation. This calculation has not been validated in all clinical situations. eGFR's persistently <60 mL/min signify possible Chronic Kidney Disease.    Anion gap 10 5 - 15    Comment: Performed at [facility], [place] [address], [place], [place] [address]  Lipase, blood     Status: None  Collection Time: 11/04/17 12:42 PM  Result Value Ref Range   Lipase 23 11 - 51 U/L    Comment: Performed  at [facility], [place] [address] [place], [place] [address]  I-Stat CG4 Lactic Acid, ED     Status: None   Collection Time: 11/04/17  3:02 PM  Result Value Ref Range   Lactic Acid, Venous 1.40 0.5 - 1.9 mmol/L   Dg Abdomen 1 View  Result Date: 11/04/2017 CLINICAL DATA:  LEFT flank pain since yesterday, chills, nausea, vomiting, aching, multiple prior kidney stones EXAM: ABDOMEN - 1 VIEW COMPARISON:  CT abdomen and pelvis 10/07/2017 FINDINGS: Surgical clips RIGHT upper quadrant likely cholecystectomy. 7 mm diameter calculus projects over expected position of the LEFT ureteropelvic junction; previously this was located at the lower pole of the LEFT kidney. No additional urinary tract calcifications identified. Prominent stool RIGHT colon through proximal transverse colon. Small bowel gas pattern unremarkable. No acute osseous findings. IMPRESSION: 7 mm diameter calculus projects over the LEFT ureteropelvic junction, or as previously this was located at the inferior pole of the LEFT kidney. Electronically Signed   By: Lavonia Dana M.D.   On: 11/04/2017 13:20   US Renal  Result Date: 11/04/2017 CLINICAL DATA:  32 year old female with left flank pain, nausea, decreased urine output. Left UPJ calculus suspected on radiographs today. EXAM: RENAL / URINARY TRACT ULTRASOUND COMPLETE COMPARISON:  Abdominal radiographs 1250 hours today. CT Abdomen and Pelvis 10/07/2017. FINDINGS: Right Kidney: Length: 12.2 centimeters. Lobulated cortex due to multifocal areas of cortical scarring redemonstrated. The residual cortical echogenicity remains normal. No hydronephrosis or right renal lesion. Left Kidney: Length: 12.1 centimeters. Areas of upper and lower pole cortical scarring redemonstrated. Cortical echogenicity remains normal. There is enlargement of the left upper pole calyx (image 19), and while initially the left renal pelvis does not seem dilated, I suspect that the remainder of the left renal  collecting system is enlarged but contains echogenic debris (image 20). Nephrolithiasis is not evident by ultrasound. Bladder: Decompressed. IMPRESSION: 1. Probable acute left hydronephrosis with echogenic debris or blood throughout the left renal collecting system. Follow-up noncontrast CT Abdomen and Pelvis would be most valuable. 2. Underlying chronic bilateral renal scarring. No right hydronephrosis. Electronically Signed   By: Genevie Ann M.D.   On: 11/04/2017 14:37    Pending Labs Unresulted Labs (From admission, onward)   Start     Ordered   11/05/17 0500  CBC  Tomorrow morning,   R     11/04/17 1643   11/05/17 0500  Comprehensive metabolic panel  Tomorrow morning,   R     11/04/17 1643   11/04/17 1202  Urine culture  STAT,   STAT     11/04/17 1201      Vitals/Pain Today's Vitals   11/04/17 1529 11/04/17 1530 11/04/17 1609 11/04/17 1633  BP:  134/80  117/77  Pulse:  (!) 104  (!) 107  Resp:  17  18  Temp:      TempSrc:      SpO2:  96%  95%  Weight:      Height:      PainSc: 0-No pain  0-No pain     Isolation Precautions No active isolations  Medications Medications  HYDROcodone-acetaminophen (NORCO) 7.5-325 MG per tablet 1 tablet (has no administration in time range)  propranolol ER (INDERAL LA) 24 hr capsule 120 mg (has no administration in time range)  rizatriptan (MAXALT-MLT) disintegrating tablet 10 mg (has no administration in  time range)  0.9 %  sodium chloride infusion (has no administration in time range)  acetaminophen (TYLENOL) tablet 650 mg (has no administration in time range)    Or  acetaminophen (TYLENOL) suppository 650 mg (has no administration in time range)  ketorolac (TORADOL) 30 MG/ML injection 30 mg (has no administration in time range)  senna-docusate (Senokot-S) tablet 1 tablet (has no administration in time range)  ondansetron (ZOFRAN) tablet 4 mg (has no administration in time range)    Or  ondansetron (ZOFRAN) injection 4 mg (has no  administration in time range)  ciprofloxacin (CIPRO) IVPB 400 mg (has no administration in time range)  sodium chloride 0.9 % bolus 500 mL (0 mLs Intravenous Stopped 11/04/17 1449)  ciprofloxacin (CIPRO) IVPB 400 mg (0 mg Intravenous Stopped 11/04/17 1449)  ondansetron (ZOFRAN) injection 4 mg (4 mg Intravenous Given 11/04/17 1304)  HYDROmorphone (DILAUDID) injection 0.5 mg (0.5 mg Intravenous Given 11/04/17 1452)  acetaminophen (TYLENOL) tablet 650 mg (650 mg Oral Given 11/04/17 1536)    Mobility walks with person assist

## 2017-11-04 NOTE — Progress Notes (Signed)
Pt received from CRNA at 2109

## 2017-11-04 NOTE — Transfer of Care (Signed)
Immediate Anesthesia Transfer of Care Note  Patient: Lauren Cardenas  Procedure(s) Performed: Procedure(s): CYSTOSCOPY WITH RETROGRADE AND LEFT STENT PLACEMENT (Left)  Patient Location: PACU  Anesthesia Type:General  Level of Consciousness:  sedated, patient cooperative and responds to stimulation  Airway & Oxygen Therapy:Patient Spontanous Breathing and Patient connected to face mask oxgen  Post-op Assessment:  Report given to PACU RN and Post -op Vital signs reviewed and stable  Post vital signs:  Reviewed and stable  Last Vitals:  Vitals:   11/04/17 1736 11/04/17 2045  BP: 119/79 110/70  Pulse: (!) 103   Resp: 16 (!) 22  Temp: (!) 39.4 C (!) 40.1 C  SpO2: 98% 100%    Complications: No apparent anesthesia complications

## 2017-11-04 NOTE — H&P (Signed)
Urology Consult   Physician requesting consult: Dr. Hanley Ben  Reason for consult: Left ureteral stone and sepsis  History of Present Illness: Lauren Cardenas is a 32 y.o. with a history of urolithiasis followed by Dr. Mena Goes.  She presented today with a one day history of left flank pain and had a known left renal calculus about one month ago on imaging. She developed fever to 103 today.  She has had nausea.  She denies hematuria.  [facility] imaging and renal ultrasound today confirmed the previously noted stone to appear at the left UPJ measuring 7 mm.  She also had hydronephrosis.    Past Medical History:  Diagnosis Date  . Back pain   . Chronic migraine   . Constipation   . Dysuria   . GERD (gastroesophageal reflux disease)   . History of acute pyelonephritis    09-27-2015  . History of kidney stones   . HTN (hypertension)   . Interstitial cystitis   . Neck pain   . OAB (overactive bladder)   . Obesity   . Pneumonia   . Post-op pain 10/31/2017  . Pre-diabetes   . Right ureteral stone   . Shoulder pain   . Urgency of urination   . Vesicoureteral reflux     Past Surgical History:  Procedure Laterality Date  . CHOLECYSTECTOMY N/A 10/26/2017   Procedure: LAPAROSCOPIC CHOLECYSTECTOMY WITH INTRAOPERATIVE CHOLANGIOGRAM ERAS PATHWAY;  Surgeon: Harriette Bouillon, MD;  Location: [facility] OR;  Service: General;  Laterality: N/A;  . CYSTOSCOPY W/ URETERAL STENT PLACEMENT Right 09/27/2015   Procedure: CYSTOSCOPY WITH RETROGRADE PYELOGRAM/URETERAL STENT PLACEMENT;  Surgeon: Hildred Laser, MD;  Location: [facility];  Service: Urology;  Laterality: Right;  . CYSTOSCOPY WITH RETROGRADE PYELOGRAM, URETEROSCOPY AND STENT PLACEMENT Left 09/09/2014   Procedure: CYSTOSCOPY WITH LEFT RETROGRADE PYELOGRAM, URETEROSCOPY AND STONE EXTRACTION  DOUBLE J STENT PLACEMENT;  Surgeon: Jerilee Field, MD;  Location: [facility];  Service: Urology;  Laterality: Left;  . CYSTOSCOPY/URETEROSCOPY/HOLMIUM LASER/STENT PLACEMENT  Right 10/16/2015   Procedure: RIGHT URETEROSCOPY/HOLMIUM LASER/STENT PLACEMENT;  Surgeon: Jerilee Field, MD;  [facility];  Service: Urology;  Laterality: Right;  . HOLMIUM LASER APPLICATION Left 09/09/2014   Procedure: HOLMIUM LASER APPLICATION;  Surgeon: Jerilee Field, MD;  Location: [facility];  Service: Urology;  Laterality: Left;  . HOLMIUM LASER APPLICATION Right 10/16/2015   Procedure: HOLMIUM LASER APPLICATION;  Surgeon: Jerilee Field, MD;  Location: [facility];  Service: Urology;  Laterality: Right;  . STONE EXTRACTION WITH BASKET Right 10/16/2015   Procedure: STONE EXTRACTION WITH BASKET;  Surgeon: Jerilee Field, MD;  Location: [facility];  Service: Urology;  Laterality: Right;  . WISDOM TOOTH EXTRACTION  03/2014     Current Hospital Medications:  Home meds:  Current Meds  Medication Sig  . acetaminophen (TYLENOL) 500 MG tablet Take 1,000 mg by mouth every 6 (six) hours as needed for mild pain.  Marland Kitchen HYDROcodone-acetaminophen (NORCO) 7.5-325 MG tablet Take 1 tablet by mouth every 6 (six) hours as needed for moderate pain.  Marland Kitchen ibuprofen (ADVIL,MOTRIN) 800 MG tablet Take 1 tablet (800 mg total) by mouth every 8 (eight) hours as needed.  . promethazine (PHENERGAN) 25 MG tablet Take 1 tablet (25 mg total) as needed by mouth for nausea or vomiting.  . propranolol ER (INDERAL LA) 120 MG 24 hr capsule Take 1 capsule (120 mg total) at bedtime by mouth.  . rizatriptan (MAXALT-MLT) 10 MG disintegrating tablet Take 1 tablet (10 mg total) 3 (  three) times daily as needed by mouth for migraine.  . Vitamin D, Ergocalciferol, (DRISDOL) 50000 units CAPS capsule Take 1 capsule (50,000 Units total) by mouth every 14 (fourteen) days.    Scheduled Meds: . [MAR Hold] propranolol ER  120 mg Oral QHS   Continuous Infusions: . sodium chloride 125 mL/hr at 11/04/17 1815  . [MAR Hold] ciprofloxacin     PRN Meds:.[MAR Hold] acetaminophen **OR**  [MAR Hold] acetaminophen, [MAR Hold] HYDROcodone-acetaminophen, [MAR Hold] ketorolac, [MAR Hold] ondansetron **OR** [MAR Hold] ondansetron (ZOFRAN) IV, [MAR Hold] senna-docusate, [MAR Hold] SUMAtriptan  Allergies:  Allergies  Allergen Reactions  . Sudafed [Pseudoephedrine Hcl] Shortness Of Breath  . Amoxicillin Hives    Has patient had a PCN reaction causing immediate rash, facial/tongue/throat swelling, SOB or lightheadedness with hypotension: Yes Has patient had a PCN reaction causing severe rash involving mucus membranes or skin necrosis: No Has patient had a PCN reaction that required hospitalization: No Has patient had a PCN reaction occurring within the last 10 years: Yes If all of the above answers are "NO", then may proceed with Cephalosporin use.     Family History  Problem Relation Age of Onset  . Hypertension Mother   . Obesity Mother   . Diabetes Father   . Kidney disease Father   . Sudden death Father     Social History:  reports that she has never smoked. She has never used smokeless tobacco. She reports that she does not drink alcohol or use drugs.  ROS: A complete review of systems was performed.  All systems are negative except for pertinent findings as noted.  Physical Exam:  Vital signs in last 24 hours: Temp:  [100.8 F (38.2 C)-103.1 F (39.5 C)] 103 F (39.4 C) (05/11 1736) Pulse Rate:  [93-107] 103 (05/11 1736) Resp:  [16-20] 16 (05/11 1736) BP: (112-134)/(70-88) 119/79 (05/11 1736) SpO2:  [93 %-100 %] 98 % (05/11 1736) Weight:  [79.4 kg (175 lb)-79.7 kg (175 lb 12.8 oz)] 79.7 kg (175 lb 12.8 oz) (05/11 1737) Constitutional:  Alert and oriented, No acute distress Cardiovascular: Regular rate and rhythm, No JVD Respiratory: Normal respiratory effort GI: Abdomen is soft, mild left CVAT, nondistended, no abdominal masses GU: Mild left CVAT Lymphatic: No lymphadenopathy Neurologic: Grossly intact, no focal deficits Psychiatric: Normal mood and  affect  Laboratory Data:  Recent Labs    11/04/17 1242  WBC 26.3*  HGB 12.9  HCT 40.2  PLT 232    Recent Labs    11/04/17 1242  NA 140  K 3.7  CL 106  GLUCOSE 105*  BUN 14  CALCIUM 9.0  CREATININE 1.33*     Results for orders placed or performed during the hospital encounter of 11/04/17 (from the past 24 hour(s))  Urinalysis, Routine w reflex microscopic     Status: Abnormal   Collection Time: 11/04/17 11:59 AM  Result Value Ref Range   Color, Urine YELLOW YELLOW   APPearance HAZY (A) CLEAR   Specific Gravity, Urine 1.025 1.005 - 1.030   pH 5.0 5.0 - 8.0   Glucose, UA NEGATIVE NEGATIVE mg/dL   Hgb urine dipstick MODERATE (A) NEGATIVE   Bilirubin Urine NEGATIVE NEGATIVE   Ketones, ur 20 (A) NEGATIVE mg/dL   Protein, ur 30 (A) NEGATIVE mg/dL   Nitrite NEGATIVE NEGATIVE   Leukocytes, UA TRACE (A) NEGATIVE   RBC / HPF 21-50 0 - 5 RBC/hpf   WBC, UA 11-20 0 - 5 WBC/hpf   Bacteria, UA RARE (A) NONE SEEN  Squamous Epithelial / LPF 6-10 0 - 5   Mucus PRESENT   I-Stat CG4 Lactic Acid, ED     Status: None   Collection Time: 11/04/17 12:41 PM  Result Value Ref Range   Lactic Acid, Venous 1.44 0.5 - 1.9 mmol/L  CBC with Differential     Status: Abnormal   Collection Time: 11/04/17 12:42 PM  Result Value Ref Range   WBC 26.3 (H) 4.0 - 10.5 K/uL   RBC 4.77 3.87 - 5.11 MIL/uL   Hemoglobin 12.9 12.0 - 15.0 g/dL   HCT 16.1 09.6 - 04.5 %   MCV 84.3 78.0 - 100.0 fL   MCH 27.0 26.0 - 34.0 pg   MCHC 32.1 30.0 - 36.0 g/dL   RDW 40.9 81.1 - 91.4 %   Platelets 232 150 - 400 K/uL   Neutrophils Relative % 87 %   Lymphocytes Relative 8 %   Monocytes Relative 5 %   Eosinophils Relative 0 %   Basophils Relative 0 %   Neutro Abs 22.9 (H) 1.7 - 7.7 K/uL   Lymphs Abs 2.1 0.7 - 4.0 K/uL   Monocytes Absolute 1.3 (H) 0.1 - 1.0 K/uL   Eosinophils Absolute 0.0 0.0 - 0.7 K/uL   Basophils Absolute 0.0 0.0 - 0.1 K/uL   RBC Morphology POLYCHROMASIA PRESENT    WBC Morphology INCREASED  BANDS (>20% BANDS)   Comprehensive metabolic panel     Status: Abnormal   Collection Time: 11/04/17 12:42 PM  Result Value Ref Range   Sodium 140 135 - 145 mmol/L   Potassium 3.7 3.5 - 5.1 mmol/L   Chloride 106 101 - 111 mmol/L   CO2 24 22 - 32 mmol/L   Glucose, Bld 105 (H) 65 - 99 mg/dL   BUN 14 6 - 20 mg/dL   Creatinine, Ser 7.82 (H) 0.44 - 1.00 mg/dL   Calcium 9.0 8.9 - 95.6 mg/dL   Total Protein 7.3 6.5 - 8.1 g/dL   Albumin 3.6 3.5 - 5.0 g/dL   AST 50 (H) 15 - 41 U/L   ALT 170 (H) 14 - 54 U/L   Alkaline Phosphatase 106 38 - 126 U/L   Total Bilirubin 0.9 0.3 - 1.2 mg/dL   GFR calc non Af Amer 52 (L) >60 mL/min   GFR calc Af Amer >60 >60 mL/min   Anion gap 10 5 - 15  Lipase, blood     Status: None   Collection Time: 11/04/17 12:42 PM  Result Value Ref Range   Lipase 23 11 - 51 U/L  I-Stat CG4 Lactic Acid, ED     Status: None   Collection Time: 11/04/17  3:02 PM  Result Value Ref Range   Lactic Acid, Venous 1.40 0.5 - 1.9 mmol/L   No results found for this or any previous visit (from the past 240 hour(s)).  Renal Function: Recent Labs    10/31/17 1758 11/01/17 0250 11/04/17 1242  CREATININE 0.77 0.64 1.33*   Estimated Creatinine Clearance: 58.8 mL/min (A) (by C-G formula based on SCr of 1.33 mg/dL (H)).  Radiologic Imaging: Dg Abdomen 1 View  Result Date: 11/04/2017 CLINICAL DATA:  LEFT flank pain since yesterday, chills, nausea, vomiting, aching, multiple prior kidney stones EXAM: ABDOMEN - 1 VIEW COMPARISON:  CT abdomen and pelvis 10/07/2017 FINDINGS: Surgical clips RIGHT upper quadrant likely cholecystectomy. 7 mm diameter calculus projects over expected position of the LEFT ureteropelvic junction; previously this was located at the lower pole of the LEFT kidney. No additional urinary  tract calcifications identified. Prominent stool RIGHT colon through proximal transverse colon. Small bowel gas pattern unremarkable. No acute osseous findings. IMPRESSION: 7 mm diameter  calculus projects over the LEFT ureteropelvic junction, or as previously this was located at the inferior pole of the LEFT kidney. Electronically Signed   By: Ulyses Southward M.D.   On: 11/04/2017 13:20   US Renal  Result Date: 11/04/2017 CLINICAL DATA:  32 year old female with left flank pain, nausea, decreased urine output. Left UPJ calculus suspected on radiographs today. EXAM: RENAL / URINARY TRACT ULTRASOUND COMPLETE COMPARISON:  Abdominal radiographs 1250 hours today. CT Abdomen and Pelvis 10/07/2017. FINDINGS: Right Kidney: Length: 12.2 centimeters. Lobulated cortex due to multifocal areas of cortical scarring redemonstrated. The residual cortical echogenicity remains normal. No hydronephrosis or right renal lesion. Left Kidney: Length: 12.1 centimeters. Areas of upper and lower pole cortical scarring redemonstrated. Cortical echogenicity remains normal. There is enlargement of the left upper pole calyx (image 19), and while initially the left renal pelvis does not seem dilated, I suspect that the remainder of the left renal collecting system is enlarged but contains echogenic debris (image 20). Nephrolithiasis is not evident by ultrasound. Bladder: Decompressed. IMPRESSION: 1. Probable acute left hydronephrosis with echogenic debris or blood throughout the left renal collecting system. Follow-up noncontrast CT Abdomen and Pelvis would be most valuable. 2. Underlying chronic bilateral renal scarring. No right hydronephrosis. Electronically Signed   By: Odessa Fleming M.D.   On: 11/04/2017 14:37   Dg Chest Port 1 View  Result Date: 11/04/2017 CLINICAL DATA:  Left-sided abdominal pain and chest pain for 2 days EXAM: PORTABLE CHEST 1 VIEW COMPARISON:  None. FINDINGS: The heart size and mediastinal contours are within normal limits. Both lungs are clear. The visualized skeletal structures are unremarkable. IMPRESSION: No active disease. Electronically Signed   By: Alcide Clever M.D.   On: 11/04/2017 17:29    I  independently reviewed the above imaging studies.  Impression/Recommendation: Left ureteral stone with developing sepsis: Continue IV antibiotics and await urine culture results.  Will proceed to the OR for cystoscopy and left ureteral stent placement tonight. I discussed the potential benefits and risks of the procedure, side effects of the proposed treatment, the likelihood of the patient achieving the goals of the procedure, and any potential problems that might occur during the procedure or recuperation.   Denica Web,LES 11/04/2017, 6:50 PM  Moody Bruins. MD   CC: Dr. Hanley Ben

## 2017-11-04 NOTE — Op Note (Signed)
Operative Note  Preoperative diagnosis:  1.  Left ureteral calculus with urinary tract infection and concern for sepsis  Post operative diagnosis: 1.  Left ureteral calculus with urinary tract infection and concern for sepsis  Procedure(s): 1.  Cystoscopy with left retrograde pyelogram and left ureteral stent placement  Surgeon: Modena Slater, MD  Assistants: None  Anesthesia: General  Complications: None immediate  EBL: Minimal  Specimens: 1.  None  Drains/Catheters: 1.  6 X 24 double-J ureteral stent  Intraoperative findings: 1.  Normal urethra and bladder 2.  Left retrograde pyelogram revealed a filling defect at the level of the stone at the ureteropelvic junction with upstream hydroureteronephrosis.  The stone was radiopaque  Indication: 32 year old female with a history of nephrolithiasis followed by Dr. Mena Goes.  She presented today with a 1 day history of left flank pain and a known left renal calculus about 1 month ago based on imaging.  She has a fever of 103 and tachycardia.  KUB was performed that showed a left ureteropelvic junction calculus and renal ultrasound was also performed that showed some hydronephrosis on the left.  Given the septic picture and obstruction, the decision was made to proceed with the above operation.  Description of procedure:  The patient was identified and consent was obtained.  The patient was taken to the operating room and placed in the supine position.  The patient was placed under general anesthesia.  Perioperative antibiotics were administered.  The patient was placed in dorsal lithotomy.  Patient was prepped and draped in a standard sterile fashion and a timeout was performed.  A 21 French rigid cystoscope was advanced into the urethra and into the bladder.  The left distal most portion of the ureter was cannulated with an open-ended ureteral catheter.  Retrograde pyelogram was performed with the findings noted above.  A sensor wire was  then advanced up to the kidney under fluoroscopic guidance.  A 6 X 24 double-J ureteral stent was advanced up to the kidney under fluoroscopic guidance.  The wire was withdrawn and fluoroscopy confirmed good proximal placement and direct visualization confirmed a good coil within the bladder.  The bladder was drained and the scope withdrawn.  This concluded the operation.  Patient tolerated procedure well and was stable postoperatively.  Plan: Maintain ureteral stent.  Continue antibiotics.  She will need outpatient ureteroscopy scheduled in the next couple of weeks.

## 2017-11-04 NOTE — Anesthesia Procedure Notes (Signed)
Procedure Name: LMA Insertion Date/Time: 11/04/2017 8:27 PM Performed by: Paris Lore, CRNA Pre-anesthesia Checklist: Patient identified, Emergency Drugs available, Suction available, Patient being monitored and Timeout performed Patient Re-evaluated:Patient Re-evaluated prior to induction Oxygen Delivery Method: Circle system utilized Preoxygenation: Pre-oxygenation with 100% oxygen Induction Type: IV induction Ventilation: Mask ventilation without difficulty LMA: LMA inserted LMA Size: 4.0 Number of attempts: 1 Placement Confirmation: positive ETCO2 and breath sounds checked- equal and bilateral Tube secured with: Tape

## 2017-11-04 NOTE — ED Triage Notes (Addendum)
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 Decreased urinary output reported. 113/61/-84-16-98%  Temp 101.2 has treated with Tylenol, GB removed last Thursday

## 2017-11-04 NOTE — ED Notes (Signed)
Patient took  of tylenol at 0815 and Phenergan at 0815. Patient is still very nauseas. Patient has not vomited today.

## 2017-11-04 NOTE — H&P (View-Only) (Signed)
Urology Consult   Physician requesting consult: Dr. Alekh  Reason for consult: Left ureteral stone and sepsis  History of Present Illness: Lauren Cardenas is a 32 y.o. with a history of urolithiasis followed by Dr. Eskridge.  She presented today with a one day history of left flank pain and had a known left renal calculus about one month ago on imaging. She developed fever to 103 today.  She has had nausea.  She denies hematuria.  KUB imaging and renal ultrasound today confirmed the previously noted stone to appear at the left UPJ measuring 7 mm.  She also had hydronephrosis.    Past Medical History:  Diagnosis Date  . Back pain   . Chronic migraine   . Constipation   . Dysuria   . GERD (gastroesophageal reflux disease)   . History of acute pyelonephritis    09-27-2015  . History of kidney stones   . HTN (hypertension)   . Interstitial cystitis   . Neck pain   . OAB (overactive bladder)   . Obesity   . Pneumonia   . Post-op pain 10/31/2017  . Pre-diabetes   . Right ureteral stone   . Shoulder pain   . Urgency of urination   . Vesicoureteral reflux     Past Surgical History:  Procedure Laterality Date  . CHOLECYSTECTOMY N/A 10/26/2017   Procedure: LAPAROSCOPIC CHOLECYSTECTOMY WITH INTRAOPERATIVE CHOLANGIOGRAM ERAS PATHWAY;  Surgeon: Cornett, Thomas, MD;  Location: [facility];  Service: General;  Laterality: N/A;  . CYSTOSCOPY W/ URETERAL STENT PLACEMENT Right 09/27/2015   Procedure: CYSTOSCOPY WITH RETROGRADE PYELOGRAM/URETERAL STENT PLACEMENT;  Surgeon: Brian James Budzyn, MD;  Location: [facility];  Service: Urology;  Laterality: Right;  . CYSTOSCOPY WITH RETROGRADE PYELOGRAM, URETEROSCOPY AND STENT PLACEMENT Left 09/09/2014   Procedure: CYSTOSCOPY WITH LEFT RETROGRADE PYELOGRAM, URETEROSCOPY AND STONE EXTRACTION  DOUBLE J STENT PLACEMENT;  Surgeon: Matthew Eskridge, MD;  Location: [facility];  Service: Urology;  Laterality: Left;  . CYSTOSCOPY/URETEROSCOPY/HOLMIUM LASER/STENT PLACEMENT  Right 10/16/2015   Procedure: RIGHT URETEROSCOPY/HOLMIUM LASER/STENT PLACEMENT;  Surgeon: Matthew Eskridge, MD;  Location: [facility];  Service: Urology;  Laterality: Right;  . HOLMIUM LASER APPLICATION Left 09/09/2014   Procedure: HOLMIUM LASER APPLICATION;  Surgeon: Matthew Eskridge, MD;  Location: [facility];  Service: Urology;  Laterality: Left;  . HOLMIUM LASER APPLICATION Right 10/16/2015   Procedure: HOLMIUM LASER APPLICATION;  Surgeon: Matthew Eskridge, MD;  Location: [facility];  Service: Urology;  Laterality: Right;  . STONE EXTRACTION WITH BASKET Right 10/16/2015   Procedure: STONE EXTRACTION WITH BASKET;  Surgeon: Matthew Eskridge, MD;  Location: [facility];  Service: Urology;  Laterality: Right;  . WISDOM TOOTH EXTRACTION  03/2014     Current Hospital Medications:  Home meds:  Current Meds  Medication Sig  . acetaminophen (TYLENOL) 500 MG tablet Take 1,000 mg by mouth every 6 (six) hours as needed for mild pain.  . HYDROcodone-acetaminophen (NORCO) 7.5-325 MG tablet Take 1 tablet by mouth every 6 (six) hours as needed for moderate pain.  . ibuprofen (ADVIL,MOTRIN) 800 MG tablet Take 1 tablet (800 mg total) by mouth every 8 (eight) hours as needed.  . promethazine (PHENERGAN) 25 MG tablet Take 1 tablet (25 mg total) as needed by mouth for nausea or vomiting.  . propranolol ER (INDERAL LA) 120 MG 24 hr capsule Take 1 capsule (120 mg total) at bedtime by mouth.  . rizatriptan (MAXALT-MLT) 10 MG disintegrating tablet Take 1 tablet (10 mg total) 3 (  three) times daily as needed by mouth for migraine.  . Vitamin D, Ergocalciferol, (DRISDOL) 50000 units CAPS capsule Take 1 capsule (50,000 Units total) by mouth every 14 (fourteen) days.    Scheduled Meds: . [MAR Hold] propranolol ER  120 mg Oral QHS   Continuous Infusions: . sodium chloride 125 mL/hr at 11/04/17 1815  . [MAR Hold] ciprofloxacin     PRN Meds:.[MAR Hold] acetaminophen **OR**  [MAR Hold] acetaminophen, [MAR Hold] HYDROcodone-acetaminophen, [MAR Hold] ketorolac, [MAR Hold] ondansetron **OR** [MAR Hold] ondansetron (ZOFRAN) IV, [MAR Hold] senna-docusate, [MAR Hold] SUMAtriptan  Allergies:  Allergies  Allergen Reactions  . Sudafed [Pseudoephedrine Hcl] Shortness Of Breath  . Amoxicillin Hives    Has patient had a PCN reaction causing immediate rash, facial/tongue/throat swelling, SOB or lightheadedness with hypotension: Yes Has patient had a PCN reaction causing severe rash involving mucus membranes or skin necrosis: No Has patient had a PCN reaction that required hospitalization: No Has patient had a PCN reaction occurring within the last 10 years: Yes If all of the above answers are "NO", then may proceed with Cephalosporin use.     Family History  Problem Relation Age of Onset  . Hypertension Mother   . Obesity Mother   . Diabetes Father   . Kidney disease Father   . Sudden death Father     Social History:  reports that she has never smoked. She has never used smokeless tobacco. She reports that she does not drink alcohol or use drugs.  ROS: A complete review of systems was performed.  All systems are negative except for pertinent findings as noted.  Physical Exam:  Vital signs in last 24 hours: Temp:  [100.8 F (38.2 C)-103.1 F (39.5 C)] 103 F (39.4 C) (05/11 1736) Pulse Rate:  [93-107] 103 (05/11 1736) Resp:  [16-20] 16 (05/11 1736) BP: (112-134)/(70-88) 119/79 (05/11 1736) SpO2:  [93 %-100 %] 98 % (05/11 1736) Weight:  [79.4 kg (175 lb)-79.7 kg (175 lb 12.8 oz)] 79.7 kg (175 lb 12.8 oz) (05/11 1737) Constitutional:  Alert and oriented, No acute distress Cardiovascular: Regular rate and rhythm, No JVD Respiratory: Normal respiratory effort GI: Abdomen is soft, mild left CVAT, nondistended, no abdominal masses GU: Mild left CVAT Lymphatic: No lymphadenopathy Neurologic: Grossly intact, no focal deficits Psychiatric: Normal mood and  affect  Laboratory Data:  Recent Labs    11/04/17 1242  WBC 26.3*  HGB 12.9  HCT 40.2  PLT 232    Recent Labs    11/04/17 1242  NA 140  K 3.7  CL 106  GLUCOSE 105*  BUN 14  CALCIUM 9.0  CREATININE 1.33*     Results for orders placed or performed during the hospital encounter of 11/04/17 (from the past 24 hour(s))  Urinalysis, Routine w reflex microscopic     Status: Abnormal   Collection Time: 11/04/17 11:59 AM  Result Value Ref Range   Color, Urine YELLOW YELLOW   APPearance HAZY (A) CLEAR   Specific Gravity, Urine 1.025 1.005 - 1.030   pH 5.0 5.0 - 8.0   Glucose, UA NEGATIVE NEGATIVE mg/dL   Hgb urine dipstick MODERATE (A) NEGATIVE   Bilirubin Urine NEGATIVE NEGATIVE   Ketones, ur 20 (A) NEGATIVE mg/dL   Protein, ur 30 (A) NEGATIVE mg/dL   Nitrite NEGATIVE NEGATIVE   Leukocytes, UA TRACE (A) NEGATIVE   RBC / HPF 21-50 0 - 5 RBC/hpf   WBC, UA 11-20 0 - 5 WBC/hpf   Bacteria, UA RARE (A) NONE SEEN     Squamous Epithelial / LPF 6-10 0 - 5   Mucus PRESENT   I-Stat CG4 Lactic Acid, ED     Status: None   Collection Time: 11/04/17 12:41 PM  Result Value Ref Range   Lactic Acid, Venous 1.44 0.5 - 1.9 mmol/L  CBC with Differential     Status: Abnormal   Collection Time: 11/04/17 12:42 PM  Result Value Ref Range   WBC 26.3 (H) 4.0 - 10.5 K/uL   RBC 4.77 3.87 - 5.11 MIL/uL   Hemoglobin 12.9 12.0 - 15.0 g/dL   HCT 40.2 36.0 - 46.0 %   MCV 84.3 78.0 - 100.0 fL   MCH 27.0 26.0 - 34.0 pg   MCHC 32.1 30.0 - 36.0 g/dL   RDW 13.8 11.5 - 15.5 %   Platelets 232 150 - 400 K/uL   Neutrophils Relative % 87 %   Lymphocytes Relative 8 %   Monocytes Relative 5 %   Eosinophils Relative 0 %   Basophils Relative 0 %   Neutro Abs 22.9 (H) 1.7 - 7.7 K/uL   Lymphs Abs 2.1 0.7 - 4.0 K/uL   Monocytes Absolute 1.3 (H) 0.1 - 1.0 K/uL   Eosinophils Absolute 0.0 0.0 - 0.7 K/uL   Basophils Absolute 0.0 0.0 - 0.1 K/uL   RBC Morphology POLYCHROMASIA PRESENT    WBC Morphology INCREASED  BANDS (>20% BANDS)   Comprehensive metabolic panel     Status: Abnormal   Collection Time: 11/04/17 12:42 PM  Result Value Ref Range   Sodium 140 135 - 145 mmol/L   Potassium 3.7 3.5 - 5.1 mmol/L   Chloride 106 101 - 111 mmol/L   CO2 24 22 - 32 mmol/L   Glucose, Bld 105 (H) 65 - 99 mg/dL   BUN 14 6 - 20 mg/dL   Creatinine, Ser 1.33 (H) 0.44 - 1.00 mg/dL   Calcium 9.0 8.9 - 10.3 mg/dL   Total Protein 7.3 6.5 - 8.1 g/dL   Albumin 3.6 3.5 - 5.0 g/dL   AST 50 (H) 15 - 41 U/L   ALT 170 (H) 14 - 54 U/L   Alkaline Phosphatase 106 38 - 126 U/L   Total Bilirubin 0.9 0.3 - 1.2 mg/dL   GFR calc non Af Amer 52 (L) >60 mL/min   GFR calc Af Amer >60 >60 mL/min   Anion gap 10 5 - 15  Lipase, blood     Status: None   Collection Time: 11/04/17 12:42 PM  Result Value Ref Range   Lipase 23 11 - 51 U/L  I-Stat CG4 Lactic Acid, ED     Status: None   Collection Time: 11/04/17  3:02 PM  Result Value Ref Range   Lactic Acid, Venous 1.40 0.5 - 1.9 mmol/L   No results found for this or any previous visit (from the past 240 hour(s)).  Renal Function: Recent Labs    10/31/17 1758 11/01/17 0250 11/04/17 1242  CREATININE 0.77 0.64 1.33*   Estimated Creatinine Clearance: 58.8 mL/min (A) (by C-G formula based on SCr of 1.33 mg/dL (H)).  Radiologic Imaging: Dg Abdomen 1 View  Result Date: 11/04/2017 CLINICAL DATA:  LEFT flank pain since yesterday, chills, nausea, vomiting, aching, multiple prior kidney stones EXAM: ABDOMEN - 1 VIEW COMPARISON:  CT abdomen and pelvis 10/07/2017 FINDINGS: Surgical clips RIGHT upper quadrant likely cholecystectomy. 7 mm diameter calculus projects over expected position of the LEFT ureteropelvic junction; previously this was located at the lower pole of the LEFT kidney. No additional urinary   tract calcifications identified. Prominent stool RIGHT colon through proximal transverse colon. Small bowel gas pattern unremarkable. No acute osseous findings. IMPRESSION: 7 mm diameter  calculus projects over the LEFT ureteropelvic junction, or as previously this was located at the inferior pole of the LEFT kidney. Electronically Signed   By: Mark  Boles M.D.   On: 11/04/2017 13:20   Us Renal  Result Date: 11/04/2017 CLINICAL DATA:  31-year-old female with left flank pain, nausea, decreased urine output. Left UPJ calculus suspected on radiographs today. EXAM: RENAL / URINARY TRACT ULTRASOUND COMPLETE COMPARISON:  Abdominal radiographs 1250 hours today. CT Abdomen and Pelvis 10/07/2017. FINDINGS: Right Kidney: Length: 12.2 centimeters. Lobulated cortex due to multifocal areas of cortical scarring redemonstrated. The residual cortical echogenicity remains normal. No hydronephrosis or right renal lesion. Left Kidney: Length: 12.1 centimeters. Areas of upper and lower pole cortical scarring redemonstrated. Cortical echogenicity remains normal. There is enlargement of the left upper pole calyx (image 19), and while initially the left renal pelvis does not seem dilated, I suspect that the remainder of the left renal collecting system is enlarged but contains echogenic debris (image 20). Nephrolithiasis is not evident by ultrasound. Bladder: Decompressed. IMPRESSION: 1. Probable acute left hydronephrosis with echogenic debris or blood throughout the left renal collecting system. Follow-up noncontrast CT Abdomen and Pelvis would be most valuable. 2. Underlying chronic bilateral renal scarring. No right hydronephrosis. Electronically Signed   By: H  Hall M.D.   On: 11/04/2017 14:37   Dg Chest Port 1 View  Result Date: 11/04/2017 CLINICAL DATA:  Left-sided abdominal pain and chest pain for 2 days EXAM: PORTABLE CHEST 1 VIEW COMPARISON:  None. FINDINGS: The heart size and mediastinal contours are within normal limits. Both lungs are clear. The visualized skeletal structures are unremarkable. IMPRESSION: No active disease. Electronically Signed   By: Mark  Lukens M.D.   On: 11/04/2017 17:29    I  independently reviewed the above imaging studies.  Impression/Recommendation: Left ureteral stone with developing sepsis: Continue IV antibiotics and await urine culture results.  Will proceed to the OR for cystoscopy and left ureteral stent placement tonight. I discussed the potential benefits and risks of the procedure, side effects of the proposed treatment, the likelihood of the patient achieving the goals of the procedure, and any potential problems that might occur during the procedure or recuperation.   Angeleena Dueitt,LES 11/04/2017, 6:50 PM  Frederic Tones S. Lashan Macias Jr. MD   CC: Dr. Alekh  

## 2017-11-05 DIAGNOSIS — N12 Tubulo-interstitial nephritis, not specified as acute or chronic: Secondary | ICD-10-CM

## 2017-11-05 LAB — CBC
HCT: 39.5 % (ref 36.0–46.0)
HEMOGLOBIN: 12.6 g/dL (ref 12.0–15.0)
MCH: 26.9 pg (ref 26.0–34.0)
MCHC: 31.9 g/dL (ref 30.0–36.0)
MCV: 84.4 fL (ref 78.0–100.0)
Platelets: 222 10*3/uL (ref 150–400)
RBC: 4.68 MIL/uL (ref 3.87–5.11)
RDW: 14.2 % (ref 11.5–15.5)
WBC: 20.2 10*3/uL — ABNORMAL HIGH (ref 4.0–10.5)

## 2017-11-05 LAB — URINE CULTURE

## 2017-11-05 LAB — COMPREHENSIVE METABOLIC PANEL
ALBUMIN: 3.1 g/dL — AB (ref 3.5–5.0)
ALK PHOS: 92 U/L (ref 38–126)
ALT: 124 U/L — AB (ref 14–54)
ANION GAP: 12 (ref 5–15)
AST: 34 U/L (ref 15–41)
BUN: 13 mg/dL (ref 6–20)
CALCIUM: 8.1 mg/dL — AB (ref 8.9–10.3)
CO2: 21 mmol/L — ABNORMAL LOW (ref 22–32)
CREATININE: 1.08 mg/dL — AB (ref 0.44–1.00)
Chloride: 107 mmol/L (ref 101–111)
GFR calc Af Amer: >60 mL/min (ref >60)
GFR calc non Af Amer: >60 mL/min (ref >60)
GLUCOSE: 153 mg/dL — AB (ref 65–99)
Potassium: 4.3 mmol/L (ref 3.5–5.1)
Sodium: 140 mmol/L (ref 135–145)
TOTAL PROTEIN: 6.9 g/dL (ref 6.5–8.1)
Total Bilirubin: 0.8 mg/dL (ref 0.3–1.2)

## 2017-11-05 NOTE — Progress Notes (Signed)
Urology Inpatient Progress Report  Kidney stone [N20.0] Acute pyelonephritis [N10] Sepsis (HCC) [A41.9] Sepsis, due to unspecified organism (HCC) [A41.9]  Procedure(s): CYSTOSCOPY WITH RETROGRADE AND LEFT STENT PLACEMENT  1 Day Post-Op   Intv/Subj: No acute events overnight. Patient is without complaint. Vitals are better.  Pain resolved, she feels much improved.  Remains on IV ciprofloxacin.  Still with some leukocytosis but it is improved from yesterday.  Active Problems:   Acute pyelonephritis   Sepsis (HCC)   Leukocytosis   Ureterolithiasis   Pyelonephritis  Current Facility-Administered Medications  Medication Dose Route Frequency Provider Last Rate Last Dose  . 0.9 %  sodium chloride infusion   Intravenous Continuous Ray Church III, MD 125 mL/hr at 11/05/17 0732 1,000 mL at 11/05/17 0732  . acetaminophen (TYLENOL) tablet 650 mg  650 mg Oral Q6H PRN Ray Church III, MD       Or  . acetaminophen (TYLENOL) suppository 650 mg  650 mg Rectal Q6H PRN Ray Church III, MD      . ciprofloxacin (CIPRO) IVPB 400 mg  400 mg Intravenous Q12H Ray Church III, MD 200 mL/hr at 11/05/17 0921 400 mg at 11/05/17 0921  . HYDROcodone-acetaminophen (NORCO) 7.5-325 MG per tablet 1 tablet  1 tablet Oral Q6H PRN Ray Church III, MD      . ketorolac (TORADOL) 30 MG/ML injection 30 mg  30 mg Intravenous Q6H PRN Ray Church III, MD   30 mg at 11/04/17 2221  . ondansetron (ZOFRAN) tablet 4 mg  4 mg Oral Q6H PRN Ray Church III, MD       Or  . ondansetron (ZOFRAN) injection 4 mg  4 mg Intravenous Q6H PRN Ray Church III, MD      . propranolol ER (INDERAL LA) 24 hr capsule 120 mg  120 mg Oral QHS Ray Church III, MD   120 mg at 11/04/17 2221  . senna-docusate (Senokot-S) tablet 1 tablet  1 tablet Oral QHS PRN Ray Church III, MD      . SUMAtriptan [facility]) tablet 25 mg  25 mg Oral Q2H PRN Ray Church III, MD         Objective: Vital: Vitals:   11/04/17 2145  11/04/17 2250 11/04/17 2358 11/05/17 0618  BP: 112/69  116/71 116/73  Pulse: 100  85 69  Resp: Temp: (!) 102.4 F (39.1 C) 99.6 F (37.6 C) 98.4 F (36.9 C) 97.7 F (36.5 C)  TempSrc:   Oral Oral  SpO2: 99%  98% 99%  Weight:      Height:       I/Os: I/O last 3 completed shifts: In: 3433.3 [I.V.:1850; IV Piggyback:1583.3] Out: 5 [Blood:5]  Physical Exam:  General: Patient is in no apparent distress Lungs: Normal respiratory effort, chest expands symmetrically. GI: The abdomen is soft and nontender without mass.  No CVA tenderness bilaterally Ext: lower extremities symmetric  Lab Results: Recent Labs    11/04/17 1242 11/05/17 0359  WBC 26.3* 20.2*  HGB 12.9 12.6  HCT 40.2 39.5   Recent Labs    11/04/17 1242 11/05/17 0359  NA 140 140  K 3.7 4.3  CL 106 107  CO2 24 21*  GLUCOSE 105* 153*  BUN 14 13  CREATININE 1.33* 1.08*  CALCIUM 9.0 8.1*   No results for input(s): LABPT, INR in the last 72 hours. No results for input(s): LABURIN in the last 72 hours. Results for  orders placed or performed during the hospital encounter of 11/04/17  Urine Culture     Status: None (Preliminary result)   Collection Time: 11/04/17  8:38 PM  Result Value Ref Range Status   Specimen Description CYSTOSCOPY  Final   Special Requests URINE, RANDOM  Final   Culture PENDING  Incomplete   Report Status PENDING  Incomplete    Studies/Results: Dg Abdomen 1 View  Result Date: 11/04/2017 CLINICAL DATA:  LEFT flank pain since yesterday, chills, nausea, vomiting, aching, multiple prior kidney stones EXAM: ABDOMEN - 1 VIEW COMPARISON:  CT abdomen and pelvis 10/07/2017 FINDINGS: Surgical clips RIGHT upper quadrant likely cholecystectomy. 7 mm diameter calculus projects over expected position of the LEFT ureteropelvic junction; previously this was located at the lower pole of the LEFT kidney. No additional urinary tract calcifications identified. Prominent stool RIGHT colon through  proximal transverse colon. Small bowel gas pattern unremarkable. No acute osseous findings. IMPRESSION: 7 mm diameter calculus projects over the LEFT ureteropelvic junction, or as previously this was located at the inferior pole of the LEFT kidney. Electronically Signed   By: Ulyses Southward M.D.   On: 11/04/2017 13:20   US Renal  Result Date: 11/04/2017 CLINICAL DATA:  32 year old female with left flank pain, nausea, decreased urine output. Left UPJ calculus suspected on radiographs today. EXAM: RENAL / URINARY TRACT ULTRASOUND COMPLETE COMPARISON:  Abdominal radiographs 1250 hours today. CT Abdomen and Pelvis 10/07/2017. FINDINGS: Right Kidney: Length: 12.2 centimeters. Lobulated cortex due to multifocal areas of cortical scarring redemonstrated. The residual cortical echogenicity remains normal. No hydronephrosis or right renal lesion. Left Kidney: Length: 12.1 centimeters. Areas of upper and lower pole cortical scarring redemonstrated. Cortical echogenicity remains normal. There is enlargement of the left upper pole calyx (image 19), and while initially the left renal pelvis does not seem dilated, I suspect that the remainder of the left renal collecting system is enlarged but contains echogenic debris (image 20). Nephrolithiasis is not evident by ultrasound. Bladder: Decompressed. IMPRESSION: 1. Probable acute left hydronephrosis with echogenic debris or blood throughout the left renal collecting system. Follow-up noncontrast CT Abdomen and Pelvis would be most valuable. 2. Underlying chronic bilateral renal scarring. No right hydronephrosis. Electronically Signed   By: Odessa Fleming M.D.   On: 11/04/2017 14:37   Dg Chest Port 1 View  Result Date: 11/04/2017 CLINICAL DATA:  Left-sided abdominal pain and chest pain for 2 days EXAM: PORTABLE CHEST 1 VIEW COMPARISON:  None. FINDINGS: The heart size and mediastinal contours are within normal limits. Both lungs are clear. The visualized skeletal structures are  unremarkable. IMPRESSION: No active disease. Electronically Signed   By: Alcide Clever M.D.   On: 11/04/2017 17:29   Dg C-arm 1-60 Min-no Report  Result Date: 11/04/2017 Fluoroscopy was utilized by the requesting physician.  No radiographic interpretation.    Assessment: Left ureteral calculus with urinary tract infection  Procedure(s): CYSTOSCOPY WITH RETROGRADE AND LEFT STENT PLACEMENT, 1 Day Post-Op  doing well.  Plan: Continue ciprofloxacin.  I would switch to oral.  Likely discharge this afternoon or tomorrow.  Agree with following up on urine culture.   Modena Slater, MD Urology 11/05/2017, 10:56 AM

## 2017-11-05 NOTE — Progress Notes (Signed)
[facility] PROGRESS NOTE    Progress Note  Lauren Cardenas  [name]:[id number] DOB: [birth year] DOA: 11/04/2017 PCP: Elenora Gamma, MD     Brief Narrative:   Lauren Cardenas is an 32 y.o. female past medical history of pyelonephritis, and nephrolithiasis with right-sided hydronephrosis and a stent placed in 2017 followed by removal with recent laparoscopic cholecystectomy on 10/26/2017 comes in with sudden onset of abdominal pain which started the day prior to admission.  She had an MRCP and MRI that showed a left-sided ureteral stone  Assessment/Plan:   Sepsis (HCC) due to Acute pyelonephritis with left-sided obstructed uropathy secondary to urethral stone: Due to her penicillin allergy she was started empirically on IV ciprofloxacin, she was fluid resuscitated and her blood pressure has been stable. KUB was done that showed a left ureteral stone Culture data is pending. Urology was consulted and patient was surgically intervene with cystoscopy left retrograde pyelogram with left ureteral stent on 11/04/2017.  Leukocytosis Likely due to infectious etiology.  Acute kidney injury: Likely prerenal, baseline creatinine less than 1 is improving with IV fluid hydration continue IV fluids recheck basic metabolic panel in the morning.  Elevated LFTs: ALT more than AST, will need further follow-up as an outpatient but they are trending down compared to 11/02/2017.  Essential hypertension: Stable continue current management.  DVT prophylaxis: lovenox Family Communication:none Disposition Plan/Barrier to D/C: Once cultures back Code Status:     Code Status Orders  (From admission, onward)        Start     Ordered   11/04/17 1640  Full code  Continuous     11/04/17 1643    Code Status History    Date Active Date Inactive Code Status Order ID Comments User Context   11/01/2017 0049 11/02/2017 1426 Full Code [id number]  Manus Rudd, MD ED        IV Access:     Peripheral IV   Procedures and diagnostic studies:   Dg Abdomen 1 View  Result Date: 11/04/2017 CLINICAL DATA:  LEFT flank pain since yesterday, chills, nausea, vomiting, aching, multiple prior kidney stones EXAM: ABDOMEN - 1 VIEW COMPARISON:  CT abdomen and pelvis 10/07/2017 FINDINGS: Surgical clips RIGHT upper quadrant likely cholecystectomy. 7 mm diameter calculus projects over expected position of the LEFT ureteropelvic junction; previously this was located at the lower pole of the LEFT kidney. No additional urinary tract calcifications identified. Prominent stool RIGHT colon through proximal transverse colon. Small bowel gas pattern unremarkable. No acute osseous findings. IMPRESSION: 7 mm diameter calculus projects over the LEFT ureteropelvic junction, or as previously this was located at the inferior pole of the LEFT kidney. Electronically Signed   By: Ulyses Southward M.D.   On: 11/04/2017 13:20   US Renal  Result Date: 11/04/2017 CLINICAL DATA:  32 year old female with left flank pain, nausea, decreased urine output. Left UPJ calculus suspected on radiographs today. EXAM: RENAL / URINARY TRACT ULTRASOUND COMPLETE COMPARISON:  Abdominal radiographs 1250 hours today. CT Abdomen and Pelvis 10/07/2017. FINDINGS: Right Kidney: Length: 12.2 centimeters. Lobulated cortex due to multifocal areas of cortical scarring redemonstrated. The residual cortical echogenicity remains normal. No hydronephrosis or right renal lesion. Left Kidney: Length: 12.1 centimeters. Areas of upper and lower pole cortical scarring redemonstrated. Cortical echogenicity remains normal. There is enlargement of the left upper pole calyx (image 19), and while initially the left renal pelvis does not seem dilated, I suspect that the remainder of the left renal collecting system is enlarged but  contains echogenic debris (image 20). Nephrolithiasis is not evident by ultrasound. Bladder: Decompressed. IMPRESSION: 1. Probable acute left  hydronephrosis with echogenic debris or blood throughout the left renal collecting system. Follow-up noncontrast CT Abdomen and Pelvis would be most valuable. 2. Underlying chronic bilateral renal scarring. No right hydronephrosis. Electronically Signed   By: Odessa Fleming M.D.   On: 11/04/2017 14:37   Dg Chest Port 1 View  Result Date: 11/04/2017 CLINICAL DATA:  Left-sided abdominal pain and chest pain for 2 days EXAM: PORTABLE CHEST 1 VIEW COMPARISON:  None. FINDINGS: The heart size and mediastinal contours are within normal limits. Both lungs are clear. The visualized skeletal structures are unremarkable. IMPRESSION: No active disease. Electronically Signed   By: Alcide Clever M.D.   On: 11/04/2017 17:29   Dg C-arm 1-60 Min-no Report  Result Date: 11/04/2017 Fluoroscopy was utilized by the requesting physician.  No radiographic interpretation.     Medical Consultants:    None.  Anti-Infectives:   IV ciprofloxacin  Subjective:    Lauren Cardenas she relates she feels much better compared to yesterday.  Tolerating her diet.  Objective:    Vitals:   11/04/17 2145 11/04/17 2250 11/04/17 2358 11/05/17 0618  BP: 112/69  116/71 116/73  Pulse: 100  85 69  Resp: Temp: (!) 102.4 F (39.1 C) 99.6 F (37.6 C) 98.4 F (36.9 C) 97.7 F (36.5 C)  TempSrc:   Oral Oral  SpO2: 99%  98% 99%  Weight:      Height:        Intake/Output Summary (Last 24 hours) at 11/05/2017 0815 Last data filed at 11/05/2017 0400 Gross per 24 hour  Intake 3433.33 ml  Output 5 ml  Net 3428.33 ml   Filed Weights   11/04/17 1112 11/04/17 1737  Weight: 79.4 kg (175 lb) 79.7 kg (175 lb 12.8 oz)    Exam: General exam: In no acute distress. Respiratory system: Good air movement and clear to auscultation. Cardiovascular system: S1 & S2 heard, RRR.  Gastrointestinal system: Abdomen is nondistended, soft and nontender.  Central nervous system: Alert and oriented. No focal neurological  deficits. Extremities: No pedal edema. Skin: No rashes, lesions or ulcers Psychiatry: Judgement and insight appear normal. Mood & affect appropriate.    Data Reviewed:    Labs: Basic Metabolic Panel: Recent Labs  Lab 10/31/17 1758 11/01/17 0250 11/04/17 1242 11/05/17 0359  NA 142 139 140 140  K 4.1 3.7 3.7 4.3  CL 106 109 106 107  CO2 21*  GLUCOSE 161* 126* 105* 153*  BUN CREATININE 0.77 0.64 1.33* 1.08*  CALCIUM 9.5 8.2* 9.0 8.1*   GFR Estimated Creatinine Clearance: 72.4 mL/min (A) (by C-G formula based on SCr of 1.08 mg/dL (H)). Liver Function Tests: Recent Labs  Lab 10/31/17 1912 11/01/17 0250 11/02/17 0549 11/04/17 1242 11/05/17 0359  AST 144* 336* 207* 50* 34  ALT 79* 240* 378* 170* 124*  ALKPHOS 105 109 138* 106 92  BILITOT 1.5* 1.9* 1.2 0.9 0.8  PROT 7.3 6.3* 6.5 7.3 6.9  ALBUMIN 3.8 3.0* 3.1* 3.6 3.1*   Recent Labs  Lab 10/31/17 1912 11/04/17 1242  LIPASE 33 23   No results for input(s): AMMONIA in the last 168 hours. Coagulation profile No results for input(s): INR, PROTIME in the last 168 hours.  CBC: Recent Labs  Lab 10/31/17 1758 11/01/17 0250 11/01/17 1341 11/04/17 1242 11/05/17 0359  WBC 19.7*  12.8* 8.5 26.3* 20.2*  NEUTROABS 16.4*  --   --  22.9*  --   HGB 14.1 12.1 13.0 12.9 12.6  HCT 43.4 37.5 39.8 40.2 39.5  MCV 82.5 82.2 83.3 84.3 84.4  PLT 270 230 223 232 222   Cardiac Enzymes: No results for input(s): CKTOTAL, CKMB, CKMBINDEX, TROPONINI in the last 168 hours. BNP (last 3 results) No results for input(s): PROBNP in the last 8760 hours. CBG: No results for input(s): GLUCAP in the last 168 hours. D-Dimer: No results for input(s): DDIMER in the last 72 hours. Hgb A1c: No results for input(s): HGBA1C in the last 72 hours. Lipid Profile: No results for input(s): CHOL, HDL, LDLCALC, TRIG, CHOLHDL, LDLDIRECT in the last 72 hours. Thyroid function studies: No results for input(s): TSH, T4TOTAL, T3FREE,  THYROIDAB in the last 72 hours.  Invalid input(s): FREET3 Anemia work up: No results for input(s): VITAMINB12, FOLATE, FERRITIN, TIBC, IRON, RETICCTPCT in the last 72 hours. Sepsis Labs: Recent Labs  Lab 11/01/17 0250 11/01/17 1341 11/04/17 1241 11/04/17 1242 11/04/17 1502 11/05/17 0359  WBC 12.8* 8.5  --  26.3*  --  20.2*  LATICACIDVEN  --   --  1.44  --  1.40  --    Microbiology Recent Results (from the past 240 hour(s))  Urine Culture     Status: None (Preliminary result)   Collection Time: 11/04/17  8:38 PM  Result Value Ref Range Status   Specimen Description CYSTOSCOPY  Final   Special Requests URINE, RANDOM  Final   Culture PENDING  Incomplete   Report Status PENDING  Incomplete     Medications:   . propranolol ER  120 mg Oral QHS   Continuous Infusions: . sodium chloride 1,000 mL (11/05/17 0732)  . ciprofloxacin Stopped (11/04/17 2336)     LOS: 1 day   Marinda Elk  [facility] Pager [phone]  *Please refer to [email or web address], password [email or web address] to get updated schedule on who will round on this patient, as hospitalists switch teams weekly. If 7PM-7AM, please contact night-coverage at [email or web address], password [email or web address] for any overnight needs.  11/05/2017, 8:15 AM

## 2017-11-06 ENCOUNTER — Encounter (HOSPITAL_COMMUNITY): Payer: Self-pay | Admitting: Urology

## 2017-11-06 MED ORDER — SODIUM CHLORIDE 0.9 % IV SOLN
1.0000 g | INTRAVENOUS | Status: DC
  Filled 2017-11-06: qty 10

## 2017-11-06 MED ORDER — CIPROFLOXACIN HCL 500 MG PO TABS: 500.0000 mg | ORAL_TABLET | Freq: Two times a day (BID) | ORAL | Status: DC

## 2017-11-06 MED ORDER — CIPROFLOXACIN IN D5W 400 MG/200ML IV SOLN: 400.0000 mg | Freq: Two times a day (BID) | INTRAVENOUS | Status: DC

## 2017-11-06 MED ORDER — SODIUM CHLORIDE 0.9 % IV SOLN
1.0000 g | INTRAVENOUS | Status: DC
  Administered 2017-11-06 – 2017-11-08 (×3): 1 g via INTRAVENOUS
  Filled 2017-11-06 (×2): qty 1
  Filled 2017-11-06: qty 10

## 2017-11-06 MED ORDER — IBUPROFEN 200 MG PO TABS
400.0000 mg | ORAL_TABLET | Freq: Once | ORAL | Status: AC
  Administered 2017-11-06: 400 mg via ORAL
  Filled 2017-11-06: qty 2

## 2017-11-06 NOTE — Plan of Care (Signed)
Reviewed plan of care, specifically pain and fever control, safety measures, and importance of notifying RN with any questions or concerns. Pt attentive and verbalized understanding of all education.

## 2017-11-06 NOTE — Progress Notes (Signed)
PHARMACIST - PHYSICIAN COMMUNICATION DR:   David Stall  CONCERNING: Antibiotic IV to Oral Route Change Policy  RECOMMENDATION: This patient is receiving ciprofloxacin by the intravenous route.  Based on criteria approved by the Pharmacy and Therapeutics Committee, the antibiotic(s) is/are being converted to the equivalent oral dose form(s).   DESCRIPTION: These criteria include:  Patient being treated for a respiratory tract infection, urinary tract infection, cellulitis or clostridium difficile associated diarrhea if on metronidazole  The patient is not neutropenic and does not exhibit a GI malabsorption state  The patient is eating (either orally or via tube) and/or has been taking other orally administered medications for a least 24 hours  The patient is improving clinically and has a Tmax < 100.5  If you have questions about this conversion, please contact the Pharmacy Department    [phone] )  [facility]   [phone] )  [facility]   [phone] )  [facility]   [phone] )  [facility]   [phone] )  [facility]   Dorna Leitz, PharmD, BCPS 11/06/2017 8:06 AM

## 2017-11-06 NOTE — Progress Notes (Signed)
[facility] PROGRESS NOTE    Progress Note  MARESHA ANASTOS  WJX:[id number] DOB: [birth year] DOA: 11/04/2017 PCP: Elenora Gamma, MD     Brief Narrative:   Lauren Cardenas is an 32 y.o. female past medical history of pyelonephritis, and nephrolithiasis with right-sided hydronephrosis and a stent placed in 2017 followed by removal with recent laparoscopic cholecystectomy on 10/26/2017 comes in with sudden onset of abdominal pain which started the day prior to admission.  She had an MRCP and MRI that showed a left-sided ureteral stone  Assessment/Plan:   Sepsis (HCC) due to Acute pyelonephritis with left-sided obstructed uropathy secondary to urethral stone: She continues to have fevers despite IV antibiotics, I discussed with patient and we both agreed is a good idea to change her to IV Rocephin.  There is a less than 5% chance of cross reactivity between penicillins and Rocephin patient understands the risk and is willing to try Rocephin. KUB was done that showed a left ureteral stone Culture data is pending.  Leukocytosis Likely due to infectious etiology.  Acute kidney injury: Likely prerenal, baseline creatinine less than 1 is improving with IV fluid hydration continue IV fluids recheck basic metabolic panel in the morning.  Elevated LFTs: ALT more than AST, will need further follow-up as an outpatient but they are trending down compared to 11/02/2017.  Essential hypertension: Stable continue current management.  DVT prophylaxis: lovenox Family Communication:none Disposition Plan/Barrier to D/C: Once cultures back Code Status:     Code Status Orders  (From admission, onward)        Start     Ordered   11/04/17 1640  Full code  Continuous     11/04/17 1643    Code Status History    Date Active Date Inactive Code Status Order ID Comments User Context   11/01/2017 0049 11/02/2017 1426 Full Code [id number]  Manus Rudd, MD ED        IV Access:     Peripheral IV   Procedures and diagnostic studies:   Dg Abdomen 1 View  Result Date: 11/04/2017 CLINICAL DATA:  LEFT flank pain since yesterday, chills, nausea, vomiting, aching, multiple prior kidney stones EXAM: ABDOMEN - 1 VIEW COMPARISON:  CT abdomen and pelvis 10/07/2017 FINDINGS: Surgical clips RIGHT upper quadrant likely cholecystectomy. 7 mm diameter calculus projects over expected position of the LEFT ureteropelvic junction; previously this was located at the lower pole of the LEFT kidney. No additional urinary tract calcifications identified. Prominent stool RIGHT colon through proximal transverse colon. Small bowel gas pattern unremarkable. No acute osseous findings. IMPRESSION: 7 mm diameter calculus projects over the LEFT ureteropelvic junction, or as previously this was located at the inferior pole of the LEFT kidney. Electronically Signed   By: Ulyses Southward M.D.   On: 11/04/2017 13:20   US Renal  Result Date: 11/04/2017 CLINICAL DATA:  32 year old female with left flank pain, nausea, decreased urine output. Left UPJ calculus suspected on radiographs today. EXAM: RENAL / URINARY TRACT ULTRASOUND COMPLETE COMPARISON:  Abdominal radiographs 1250 hours today. CT Abdomen and Pelvis 10/07/2017. FINDINGS: Right Kidney: Length: 12.2 centimeters. Lobulated cortex due to multifocal areas of cortical scarring redemonstrated. The residual cortical echogenicity remains normal. No hydronephrosis or right renal lesion. Left Kidney: Length: 12.1 centimeters. Areas of upper and lower pole cortical scarring redemonstrated. Cortical echogenicity remains normal. There is enlargement of the left upper pole calyx (image 19), and while initially the left renal pelvis does not seem dilated, I suspect that the remainder  of the left renal collecting system is enlarged but contains echogenic debris (image 20). Nephrolithiasis is not evident by ultrasound. Bladder: Decompressed. IMPRESSION: 1. Probable acute left  hydronephrosis with echogenic debris or blood throughout the left renal collecting system. Follow-up noncontrast CT Abdomen and Pelvis would be most valuable. 2. Underlying chronic bilateral renal scarring. No right hydronephrosis. Electronically Signed   By: Odessa Fleming M.D.   On: 11/04/2017 14:37   Dg Chest Port 1 View  Result Date: 11/04/2017 CLINICAL DATA:  Left-sided abdominal pain and chest pain for 2 days EXAM: PORTABLE CHEST 1 VIEW COMPARISON:  None. FINDINGS: The heart size and mediastinal contours are within normal limits. Both lungs are clear. The visualized skeletal structures are unremarkable. IMPRESSION: No active disease. Electronically Signed   By: Alcide Clever M.D.   On: 11/04/2017 17:29   Dg C-arm 1-60 Min-no Report  Result Date: 11/04/2017 Fluoroscopy was utilized by the requesting physician.  No radiographic interpretation.     Medical Consultants:    None.  Anti-Infectives:   IV ciprofloxacin  Subjective:    Ardella D Patron she is enjoying her diet.  Objective:    Vitals:   11/05/17 0618 11/05/17 1341 11/05/17 2114 11/06/17 0623  BP: 116/73 128/79 122/75 137/87  Pulse: 69 75 73 81  Resp: Temp: 97.7 F (36.5 C) 98.9 F (37.2 C) 98.6 F (37 C) (!) 100.7 F (38.2 C)  TempSrc: Oral Oral Oral Oral  SpO2: 99% 100% 100% 100%  Weight:      Height:        Intake/Output Summary (Last 24 hours) at 11/06/2017 1013 Last data filed at 11/05/2017 1658 Gross per 24 hour  Intake 600 ml  Output -  Net 600 ml   Filed Weights   11/04/17 1112 11/04/17 1737  Weight: 79.4 kg (175 lb) 79.7 kg (175 lb 12.8 oz)    Exam: General exam: In no acute distress. Respiratory system: Good air movement and clear to auscultation. Cardiovascular system: S1 & S2 heard, RRR.  Gastrointestinal system: Abdomen is nondistended, soft and nontender.  Central nervous system: Alert and oriented. No focal neurological deficits. Extremities: No pedal edema. Skin: No  rashes, lesions or ulcers Psychiatry: Judgement and insight appear normal. Mood & affect appropriate.    Data Reviewed:    Labs: Basic Metabolic Panel: Recent Labs  Lab 10/31/17 1758 11/01/17 0250 11/04/17 1242 11/05/17 0359  NA 142 139 140 140  K 4.1 3.7 3.7 4.3  CL 106 109 106 107  CO2 21*  GLUCOSE 161* 126* 105* 153*  BUN CREATININE 0.77 0.64 1.33* 1.08*  CALCIUM 9.5 8.2* 9.0 8.1*   GFR Estimated Creatinine Clearance: 72.4 mL/min (A) (by C-G formula based on SCr of 1.08 mg/dL (H)). Liver Function Tests: Recent Labs  Lab 10/31/17 1912 11/01/17 0250 11/02/17 0549 11/04/17 1242 11/05/17 0359  AST 144* 336* 207* 50* 34  ALT 79* 240* 378* 170* 124*  ALKPHOS 105 109 138* 106 92  BILITOT 1.5* 1.9* 1.2 0.9 0.8  PROT 7.3 6.3* 6.5 7.3 6.9  ALBUMIN 3.8 3.0* 3.1* 3.6 3.1*   Recent Labs  Lab 10/31/17 1912 11/04/17 1242  LIPASE 33 23   No results for input(s): AMMONIA in the last 168 hours. Coagulation profile No results for input(s): INR, PROTIME in the last 168 hours.  CBC: Recent Labs  Lab 10/31/17 1758 11/01/17 0250 11/01/17 1341 11/04/17 1242 11/05/17 0359  WBC 19.7*  12.8* 8.5 26.3* 20.2*  NEUTROABS 16.4*  --   --  22.9*  --   HGB 14.1 12.1 13.0 12.9 12.6  HCT 43.4 37.5 39.8 40.2 39.5  MCV 82.5 82.2 83.3 84.3 84.4  PLT 270 230 223 232 222   Cardiac Enzymes: No results for input(s): CKTOTAL, CKMB, CKMBINDEX, TROPONINI in the last 168 hours. BNP (last 3 results) No results for input(s): PROBNP in the last 8760 hours. CBG: No results for input(s): GLUCAP in the last 168 hours. D-Dimer: No results for input(s): DDIMER in the last 72 hours. Hgb A1c: No results for input(s): HGBA1C in the last 72 hours. Lipid Profile: No results for input(s): CHOL, HDL, LDLCALC, TRIG, CHOLHDL, LDLDIRECT in the last 72 hours. Thyroid function studies: No results for input(s): TSH, T4TOTAL, T3FREE, THYROIDAB in the last 72 hours.  Invalid  input(s): FREET3 Anemia work up: No results for input(s): VITAMINB12, FOLATE, FERRITIN, TIBC, IRON, RETICCTPCT in the last 72 hours. Sepsis Labs: Recent Labs  Lab 11/01/17 0250 11/01/17 1341 11/04/17 1241 11/04/17 1242 11/04/17 1502 11/05/17 0359  WBC 12.8* 8.5  --  26.3*  --  20.2*  LATICACIDVEN  --   --  1.44  --  1.40  --    Microbiology Recent Results (from the past 240 hour(s))  Urine culture     Status: Abnormal   Collection Time: 11/04/17 12:02 PM  Result Value Ref Range Status   Specimen Description   Final    URINE, CLEAN CATCH Performed at [facility], [address] [place], [place] [address]    Special Requests   Final    NONE Performed at [facility], [address] [place], [place] [address]    Culture MULTIPLE SPECIES PRESENT, SUGGEST RECOLLECTION (A)  Final   Report Status 11/05/2017 FINAL  Final  Urine Culture     Status: None (Preliminary result)   Collection Time: 11/04/17  8:38 PM  Result Value Ref Range Status   Specimen Description CYSTOSCOPY  Final   Special Requests URINE, RANDOM  Final   Culture   Final    CULTURE REINCUBATED FOR BETTER GROWTH Performed at [facility], [address] [place], [place] [address]    Report Status PENDING  Incomplete     Medications:   . propranolol ER  120 mg Oral QHS   Continuous Infusions: . sodium chloride 125 mL/hr at 11/06/17 0633  . cefTRIAXone (ROCEPHIN)  IV       LOS: 2 days   Marinda Elk  [facility] Pager [phone]  *Please refer to [email or web address], password [email or web address] to get updated schedule on who will round on this patient, as hospitalists switch teams weekly. If 7PM-7AM, please contact night-coverage at [email or web address], password [email or web address] for any overnight needs.  11/06/2017, 10:13 AM

## 2017-11-06 NOTE — Progress Notes (Signed)
2 Days Post-Op Subjective: Patient reports she feels better. She felt fever and chills this AM with nausea. T 100.7. Now AF.   Objective: Vital signs in last 24 hours: Temp:  [98.6 F (37 C)-100.7 F (38.2 C)] 100.7 F (38.2 C) (05/13 0623) Pulse Rate:  [73-81] 81 (05/13 0623) Resp:  [16-18] 16 (05/13 0623) BP: (122-137)/(75-87) 137/87 (05/13 0623) SpO2:  [100 %] 100 % (05/13 0623)  Intake/Output from previous day: 05/12 0701 - 05/13 0700 In: 600 [P.O.:600] Out: -  Intake/Output this shift: Total I/O In: 4295.3 [P.O.:237; I.V.:3958.3; IV Piggyback:100] Out: -   Physical Exam:  NAD Watching tv abd - soft, NT Ext - no CCE  Lab Results: Recent Labs    11/04/17 1242 11/05/17 0359  HGB 12.9 12.6  HCT 40.2 39.5   BMET Recent Labs    11/04/17 1242 11/05/17 0359  NA 140 140  K 3.7 4.3  CL 106 107  CO2 24 21*  GLUCOSE 105* 153*  BUN 14 13  CREATININE 1.33* 1.08*  CALCIUM 9.0 8.1*   No results for input(s): LABPT, INR in the last 72 hours. No results for input(s): LABURIN in the last 72 hours. Results for orders placed or performed during the hospital encounter of 11/04/17  Urine culture     Status: Abnormal   Collection Time: 11/04/17 12:02 PM  Result Value Ref Range Status   Specimen Description   Final    URINE, CLEAN CATCH Performed at [facility], [address] [place], [place] [address]    Special Requests   Final    NONE Performed at [facility], [address] [place], [place] [address]    Culture MULTIPLE SPECIES PRESENT, SUGGEST RECOLLECTION (A)  Final   Report Status 11/05/2017 FINAL  Final  Urine Culture     Status: None (Preliminary result)   Collection Time: 11/04/17  8:38 PM  Result Value Ref Range Status   Specimen Description CYSTOSCOPY  Final   Special Requests URINE, RANDOM  Final   Culture   Final    CULTURE REINCUBATED FOR BETTER GROWTH Performed at [facility], [address] [place], [place] [address]    Report Status PENDING  Incomplete    Studies/Results: Dg Abdomen 1 View  Result Date: 11/04/2017 CLINICAL DATA:  LEFT flank pain since yesterday, chills, nausea, vomiting, aching, multiple prior kidney stones EXAM: ABDOMEN - 1 VIEW COMPARISON:  CT abdomen and pelvis 10/07/2017 FINDINGS: Surgical clips RIGHT upper quadrant likely cholecystectomy. 7 mm diameter calculus projects over expected position of the LEFT ureteropelvic junction; previously this was located at the lower pole of the LEFT kidney. No additional urinary tract calcifications identified. Prominent stool RIGHT colon through proximal transverse colon. Small bowel gas pattern unremarkable. No acute osseous findings. IMPRESSION: 7 mm diameter calculus projects over the LEFT ureteropelvic junction, or as previously this was located at the inferior pole of the LEFT kidney. Electronically Signed   By: Ulyses Southward M.D.   On: 11/04/2017 13:20   US Renal  Result Date: 11/04/2017 CLINICAL DATA:  31 year old female with left flank pain, nausea, decreased urine output. Left UPJ calculus suspected on radiographs today. EXAM: RENAL / URINARY TRACT ULTRASOUND COMPLETE COMPARISON:  Abdominal radiographs 1250 hours today. CT Abdomen and Pelvis 10/07/2017. FINDINGS: Right Kidney: Length: 12.2 centimeters. Lobulated cortex due to multifocal areas of cortical scarring redemonstrated. The residual cortical echogenicity remains normal. No hydronephrosis or right renal lesion. Left Kidney: Length: 12.1 centimeters.  Areas of upper and lower pole cortical scarring redemonstrated. Cortical echogenicity remains normal. There is enlargement of the left upper pole calyx (image 19), and while initially the left renal pelvis does not seem dilated, I suspect that the remainder of the left renal collecting system is enlarged but contains echogenic debris (image 20). Nephrolithiasis is not evident by ultrasound. Bladder: Decompressed.  IMPRESSION: 1. Probable acute left hydronephrosis with echogenic debris or blood throughout the left renal collecting system. Follow-up noncontrast CT Abdomen and Pelvis would be most valuable. 2. Underlying chronic bilateral renal scarring. No right hydronephrosis. Electronically Signed   By: Odessa Fleming M.D.   On: 11/04/2017 14:37   Dg Chest Port 1 View  Result Date: 11/04/2017 CLINICAL DATA:  Left-sided abdominal pain and chest pain for 2 days EXAM: PORTABLE CHEST 1 VIEW COMPARISON:  None. FINDINGS: The heart size and mediastinal contours are within normal limits. Both lungs are clear. The visualized skeletal structures are unremarkable. IMPRESSION: No active disease. Electronically Signed   By: Alcide Clever M.D.   On: 11/04/2017 17:29   Dg C-arm 1-60 Min-no Report  Result Date: 11/04/2017 Fluoroscopy was utilized by the requesting physician.  No radiographic interpretation.   I reviewed ct a/p, [place], kub and RGP images.   Assessment/Plan: uti - typically her urine cx's grow mixed growth. A cathed specimen from OR is pending. Appreciate hospitalist care.   Left UPJ stone - this stone was noted in the [facility] on CT Apr 2019 and then at left UPJ on KUB and hydro on [place], s/p urgent left ureteral stent 11/04/2017. Discussed with patient I discussed with the patient the nature, potential benefits, risks and alternatives to cystoscopy, left ureteroscopy, laser lithotripsy, stent exchange,  including side effects of the proposed treatment, the likelihood of the patient achieving the goals of the procedure, and any potential problems that might occur during the procedure or recuperation. All questions answered. Patient elects to proceed. This will be arranged in 1 - 3 weeks.     LOS: 2 days   Jerilee Field 11/06/2017, 12:52 PM

## 2017-11-06 NOTE — Anesthesia Postprocedure Evaluation (Signed)
Anesthesia Post Note  Patient: Lauren Cardenas  Procedure(s) Performed: CYSTOSCOPY WITH RETROGRADE AND LEFT STENT PLACEMENT (Left Ureter)     Patient location during evaluation: PACU Anesthesia Type: General Level of consciousness: sedated and patient cooperative Pain management: pain level controlled Vital Signs Assessment: post-procedure vital signs reviewed and stable Respiratory status: spontaneous breathing Cardiovascular status: stable Anesthetic complications: no    Last Vitals:  Vitals:   11/05/17 2114 11/06/17 0623  BP: 122/75 137/87  Pulse: 73 81  Resp: 18 16  Temp: 37 C (!) 38.2 C  SpO2: 100% 100%    Last Pain:  Vitals:   11/06/17 0634  TempSrc:   PainSc: 4                  Lewie Loron

## 2017-11-07 LAB — CBC WITH DIFFERENTIAL/PLATELET
BASOS PCT: 0 %
Basophils Absolute: 0 10*3/uL (ref 0.0–0.1)
EOS ABS: 0.1 10*3/uL (ref 0.0–0.7)
EOS PCT: 1 %
HCT: 35.2 % — ABNORMAL LOW (ref 36.0–46.0)
Hemoglobin: 11.3 g/dL — ABNORMAL LOW (ref 12.0–15.0)
Lymphocytes Relative: 12 %
Lymphs Abs: 1.8 10*3/uL (ref 0.7–4.0)
MCH: 26.7 pg (ref 26.0–34.0)
MCHC: 32.1 g/dL (ref 30.0–36.0)
MCV: 83.2 fL (ref 78.0–100.0)
MONO ABS: 1.9 10*3/uL — AB (ref 0.1–1.0)
MONOS PCT: 12 %
NEUTROS PCT: 75 %
Neutro Abs: 11.9 10*3/uL — ABNORMAL HIGH (ref 1.7–7.7)
Platelets: 264 10*3/uL (ref 150–400)
RBC: 4.23 MIL/uL (ref 3.87–5.11)
RDW: 14.1 % (ref 11.5–15.5)
WBC: 15.7 10*3/uL — ABNORMAL HIGH (ref 4.0–10.5)

## 2017-11-07 LAB — BASIC METABOLIC PANEL
Anion gap: 12 (ref 5–15)
BUN: 8 mg/dL (ref 6–20)
CALCIUM: 8.4 mg/dL — AB (ref 8.9–10.3)
CO2: 23 mmol/L (ref 22–32)
Chloride: 105 mmol/L (ref 101–111)
Creatinine, Ser: 0.87 mg/dL (ref 0.44–1.00)
GFR calc Af Amer: >60 mL/min (ref >60)
Glucose, Bld: 91 mg/dL (ref 65–99)
Potassium: 3.1 mmol/L — ABNORMAL LOW (ref 3.5–5.1)
SODIUM: 140 mmol/L (ref 135–145)

## 2017-11-07 MED ORDER — VANCOMYCIN HCL 10 G IV SOLR
1500.0000 mg | Freq: Once | INTRAVENOUS | Status: AC
  Administered 2017-11-07: 1500 mg via INTRAVENOUS
  Filled 2017-11-07: qty 1500

## 2017-11-07 MED ORDER — VANCOMYCIN HCL 10 G IV SOLR
1250.0000 mg | INTRAVENOUS | Status: DC
  Administered 2017-11-08: 1250 mg via INTRAVENOUS
  Filled 2017-11-07: qty 1250

## 2017-11-07 MED ORDER — CIPROFLOXACIN IN D5W 400 MG/200ML IV SOLN: 400.0000 mg | Freq: Two times a day (BID) | INTRAVENOUS | Status: DC

## 2017-11-07 NOTE — Progress Notes (Signed)
Pharmacy Antibiotic Note  Lauren Cardenas is a 32 y.o. female admitted on 11/04/2017 with UTI.  Pharmacy has been consulted for vancomycin dosing (add on Staph coverage to Ceftriaxone pending culture results).  Tm 103.3 WBC 15.7 SCr 0.87  Plan:  Continue Ceftriaxone 1g IV q24h per MD.  Vancomycin 1500 mg IV x1 then, 1250 mg IV q24h.   Check levels if remains on vancomycin > 3-4 days.  Goal AUC 400-500.  Follow up renal fxn, culture results, and clinical course.  F/u ability to de-escalate antibiotics.   Height:  (157.5 cm) Weight: 175 lb 12.8 oz (79.7 kg) IBW/kg (Calculated) : 50.1  Temp (24hrs), Avg:101.5 F (38.6 C), Min:99.7 F (37.6 C), Max:103.3 F (39.6 C)  Recent Labs  Lab 10/31/17 1758 11/01/17 0250 11/01/17 1341 11/04/17 1241 11/04/17 1242 11/04/17 1502 11/05/17 0359  WBC 19.7* 12.8* 8.5  --  26.3*  --  20.2*  CREATININE 0.77 0.64  --   --  1.33*  --  1.08*  LATICACIDVEN  --   --   --  1.44  --  1.40  --     Estimated Creatinine Clearance: 72.4 mL/min (A) (by C-G formula based on SCr of 1.08 mg/dL (H)).    Allergies  Allergen Reactions  . Sudafed [Pseudoephedrine Hcl] Shortness Of Breath  . Amoxicillin Hives    ++ got ceftriaxone in the past++ Has patient had a PCN reaction causing immediate rash, facial/tongue/throat swelling, SOB or lightheadedness with hypotension: Yes Has patient had a PCN reaction causing severe rash involving mucus membranes or skin necrosis: No Has patient had a PCN reaction that required hospitalization: No Has patient had a PCN reaction occurring within the last 10 years: Yes If all of the above answers are "NO", then may proceed with Cephalosporin use.     Antimicrobials this admission: 5/11 Cipro >> 5/14 5/13 Ceftriaxone >>  5/14 Vancomycin >>   Dose adjustments this admission:   Microbiology results: 5/11 UCx (clean catch): multiple species 5/11 UCx (cystoscopy): >100k unidentified organism  Thank you for  allowing pharmacy to be a part of this patient's care.  Lynann Beaver PharmD, BCPS Pager [phone] 11/07/2017 9:16 AM

## 2017-11-07 NOTE — Progress Notes (Addendum)
[facility] PROGRESS NOTE    Progress Note  Lauren Cardenas  ZOX:[id number] DOB: [birth year] DOA: 11/04/2017 PCP: Elenora Gamma, MD     Brief Narrative:   Lauren Cardenas is an 32 y.o. female past medical history of pyelonephritis, and nephrolithiasis with right-sided hydronephrosis and a stent placed in 2017 followed by removal with recent laparoscopic cholecystectomy on 10/26/2017 comes in with sudden onset of abdominal pain which started the day prior to admission.  She had an MRCP and MRI that showed a left-sided ureteral stone  Assessment/Plan:   Sepsis (HCC) due to Acute pyelonephritis with left-sided obstructed uropathy secondary to urethral stone: She continues to have fevers despite IV Rocephin. Culture data grew more than 100,000 colonies awaiting speciation's and sensitivities. I have called the lab and they are thinking it may be a gram-positive coag negative, would add IV vancomycin.   Leukocytosis Likely due to infectious etiology. Is improving.  Acute kidney injury: Likely prerenal, baseline creatinine less than 1, improving nicely basic metabolic panel pending this morning.  Elevated LFTs: ALT more than AST, will need further follow-up as an outpatient but they are trending down compared to 11/02/2017.  Essential hypertension: Stable continue current management.  DVT prophylaxis: lovenox Family Communication:none Disposition Plan/Barrier to D/C: Once cultures back Code Status:     Code Status Orders  (From admission, onward)        Start     Ordered   11/04/17 1640  Full code  Continuous     11/04/17 1643    Code Status History    Date Active Date Inactive Code Status Order ID Comments User Context   11/01/2017 0049 11/02/2017 1426 Full Code [id number]  Manus Rudd, MD ED        IV Access:    Peripheral IV   Procedures and diagnostic studies:   No results found.   Medical Consultants:    None.  Anti-Infectives:   IV  ciprofloxacin  Subjective:    Lauren Cardenas she is enjoying her diet feels lousy especially overnight.  Objective:    Vitals:   11/06/17 2120 11/06/17 2320 11/07/17 0534 11/07/17 0806  BP: 113/70  111/77   Pulse: 94  (!) 110   Resp:   18   Temp:  (!) 100.5 F (38.1 C) (!) 100.5 F (38.1 C) (!) 103 F (39.4 C)  TempSrc:  Oral Oral   SpO2: 99%  100%   Weight:      Height:        Intake/Output Summary (Last 24 hours) at 11/07/2017 0838 Last data filed at 11/07/2017 0534 Gross per 24 hour  Intake 7732.83 ml  Output -  Net 7732.83 ml   Filed Weights   11/04/17 1112 11/04/17 1737  Weight: 79.4 kg (175 lb) 79.7 kg (175 lb 12.8 oz)    Exam: General exam: In no acute distress. Respiratory system: Good air movement and clear to auscultation. Cardiovascular system: S1 & S2 heard, RRR.  Gastrointestinal system: Abdomen is nondistended, soft and nontender.  Central nervous system: Alert and oriented. No focal neurological deficits. Extremities: No pedal edema. Skin: No rashes, lesions or ulcers Psychiatry: Judgement and insight appear normal. Mood & affect appropriate.    Data Reviewed:    Labs: Basic Metabolic Panel: Recent Labs  Lab 10/31/17 1758 11/01/17 0250 11/04/17 1242 11/05/17 0359  NA 142 139 140 140  K 4.1 3.7 3.7 4.3  CL 106 109 106 107  CO2 21*  GLUCOSE  161* 126* 105* 153*  BUN CREATININE 0.77 0.64 1.33* 1.08*  CALCIUM 9.5 8.2* 9.0 8.1*   GFR Estimated Creatinine Clearance: 72.4 mL/min (A) (by C-G formula based on SCr of 1.08 mg/dL (H)). Liver Function Tests: Recent Labs  Lab 10/31/17 1912 11/01/17 0250 11/02/17 0549 11/04/17 1242 11/05/17 0359  AST 144* 336* 207* 50* 34  ALT 79* 240* 378* 170* 124*  ALKPHOS 105 109 138* 106 92  BILITOT 1.5* 1.9* 1.2 0.9 0.8  PROT 7.3 6.3* 6.5 7.3 6.9  ALBUMIN 3.8 3.0* 3.1* 3.6 3.1*   Recent Labs  Lab 10/31/17 1912 11/04/17 1242  LIPASE 33 23   No results for input(s):  AMMONIA in the last 168 hours. Coagulation profile No results for input(s): INR, PROTIME in the last 168 hours.  CBC: Recent Labs  Lab 10/31/17 1758 11/01/17 0250 11/01/17 1341 11/04/17 1242 11/05/17 0359  WBC 19.7* 12.8* 8.5 26.3* 20.2*  NEUTROABS 16.4*  --   --  22.9*  --   HGB 14.1 12.1 13.0 12.9 12.6  HCT 43.4 37.5 39.8 40.2 39.5  MCV 82.5 82.2 83.3 84.3 84.4  PLT 270 230 223 232 222   Cardiac Enzymes: No results for input(s): CKTOTAL, CKMB, CKMBINDEX, TROPONINI in the last 168 hours. BNP (last 3 results) No results for input(s): PROBNP in the last 8760 hours. CBG: No results for input(s): GLUCAP in the last 168 hours. D-Dimer: No results for input(s): DDIMER in the last 72 hours. Hgb A1c: No results for input(s): HGBA1C in the last 72 hours. Lipid Profile: No results for input(s): CHOL, HDL, LDLCALC, TRIG, CHOLHDL, LDLDIRECT in the last 72 hours. Thyroid function studies: No results for input(s): TSH, T4TOTAL, T3FREE, THYROIDAB in the last 72 hours.  Invalid input(s): FREET3 Anemia work up: No results for input(s): VITAMINB12, FOLATE, FERRITIN, TIBC, IRON, RETICCTPCT in the last 72 hours. Sepsis Labs: Recent Labs  Lab 11/01/17 0250 11/01/17 1341 11/04/17 1241 11/04/17 1242 11/04/17 1502 11/05/17 0359  WBC 12.8* 8.5  --  26.3*  --  20.2*  LATICACIDVEN  --   --  1.44  --  1.40  --    Microbiology Recent Results (from the past 240 hour(s))  Urine culture     Status: Abnormal   Collection Time: 11/04/17 12:02 PM  Result Value Ref Range Status   Specimen Description   Final    URINE, CLEAN CATCH Performed at [facility], [address], [place], [place] [address]    Special Requests   Final    NONE Performed at [facility], [address], [place], [place] [address]    Culture MULTIPLE SPECIES PRESENT, SUGGEST RECOLLECTION (A)  Final   Report Status 11/05/2017 FINAL  Final  Urine Culture     Status: Abnormal  (Preliminary result)   Collection Time: 11/04/17  8:38 PM  Result Value Ref Range Status   Specimen Description CYSTOSCOPY  Final   Special Requests URINE, RANDOM  Final   Culture (A)  Final    >=100,000 COLONIES/mL UNIDENTIFIED ORGANISM Performed at [facility], [address], [place], [place] [address]    Report Status PENDING  Incomplete     Medications:   . propranolol ER  120 mg Oral QHS   Continuous Infusions: . sodium chloride 125 mL/hr at 11/07/17 0339  . cefTRIAXone (ROCEPHIN)  IV Stopped (11/06/17 1215)     LOS: 3 days   Marinda Elk  [facility] Pager  [phone]  *Please refer to [email or web address], password [email or web address] to get updated schedule on who will round on this patient, as hospitalists switch teams weekly. If 7PM-7AM, please contact night-coverage at [email or web address], password [email or web address] for any overnight needs.  11/07/2017, 8:38 AM

## 2017-11-08 DIAGNOSIS — N10 Acute tubulo-interstitial nephritis: Secondary | ICD-10-CM

## 2017-11-08 DIAGNOSIS — N200 Calculus of kidney: Secondary | ICD-10-CM

## 2017-11-08 DIAGNOSIS — N201 Calculus of ureter: Secondary | ICD-10-CM

## 2017-11-08 DIAGNOSIS — A419 Sepsis, unspecified organism: Principal | ICD-10-CM

## 2017-11-08 DIAGNOSIS — D72829 Elevated white blood cell count, unspecified: Secondary | ICD-10-CM

## 2017-11-08 LAB — BASIC METABOLIC PANEL
Anion gap: 11 (ref 5–15)
BUN: 9 mg/dL (ref 6–20)
CHLORIDE: 106 mmol/L (ref 101–111)
CO2: 24 mmol/L (ref 22–32)
CREATININE: 0.83 mg/dL (ref 0.44–1.00)
Calcium: 9 mg/dL (ref 8.9–10.3)
GFR calc non Af Amer: >60 mL/min (ref >60)
Glucose, Bld: 103 mg/dL — ABNORMAL HIGH (ref 65–99)
POTASSIUM: 3.2 mmol/L — AB (ref 3.5–5.1)
SODIUM: 141 mmol/L (ref 135–145)

## 2017-11-08 LAB — URINE CULTURE

## 2017-11-08 MED ORDER — CEPHALEXIN 500 MG PO CAPS
500.0000 mg | ORAL_CAPSULE | Freq: Three times a day (TID) | ORAL | Status: DC
  Administered 2017-11-08 – 2017-11-09 (×3): 500 mg via ORAL
  Filled 2017-11-08 (×3): qty 1

## 2017-11-08 MED ORDER — POTASSIUM CHLORIDE CRYS ER 20 MEQ PO TBCR
40.0000 meq | EXTENDED_RELEASE_TABLET | Freq: Two times a day (BID) | ORAL | Status: AC
  Administered 2017-11-08 (×2): 40 meq via ORAL
  Filled 2017-11-08 (×2): qty 2

## 2017-11-08 MED ORDER — MAGNESIUM SULFATE 2 GM/50ML IV SOLN
2.0000 g | Freq: Once | INTRAVENOUS | Status: AC
  Administered 2017-11-08: 2 g via INTRAVENOUS
  Filled 2017-11-08: qty 50

## 2017-11-08 NOTE — Progress Notes (Signed)
PROGRESS NOTE  Lauren Cardenas UJW:[id number] DOB: 21-Apr-1986 DOA: 11/04/2017 PCP: Lauren Gamma, MD  HPI/Recap of past 24 hours: Lauren Cardenas is an 32 y.o. female past medical history of pyelonephritis, and nephrolithiasis with right-sided hydronephrosis and a stent placed in 2017 followed by removal with recent laparoscopic cholecystectomy on 10/26/2017 comes in with sudden onset of abdominal pain which started the day prior to admission.  She had an MRCP and MRI that showed a left-sided ureteral stone  11/08/2017: Patient seen and examined at bedside.  She reports feels better.  Denies dysuria or flank pain.  White count remains elevated.  T-max overnight 100.4.   Assessment/Plan: Active Problems:   Acute pyelonephritis   Sepsis (HCC)   Leukocytosis   Ureterolithiasis   Pyelonephritis  Acute pyelonephritis WBC is trending down T-max 100.4 benign Continue antibiotics Urine culture available P.o. Keflex started Discontinue IV fluid Continue to monitor fever curve Continue to monitor WBC Obtain CBC in the morning  Migraines Stable Continue home meds  Hypertension Blood pressures well controlled  Hypokalemia Potassium 3.2 Repleted with p.o. KCl and IV magnesium 2 g once  Chronic anxiety Continue bupropion   Code Status: Full code  Family Communication: None at bedside  Disposition Plan: Possibly tomorrow discharge to home if no events overnight   Consultants:  None  Procedures:  None  Antimicrobials:  Keflex  DVT prophylaxis: SCDs   Objective: Vitals:   11/07/17 2132 11/07/17 2133 11/08/17 0034 11/08/17 0446  BP: (!) 148/90 132/80  118/86  Pulse: 86 83  66  Resp: 20     Temp: (!) 100.4 F (38 C)  99.7 F (37.6 C) 98.5 F (36.9 C)  TempSrc: Oral  Oral Oral  SpO2: 98%   100%  Weight:      Height:        Intake/Output Summary (Last 24 hours) at 11/08/2017 1733 Last data filed at 11/08/2017 1600 Gross per 24 hour  Intake  3056.67 ml  Output -  Net 3056.67 ml   Filed Weights   11/04/17 1112 11/04/17 1737  Weight: 79.4 kg (175 lb) 79.7 kg (175 lb 12.8 oz)    Exam:  . General: 32 y.o. year-old female well developed well nourished in no acute distress.  Alert and oriented x3. . Cardiovascular: Regular rate and rhythm with no rubs or gallops.  No thyromegaly or JVD noted.   Marland Kitchen Respiratory: Clear to auscultation with no wheezes or rales. Good inspiratory effort. . Abdomen: Soft nontender nondistended with normal bowel sounds x4 quadrants. . Musculoskeletal: No lower extremity edema. 2/4 pulses in all 4 extremities. . Skin: No ulcerative lesions noted or rashes, . Psychiatry: Mood is appropriate for condition and setting   Data Reviewed: CBC: Recent Labs  Lab 11/04/17 1242 11/05/17 0359 11/07/17 1000  WBC 26.3* 20.2* 15.7*  NEUTROABS 22.9*  --  11.9*  HGB 12.9 12.6 11.3*  HCT 40.2 39.5 35.2*  MCV 84.3 84.4 83.2  PLT 232 222 264   Basic Metabolic Panel: Recent Labs  Lab 11/04/17 1242 11/05/17 0359 11/07/17 1000 11/08/17 0327  NA 140 140 140 141  K 3.7 4.3 3.1* 3.2*  CL 106 107 105 106  CO2 24 21* 23 24  GLUCOSE 105* 153* 91 103*  BUN CREATININE 1.33* 1.08* 0.87 0.83  CALCIUM 9.0 8.1* 8.4* 9.0   GFR: Estimated Creatinine Clearance: 94.2 mL/min (by C-G formula based on SCr of 0.83 mg/dL). Liver Function Tests: Recent Labs  Lab 11/02/17 0549 11/04/17 1242 11/05/17 0359  AST 207* 50* 34  ALT 378* 170* 124*  ALKPHOS 138* 106 92  BILITOT 1.2 0.9 0.8  PROT 6.5 7.3 6.9  ALBUMIN 3.1* 3.6 3.1*   Recent Labs  Lab 11/04/17 1242  LIPASE 23   No results for input(s): AMMONIA in the last 168 hours. Coagulation Profile: No results for input(s): INR, PROTIME in the last 168 hours. Cardiac Enzymes: No results for input(s): CKTOTAL, CKMB, CKMBINDEX, TROPONINI in the last 168 hours. BNP (last 3 results) No results for input(s): PROBNP in the last 8760 hours. HbA1C: No  results for input(s): HGBA1C in the last 72 hours. CBG: No results for input(s): GLUCAP in the last 168 hours. Lipid Profile: No results for input(s): CHOL, HDL, LDLCALC, TRIG, CHOLHDL, LDLDIRECT in the last 72 hours. Thyroid Function Tests: No results for input(s): TSH, T4TOTAL, FREET4, T3FREE, THYROIDAB in the last 72 hours. Anemia Panel: No results for input(s): VITAMINB12, FOLATE, FERRITIN, TIBC, IRON, RETICCTPCT in the last 72 hours. Urine analysis:    Component Value Date/Time   COLORURINE YELLOW 11/04/2017 1159   APPEARANCEUR HAZY (A) 11/04/2017 1159   APPEARANCEUR Hazy (A) 10/06/2017 0900   LABSPEC 1.025 11/04/2017 1159   PHURINE 5.0 11/04/2017 1159   GLUCOSEU NEGATIVE 11/04/2017 1159   HGBUR MODERATE (A) 11/04/2017 1159   BILIRUBINUR NEGATIVE 11/04/2017 1159   BILIRUBINUR Positive (A) 10/06/2017 0900   KETONESUR 20 (A) 11/04/2017 1159   PROTEINUR 30 (A) 11/04/2017 1159   UROBILINOGEN negative 06/12/2014 1536   UROBILINOGEN 1.0 03/20/2012 2124   NITRITE NEGATIVE 11/04/2017 1159   LEUKOCYTESUR TRACE (A) 11/04/2017 1159   LEUKOCYTESUR 1+ (A) 10/06/2017 0900   Sepsis Labs: (procalcitonin:4,lacticidven:4)  ) Recent Results (from the past 240 hour(s))  Urine culture     Status: Abnormal   Collection Time: 11/04/17 12:02 PM  Result Value Ref Range Status   Specimen Description   Final    URINE, CLEAN CATCH Performed at [facility], [address] [place], [place] [address]    Special Requests   Final    NONE Performed at [facility], [address], [place], [place] [address]    Culture MULTIPLE SPECIES PRESENT, SUGGEST RECOLLECTION (A)  Final   Report Status 11/05/2017 FINAL  Final  Urine Culture     Status: Abnormal   Collection Time: 11/04/17  8:38 PM  Result Value Ref Range Status   Specimen Description CYSTOSCOPY  Final   Special Requests URINE, RANDOM  Final   Culture >=100,000 COLONIES/mL STAPHYLOCOCCUS  EPIDERMIDIS (A)  Final   Report Status 11/08/2017 FINAL  Final   Organism ID, Bacteria STAPHYLOCOCCUS EPIDERMIDIS (A)  Final      Susceptibility   Staphylococcus epidermidis - MIC*    CIPROFLOXACIN <=0.5 SENSITIVE Sensitive     ERYTHROMYCIN 0.5 SENSITIVE Sensitive     GENTAMICIN <=0.5 SENSITIVE Sensitive     OXACILLIN <=0.25 SENSITIVE Sensitive     TETRACYCLINE 4 SENSITIVE Sensitive     VANCOMYCIN 1 SENSITIVE Sensitive     TRIMETH/SULFA <=10 SENSITIVE Sensitive     CLINDAMYCIN <=0.25 SENSITIVE Sensitive     RIFAMPIN <=0.5 SENSITIVE Sensitive     Inducible Clindamycin NEGATIVE Sensitive     * >=100,000 COLONIES/mL STAPHYLOCOCCUS EPIDERMIDIS      Studies: No results found.  Scheduled Meds: . cephALEXin  500 mg Oral Q8H  . potassium chloride  40 mEq Oral BID  . propranolol ER  120 mg Oral QHS  Continuous Infusions: . sodium chloride 75 mL/hr at 11/08/17 1020     LOS: 4 days     Darlin Drop, MD [facility] Pager [phone]  If 7PM-7AM, please contact night-coverage [email or web address] Password [facility] 11/08/2017, 5:33 PM

## 2017-11-09 ENCOUNTER — Other Ambulatory Visit: Payer: Self-pay | Admitting: Urology

## 2017-11-09 LAB — CBC
HCT: 35.3 % — ABNORMAL LOW (ref 36.0–46.0)
Hemoglobin: 11.4 g/dL — ABNORMAL LOW (ref 12.0–15.0)
MCH: 27 pg (ref 26.0–34.0)
MCHC: 32.3 g/dL (ref 30.0–36.0)
MCV: 83.6 fL (ref 78.0–100.0)
PLATELETS: 309 10*3/uL (ref 150–400)
RBC: 4.22 MIL/uL (ref 3.87–5.11)
RDW: 13.7 % (ref 11.5–15.5)
WBC: 11.7 10*3/uL — ABNORMAL HIGH (ref 4.0–10.5)

## 2017-11-09 LAB — BASIC METABOLIC PANEL
Anion gap: 10 (ref 5–15)
BUN: 12 mg/dL (ref 6–20)
CALCIUM: 8.9 mg/dL (ref 8.9–10.3)
CO2: 26 mmol/L (ref 22–32)
CREATININE: 0.92 mg/dL (ref 0.44–1.00)
Chloride: 109 mmol/L (ref 101–111)
Glucose, Bld: 106 mg/dL — ABNORMAL HIGH (ref 65–99)
Potassium: 4.1 mmol/L (ref 3.5–5.1)
SODIUM: 145 mmol/L (ref 135–145)

## 2017-11-09 MED ORDER — CEPHALEXIN 500 MG PO CAPS: 500.0000 mg | ORAL_CAPSULE | Freq: Three times a day (TID) | ORAL | 0 refills | Status: DC

## 2017-11-09 NOTE — Discharge Instructions (Signed)
Pyelonephritis, Adult Pyelonephritis is a kidney infection. The kidneys are organs that help clean your blood by moving waste out of your blood and into your pee (urine). This infection can happen quickly, or it can last for a long time. In most cases, it clears up with treatment and does not cause other problems. Follow these instructions at home: Medicines  Take over-the-counter and prescription medicines only as told by your doctor.  Take your antibiotic medicine as told by your doctor. Do not stop taking the medicine even if you start to feel better. General instructions  Drink enough fluid to keep your pee clear or pale yellow.  Avoid caffeine, tea, and carbonated drinks.  Pee (urinate) often. Avoid holding in pee for long periods of time.  Pee before and after sex.  After pooping (having a bowel movement), women should wipe from front to back. Use each tissue only once.  Keep all follow-up visits as told by your doctor. This is important. Contact a doctor if:  You do not feel better after 2 days.  Your symptoms get worse.  You have a fever. Get help right away if:  You cannot take your medicine or drink fluids as told.  You have chills and shaking.  You throw up (vomit).  You have very bad pain in your side (flank) or back.  You feel very weak or you pass out (faint). This information is not intended to replace advice given to you by your health care provider. Make sure you discuss any questions you have with your health care provider. Document Released: 07/21/2004 Document Revised: 11/19/2015 Document Reviewed: 10/06/2014 Elsevier Interactive Patient Education  2018 [employer]  

## 2017-11-09 NOTE — Discharge Summary (Signed)
Discharge Summary  Lauren Cardenas ZOX:[id number] DOB: Jun 08, 1986  PCP: Lauren Gamma, MD  Admit date: 11/04/2017 Discharge date: 11/09/2017  Time spent: 25 minutes  Recommendations for Outpatient Follow-up:  1. Follow up with PCP 2. Take your medications as prescribed   Discharge Diagnoses:  Active Hospital Problems   Diagnosis Date Noted  . Ureterolithiasis 11/04/2017  . Pyelonephritis 11/04/2017  . Acute pyelonephritis 09/27/2015  . Sepsis (HCC) 09/27/2015  . Leukocytosis     Resolved Hospital Problems  No resolved problems to display.    Discharge Condition: Stable   Diet recommendation: Resume previous diet  Vitals:   11/08/17 2243 11/09/17 0601  BP: 122/88 120/83  Pulse: 78 71  Resp: 16 16  Temp: 99.2 F (37.3 C) 98.4 F (36.9 C)  SpO2: 99% 99%    History of present illness:  Lauren D Breweris an 32 y.o.femalepast medical history of pyelonephritis, and nephrolithiasis with right-sided hydronephrosis and a stent placed in 2017 followed by removal with recent laparoscopic cholecystectomy on 10/26/2017 comes in with sudden onset of abdominal pain which started the day prior to admission. She had an MRCP and MRI that showed a left-sided ureteral stone  11/08/2017: Patient seen and examined at bedside.  She reports feels better.  Denies dysuria or flank pain.  White count remains elevated. T-max overnight 100.4.  11/09/17: No new complaints.  On the day of discharge, patient was hemodynamically stable.    Hospital Course:  Active Problems:   Acute pyelonephritis   Sepsis (HCC)   Leukocytosis   Ureterolithiasis   Pyelonephritis  Acute pyelonephritis, resolving Continue antibiotic keflex Urine culture with sensitivities for Keflex  Migraines Stable Continue home meds  Hypertension Blood pressures well controlled  Hypokalemia, resolved Repleted with p.o. KCl and IV magnesium 2 g once  Chronic anxiety Continue  bupropion   Procedures:  None  Consultations:  None  Discharge Exam: BP 120/83 (BP Location: Right Arm)   Pulse 71   Temp 98.4 F (36.9 C) (Oral)   Resp 16   Ht  (1.575 m)   Wt 79.7 kg (175 lb 12.8 oz)   SpO2 99%   BMI 32.15 kg/m  . General: 32 y.o. year-old female well developed well nourished in no acute distress.  Alert and oriented x3. . Cardiovascular: Regular rate and rhythm with no rubs or gallops.  No thyromegaly or JVD noted.   Marland Kitchen Respiratory: Clear to auscultation with no wheezes or rales. Good inspiratory effort. . Abdomen: Soft nontender nondistended with normal bowel sounds x4 quadrants. . Musculoskeletal: No lower extremity edema. 2/4 pulses in all 4 extremities. . Skin: No ulcerative lesions noted or rashes, . Psychiatry: Mood is appropriate for condition and setting  Discharge Instructions You were cared for by a hospitalist during your hospital stay. If you have any questions about your discharge medications or the care you received while you were in the hospital after you are discharged, you can call the unit and asked to speak with the hospitalist on call if the hospitalist that took care of you is not available. Once you are discharged, your primary care physician will handle any further medical issues. Please note that NO REFILLS for any discharge medications will be authorized once you are discharged, as it is imperative that you return to your primary care physician (or establish a relationship with a primary care physician if you do not have one) for your aftercare needs so that they can reassess your need for medications and monitor  your lab values.   Allergies as of 11/09/2017      Reactions   Sudafed [pseudoephedrine Hcl] Shortness Of Breath   Amoxicillin Hives   ++ got ceftriaxone in the past++ Has patient had a PCN reaction causing immediate rash, facial/tongue/throat swelling, SOB or lightheadedness with hypotension: Yes Has patient had a PCN  reaction causing severe rash involving mucus membranes or skin necrosis: No Has patient had a PCN reaction that required hospitalization: No Has patient had a PCN reaction occurring within the last 10 years: Yes If all of the above answers are "NO", then may proceed with Cephalosporin use.      Medication List    STOP taking these medications   acetaminophen 500 MG tablet Commonly known as:  TYLENOL   HYDROcodone-acetaminophen 7.5-325 MG tablet Commonly known as:  NORCO   ibuprofen 800 MG tablet Commonly known as:  ADVIL,MOTRIN   oxyCODONE 5 MG immediate release tablet Commonly known as:  Oxy IR/ROXICODONE     TAKE these medications   buPROPion 150 MG 12 hr tablet Commonly known as:  WELLBUTRIN SR Take 1 tablet (150 mg total) by mouth daily.   cephALEXin 500 MG capsule Commonly known as:  KEFLEX Take 1 capsule (500 mg total) by mouth 3 (three) times daily.   cyclobenzaprine 5 MG tablet Commonly known as:  FLEXERIL Take 1 tablet (5 mg total) by mouth 3 (three) times daily as needed for muscle spasms.   EQ ALLERGY RELIEF (CETIRIZINE) 10 MG tablet Generic drug:  cetirizine TAKE 1 TABLET BY MOUTH ONCE DAILY   fluticasone 50 MCG/ACT nasal spray Commonly known as:  FLONASE Place 2 sprays as needed into both nostrils for allergies or rhinitis.   metFORMIN 500 MG tablet Commonly known as:  GLUCOPHAGE Take 1 tablet (500 mg total) by mouth daily with breakfast.   promethazine 25 MG tablet Commonly known as:  PHENERGAN Take 1 tablet (25 mg total) as needed by mouth for nausea or vomiting.   propranolol ER 120 MG 24 hr capsule Commonly known as:  INDERAL LA Take 1 capsule (120 mg total) at bedtime by mouth.   rizatriptan 10 MG disintegrating tablet Commonly known as:  MAXALT-MLT Take 1 tablet (10 mg total) 3 (three) times daily as needed by mouth for migraine.   Vitamin D (Ergocalciferol) 50000 units Caps capsule Commonly known as:  DRISDOL Take 1 capsule (50,000  Units total) by mouth every 14 (fourteen) days.      Allergies  Allergen Reactions  . Sudafed [Pseudoephedrine Hcl] Shortness Of Breath  . Amoxicillin Hives    ++ got ceftriaxone in the past++ Has patient had a PCN reaction causing immediate rash, facial/tongue/throat swelling, SOB or lightheadedness with hypotension: Yes Has patient had a PCN reaction causing severe rash involving mucus membranes or skin necrosis: No Has patient had a PCN reaction that required hospitalization: No Has patient had a PCN reaction occurring within the last 10 years: Yes If all of the above answers are "NO", then may proceed with Cephalosporin use.       The results of significant diagnostics from this hospitalization (including imaging, microbiology, ancillary and laboratory) are listed below for reference.    Significant Diagnostic Studies: Dg Chest 2 View  Result Date: 10/31/2017 CLINICAL DATA:  32 year old female with chest pain radiating to the back since 1530 hours today. Laparoscopic cholecystectomy postoperative day 5. EXAM: CHEST - 2 VIEW COMPARISON:  CT Abdomen and Pelvis 10/07/2017. Chest radiographs 10/06/2017. FINDINGS: Low lung volumes. Mediastinal contours  remain normal. Visualized tracheal air column is within normal limits. No pneumothorax or pneumoperitoneum. No pulmonary edema, pleural effusion or confluent pulmonary opacity. Cholecystectomy clips in the right upper quadrant. Visible bowel gas pattern is within normal limits. No osseous abnormality identified. IMPRESSION: 1. Low lung volumes, otherwise no acute cardiopulmonary abnormality. 2. Cholecystectomy clips in the right upper quadrant. Visible bowel gas pattern within normal limits. Electronically Signed   By: Odessa Fleming M.D.   On: 10/31/2017 20:17   Dg Abdomen 1 View  Result Date: 11/04/2017 CLINICAL DATA:  LEFT flank pain since yesterday, chills, nausea, vomiting, aching, multiple prior kidney stones EXAM: ABDOMEN - 1 VIEW COMPARISON:   CT abdomen and pelvis 10/07/2017 FINDINGS: Surgical clips RIGHT upper quadrant likely cholecystectomy. 7 mm diameter calculus projects over expected position of the LEFT ureteropelvic junction; previously this was located at the lower pole of the LEFT kidney. No additional urinary tract calcifications identified. Prominent stool RIGHT colon through proximal transverse colon. Small bowel gas pattern unremarkable. No acute osseous findings. IMPRESSION: 7 mm diameter calculus projects over the LEFT ureteropelvic junction, or as previously this was located at the inferior pole of the LEFT kidney. Electronically Signed   By: Ulyses Southward M.D.   On: 11/04/2017 13:20   Ct Angio Chest Pe W And/or Wo Contrast  Result Date: 10/31/2017 CLINICAL DATA:  Shortness of breath with flank pain and back pain. EXAM: CT ANGIOGRAPHY CHEST WITH CONTRAST TECHNIQUE: Multidetector CT imaging of the chest was performed using the standard protocol during bolus administration of intravenous contrast. Multiplanar CT image reconstructions and MIPs were obtained to evaluate the vascular anatomy. CONTRAST:  ISOVUE-370 IOPAMIDOL (ISOVUE-370) INJECTION 76% COMPARISON:  Chest radiograph 10/31/2017 FINDINGS: Cardiovascular: Contrast injection is sufficient to demonstrate satisfactory opacification of the pulmonary arteries to the segmental level. There is no pulmonary embolus. The main pulmonary artery is within normal limits for size. There is a normal 3-vessel arch branching pattern without evidence of acute aortic syndrome. There is noaortic atherosclerosis. Heart size is normal, without pericardial effusion. Mediastinum/Nodes: No mediastinal, hilar or axillary lymphadenopathy. The visualized thyroid and thoracic esophageal course are unremarkable. Lungs/Pleura: No pulmonary nodules or masses. No pleural effusion or pneumothorax. No focal airspace consolidation. No focal pleural abnormality. Upper Abdomen: Contrast bolus timing is not  optimized for evaluation of the abdominal organs. Within this limitation, the visualized organs of the upper abdomen are normal. Musculoskeletal: No chest wall abnormality. No acute or significant osseous findings. Review of the MIP images confirms the above findings. IMPRESSION: No pulmonary embolus or other acute thoracic abnormality. Electronically Signed   By: Deatra Robinson M.D.   On: 10/31/2017 22:03   Nm Hepatobiliary Liver Func  Result Date: 11/01/2017 CLINICAL DATA:  RIGHT upper quadrant pain, fever, leukocytosis, nausea, post cholecystectomy, question bile leak versus CBD obstruction EXAM: NUCLEAR MEDICINE HEPATOBILIARY IMAGING TECHNIQUE: Sequential images of the abdomen were obtained out to 60 minutes following intravenous administration of radiopharmaceutical. RADIOPHARMACEUTICALS:  5.2 mCi Tc-13m  Choletec IV COMPARISON:  None; correlation CT abdomen and pelvis 10/07/2017 FINDINGS: Adequate clearance of tracer from bloodstream. Delayed excretion of tracer into common bile duct. Faint tracer within small bowel is visualized beginning at 30 minutes. Continued imaging for second hour reveals progressive tracer passage into small bowel loops consistent with a patent CBD. However prolonged hepatocellular retention of tracer is seen consistent with hepatocellular dysfunction. No perihepatic tracer accumulation is identified to suggest bile leak. IMPRESSION: Patent CBD without scintigraphic evidence of bile leak. Prolonged hepatocellular retention  of tracer consistent with hepatocellular dysfunction. Electronically Signed   By: Ulyses Southward M.D.   On: 11/01/2017 13:22   US Renal  Result Date: 11/04/2017 CLINICAL DATA:  32 year old female with left flank pain, nausea, decreased urine output. Left UPJ calculus suspected on radiographs today. EXAM: RENAL / URINARY TRACT ULTRASOUND COMPLETE COMPARISON:  Abdominal radiographs 1250 hours today. CT Abdomen and Pelvis 10/07/2017. FINDINGS: Right Kidney: Length:  12.2 centimeters. Lobulated cortex due to multifocal areas of cortical scarring redemonstrated. The residual cortical echogenicity remains normal. No hydronephrosis or right renal lesion. Left Kidney: Length: 12.1 centimeters. Areas of upper and lower pole cortical scarring redemonstrated. Cortical echogenicity remains normal. There is enlargement of the left upper pole calyx (image 19), and while initially the left renal pelvis does not seem dilated, I suspect that the remainder of the left renal collecting system is enlarged but contains echogenic debris (image 20). Nephrolithiasis is not evident by ultrasound. Bladder: Decompressed. IMPRESSION: 1. Probable acute left hydronephrosis with echogenic debris or blood throughout the left renal collecting system. Follow-up noncontrast CT Abdomen and Pelvis would be most valuable. 2. Underlying chronic bilateral renal scarring. No right hydronephrosis. Electronically Signed   By: Odessa Fleming M.D.   On: 11/04/2017 14:37   Mr 3d Recon At Scanner  Result Date: 11/01/2017 CLINICAL DATA:  Severe right upper quadrant abdominal pain with nausea. Laparoscopic cholecystectomy 10/26/2017. EXAM: MRI ABDOMEN WITHOUT AND WITH CONTRAST (INCLUDING MRCP) TECHNIQUE: Multiplanar multisequence MR imaging of the abdomen was performed both before and after the administration of intravenous contrast. Heavily T2-weighted images of the biliary and pancreatic ducts were obtained, and three-dimensional MRCP images were rendered by post processing. CONTRAST:  30mL MULTIHANCE GADOBENATE DIMEGLUMINE 529 MG/ML IV SOLN COMPARISON:  Nuclear medicine hepatobiliary scan 11/01/2017. Abdominopelvic CT 10/07/2017. FINDINGS: Lower chest: Suboptimal inspiration without suspicious findings at the visualized lung bases. Hepatobiliary: The liver demonstrates mild loss of signal on the gradient echo opposed phase images, consistent with steatosis. No focal hepatic lesion or abnormal enhancement seen. Expected  appearance of the cholecystectomy bed status post recent surgery. No significant fluid collection. There is no intra hepatic or significant extrahepatic biliary dilatation. The common hepatic duct measures 8 mm maximally. There is no evidence of choledocholithiasis. Pancreas: Unremarkable. No pancreatic ductal dilatation or surrounding inflammatory changes. Pancreatic ductal anatomy not well visualized, although appears normal. Spleen: Normal in size without focal abnormality. Adrenals/Urinary Tract: Both adrenal glands appear normal. Bilateral renal cortical scarring is again noted as seen on preoperative CT. There is a questionable small filling defect at the left ureteral pelvic junction, best seen on coronal T2 image 14/4 and axial image 3/7. Previous CT demonstrated a small calculus in the lower pole of the left kidney, and this could reflect a calculus which has moved to the ureteropelvic junction. However, there is no significant hydronephrosis or delayed contrast excretion. Stomach/Bowel: No evidence of bowel wall thickening, distention or surrounding inflammatory change. Vascular/Lymphatic: There are no enlarged abdominal lymph nodes. No significant vascular findings. Other: No ascites or mesenteric edema. Musculoskeletal: No acute or significant osseous findings. IMPRESSION: 1. No demonstrated complication from recent laparoscopic cholecystectomy. Borderline biliary dilatation, similar to previous CT without evidence of choledocholithiasis. 2. The pancreas appears normal. 3. Possible small calculus at the left ureteropelvic junction without hydronephrosis or significant delay in contrast excretion. Bilateral renal cortical scarring. Electronically Signed   By: Carey Bullocks M.D.   On: 11/01/2017 13:51   Dg Chest Port 1 View  Result Date: 11/04/2017 CLINICAL DATA:  Left-sided abdominal pain and chest pain for 2 days EXAM: PORTABLE CHEST 1 VIEW COMPARISON:  None. FINDINGS: The heart size and  mediastinal contours are within normal limits. Both lungs are clear. The visualized skeletal structures are unremarkable. IMPRESSION: No active disease. Electronically Signed   By: Alcide Clever M.D.   On: 11/04/2017 17:29   Dg C-arm 1-60 Min-no Report  Result Date: 11/04/2017 Fluoroscopy was utilized by the requesting physician.  No radiographic interpretation.   Mr Abdomen Mrcp Vivien Rossetti Contast  Result Date: 11/01/2017 CLINICAL DATA:  Severe right upper quadrant abdominal pain with nausea. Laparoscopic cholecystectomy 10/26/2017. EXAM: MRI ABDOMEN WITHOUT AND WITH CONTRAST (INCLUDING MRCP) TECHNIQUE: Multiplanar multisequence MR imaging of the abdomen was performed both before and after the administration of intravenous contrast. Heavily T2-weighted images of the biliary and pancreatic ducts were obtained, and three-dimensional MRCP images were rendered by post processing. CONTRAST:  30mL MULTIHANCE GADOBENATE DIMEGLUMINE 529 MG/ML IV SOLN COMPARISON:  Nuclear medicine hepatobiliary scan 11/01/2017. Abdominopelvic CT 10/07/2017. FINDINGS: Lower chest: Suboptimal inspiration without suspicious findings at the visualized lung bases. Hepatobiliary: The liver demonstrates mild loss of signal on the gradient echo opposed phase images, consistent with steatosis. No focal hepatic lesion or abnormal enhancement seen. Expected appearance of the cholecystectomy bed status post recent surgery. No significant fluid collection. There is no intra hepatic or significant extrahepatic biliary dilatation. The common hepatic duct measures 8 mm maximally. There is no evidence of choledocholithiasis. Pancreas: Unremarkable. No pancreatic ductal dilatation or surrounding inflammatory changes. Pancreatic ductal anatomy not well visualized, although appears normal. Spleen: Normal in size without focal abnormality. Adrenals/Urinary Tract: Both adrenal glands appear normal. Bilateral renal cortical scarring is again noted as seen on  preoperative CT. There is a questionable small filling defect at the left ureteral pelvic junction, best seen on coronal T2 image 14/4 and axial image 3/7. Previous CT demonstrated a small calculus in the lower pole of the left kidney, and this could reflect a calculus which has moved to the ureteropelvic junction. However, there is no significant hydronephrosis or delayed contrast excretion. Stomach/Bowel: No evidence of bowel wall thickening, distention or surrounding inflammatory change. Vascular/Lymphatic: There are no enlarged abdominal lymph nodes. No significant vascular findings. Other: No ascites or mesenteric edema. Musculoskeletal: No acute or significant osseous findings. IMPRESSION: 1. No demonstrated complication from recent laparoscopic cholecystectomy. Borderline biliary dilatation, similar to previous CT without evidence of choledocholithiasis. 2. The pancreas appears normal. 3. Possible small calculus at the left ureteropelvic junction without hydronephrosis or significant delay in contrast excretion. Bilateral renal cortical scarring. Electronically Signed   By: Carey Bullocks M.D.   On: 11/01/2017 13:51    Microbiology: Recent Results (from the past 240 hour(s))  Urine culture     Status: Abnormal   Collection Time: 11/04/17 12:02 PM  Result Value Ref Range Status   Specimen Description   Final    URINE, CLEAN CATCH Performed at [facility], [address] [place], [place] [address]    Special Requests   Final    NONE Performed at [facility], [address], [place], [place] [address]    Culture MULTIPLE SPECIES PRESENT, SUGGEST RECOLLECTION (A)  Final   Report Status 11/05/2017 FINAL  Final  Urine Culture     Status: Abnormal   Collection Time: 11/04/17  8:38 PM  Result Value Ref Range Status   Specimen Description CYSTOSCOPY  Final   Special Requests URINE, RANDOM  Final   Culture >=100,000  COLONIES/mL STAPHYLOCOCCUS EPIDERMIDIS  (A)  Final   Report Status 11/08/2017 FINAL  Final   Organism ID, Bacteria STAPHYLOCOCCUS EPIDERMIDIS (A)  Final      Susceptibility   Staphylococcus epidermidis - MIC*    CIPROFLOXACIN <=0.5 SENSITIVE Sensitive     ERYTHROMYCIN 0.5 SENSITIVE Sensitive     GENTAMICIN <=0.5 SENSITIVE Sensitive     OXACILLIN <=0.25 SENSITIVE Sensitive     TETRACYCLINE 4 SENSITIVE Sensitive     VANCOMYCIN 1 SENSITIVE Sensitive     TRIMETH/SULFA <=10 SENSITIVE Sensitive     CLINDAMYCIN <=0.25 SENSITIVE Sensitive     RIFAMPIN <=0.5 SENSITIVE Sensitive     Inducible Clindamycin NEGATIVE Sensitive     * >=100,000 COLONIES/mL STAPHYLOCOCCUS EPIDERMIDIS     Labs: Basic Metabolic Panel: Recent Labs  Lab 11/04/17 1242 11/05/17 0359 11/07/17 1000 11/08/17 0327 11/09/17 0407  NA 140 140 140 141 145  K 3.7 4.3 3.1* 3.2* 4.1  CL 106 107 105 106 109  CO2 24 21* GLUCOSE 105* 153* 91 103* 106*  BUN CREATININE 1.33* 1.08* 0.87 0.83 0.92  CALCIUM 9.0 8.1* 8.4* 9.0 8.9   Liver Function Tests: Recent Labs  Lab 11/04/17 1242 11/05/17 0359  AST 50* 34  ALT 170* 124*  ALKPHOS 106 92  BILITOT 0.9 0.8  PROT 7.3 6.9  ALBUMIN 3.6 3.1*   Recent Labs  Lab 11/04/17 1242  LIPASE 23   No results for input(s): AMMONIA in the last 168 hours. CBC: Recent Labs  Lab 11/04/17 1242 11/05/17 0359 11/07/17 1000 11/09/17 0407  WBC 26.3* 20.2* 15.7* 11.7*  NEUTROABS 22.9*  --  11.9*  --   HGB 12.9 12.6 11.3* 11.4*  HCT 40.2 39.5 35.2* 35.3*  MCV 84.3 84.4 83.2 83.6  PLT 232 222 264 309   Cardiac Enzymes: No results for input(s): CKTOTAL, CKMB, CKMBINDEX, TROPONINI in the last 168 hours. BNP: BNP (last 3 results) No results for input(s): BNP in the last 8760 hours.  ProBNP (last 3 results) No results for input(s): PROBNP in the last 8760 hours.  CBG: No results for input(s): GLUCAP in the last 168 hours.     Signed:  Darlin Drop, MD [facility] 11/09/2017,  11:11 AM

## 2017-11-14 ENCOUNTER — Encounter (HOSPITAL_BASED_OUTPATIENT_CLINIC_OR_DEPARTMENT_OTHER): Payer: Self-pay | Admitting: *Deleted

## 2017-11-17 ENCOUNTER — Other Ambulatory Visit: Payer: Self-pay

## 2017-11-17 ENCOUNTER — Encounter (HOSPITAL_BASED_OUTPATIENT_CLINIC_OR_DEPARTMENT_OTHER): Payer: Self-pay | Admitting: *Deleted

## 2017-11-17 NOTE — Progress Notes (Addendum)
SPOKE W/ PT VIA PHONE FOR PRE-OP INTERVIEW.  NPO AFTER MN.  ARRIVE AT 0530.  NEEDS URINE PREG.  CURRENT LAB RESULTS, DATED 10-30-14-2019, IN CHART AND Epic.  WILL TAKE WELLBUTRIN AND CETIRIZINE AM DOS W/ SIPS OF WATER AND IF NEEDED TAKE PRN RX'S.

## 2017-11-19 NOTE — Anesthesia Preprocedure Evaluation (Addendum)
Anesthesia Evaluation  Patient identified by MRN, date of birth, ID band Patient awake    Reviewed: Allergy & Precautions, NPO status , Patient's Chart, lab work & pertinent test results  History of Anesthesia Complications Negative for: history of anesthetic complications  Airway Mallampati: II  TM Distance: >3 FB Neck ROM: Full    Dental  (+) Dental Advisory Given   Pulmonary neg pulmonary ROS,    breath sounds clear to auscultation       Cardiovascular hypertension, Pt. on medications and Pt. on home beta blockers  Rhythm:Regular Rate:Normal     Neuro/Psych  Headaches, PSYCHIATRIC DISORDERS Depression    GI/Hepatic GERD  ,Cholelithiasis   Endo/Other  Obesity, pre-DM  Renal/GU Hx nephrolithiasis  Female GU complaint     Musculoskeletal   Abdominal (+) + obese,   Peds  Hematology negative hematology ROS (+)   Anesthesia Other Findings   Reproductive/Obstetrics OCPs                             Anesthesia Physical  Anesthesia Plan  ASA: II  Anesthesia Plan: General   Post-op Pain Management:    Induction: Intravenous  PONV Risk Score and Plan: 4 or greater and Scopolamine patch - Pre-op, Dexamethasone, Ondansetron, Treatment may vary due to age or medical condition and Midazolam  Airway Management Planned: LMA  Additional Equipment: None  Intra-op Plan:   Post-operative Plan: Extubation in OR  Informed Consent: I have reviewed the patients History and Physical, chart, labs and discussed the procedure including the risks, benefits and alternatives for the proposed anesthesia with the patient or authorized representative who has indicated his/her understanding and acceptance.   Dental advisory given  Plan Discussed with: CRNA and Anesthesiologist  Anesthesia Plan Comments:         Anesthesia Quick Evaluation

## 2017-11-21 ENCOUNTER — Ambulatory Visit (HOSPITAL_BASED_OUTPATIENT_CLINIC_OR_DEPARTMENT_OTHER): Payer: Medicaid Other | Admitting: Anesthesiology

## 2017-11-21 ENCOUNTER — Ambulatory Visit (HOSPITAL_BASED_OUTPATIENT_CLINIC_OR_DEPARTMENT_OTHER)
Admission: RE | Admit: 2017-11-21 | Discharge: 2017-11-21 | Disposition: A | Discharge status: Home / Self Care | Payer: Medicaid Other | Source: Ambulatory Visit | Attending: Urology | Admitting: Urology

## 2017-11-21 ENCOUNTER — Encounter (HOSPITAL_BASED_OUTPATIENT_CLINIC_OR_DEPARTMENT_OTHER)
Admission: RE | Disposition: A | Discharge status: Home / Self Care | Payer: Self-pay | Source: Ambulatory Visit | Attending: Urology

## 2017-11-21 ENCOUNTER — Encounter (HOSPITAL_BASED_OUTPATIENT_CLINIC_OR_DEPARTMENT_OTHER): Payer: Self-pay

## 2017-11-21 DIAGNOSIS — Z79899 Other long term (current) drug therapy: Secondary | ICD-10-CM | POA: Diagnosis not present

## 2017-11-21 DIAGNOSIS — K219 Gastro-esophageal reflux disease without esophagitis: Secondary | ICD-10-CM | POA: Insufficient documentation

## 2017-11-21 DIAGNOSIS — N200 Calculus of kidney: Secondary | ICD-10-CM | POA: Insufficient documentation

## 2017-11-21 DIAGNOSIS — R7303 Prediabetes: Secondary | ICD-10-CM | POA: Insufficient documentation

## 2017-11-21 DIAGNOSIS — N3281 Overactive bladder: Secondary | ICD-10-CM | POA: Diagnosis not present

## 2017-11-21 DIAGNOSIS — I10 Essential (primary) hypertension: Secondary | ICD-10-CM | POA: Diagnosis not present

## 2017-11-21 DIAGNOSIS — N201 Calculus of ureter: Secondary | ICD-10-CM

## 2017-11-21 HISTORY — PX: CYSTOSCOPY/URETEROSCOPY/HOLMIUM LASER/STENT PLACEMENT: SHX6546

## 2017-11-21 HISTORY — DX: Vesicoureteral-reflux, unspecified: N13.70

## 2017-11-21 HISTORY — DX: Interstitial cystitis (chronic) without hematuria: N30.10

## 2017-11-21 HISTORY — DX: Urge incontinence: N39.41

## 2017-11-21 LAB — POCT PREGNANCY, URINE: PREG TEST UR: NEGATIVE

## 2017-11-21 LAB — GLUCOSE, CAPILLARY: Glucose-Capillary: 107 mg/dL — ABNORMAL HIGH (ref 65–99)

## 2017-11-21 SURGERY — CYSTOSCOPY/URETEROSCOPY/HOLMIUM LASER/STENT PLACEMENT
Anesthesia: General | Site: Bladder | Laterality: Left

## 2017-11-21 MED ORDER — LACTATED RINGERS IV SOLN
INTRAVENOUS | Status: DC
  Administered 2017-11-21: 06:00:00 via INTRAVENOUS
  Filled 2017-11-21: qty 1000

## 2017-11-21 MED ORDER — ONDANSETRON HCL 4 MG/2ML IJ SOLN
INTRAMUSCULAR | Status: DC | PRN
  Administered 2017-11-21: 4 mg via INTRAVENOUS

## 2017-11-21 MED ORDER — SCOPOLAMINE 1 MG/3DAYS TD PT72
MEDICATED_PATCH | TRANSDERMAL | Status: AC
  Filled 2017-11-21: qty 1

## 2017-11-21 MED ORDER — OXYCODONE HCL 5 MG/5ML PO SOLN
5.0000 mg | Freq: Once | ORAL | Status: AC | PRN
  Filled 2017-11-21: qty 5

## 2017-11-21 MED ORDER — ONDANSETRON HCL 4 MG/2ML IJ SOLN
INTRAMUSCULAR | Status: AC
  Filled 2017-11-21: qty 2

## 2017-11-21 MED ORDER — SCOPOLAMINE 1 MG/3DAYS TD PT72
MEDICATED_PATCH | TRANSDERMAL | Status: DC | PRN
  Administered 2017-11-21: 1 via TRANSDERMAL

## 2017-11-21 MED ORDER — DEXAMETHASONE SODIUM PHOSPHATE 10 MG/ML IJ SOLN
INTRAMUSCULAR | Status: AC
  Filled 2017-11-21: qty 1

## 2017-11-21 MED ORDER — OXYCODONE HCL 5 MG PO TABS
5.0000 mg | ORAL_TABLET | Freq: Once | ORAL | Status: AC | PRN
  Administered 2017-11-21: 5 mg via ORAL
  Filled 2017-11-21: qty 1

## 2017-11-21 MED ORDER — PROMETHAZINE HCL 25 MG/ML IJ SOLN
6.2500 mg | INTRAMUSCULAR | Status: DC | PRN
  Filled 2017-11-21: qty 1

## 2017-11-21 MED ORDER — CEFAZOLIN SODIUM-DEXTROSE 2-4 GM/100ML-% IV SOLN
INTRAVENOUS | Status: AC
  Filled 2017-11-21: qty 100

## 2017-11-21 MED ORDER — PROPOFOL 10 MG/ML IV BOLUS
INTRAVENOUS | Status: AC
  Filled 2017-11-21: qty 20

## 2017-11-21 MED ORDER — FENTANYL CITRATE (PF) 100 MCG/2ML IJ SOLN
25.0000 ug | INTRAMUSCULAR | Status: DC | PRN
  Filled 2017-11-21: qty 1

## 2017-11-21 MED ORDER — KETOROLAC TROMETHAMINE 30 MG/ML IJ SOLN
INTRAMUSCULAR | Status: DC | PRN
  Administered 2017-11-21: 30 mg via INTRAVENOUS

## 2017-11-21 MED ORDER — KETOROLAC TROMETHAMINE 30 MG/ML IJ SOLN
INTRAMUSCULAR | Status: AC
Start: 2017-11-21 — End: ?
  Filled 2017-11-21: qty 1

## 2017-11-21 MED ORDER — OXYCODONE HCL 5 MG PO TABS
ORAL_TABLET | ORAL | Status: AC
Start: 2017-11-21 — End: ?
  Filled 2017-11-21: qty 1

## 2017-11-21 MED ORDER — FENTANYL CITRATE (PF) 100 MCG/2ML IJ SOLN
INTRAMUSCULAR | Status: AC
  Filled 2017-11-21: qty 2

## 2017-11-21 MED ORDER — CIPROFLOXACIN HCL 500 MG PO TABS: 500.0000 mg | ORAL_TABLET | Freq: Every day | ORAL | 0 refills | Status: DC

## 2017-11-21 MED ORDER — KETOROLAC TROMETHAMINE 30 MG/ML IJ SOLN
INTRAMUSCULAR | Status: AC
  Filled 2017-11-21: qty 1

## 2017-11-21 MED ORDER — MIDAZOLAM HCL 5 MG/5ML IJ SOLN
INTRAMUSCULAR | Status: DC | PRN
  Administered 2017-11-21: 2 mg via INTRAVENOUS

## 2017-11-21 MED ORDER — CIPROFLOXACIN IN D5W 400 MG/200ML IV SOLN
INTRAVENOUS | Status: AC
  Filled 2017-11-21: qty 200

## 2017-11-21 MED ORDER — DEXAMETHASONE SODIUM PHOSPHATE 4 MG/ML IJ SOLN
INTRAMUSCULAR | Status: DC | PRN
  Administered 2017-11-21: 5 mg via INTRAVENOUS

## 2017-11-21 MED ORDER — FENTANYL CITRATE (PF) 100 MCG/2ML IJ SOLN
INTRAMUSCULAR | Status: DC | PRN
  Administered 2017-11-21: 50 ug via INTRAVENOUS

## 2017-11-21 MED ORDER — CEFAZOLIN SODIUM-DEXTROSE 2-4 GM/100ML-% IV SOLN
2.0000 g | Freq: Once | INTRAVENOUS | Status: DC
  Filled 2017-11-21: qty 100

## 2017-11-21 MED ORDER — CIPROFLOXACIN IN D5W 400 MG/200ML IV SOLN
400.0000 mg | Freq: Once | INTRAVENOUS | Status: AC
  Administered 2017-11-21: 400 mg via INTRAVENOUS
  Filled 2017-11-21: qty 200

## 2017-11-21 MED ORDER — LIDOCAINE 2% (20 MG/ML) 5 ML SYRINGE
INTRAMUSCULAR | Status: AC
Start: 2017-11-21 — End: ?
  Filled 2017-11-21: qty 5

## 2017-11-21 MED ORDER — PROPOFOL 10 MG/ML IV BOLUS
INTRAVENOUS | Status: DC | PRN
  Administered 2017-11-21: 200 mg via INTRAVENOUS

## 2017-11-21 MED ORDER — MIDAZOLAM HCL 2 MG/2ML IJ SOLN
INTRAMUSCULAR | Status: AC
  Filled 2017-11-21: qty 2

## 2017-11-21 MED ORDER — LIDOCAINE HCL (CARDIAC) PF 100 MG/5ML IV SOSY
PREFILLED_SYRINGE | INTRAVENOUS | Status: DC | PRN
  Administered 2017-11-21: 50 mg via INTRAVENOUS

## 2017-11-21 SURGICAL SUPPLY — 29 items
BAG DRAIN URO-CYSTO SKYTR STRL (DRAIN) ×3 IMPLANT
BASKET LASER NITINOL 1.9FR (BASKET) IMPLANT
BASKET ZERO TIP NITINOL 2.4FR (BASKET) IMPLANT
BSKT STON RTRVL 120 1.9FR (BASKET)
BSKT STON RTRVL ZERO TP 2.4FR (BASKET)
CATH URET 5FR 28IN CONE TIP (BALLOONS)
CATH URET 5FR 28IN OPEN ENDED (CATHETERS) ×3 IMPLANT
CATH URET 5FR 70CM CONE TIP (BALLOONS) IMPLANT
CATH URET DUAL LUMEN 6-10FR 50 (CATHETERS) IMPLANT
CLOTH BEACON ORANGE TIMEOUT ST (SAFETY) ×3 IMPLANT
FIBER LASER FLEXIVA 365 (UROLOGICAL SUPPLIES) IMPLANT
FIBER LASER TRAC TIP (UROLOGICAL SUPPLIES) ×3 IMPLANT
GLOVE BIO SURGEON STRL SZ7.5 (GLOVE) ×3 IMPLANT
GOWN STRL REUS W/TWL LRG LVL3 (GOWN DISPOSABLE) ×3 IMPLANT
GOWN STRL REUS W/TWL XL LVL3 (GOWN DISPOSABLE) ×3 IMPLANT
GUIDEWIRE 0.038 PTFE COATED (WIRE) IMPLANT
GUIDEWIRE ANG ZIPWIRE 038X150 (WIRE) ×3 IMPLANT
GUIDEWIRE STR DUAL SENSOR (WIRE) ×3 IMPLANT
INFUSOR MANOMETER BAG 3000ML (MISCELLANEOUS) IMPLANT
IV NS IRRIG 3000ML ARTHROMATIC (IV SOLUTION) ×6 IMPLANT
KIT TURNOVER CYSTO (KITS) ×3 IMPLANT
MANIFOLD NEPTUNE II (INSTRUMENTS) ×3 IMPLANT
NS IRRIG 500ML POUR BTL (IV SOLUTION) ×3 IMPLANT
PACK CYSTO (CUSTOM PROCEDURE TRAY) ×3 IMPLANT
SHEATH URETERAL 12FRX28CM (UROLOGICAL SUPPLIES) ×3 IMPLANT
STENT URET 6FRX24 CONTOUR (STENTS) ×3 IMPLANT
TUBE CONNECTING 12'X1/4 (SUCTIONS) ×1
TUBE CONNECTING 12X1/4 (SUCTIONS) ×2 IMPLANT
TUBING UROLOGY SET (TUBING) ×3 IMPLANT

## 2017-11-21 NOTE — Discharge Instructions (Signed)
Ureteral Stent Implantation, Care After Refer to this sheet in the next few weeks. These instructions provide you with information about caring for yourself after your procedure. Your health care provider may also give you more specific instructions. Your treatment has been planned according to current medical practices, but problems sometimes occur. Call your health care provider if you have any problems or questions after your procedure.  Remove the stent: Friday morning, Nov 24, 2017 by pulling the string.   What can I expect after the procedure? After the procedure, it is common to have:  Nausea.  Mild pain when you urinate. You may feel this pain in your lower back or lower abdomen. Pain should stop within a few minutes after you urinate. This may last for up to 1 week.  A small amount of blood in your urine for several days.  Follow these instructions at home:  Medicines  Take over-the-counter and prescription medicines only as told by your health care provider.  If you were prescribed an antibiotic medicine, take it as told by your health care provider. Do not stop taking the antibiotic even if you start to feel better.  Do not drive for 24 hours if you received a sedative.  Do not drive or operate heavy machinery while taking prescription pain medicines. Activity  Return to your normal activities as told by your health care provider. Ask your health care provider what activities are safe for you.  Do not lift anything that is heavier than 10 lb (4.5 kg). Follow this limit for 1 week after your procedure, or for as long as told by your health care provider. General instructions  Watch for any blood in your urine. Call your health care provider if the amount of blood in your urine increases.  If you have a catheter: ? Follow instructions from your health care provider about taking care of your catheter and collection bag. ? Do not take baths, swim, or use a hot tub until  your health care provider approves.  Drink enough fluid to keep your urine clear or pale yellow.  Keep all follow-up visits as told by your health care provider. This is important. Contact a health care provider if:  You have pain that gets worse or does not get better with medicine, especially pain when you urinate.  You have difficulty urinating.  You feel nauseous or you vomit repeatedly during a period of more than 2 days after the procedure. Get help right away if:  Your urine is dark red or has blood clots in it.  You are leaking urine (have incontinence).  The end of the stent comes out of your urethra.  You cannot urinate.  You have sudden, sharp, or severe pain in your abdomen or lower back.  You have a fever. This information is not intended to replace advice given to you by your health care provider. Make sure you discuss any questions you have with your health care provider. Document Released: 02/13/2013 Document Revised: 11/19/2015 Document Reviewed: 12/26/2014 Elsevier Interactive Patient Education  2018 [employer].  Post Anesthesia Home Care Instructions  Activity: Get plenty of rest for the remainder of the day. A responsible individual must stay with you for 24 hours following the procedure.  For the next 24 hours, DO NOT: -Drive a car -Advertising copywriter -Drink alcoholic beverages -Take any medication unless instructed by your physician -Make any legal decisions or sign important papers.  Meals: Start with liquid foods such as gelatin or  soup. Progress to regular foods as tolerated. Avoid greasy, spicy, heavy foods. If nausea and/or vomiting occur, drink only clear liquids until the nausea and/or vomiting subsides. Call your physician if vomiting continues.  Special Instructions/Symptoms: Your throat may feel dry or sore from the anesthesia or the breathing tube placed in your throat during surgery. If this causes discomfort, gargle with warm salt  water. The discomfort should disappear within 24 hours.  If you had a scopolamine patch placed behind your ear for the management of post- operative nausea and/or vomiting:  1. The medication in the patch is effective for 72 hours, after which it should be removed.  Wrap patch in a tissue and discard in the trash. Wash hands thoroughly with soap and water. 2. You may remove the patch earlier than 72 hours if you experience unpleasant side effects which may include dry mouth, dizziness or visual disturbances. 3. Avoid touching the patch. Wash your hands with soap and water after contact with the patch.

## 2017-11-21 NOTE — Anesthesia Postprocedure Evaluation (Signed)
Anesthesia Post Note  Patient: Lauren Cardenas  Procedure(s) Performed: CYSTOSCOPY/URETEROSCOPY/HOLMIUM LASER/STENT PLACEMENT (Left Bladder)     Patient location during evaluation: PACU Anesthesia Type: General Level of consciousness: awake and alert Pain management: pain level controlled Vital Signs Assessment: post-procedure vital signs reviewed and stable Respiratory status: spontaneous breathing, nonlabored ventilation and respiratory function stable Cardiovascular status: blood pressure returned to baseline and stable Postop Assessment: no apparent nausea or vomiting Anesthetic complications: no    Last Vitals:  Vitals:   11/21/17 0915 11/21/17 0927  BP: 116/79 125/72  Pulse: 77 74  Resp: 12 14  Temp:  36.8 C  SpO2: 96% 100%    Last Pain:  Vitals:   11/21/17 0927  TempSrc: Oral  PainSc:                  [name]

## 2017-11-21 NOTE — Transfer of Care (Signed)
Immediate Anesthesia Transfer of Care Note  Patient: Lauren Cardenas  Procedure(s) Performed: CYSTOSCOPY/URETEROSCOPY/HOLMIUM LASER/STENT PLACEMENT (Left Bladder)  Patient Location: PACU  Anesthesia Type:General  Level of Consciousness: awake, alert  and oriented  Airway & Oxygen Therapy: Patient Spontanous Breathing and Patient connected to face mask oxygen  Post-op Assessment: Report given to RN and Post -op Vital signs reviewed and stable  Post vital signs: Reviewed and stable  Last Vitals:  Vitals Value Taken Time  BP    Temp    Pulse 75 11/21/2017  8:30 AM  Resp    SpO2 98 % 11/21/2017  8:30 AM  Vitals shown include unvalidated device data.  Last Pain:  Vitals:   11/21/17 0611  TempSrc: Oral  PainSc: 0-No pain      Patients Stated Pain Goal: 5 (11/21/17 1610)  Complications: No apparent anesthesia complications

## 2017-11-21 NOTE — Anesthesia Procedure Notes (Signed)
Procedure Name: LMA Insertion Date/Time: 11/21/2017 7:37 AM Performed by: Cleda Clarks, CRNA Pre-anesthesia Checklist: Patient identified, Emergency Drugs available, Suction available and Patient being monitored Patient Re-evaluated:Patient Re-evaluated prior to induction Oxygen Delivery Method: Circle system utilized Preoxygenation: Pre-oxygenation with 100% oxygen Induction Type: IV induction Ventilation: Mask ventilation without difficulty LMA: LMA inserted LMA Size: 4.0 Number of attempts: 1 Airway Equipment and Method: Bite block Placement Confirmation: positive ETCO2 Tube secured with: Tape Dental Injury: Teeth and Oropharynx as per pre-operative assessment

## 2017-11-21 NOTE — Op Note (Signed)
Preoperative diagnosis: Left ureteral stone Postoperative diagnosis: Left renal stone  Procedure: Cystoscopy, left ureteroscopy, holmium laser lithotripsy, stone basket extraction and left ureteral stent exchange  Surgeon: Mena Goes  Anesthesia: General  Indication for procedure: 32 year old white female who underwent urgent left ureteral stent a couple of weeks ago for fever, UTI and left maximal ureteral stone and hydronephrosis.   Findings: Stone noted near the left UPJ on initial fluoroscopic imaging and in the kidney on ureteroscopy.  It was pushed into the upper pole and fragmented.  Ureter normal.  Description of procedure: After consent was obtained patient brought to the operating room.  After adequate anesthesia she is placed in lithotomy position and prepped and draped in the usual sterile fashion.  A timeout was performed to confirm the patient and procedure.  The cystoscope was passed per urethra and the bladder irrigated several times.  Left ureteral stent grasped and removed through the urethral meatus and a sensor wire was advanced through this.  I then passed a semirigid ureteroscope to double check the ureter was clear which was confirmed.  Under direct vision a Glidewire was passed.  The scope was backed out and I placed an access sheath over the sensor wire without difficulty.  The flexible ureteroscope was advanced and the stone was noted to be in the renal pelvis and it was gently pushed into the upper pole.  Here it was fragmented at 0.4 and 50.  It was mostly dusted but about 4 sizable fragments remained.  These were removed sequentially with a 0 tip basket.  No other significant stones noted ureteroscopically or fluoroscopically.  The collecting system and renal pelvis were inspected and noted to be normal.  The ureter was inspected and the access sheath and flexible ureteroscope advanced together on the way out and the ureter noted to be normal.  The Glidewire was backloaded on  the cystoscope and the stent was advanced.  The wire was removed with a good coil seen in the kidney and a good coil in the bladder.  The scope was removed but accidentally pulled on the string  And pulled the stent out.  Therefore I readvanced the Glidewire to the collecting system and fluoroscopically push the stent up with the pusher.  The wire was removed again with good coil seen in the kidney and bladder.  The cystoscope was passed per urethra and the bladder double check the distal coil and this was noted to be normal.  The bladder was drained and the scope removed.  She was awakened and taken to the recovery room in stable condition.  Complications: None Blood loss: 0  Specimens: Stone fragments to office lab  Drains: 6 x 24 cm left ureteral stent with string  Disposition: Patient stable to PACU

## 2017-11-21 NOTE — Interval H&P Note (Signed)
History and Physical Interval Note:  11/21/2017 7:24 AM  Lauren Cardenas  has presented today for surgery, with the diagnosis of LEFT URETERAL STONE  The various methods of treatment have been discussed with the patient and family. After consideration of risks, benefits and other options for treatment, the patient has consented to  Procedure(s) with comments: CYSTOSCOPY/URETEROSCOPY/HOLMIUM LASER/STENT PLACEMENT (Left) - ONLY NEEDS 60 MIN as a surgical intervention .  The patient's history has been reviewed, patient examined, no change in status, stable for surgery. She completed cephalexin. No fever or dysuria. Urine clear. Mild hematuria. Urine cx was staph epu pan-sens and since cefazolin not tested I switched to Cipro IV. I have reviewed the patient's chart and labs.  Questions were answered to the patient's satisfaction.     Jerilee Field

## 2017-11-22 ENCOUNTER — Encounter (HOSPITAL_BASED_OUTPATIENT_CLINIC_OR_DEPARTMENT_OTHER): Payer: Self-pay | Admitting: Urology

## 2017-11-22 NOTE — Addendum Note (Signed)
Addendum  created 11/22/17 0906 by Tyrone Nine, CRNA   Charge Capture section accepted

## 2018-01-09 DIAGNOSIS — N200 Calculus of kidney: Secondary | ICD-10-CM | POA: Diagnosis not present

## 2018-01-09 DIAGNOSIS — R300 Dysuria: Secondary | ICD-10-CM | POA: Diagnosis not present

## 2018-01-09 DIAGNOSIS — R350 Frequency of micturition: Secondary | ICD-10-CM | POA: Diagnosis not present

## 2018-04-04 ENCOUNTER — Encounter: Payer: Self-pay | Admitting: Family Medicine

## 2018-04-04 ENCOUNTER — Ambulatory Visit: Payer: Medicaid Other | Admitting: Family Medicine

## 2018-04-04 ENCOUNTER — Other Ambulatory Visit: Payer: Self-pay | Admitting: Family Medicine

## 2018-04-04 VITALS — BP 132/88 | HR 81 | Temp 99.4°F | Ht 62.0 in | Wt 191.0 lb

## 2018-04-04 DIAGNOSIS — Z23 Encounter for immunization: Secondary | ICD-10-CM

## 2018-04-04 DIAGNOSIS — F411 Generalized anxiety disorder: Secondary | ICD-10-CM | POA: Insufficient documentation

## 2018-04-04 DIAGNOSIS — F330 Major depressive disorder, recurrent, mild: Secondary | ICD-10-CM | POA: Diagnosis not present

## 2018-04-04 MED ORDER — SERTRALINE HCL 50 MG PO TABS: 25.0000 mg | ORAL_TABLET | Freq: Every day | ORAL | 3 refills | Status: DC

## 2018-04-04 NOTE — Progress Notes (Addendum)
Subjective:     Lauren Cardenas is a 32 y.o. female who presents for follow up of anxiety disorder and depression . She has the following anxiety symptoms: difficulty concentrating, fatigue, feelings of losing control, irritable, palpitations and racing thoughts. Onset of symptoms was approximately 6 months ago. Symptoms have been gradually worsening since that time. She denies current suicidal and homicidal ideation. Family history significant for anxiety and depression. Risk factors: previous episode of depression. Previous treatment includes Wellbutrin. She discontinued this over 6 months ago. Her employer recently cut her hours at work and she has a 34 year old who is autistic and nonverbal. She states this has added a tremendous amount of stress and she is having a hard time. The following portions of the patient's history were reviewed and updated as appropriate: allergies, current medications, past family history, past medical history, past social history, past surgical history and problem list.  Review of Systems Review of Systems  Constitutional: Positive for malaise/fatigue. Negative for chills, fever and weight loss.  Respiratory: Negative for cough and shortness of breath.   Cardiovascular: Positive for palpitations. Negative for chest pain.  Gastrointestinal: Positive for nausea. Negative for abdominal pain, constipation, diarrhea and vomiting.  Neurological: Positive for headaches. Negative for dizziness, seizures, loss of consciousness and weakness.  Psychiatric/Behavioral: Positive for depression. Negative for hallucinations, memory loss, substance abuse and suicidal ideas. The patient is nervous/anxious. The patient does not have insomnia.        Trouble falling asleep due to racing thoughts.   All other systems reviewed and are negative.  GAD 7 : Generalized Anxiety Score 04/04/2018  Nervous, Anxious, on Edge 3  Control/stop worrying 3  Worry too much - different things 2   Trouble relaxing 3  Restless 0  Easily annoyed or irritable 2  Afraid - awful might happen 1  Total GAD 7 Score 14      Office Visit from 04/04/2018 in [facility]  PHQ-9 Total Score  10        Objective:    BP 132/88   Pulse 81   Temp 99.4 F (37.4 C) (Oral)   Ht 5\' 2"  (1.575 m)   Wt 191 lb (86.6 kg)   BMI 34.93 kg/m   General Appearance:    Alert, cooperative, no distress, appears stated age  Head:    Normocephalic, without obvious abnormality, atraumatic  Eyes:    PERRL, conjunctiva/corneas clear, EOM's intact, fundi    benign, both eyes  Ears:    Normal TM's and external ear canals, both ears  Nose:   Nares normal, septum midline, mucosa normal, no drainage    or sinus tenderness  Throat:   Lips, mucosa, and tongue normal; teeth and gums normal  Neck:   Supple, symmetrical, trachea midline, no adenopathy;    thyroid:  no enlargement/tenderness/nodules; no carotid   bruit or JVD  Back:     Symmetric, no curvature, ROM normal, no CVA tenderness  Lungs:     Clear to auscultation bilaterally, respirations unlabored  Chest Wall:    No tenderness or deformity   Heart:    Regular rate and rhythm, S1 and S2 normal, no murmur, rub   or gallop  Breast Exam:    No tenderness, masses, or nipple abnormality  Abdomen:     Soft, non-tender, bowel sounds active all four quadrants,    no masses, no organomegaly  Genitalia:    Normal female without lesion, discharge or tenderness  Rectal:  Normal tone, normal prostate, no masses or tenderness;   guaiac negative stool  Extremities:   Extremities normal, atraumatic, no cyanosis or edema  Pulses:   2+ and symmetric all extremities  Skin:   Skin color, texture, turgor normal, no rashes or lesions  Lymph nodes:   Cervical, supraclavicular, and axillary nodes normal  Neurologic:   CNII-XII intact, normal strength, sensation and reflexes    throughout      Assessment:   1. Generalized anxiety  disorder Stress management. Medications as prescribed. Report any worsening symptoms.  - sertraline (ZOLOFT) 50 MG tablet; Take 0.5 tablets (25 mg total) by mouth daily.  Dispense: 30 tablet; Refill: 3  2. Depression, major, recurrent, mild (HCC) Stress management. Medications as prescribed. Report any worsening symptoms. - sertraline (ZOLOFT) 50 MG tablet; Take 0.5 tablets (25 mg total) by mouth daily.  Dispense: 30 tablet; Refill: 3  3. Encounter for immunization   Plan:    Medications: Zoloft. Handouts describing disease, natural history, and treatment were given to the patient. Reviewed concept of anxiety as biochemical imbalance of neurotransmitters and rationale for treatment. Instructed patient to contact office or on-call physician promptly should condition worsen or any new symptoms appear and provided on-call telephone numbers. IF THE PATIENT HAS ANY SUICIDAL OR HOMICIDAL IDEATIONS, CALL THE OFFICE, DISCUSS WITH A SUPPORT MEMBER, OR GO TO THE ER IMMEDIATELY. Patient was agreeable with this plan. Follow up: 2 week. Spent  25 minutes (>50% of visit) discussing the risks of anxiety disorder and depression, the  pathophysiology, etiology, risks, and principles of treatment.    Kari Baars, FNP-C [facility]

## 2018-04-04 NOTE — Patient Instructions (Signed)

## 2018-04-06 ENCOUNTER — Other Ambulatory Visit: Payer: Self-pay

## 2018-04-06 ENCOUNTER — Emergency Department (HOSPITAL_COMMUNITY)
Admission: EM | Admit: 2018-04-06 | Discharge: 2018-04-06 | Disposition: A | Discharge status: Home / Self Care | Payer: Medicaid Other | Attending: Emergency Medicine | Admitting: Emergency Medicine

## 2018-04-06 ENCOUNTER — Encounter (HOSPITAL_COMMUNITY): Payer: Self-pay

## 2018-04-06 DIAGNOSIS — Z79899 Other long term (current) drug therapy: Secondary | ICD-10-CM | POA: Insufficient documentation

## 2018-04-06 DIAGNOSIS — I10 Essential (primary) hypertension: Secondary | ICD-10-CM | POA: Diagnosis not present

## 2018-04-06 DIAGNOSIS — R002 Palpitations: Secondary | ICD-10-CM | POA: Insufficient documentation

## 2018-04-06 DIAGNOSIS — R55 Syncope and collapse: Secondary | ICD-10-CM | POA: Diagnosis not present

## 2018-04-06 LAB — URINALYSIS, ROUTINE W REFLEX MICROSCOPIC
Bilirubin Urine: NEGATIVE
Glucose, UA: NEGATIVE mg/dL
KETONES UR: NEGATIVE mg/dL
LEUKOCYTES UA: NEGATIVE
Nitrite: NEGATIVE
PH: 5 (ref 5.0–8.0)
PROTEIN: 30 mg/dL — AB
Specific Gravity, Urine: 1.025 (ref 1.005–1.030)

## 2018-04-06 LAB — I-STAT CHEM 8, ED
BUN: 17 mg/dL (ref 6–20)
CHLORIDE: 111 mmol/L (ref 98–111)
CREATININE: 0.7 mg/dL (ref 0.44–1.00)
Calcium, Ion: 1.14 mmol/L — ABNORMAL LOW (ref 1.15–1.40)
Glucose, Bld: 80 mg/dL (ref 70–99)
HCT: 38 % (ref 36.0–46.0)
Hemoglobin: 12.9 g/dL (ref 12.0–15.0)
POTASSIUM: 3.9 mmol/L (ref 3.5–5.1)
Sodium: 141 mmol/L (ref 135–145)
TCO2: 21 mmol/L — ABNORMAL LOW (ref 22–32)

## 2018-04-06 LAB — I-STAT BETA HCG BLOOD, ED (MC, WL, AP ONLY): I-stat hCG, quantitative: <5 m[IU]/mL (ref <5)

## 2018-04-06 LAB — CBG MONITORING, ED: Glucose-Capillary: 80 mg/dL (ref 70–99)

## 2018-04-06 NOTE — ED Triage Notes (Addendum)
Patient presents with "feeling like my heart is thumping hard" and "maybe its an allergic reaction to the flu shot I got 2 days ago." Patient reports dizziness and "feeling faint" over the past 24 hours. Patient denies LOC. Patient denies history of flu shot allergy. Patient denies SOB aside from her usual diagnosis of low lung capacity. Patient heart rate is SR in triage, patient is hypertensive in triage. Patient reports starting 25mg  Certraline Wednesday night. Patient unsure what is causing her symptoms. Patient denies nausea/vomiting/diarrea. Patient reports "no appetite." Patient AxOx4 in triage and managing airway without issue.

## 2018-04-06 NOTE — ED Notes (Signed)
Failed attempt to collect labs   

## 2018-04-06 NOTE — ED Notes (Signed)
Failed attempt to collect labs. Pt will need ultra sound

## 2018-04-06 NOTE — ED Provider Notes (Signed)
[facility]-EMERGENCY DEPT Provider Note   CSN: [id number] Arrival date & time: 04/06/18  1532     History   Chief Complaint Chief Complaint  Patient presents with  . Near Syncope  . Hypertension    HPI NOVALI VOLLMAN is a 32 y.o. female.  32 year old female with history of anxiety and depression who was recently started on Zoloft for the symptoms presents with subjective palpitations without syncope.  Patient states that she has not had any associated chest pain.  Has not taken her pulse rate.  States that her palpitations seem to come and go and that nothing makes them better worse.  Denies any use of illicit drugs or excessive caffeine.  Has had no recent history of volume loss.  Has not tried anything to make her symptoms better.  Nothing makes them worse.     Past Medical History:  Diagnosis Date  . Chronic migraine    neurologist-  dr Lucia Gaskins  . Constipation   . Dysuria   . GERD (gastroesophageal reflux disease)   . History of acute pyelonephritis    09-27-2015:   11-04-2017  w/ sepsis due to obstruction from kidney stone  . History of kidney stones   . IC (interstitial cystitis)   . Interstitial cystitis   . Left ureteral stone   . OAB (overactive bladder)   . Pre-diabetes   . Urge urinary incontinence   . Urgency of urination   . Urinary reflux   . Vesicoureteral reflux     Patient Active Problem List   Diagnosis Date Noted  . Generalized anxiety disorder 04/04/2018  . Ureterolithiasis 11/04/2017  . Pyelonephritis 11/04/2017  . Postoperative upper abdominal pain 10/31/2017  . Prediabetes 05/30/2017  . Vitamin D deficiency 05/30/2017  . Other hyperlipidemia 05/30/2017  . Depression 05/30/2017  . Class 1 obesity with serious comorbidity and body mass index (BMI) of 31.0 to 31.9 in adult 05/30/2017  . Migraine without aura and without status migrainosus, not intractable 05/09/2017  . Other fatigue 03/13/2017  . Hyperglycemia  03/13/2017  . Class 1 obesity with serious comorbidity and body mass index (BMI) of 33.0 to 33.9 in adult 03/13/2017  . Syncope and collapse 09/27/2015  . Nephrolithiasis 09/27/2015  . Acute pyelonephritis 09/27/2015  . Sepsis (HCC) 09/27/2015  . Leukocytosis   . Syncope 03/20/2012    Past Surgical History:  Procedure Laterality Date  . CHOLECYSTECTOMY N/A 10/26/2017   Procedure: LAPAROSCOPIC CHOLECYSTECTOMY WITH INTRAOPERATIVE CHOLANGIOGRAM ERAS PATHWAY;  Surgeon: Harriette Bouillon, MD;  Location: [facility];  Service: General;  Laterality: N/A;  . CYSTOSCOPY W/ URETERAL STENT PLACEMENT Right 09/27/2015   Procedure: CYSTOSCOPY WITH RETROGRADE PYELOGRAM/URETERAL STENT PLACEMENT;  Surgeon: Hildred Laser, MD;  Location: [facility];  Service: Urology;  Laterality: Right;  . CYSTOSCOPY WITH RETROGRADE PYELOGRAM, URETEROSCOPY AND STENT PLACEMENT Left 09/09/2014   Procedure: CYSTOSCOPY WITH LEFT RETROGRADE PYELOGRAM, URETEROSCOPY AND STONE EXTRACTION  DOUBLE J STENT PLACEMENT;  Surgeon: Jerilee Field, MD;  Location: [facility];  Service: Urology;  Laterality: Left;  . CYSTOSCOPY WITH STENT PLACEMENT Left 11/04/2017   Procedure: CYSTOSCOPY WITH RETROGRADE AND LEFT STENT PLACEMENT;  Surgeon: Crista Elliot, MD;  Location: [facility];  Service: Urology;  Laterality: Left;  . CYSTOSCOPY/URETEROSCOPY/HOLMIUM LASER/STENT PLACEMENT Right 10/16/2015   Procedure: RIGHT URETEROSCOPY/HOLMIUM LASER/STENT PLACEMENT;  Surgeon: Jerilee Field, MD;  Location: [facility];  Service: Urology;  Laterality: Right;  . CYSTOSCOPY/URETEROSCOPY/HOLMIUM LASER/STENT PLACEMENT Left 11/21/2017   Procedure: CYSTOSCOPY/URETEROSCOPY/HOLMIUM LASER/STENT PLACEMENT;  Surgeon: Jerilee Field, MD;  Location: [facility];  Service: Urology;  Laterality: Left;  ONLY NEEDS 60 MIN  . HOLMIUM LASER APPLICATION Left 09/09/2014   Procedure: HOLMIUM LASER APPLICATION;  Surgeon: Jerilee Field, MD;  Location: [facility];   Service: Urology;  Laterality: Left;  . HOLMIUM LASER APPLICATION Right 10/16/2015   Procedure: HOLMIUM LASER APPLICATION;  Surgeon: Jerilee Field, MD;  Location: [facility];  Service: Urology;  Laterality: Right;  . STONE EXTRACTION WITH BASKET Right 10/16/2015   Procedure: STONE EXTRACTION WITH BASKET;  Surgeon: Jerilee Field, MD;  Location: [facility];  Service: Urology;  Laterality: Right;  . WISDOM TOOTH EXTRACTION  03/2014     OB History    Gravida  1   Para  1   Term  1   Preterm      AB      Living  1     SAB      TAB      Ectopic      Multiple      Live Births  1            Home Medications    Prior to Admission medications   Medication Sig Start Date End Date Taking? Authorizing Provider  AVIANE 0.1-20 MG-MCG tablet Take 1 tablet by mouth daily. 02/24/18   [provider]  metFORMIN (GLUCOPHAGE) 500 MG tablet Take 1 tablet (500 mg total) by mouth daily with breakfast. Patient not taking: Reported on 04/04/2018 08/28/17   Demetrio Lapping, PA-C  Multiple Vitamin (MULTI-VITAMIN DAILY PO) Multi Vitamin    [provider]  propranolol ER (INDERAL LA) 120 MG 24 hr capsule Take 1 capsule (120 mg total) at bedtime by mouth. 05/09/17   Anson Fret, MD  rizatriptan (MAXALT-MLT) 10 MG disintegrating tablet Take 1 tablet (10 mg total) 3 (three) times daily as needed by mouth for migraine. 05/09/17   Anson Fret, MD  sertraline (ZOLOFT) 50 MG tablet Take 0.5 tablets (25 mg total) by mouth daily. 04/04/18   Sonny Masters, FNP    Family History Family History  Problem Relation Age of Onset  . Hypertension Mother   . Obesity Mother   . Diabetes Father   . Kidney disease Father   . Sudden death Father     Social History Social History   Tobacco Use  . Smoking status: Never Smoker  . Smokeless tobacco: Never Used  Substance Use Topics  . Alcohol use: No  . Drug use: No     Allergies   Sudafed  [pseudoephedrine hcl] and Amoxicillin   Review of Systems Review of Systems  All other systems reviewed and are negative.    Physical Exam Updated Vital Signs BP (!) 172/105 (BP Location: Left Arm)   Pulse 80   Temp 98.2 F (36.8 C)   Resp 20   Ht 1.575 m (5\' 2" )   Wt 86.6 kg   LMP 03/20/2018   SpO2 98%   BMI 34.93 kg/m   Physical Exam  Constitutional: She is oriented to person, place, and time. She appears well-developed and well-nourished.  Non-toxic appearance. No distress.  HENT:  Head: Normocephalic and atraumatic.  Eyes: Pupils are equal, round, and reactive to light. Conjunctivae, EOM and lids are normal.  Neck: Normal range of motion. Neck supple. No tracheal deviation present. No thyroid mass present.  Cardiovascular: Normal rate, regular rhythm and normal heart sounds. Exam reveals no gallop.  No murmur heard. Pulmonary/Chest: Effort normal and breath sounds normal. No stridor. No respiratory distress. She has no decreased breath sounds. She has no wheezes. She has no rhonchi. She has no rales.  Abdominal: Soft. Normal appearance and bowel sounds are normal. She exhibits no distension. There is no tenderness. There is no rebound and no CVA tenderness.  Musculoskeletal: Normal range of motion. She exhibits no edema or tenderness.  Neurological: She is alert and oriented to person, place, and time. She has normal strength. No cranial nerve deficit or sensory deficit. GCS eye subscore is 4. GCS verbal subscore is 5. GCS motor subscore is 6.  Skin: Skin is warm and dry. No abrasion and no rash noted.  Psychiatric: She has a normal mood and affect. Her speech is normal and behavior is normal.  Nursing note and vitals reviewed.    ED Treatments / Results  Labs (all labs ordered are listed, but only abnormal results are displayed) Labs Reviewed  BASIC METABOLIC PANEL  CBC  URINALYSIS, ROUTINE W REFLEX MICROSCOPIC  CBG MONITORING, ED  I-STAT BETA HCG BLOOD, ED ([facility],  [facility], [facility] ONLY)    EKG EKG Interpretation  Date/Time:  Friday April 06 2018 15:41:21 EDT Ventricular Rate:  75 PR Interval:    QRS Duration: 100 QT Interval:  389 QTC Calculation: 435 R Axis:   -29 Text Interpretation:  Sinus rhythm Borderline left axis deviation Low voltage, precordial leads RSR' in V1 or V2, probably normal variant Borderline T abnormalities, diffuse leads Baseline wander in lead(s) II III aVR aVL aVF Confirmed by Vanetta Mulders [phone]) on 04/06/2018 3:46:25 PM   Radiology No results found.  Procedures Procedures (including critical care time)  Medications Ordered in ED Medications - No data to display   Initial Impression / Assessment and Plan / ED Course  I have reviewed the triage vital signs and the nursing notes.  Pertinent labs & imaging results that were available during my care of the patient were reviewed by me and considered in my medical decision making (see chart for details).     Patient's labs are reassuring here.  Urinalysis appears to be contaminated and culture sent.  Patient is EKG without acute findings.  Suspect some of this may be related to her anxiety and depression.  Discharged home with return precautions  Final Clinical Impressions(s) / ED Diagnoses   Final diagnoses:  None    ED Discharge Orders    None       Lorre Nick, MD 04/06/18 1945

## 2018-04-10 DIAGNOSIS — R300 Dysuria: Secondary | ICD-10-CM | POA: Diagnosis not present

## 2018-04-10 DIAGNOSIS — R8271 Bacteriuria: Secondary | ICD-10-CM | POA: Diagnosis not present

## 2018-04-10 DIAGNOSIS — N3000 Acute cystitis without hematuria: Secondary | ICD-10-CM | POA: Diagnosis not present

## 2018-04-13 ENCOUNTER — Telehealth: Payer: Self-pay

## 2018-04-13 NOTE — Telephone Encounter (Signed)
Left message for patient to call back so that we can reschedule her appointment that is scheduled for next Thursday, 04/19/18.

## 2018-04-19 ENCOUNTER — Ambulatory Visit: Payer: Medicaid Other | Admitting: Family Medicine

## 2018-04-25 ENCOUNTER — Encounter: Payer: Self-pay | Admitting: Family Medicine

## 2018-04-25 ENCOUNTER — Ambulatory Visit: Payer: Medicaid Other | Admitting: Family Medicine

## 2018-04-25 VITALS — BP 137/86 | HR 70 | Temp 99.6°F | Ht 62.0 in | Wt 187.0 lb

## 2018-04-25 DIAGNOSIS — F330 Major depressive disorder, recurrent, mild: Secondary | ICD-10-CM

## 2018-04-25 DIAGNOSIS — F411 Generalized anxiety disorder: Secondary | ICD-10-CM

## 2018-04-25 DIAGNOSIS — F339 Major depressive disorder, recurrent, unspecified: Secondary | ICD-10-CM | POA: Insufficient documentation

## 2018-04-25 MED ORDER — SERTRALINE HCL 50 MG PO TABS: 50.0000 mg | ORAL_TABLET | Freq: Every day | ORAL | 5 refills | Status: DC

## 2018-04-25 NOTE — Progress Notes (Signed)
  Subjective:     Lauren Cardenas is a 32 y.o. female who presents for follow up of anxiety disorder. Current symptoms: intermittent anxiety, fatigue, and headaches. . She denies current suicidal and homicidal ideation. She complains of the following side effects from the treatment: headache.  She reports she feels much better. States she has only been taking 25 mg zoloft nightly. States she thinks it would be beneficial to increase the dose. States she got a new job with increased hours and this has helped to decrease her worrying. She denies chest pain, shortness of breath, or palpitations. No abdominal pain, diarrhea, nausea, or constipation. Denies trouble concentrating.   The following portions of the patient's history were reviewed and updated as appropriate: allergies, current medications, past family history, past medical history, past social history, past surgical history and problem list.    Objective:    BP 137/86   Pulse 70   Temp 99.6 F (37.6 C) (Oral)   Ht 5\' 2"  (1.575 m)   Wt 187 lb (84.8 kg)   BMI 34.20 kg/m   General:  alert, cooperative and no distress  Affect/Behavior:  full facial expressions, good grooming, good insight, normal perception, normal reasoning, normal speech pattern and content and normal thought patterns none      Assessment:   1. Generalized anxiety disorder Great improvement in symptoms. Stress management. Still having some episodes of anxiety. Will increase sertraline to 50 mg nightly.  - sertraline (ZOLOFT) 50 MG tablet; Take 1 tablet (50 mg total) by mouth at bedtime.  Dispense: 30 tablet; Refill: 5  2. Mild episode of recurrent major depressive disorder (HCC) Great improvement in symptoms. Stress management.  - sertraline (ZOLOFT) 50 MG tablet; Take 1 tablet (50 mg total) by mouth at bedtime.  Dispense: 30 tablet; Refill: 5 Plan:    Medications: Zoloft. Handouts describing disease, natural history, and treatment were given to the  patient. Reviewed concept of anxiety as biochemical imbalance of neurotransmitters and rationale for treatment. Instructed patient to contact office or on-call physician promptly should condition worsen or any new symptoms appear and provided on-call telephone numbers. IF THE PATIENT HAS ANY SUICIDAL OR HOMICIDAL IDEATIONS, CALL THE OFFICE, DISCUSS WITH A SUPPORT MEMBER, OR GO TO THE ER IMMEDIATELY. Patient was agreeable with this plan. Follow up: 4 week.

## 2018-04-25 NOTE — Patient Instructions (Signed)
Living With Anxiety After being diagnosed with an anxiety disorder, you may be relieved to know why you have felt or behaved a certain way. It is natural to also feel overwhelmed about the treatment ahead and what it will mean for your life. With care and support, you can manage this condition and recover from it. How to cope with anxiety Dealing with stress Stress is your body's reaction to life changes and events, both good and bad. Stress can last just a few hours or it can be ongoing. Stress can play a major role in anxiety, so it is important to learn both how to cope with stress and how to think about it differently. Talk with your health care provider or a counselor to learn more about stress reduction. He or she may suggest some stress reduction techniques, such as:  Music therapy. This can include creating or listening to music that you enjoy and that inspires you.  Mindfulness-based meditation. This involves being aware of your normal breaths, rather than trying to control your breathing. It can be done while sitting or walking.  Centering prayer. This is a kind of meditation that involves focusing on a word, phrase, or sacred image that is meaningful to you and that brings you peace.  Deep breathing. To do this, expand your stomach and inhale slowly through your nose. Hold your breath for 3-5 seconds. Then exhale slowly, allowing your stomach muscles to relax.  Self-talk. This is a skill where you identify thought patterns that lead to anxiety reactions and correct those thoughts.  Muscle relaxation. This involves tensing muscles then relaxing them.  Choose a stress reduction technique that fits your lifestyle and personality. Stress reduction techniques take time and practice. Set aside 5-15 minutes a day to do them. Therapists can offer training in these techniques. The training may be covered by some insurance plans. Other things you can do to manage stress include:  Keeping a  stress diary. This can help you learn what triggers your stress and ways to control your response.  Thinking about how you respond to certain situations. You may not be able to control everything, but you can control your reaction.  Making time for activities that help you relax, and not feeling guilty about spending your time in this way.  Therapy combined with coping and stress-reduction skills provides the best chance for successful treatment. Medicines Medicines can help ease symptoms. Medicines for anxiety include:  Anti-anxiety drugs.  Antidepressants.  Beta-blockers.  Medicines may be used as the main treatment for anxiety disorder, along with therapy, or if other treatments are not working. Medicines should be prescribed by a health care provider. Relationships Relationships can play a big part in helping you recover. Try to spend more time connecting with trusted friends and family members. Consider going to couples counseling, taking family education classes, or going to family therapy. Therapy can help you and others better understand the condition. How to recognize changes in your condition Everyone has a different response to treatment for anxiety. Recovery from anxiety happens when symptoms decrease and stop interfering with your daily activities at home or work. This may mean that you will start to:  Have better concentration and focus.  Sleep better.  Be less irritable.  Have more energy.  Have improved memory.  It is important to recognize when your condition is getting worse. Contact your health care provider if your symptoms interfere with home or work and you do not feel like your condition   is improving. Where to find help and support: You can get help and support from these sources:  Self-help groups.  Online and community organizations.  A trusted spiritual leader.  Couples counseling.  Family education classes.  Family therapy.  Follow these  instructions at home:  Eat a healthy diet that includes plenty of vegetables, fruits, whole grains, low-fat dairy products, and lean protein. Do not eat a lot of foods that are high in solid fats, added sugars, or salt.  Exercise. Most adults should do the following: ? Exercise for at least 150 minutes each week. The exercise should increase your heart rate and make you sweat (moderate-intensity exercise). ? Strengthening exercises at least twice a week.  Cut down on caffeine, tobacco, alcohol, and other potentially harmful substances.  Get the right amount and quality of sleep. Most adults need 7-9 hours of sleep each night.  Make choices that simplify your life.  Take over-the-counter and prescription medicines only as told by your health care provider.  Avoid caffeine, alcohol, and certain over-the-counter cold medicines. These may make you feel worse. Ask your pharmacist which medicines to avoid.  Keep all follow-up visits as told by your health care provider. This is important. Questions to ask your health care provider  Would I benefit from therapy?  How often should I follow up with a health care provider?  How long do I need to take medicine?  Are there any long-term side effects of my medicine?  Are there any alternatives to taking medicine? Contact a health care provider if:  You have a hard time staying focused or finishing daily tasks.  You spend many hours a day feeling worried about everyday life.  You become exhausted by worry.  You start to have headaches, feel tense, or have nausea.  You urinate more than normal.  You have diarrhea. Get help right away if:  You have a racing heart and shortness of breath.  You have thoughts of hurting yourself or others. If you ever feel like you may hurt yourself or others, or have thoughts about taking your own life, get help right away. You can go to your nearest emergency department or call:  Your local emergency  services (911 in the [place]  A suicide crisis helpline, such as the National Suicide Prevention Lifeline at 1-800-273-8255. This is open 24-hours a day.  Summary  Taking steps to deal with stress can help calm you.  Medicines cannot cure anxiety disorders, but they can help ease symptoms.  Family, friends, and partners can play a big part in helping you recover from an anxiety disorder. This information is not intended to replace advice given to you by your health care provider. Make sure you discuss any questions you have with your health care provider. Document Released: 06/07/2016 Document Revised: 06/07/2016 Document Reviewed: 06/07/2016 Elsevier Interactive Patient Education  2018 [employer] Inc.   Major Depressive Disorder, Adult Major depressive disorder (MDD) is a mental health condition. MDD often makes you feel sad, hopeless, or helpless. MDD can also cause symptoms in your body. MDD can affect your:  Work.  School.  Relationships.  Other normal activities.  MDD can range from mild to very bad. It may occur once (single episode MDD). It can also occur many times (recurrent MDD). The main symptoms of MDD often include:  Feeling sad, depressed, or irritable most of the time.  Loss of interest.  MDD symptoms also include:  Sleeping too much or too little.  Eating too   much or too little.  A change in your weight.  Feeling tired (fatigue) or having low energy.  Feeling worthless.  Feeling guilty.  Trouble making decisions.  Trouble thinking clearly.  Thoughts of suicide or harming others.  Feeling weak.  Feeling agitated.  Keeping yourself from being around other people (isolation).  Follow these instructions at home: Activity  Do these things as told by your doctor: ? Go back to your normal activities. ? Exercise regularly. ? Spend time outdoors. Alcohol  Talk with your doctor about how alcohol can affect your antidepressant medicines.  Do  not drink alcohol. Or, limit how much alcohol you drink. ? This means no more than 1 drink a day for nonpregnant women and 2 drinks a day for men. One drink equals one of these:  12 oz of beer.  5 oz of wine.  1 oz of hard liquor. General instructions  Take over-the-counter and prescription medicines only as told by your doctor.  Eat a healthy diet.  Get plenty of sleep.  Find activities that you enjoy. Make time to do them.  Think about joining a support group. Your doctor may be able to suggest a group for you.  Keep all follow-up visits as told by your doctor. This is important. Where to find more information:  National Alliance on Mental Illness: ? www.nami.org  [place] National Institute of Mental Health: ? www.nimh.nih.gov  National Suicide Prevention Lifeline: ? 1-800-273-8255. This is free, 24-hour help. Contact a doctor if:  Your symptoms get worse.  You have new symptoms. Get help right away if:  You self-harm.  You see, hear, taste, smell, or feel things that are not present (hallucinate). If you ever feel like you may hurt yourself or others, or have thoughts about taking your own life, get help right away. You can go to your nearest emergency department or call:  Your local emergency services (911 in the [place]  A suicide crisis helpline, such as the National Suicide Prevention Lifeline: ? 1-800-273-8255. This is open 24 hours a day.  This information is not intended to replace advice given to you by your health care provider. Make sure you discuss any questions you have with your health care provider. Document Released: 05/25/2015 Document Revised: 02/28/2016 Document Reviewed: 02/28/2016 Elsevier Interactive Patient Education  2017 Elsevier Inc.  

## 2018-04-26 DIAGNOSIS — N3000 Acute cystitis without hematuria: Secondary | ICD-10-CM | POA: Diagnosis not present

## 2018-05-14 DIAGNOSIS — N3946 Mixed incontinence: Secondary | ICD-10-CM | POA: Diagnosis not present

## 2018-05-14 DIAGNOSIS — R350 Frequency of micturition: Secondary | ICD-10-CM | POA: Diagnosis not present

## 2018-05-14 DIAGNOSIS — N200 Calculus of kidney: Secondary | ICD-10-CM | POA: Diagnosis not present

## 2018-05-23 ENCOUNTER — Encounter: Payer: Self-pay | Admitting: Family Medicine

## 2018-05-23 ENCOUNTER — Ambulatory Visit: Payer: Medicaid Other | Admitting: Family Medicine

## 2018-05-23 DIAGNOSIS — F330 Major depressive disorder, recurrent, mild: Secondary | ICD-10-CM

## 2018-05-23 DIAGNOSIS — F411 Generalized anxiety disorder: Secondary | ICD-10-CM

## 2018-05-23 MED ORDER — SERTRALINE HCL 50 MG PO TABS: 50.0000 mg | ORAL_TABLET | Freq: Every day | ORAL | 3 refills | Status: DC

## 2018-05-23 MED ORDER — BUPROPION HCL ER (SR) 200 MG PO TB12: 200.0000 mg | ORAL_TABLET | Freq: Every day | ORAL | 3 refills | Status: DC

## 2018-05-23 NOTE — Progress Notes (Signed)
Subjective:     Lauren Cardenas is a 32 y.o. female who presents for follow up of anxiety disorder and depression. Current symptoms: none. She denies current suicidal and homicidal ideation. She complains of the following side effects from the treatment: headache, mild and intermittent.   GAD 7 : Generalized Anxiety Score 05/23/2018 04/04/2018  Nervous, Anxious, on Edge 0 3  Control/stop worrying 0 3  Worry too much - different things 1 2  Trouble relaxing 0 3  Restless 0 0  Easily annoyed or irritable 0 2  Afraid - awful might happen 0 1  Total GAD 7 Score 1 14  Anxiety Difficulty Not difficult at all -   [facility] 05/23/2018 04/25/2018 04/04/2018 10/10/2017 10/06/2017  Decreased Interest 0 0 1 0 0  Down, Depressed, Hopeless 0 1 1 0 0  PHQ - 2 Score 0 1 2 0 0  Altered sleeping 0 - 1 - -  Tired, decreased energy 0 - 2 - -  Change in appetite 0 - 2 - -  Feeling bad or failure about yourself  0 - 1 - -  Trouble concentrating 0 - 2 - -  Moving slowly or fidgety/restless 0 - 0 - -  Suicidal thoughts 0 - 0 - -  PHQ-9 Score 0 - 10 - -  Difficult doing work/chores Not difficult at all - - - -     The following portions of the patient's history were reviewed and updated as appropriate: allergies, current medications, past family history, past medical history, past social history, past surgical history and problem list.    Objective:    BP 122/75   Pulse 72   Temp 100.2 F (37.9 C) (Oral)   Ht 5\' 2"  (1.575 m)   Wt 190 lb (86.2 kg)   BMI 34.75 kg/m   General:  alert, cooperative and appears stated age  Affect/Behavior:  full facial expressions, good grooming, good insight, normal perception, normal reasoning, normal speech pattern and content and normal thought patterns none      Assessment:   1. Generalized anxiety disorder Tolerating medications well without side effects.  - buPROPion (WELLBUTRIN SR) 200 MG 12 hr tablet; Take 1 tablet (200 mg total) by mouth  at bedtime.  Dispense: 90 tablet; Refill: 3 - sertraline (ZOLOFT) 50 MG tablet; Take 1 tablet (50 mg total) by mouth at bedtime.  Dispense: 90 tablet; Refill: 3  2. Mild episode of recurrent major depressive disorder (HCC) Tolerating medications well without side effects.  - buPROPion (WELLBUTRIN SR) 200 MG 12 hr tablet; Take 1 tablet (200 mg total) by mouth at bedtime.  Dispense: 90 tablet; Refill: 3 - sertraline (ZOLOFT) 50 MG tablet; Take 1 tablet (50 mg total) by mouth at bedtime.  Dispense: 90 tablet; Refill: 3   Plan:    Medications: Wellbutrin and Zoloft. Handouts describing disease, natural history, and treatment were given to the patient. Reviewed concept of anxiety as biochemical imbalance of neurotransmitters and rationale for treatment. Instructed patient to contact office or on-call physician promptly should condition worsen or any new symptoms appear and provided on-call telephone numbers. IF THE PATIENT HAS ANY SUICIDAL OR HOMICIDAL IDEATIONS, CALL THE OFFICE, DISCUSS WITH A SUPPORT MEMBER, OR GO TO THE ER IMMEDIATELY. Patient was agreeable with this plan. Follow up: 3 months. Spent 15 minutes (>50% of visit) discussing the risks of anxiety disorder and depression, the  pathophysiology, etiology, risks, and principles of treatment.    The above assessment and  management plan was discussed with the patient. The patient verbalized understanding of and has agreed to the management plan. Patient is aware to call the clinic if symptoms fail to improve or worsen. Patient is aware when to return to the clinic for a follow-up visit. Patient educated on when it is appropriate to go to the emergency department.   Kari Baars, FNP-C [facility] [address] [place], [place] [address] [phone]

## 2018-05-23 NOTE — Patient Instructions (Signed)

## 2018-05-29 ENCOUNTER — Other Ambulatory Visit: Payer: Self-pay | Admitting: Neurology

## 2018-05-29 ENCOUNTER — Other Ambulatory Visit: Payer: Self-pay | Admitting: *Deleted

## 2018-05-29 MED ORDER — PROPRANOLOL HCL ER 120 MG PO CP24: 120.0000 mg | ORAL_CAPSULE | Freq: Every day | ORAL | 0 refills | Status: DC

## 2018-07-31 ENCOUNTER — Ambulatory Visit: Payer: Medicaid Other | Admitting: Family Medicine

## 2018-07-31 ENCOUNTER — Encounter: Payer: Self-pay | Admitting: Family Medicine

## 2018-07-31 VITALS — BP 115/80 | HR 80 | Temp 99.7°F | Ht 62.0 in | Wt 192.0 lb

## 2018-07-31 DIAGNOSIS — R6889 Other general symptoms and signs: Secondary | ICD-10-CM | POA: Diagnosis not present

## 2018-07-31 DIAGNOSIS — J029 Acute pharyngitis, unspecified: Secondary | ICD-10-CM

## 2018-07-31 DIAGNOSIS — J069 Acute upper respiratory infection, unspecified: Secondary | ICD-10-CM | POA: Diagnosis not present

## 2018-07-31 NOTE — Patient Instructions (Signed)
It appears that you have a viral upper respiratory infection (cold).  Cold symptoms can last up to 2 weeks.    - Get plenty of rest and drink plenty of fluids. - Try to breathe moist air. Use a cold mist humidifier. - Consume warm fluids (soup or tea) to provide relief for a stuffy nose and to loosen phlegm. - For cough and congestion you can use plain Mucinex, regular or max strength, follow box directions.  - For nasal stuffiness, try saline nasal spray or a Neti Pot. You can use saline nasal spray 4 times daily. Do not use tap water in the Neti Pot, follow instructions on box for proper use. Afrin nasal spray can also be used but this product should not be used longer than 3 days or it will cause rebound nasal stuffiness (worsening nasal congestion). - For sore throat pain relief: use chloraseptic spray, suck on throat lozenges, hard candy or popsicles; gargle with warm salt water (1/4 tsp. salt per 8 oz. of water); and eat soft, bland foods. - For fever or aches and pains take tylenol or motrin as appropriate for age and weight.  - Eat a well-balanced diet. If you cannot, ensure you are getting enough nutrients by taking a daily multivitamin. - Avoid dairy products, as they can thicken phlegm. - Avoid alcohol, as it impairs your body's immune system. - Change your toothbrush in 3 days.   CONTACT YOUR DOCTOR IF YOU EXPERIENCE ANY OF THE FOLLOWING: - High fever - Ear pain - Sinus-type headache - Unusually severe cold symptoms - Cough that gets worse while other cold symptoms improve - Flare up of any chronic lung problem, such as asthma - Your symptoms persist longer than 2 weeks   

## 2018-07-31 NOTE — Progress Notes (Signed)
    Subjective:     Lauren Cardenas is a 33 y.o. female who presents for evaluation of symptoms of a URI. Symptoms include achiness, congestion, coryza, headache described as aching, low grade fever, nasal congestion, post nasal drip and sore throat. Onset of symptoms was 1 day ago, and has been gradually worsening since that time. Treatment to date: antihistamines, cough suppressants and decongestants.  The following portions of the patient's history were reviewed and updated as appropriate: allergies, current medications, past family history, past medical history, past social history, past surgical history and problem list.  Review of Systems Constitutional: positive for chills, fatigue and fevers Eyes: negative Ears, nose, mouth, throat, and face: positive for nasal congestion and sore throat Respiratory: positive for cough Cardiovascular: negative Musculoskeletal:positive for myalgias Neurological: positive for headaches   Objective:    BP 115/80   Pulse 80   Temp 99.7 F (37.6 C) (Oral)   Ht 5\' 2"  (1.575 m)   Wt 192 lb (87.1 kg)   BMI 35.12 kg/m  General appearance: alert, cooperative, appears stated age and mild distress Head: Normocephalic, without obvious abnormality, atraumatic Eyes: negative Ears: normal TM's and external ear canals both ears Nose: clear and scant discharge, mild congestion, turbinates red, swollen, no sinus tenderness Throat: abnormal findings: mild oropharyngeal erythema Neck: no adenopathy, no carotid bruit, no JVD, supple, symmetrical, trachea midline and thyroid not enlarged, symmetric, no tenderness/mass/nodules Lungs: clear to auscultation bilaterally Heart: regular rate and rhythm, S1, S2 normal, no murmur, click, rub or gallop Skin: Skin color, texture, turgor normal. No rashes or lesions Neurologic: Grossly normal   Assessment:     Beula was seen today for fever, sore throat, body aches, cough.  Diagnoses and all orders for this  visit:  URI with cough and congestion Symptomatic care discussed. Report any new or worsening symptoms.   Flu-like symptoms Influenza swab negative in office.  -     Veritor Flu A/B Waived  Sore throat Rapid strep in office negative.  Symptomatic care discussed. Report any new or worsening symptoms.  -     Rapid Strep Screen ([facility] ONLY)     Plan:    Discussed diagnosis and treatment of URI. Discussed the importance of avoiding unnecessary antibiotic therapy. Suggested symptomatic OTC remedies. Nasal saline spray for congestion. Follow up as needed. Call in 4 days if symptoms aren't resolving. Follow up as needed.    The above assessment and management plan was discussed with the patient. The patient verbalized understanding of and has agreed to the management plan. Patient is aware to call the clinic if symptoms fail to improve or worsen. Patient is aware when to return to the clinic for a follow-up visit. Patient educated on when it is appropriate to go to the emergency department.   Kari Baars, FNP-C [facility] [address] [place], [place] [address] [phone]

## 2018-08-01 LAB — CULTURE, GROUP A STREP

## 2018-08-01 LAB — RAPID STREP SCREEN (MED CTR MEBANE ONLY): Strep Gp A Ag, IA W/Reflex: NEGATIVE

## 2018-08-01 LAB — VERITOR FLU A/B WAIVED
INFLUENZA B: NEGATIVE
Influenza A: NEGATIVE

## 2018-08-03 ENCOUNTER — Encounter: Payer: Self-pay | Admitting: Family Medicine

## 2018-08-03 ENCOUNTER — Ambulatory Visit: Payer: Medicaid Other | Admitting: Family Medicine

## 2018-08-03 VITALS — BP 109/70 | HR 77 | Temp 99.7°F | Ht 62.0 in | Wt 190.0 lb

## 2018-08-03 DIAGNOSIS — J029 Acute pharyngitis, unspecified: Secondary | ICD-10-CM | POA: Diagnosis not present

## 2018-08-03 DIAGNOSIS — J020 Streptococcal pharyngitis: Secondary | ICD-10-CM | POA: Diagnosis not present

## 2018-08-03 LAB — RAPID STREP SCREEN (MED CTR MEBANE ONLY): Strep Gp A Ag, IA W/Reflex: POSITIVE — AB

## 2018-08-03 MED ORDER — CEFDINIR 300 MG PO CAPS: 300.0000 mg | ORAL_CAPSULE | Freq: Two times a day (BID) | ORAL | 0 refills | Status: AC

## 2018-08-03 NOTE — Patient Instructions (Signed)
Pharyngitis  Pharyngitis is a sore throat (pharynx). This is when there is redness, pain, and swelling in your throat. Most of the time, this condition gets better on its own. In some cases, you may need medicine. Follow these instructions at home:  Take over-the-counter and prescription medicines only as told by your doctor. ? If you were prescribed an antibiotic medicine, take it as told by your doctor. Do not stop taking the antibiotic even if you start to feel better. ? Do not give children aspirin. Aspirin has been linked to Reye syndrome.  Drink enough water and fluids to keep your pee (urine) clear or pale yellow.  Get a lot of rest.  Rinse your mouth (gargle) with a salt-water mixture 3-4 times a day or as needed. To make a salt-water mixture, completely dissolve -1 tsp of salt in 1 cup of warm water.  If your doctor approves, you may use throat lozenges or sprays to soothe your throat. Contact a doctor if:  You have large, tender lumps in your neck.  You have a rash.  You cough up green, yellow-brown, or bloody spit. Get help right away if:  You have a stiff neck.  You drool or cannot swallow liquids.  You cannot drink or take medicines without throwing up.  You have very bad pain that does not go away with medicine.  You have problems breathing, and it is not from a stuffy nose.  You have new pain and swelling in your knees, ankles, wrists, or elbows. Summary  Pharyngitis is a sore throat (pharynx). This is when there is redness, pain, and swelling in your throat.  If you were prescribed an antibiotic medicine, take it as told by your doctor. Do not stop taking the antibiotic even if you start to feel better.  Most of the time, pharyngitis gets better on its own. Sometimes, you may need medicine. This information is not intended to replace advice given to you by your health care provider. Make sure you discuss any questions you have with your health care  provider. Document Released: 11/30/2007 Document Revised: 07/19/2016 Document Reviewed: 07/19/2016 Elsevier Interactive Patient Education  2019 Elsevier Inc.  

## 2018-08-03 NOTE — Progress Notes (Signed)
Lauren Cardenas is a 33 y.o. female presenting with a sore throat for 4 days.  Associated symptoms include:  fever, chills and nasal/sinus congestion.  Symptoms are progressively worsening.  Home treatment thus far includes:  rest, hydration, NSAIDS/acetaminophen, lozenges and OTC sore throat / cold products.  No known sick contacts with similar symptoms.  There is no history of of similar symptoms.  Exam:  BP 109/70   Pulse 77   Temp 99.7 F (37.6 C) (Oral)   Ht 5\' 2"  (1.575 m)   Wt 190 lb (86.2 kg)   BMI 34.75 kg/m  Physical Exam Vitals signs and nursing note reviewed.  Constitutional:      General: She is in acute distress (mild).     Appearance: Normal appearance. She is well-developed and well-groomed. She is not ill-appearing or toxic-appearing.  HENT:     Head: Normocephalic and atraumatic.     Right Ear: Hearing, tympanic membrane, ear canal and external ear normal.     Left Ear: Hearing, tympanic membrane, ear canal and external ear normal.     Nose: Congestion and rhinorrhea present. Rhinorrhea is clear.     Right Sinus: No maxillary sinus tenderness or frontal sinus tenderness.     Left Sinus: No maxillary sinus tenderness or frontal sinus tenderness.     Mouth/Throat:     Lips: Pink.     Mouth: Mucous membranes are moist.     Pharynx: Uvula midline. Pharyngeal swelling, oropharyngeal exudate and posterior oropharyngeal erythema present. No uvula swelling.     Tonsils: Tonsillar exudate present. No tonsillar abscesses. Swelling: 3+ on the right. 3+ on the left.  Eyes:     General: Lids are normal.     Extraocular Movements: Extraocular movements intact.     Conjunctiva/sclera: Conjunctivae normal.     Pupils: Pupils are equal, round, and reactive to light.  Cardiovascular:     Rate and Rhythm: Normal rate and regular rhythm.     Heart sounds: Normal heart sounds. No murmur. No friction rub. No gallop.   Pulmonary:     Effort: Pulmonary effort is normal.  No respiratory distress.     Breath sounds: Normal breath sounds.  Lymphadenopathy:     Cervical: Cervical adenopathy (mild) present.  Skin:    General: Skin is warm and dry.     Capillary Refill: Capillary refill takes less than 2 seconds.  Neurological:     General: No focal deficit present.     Mental Status: She is alert and oriented to person, place, and time.  Psychiatric:        Mood and Affect: Mood normal.        Behavior: Behavior normal. Behavior is cooperative.        Thought Content: Thought content normal.        Judgment: Judgment normal.    Rapid strep positive in office today.  Assessment and Plan  Lauren Cardenas was seen today for still sick.  Diagnoses and all orders for this visit:  Strep pharyngitis Symptomatic care discussed. Medications as prescribed. Report any new or worsening symptoms.  -     cefdinir (OMNICEF) 300 MG capsule; Take 1 capsule (300 mg total) by mouth 2 (two) times daily for 10 days. 1 po BID  Sore throat -     Rapid Strep Screen ([facility] ONLY)   Return if symptoms worsen or fail to improve.  The above assessment and management plan was discussed with the patient. The  patient verbalized understanding of and has agreed to the management plan. Patient is aware to call the clinic if symptoms fail to improve or worsen. Patient is aware when to return to the clinic for a follow-up visit. Patient educated on when it is appropriate to go to the emergency department.   Kari BaarsMichelle Cedar Ditullio, FNP-C [facility] [address] [place], [place] [id number] [phone]

## 2018-08-17 ENCOUNTER — Ambulatory Visit: Payer: Medicaid Other | Admitting: Physician Assistant

## 2018-08-17 ENCOUNTER — Encounter: Payer: Self-pay | Admitting: Physician Assistant

## 2018-08-17 VITALS — BP 132/88 | HR 71 | Temp 100.0°F | Ht 62.0 in | Wt 194.0 lb

## 2018-08-17 DIAGNOSIS — J029 Acute pharyngitis, unspecified: Secondary | ICD-10-CM

## 2018-08-17 DIAGNOSIS — A388 Scarlet fever with other complications: Secondary | ICD-10-CM

## 2018-08-17 DIAGNOSIS — J020 Streptococcal pharyngitis: Secondary | ICD-10-CM | POA: Diagnosis not present

## 2018-08-17 LAB — RAPID STREP SCREEN (MED CTR MEBANE ONLY): Strep Gp A Ag, IA W/Reflex: POSITIVE — AB

## 2018-08-17 MED ORDER — CLINDAMYCIN HCL 300 MG PO CAPS: 300.0000 mg | ORAL_CAPSULE | Freq: Three times a day (TID) | ORAL | 0 refills | Status: DC

## 2018-08-19 NOTE — Progress Notes (Signed)
  BP 132/88   Pulse 71   Temp 100 F (37.8 C) (Oral)   Ht 5' 2" (1.575 m)   Wt 194 lb (88 kg)   BMI 35.48 kg/m    Subjective:    Patient ID: Lauren Cardenas, female    DOB: 02/22/1986, 33 y.o.   MRN: [id number]  HPI: Lauren Cardenas is a 33 y.o. female presenting on 08/17/2018 for Sore Throat  .This patient has sore throat and had less than 2 days severe fever, chills, myalgias.  Complains of sinus headache and postnasal drainage. There is copious drainage at times. Pain with swallowing, decreased appetite and headache.  Exposure to strep.   Past Medical History:  Diagnosis Date  . Chronic migraine    neurologist-  dr ahern  . Constipation   . Depression 05/30/2017   with emotional eating  . Dysuria   . GERD (gastroesophageal reflux disease)   . History of acute pyelonephritis    09-27-2015:   11-04-2017  w/ sepsis due to obstruction from kidney stone  . History of kidney stones   . IC (interstitial cystitis)   . Interstitial cystitis   . Left ureteral stone   . OAB (overactive bladder)   . Pre-diabetes   . Urge urinary incontinence   . Urgency of urination   . Urinary reflux   . Vesicoureteral reflux    Relevant past medical, surgical, family and social history reviewed and updated as indicated. Interim medical history since our last visit reviewed. Allergies and medications reviewed and updated. DATA REVIEWED: CHART IN EPIC  Family History reviewed for pertinent findings.  Review of Systems  Constitutional: Positive for chills, fatigue and fever. Negative for activity change and appetite change.  HENT: Positive for congestion, postnasal drip and sore throat.   Eyes: Negative.   Respiratory: Positive for cough. Negative for wheezing.   Cardiovascular: Negative.  Negative for chest pain, palpitations and leg swelling.  Gastrointestinal: Negative.   Genitourinary: Negative.   Musculoskeletal: Negative.   Skin: Negative.   Neurological: Positive for  headaches.    Allergies as of 08/17/2018      Reactions   Sudafed [pseudoephedrine Hcl] Shortness Of Breath   Amoxicillin Hives   ++ got ceftriaxone in the past++ Has patient had a PCN reaction causing immediate rash, facial/tongue/throat swelling, SOB or lightheadedness with hypotension: Yes Has patient had a PCN reaction causing severe rash involving mucus membranes or skin necrosis: No Has patient had a PCN reaction that required hospitalization: No Has patient had a PCN reaction occurring within the last 10 years: Yes If all of the above answers are "NO", then may proceed with Cephalosporin use.      Medication List       Accurate as of August 17, 2018 11:59 PM. Always use your most recent med list.        AVIANE 0.1-20 MG-MCG tablet Generic drug:  levonorgestrel-ethinyl estradiol Take 1 tablet by mouth at bedtime.   buPROPion 200 MG 12 hr tablet Commonly known as:  WELLBUTRIN SR Take 1 tablet (200 mg total) by mouth at bedtime.   clindamycin 300 MG capsule Commonly known as:  CLEOCIN Take 1 capsule (300 mg total) by mouth 3 (three) times daily.   CRANBERRY/PROBIOTIC Tabs Take 1 tablet by mouth at bedtime.   ibuprofen 200 MG tablet Commonly known as:  ADVIL,MOTRIN Take 400 mg by mouth every 6 (six) hours as needed for mild pain.   metFORMIN 500 MG tablet Commonly known   as:  GLUCOPHAGE Take 1 tablet (500 mg total) by mouth daily with breakfast.   MULTI-VITAMIN DAILY PO Take 1 tablet by mouth at bedtime.   propranolol ER 120 MG 24 hr capsule Commonly known as:  INDERAL LA Take 1 capsule (120 mg total) by mouth at bedtime. No further refills will be provided until seen in office.   rizatriptan 10 MG disintegrating tablet Commonly known as:  MAXALT-MLT Take 1 tablet (10 mg total) 3 (three) times daily as needed by mouth for migraine.   sertraline 50 MG tablet Commonly known as:  ZOLOFT Take 1 tablet (50 mg total) by mouth at bedtime.            Objective:    BP 132/88   Pulse 71   Temp 100 F (37.8 C) (Oral)   Ht 5\' 2"  (1.575 m)   Wt 194 lb (88 kg)   BMI 35.48 kg/m   Allergies  Allergen Reactions  . Sudafed [Pseudoephedrine Hcl] Shortness Of Breath  . Amoxicillin Hives    ++ got ceftriaxone in the past++ Has patient had a PCN reaction causing immediate rash, facial/tongue/throat swelling, SOB or lightheadedness with hypotension: Yes Has patient had a PCN reaction causing severe rash involving mucus membranes or skin necrosis: No Has patient had a PCN reaction that required hospitalization: No Has patient had a PCN reaction occurring within the last 10 years: Yes If all of the above answers are "NO", then may proceed with Cephalosporin use.     Wt Readings from Last 3 Encounters:  08/17/18 194 lb (88 kg)  08/03/18 190 lb (86.2 kg)  07/31/18 192 lb (87.1 kg)    Physical Exam Vitals signs and nursing note reviewed.  Constitutional:      Appearance: She is well-developed.  HENT:     Head: Normocephalic and atraumatic.     Right Ear: Tympanic membrane and ear canal normal. No middle ear effusion.     Left Ear: Tympanic membrane and ear canal normal.     Nose: Mucosal edema present.     Right Sinus: No frontal sinus tenderness.     Left Sinus: No frontal sinus tenderness.     Mouth/Throat:     Pharynx: Oropharyngeal exudate and posterior oropharyngeal erythema present.     Tonsils: No tonsillar abscesses.  Eyes:     Conjunctiva/sclera: Conjunctivae normal.     Pupils: Pupils are equal, round, and reactive to light.  Neck:     Musculoskeletal: Normal range of motion.  Cardiovascular:     Rate and Rhythm: Normal rate and regular rhythm.     Heart sounds: Normal heart sounds.  Pulmonary:     Effort: Pulmonary effort is normal.     Breath sounds: Normal breath sounds.  Abdominal:     General: Bowel sounds are normal.     Palpations: Abdomen is soft.  Skin:    General: Skin is warm and dry.     Findings: No  rash.  Neurological:     Mental Status: She is alert and oriented to person, place, and time.     Deep Tendon Reflexes: Reflexes are normal and symmetric.  Psychiatric:        Behavior: Behavior normal.        Thought Content: Thought content normal.        Judgment: Judgment normal.     Results for orders placed or performed in visit on 08/17/18  Rapid Strep Screen ([facility] ONLY)  Result Value Ref  Range   Strep Gp A Ag, IA W/Reflex Positive (A) Negative      Assessment & Plan:   1. Sore throat - Rapid Strep A - Culture, Group A Strep - Rapid Strep Screen ([facility] ONLY)  2. Streptococcal sore throat with scarlatina - clindamycin (CLEOCIN) 300 MG capsule; Take 1 capsule (300 mg total) by mouth 3 (three) times daily.  Dispense: 30 capsule; Refill: 0   Continue all other maintenance medications as listed above.  Follow up plan: No follow-ups on file.  Educational handout given for survey  Remus Loffler PA-C [facility] [address]  [place], [place] [address] [phone]   08/19/2018, 8:39 PM

## 2018-08-19 NOTE — Progress Notes (Deleted)
BP 132/88   Pulse 71   Temp 100 F (37.8 C) (Oral)   Ht  (1.575 m)   Wt 194 lb (88 kg)   BMI 35.48 kg/m    Subjective:    Patient ID: Lauren Cardenas, female    DOB: 1986-01-28, 33 y.o.   MRN: [id number]  HPI: Lauren Cardenas is a 33 y.o. female presenting on 08/17/2018 for Sore Throat  .This patient has sore throat and had less than 2 days severe fever, chills, myalgias.  Complains of sinus headache and postnasal drainage. There is copious drainage at times. Pain with swallowing, decreased appetite and headache.  Exposure to strep.   Past Medical History:  Diagnosis Date  . Chronic migraine    neurologist-  dr Lucia Gaskins  . Constipation   . Depression 05/30/2017   with emotional eating  . Dysuria   . GERD (gastroesophageal reflux disease)   . History of acute pyelonephritis    09-27-2015:   11-04-2017  w/ sepsis due to obstruction from kidney stone  . History of kidney stones   . IC (interstitial cystitis)   . Interstitial cystitis   . Left ureteral stone   . OAB (overactive bladder)   . Pre-diabetes   . Urge urinary incontinence   . Urgency of urination   . Urinary reflux   . Vesicoureteral reflux    Relevant past medical, surgical, family and social history reviewed and updated as indicated. Interim medical history since our last visit reviewed. Allergies and medications reviewed and updated. DATA REVIEWED: CHART IN EPIC  Family History reviewed for pertinent findings.  Review of Systems  Constitutional: Positive for chills, fatigue and fever. Negative for activity change and appetite change.  HENT: Positive for congestion, postnasal drip and sore throat.   Eyes: Negative.   Respiratory: Positive for cough. Negative for wheezing.   Cardiovascular: Negative.  Negative for chest pain, palpitations and leg swelling.  Gastrointestinal: Negative.   Genitourinary: Negative.   Musculoskeletal: Negative.   Skin: Negative.   Neurological: Positive for  headaches.    Allergies as of 08/17/2018      Reactions   Sudafed [pseudoephedrine Hcl] Shortness Of Breath   Amoxicillin Hives   ++ got ceftriaxone in the past++ Has patient had a PCN reaction causing immediate rash, facial/tongue/throat swelling, SOB or lightheadedness with hypotension: Yes Has patient had a PCN reaction causing severe rash involving mucus membranes or skin necrosis: No Has patient had a PCN reaction that required hospitalization: No Has patient had a PCN reaction occurring within the last 10 years: Yes If all of the above answers are "NO", then may proceed with Cephalosporin use.      Medication List       Accurate as of August 17, 2018 11:59 PM. Always use your most recent med list.        AVIANE 0.1-20 MG-MCG tablet Generic drug:  levonorgestrel-ethinyl estradiol Take 1 tablet by mouth at bedtime.   buPROPion 200 MG 12 hr tablet Commonly known as:  WELLBUTRIN SR Take 1 tablet (200 mg total) by mouth at bedtime.   clindamycin 300 MG capsule Commonly known as:  CLEOCIN Take 1 capsule (300 mg total) by mouth 3 (three) times daily.   CRANBERRY/PROBIOTIC Tabs Take 1 tablet by mouth at bedtime.   ibuprofen 200 MG tablet Commonly known as:  ADVIL,MOTRIN Take 400 mg by mouth every 6 (six) hours as needed for mild pain.   metFORMIN 500 MG tablet Commonly known  as:  GLUCOPHAGE Take 1 tablet (500 mg total) by mouth daily with breakfast.   MULTI-VITAMIN DAILY PO Take 1 tablet by mouth at bedtime.   propranolol ER 120 MG 24 hr capsule Commonly known as:  INDERAL LA Take 1 capsule (120 mg total) by mouth at bedtime. No further refills will be provided until seen in office.   rizatriptan 10 MG disintegrating tablet Commonly known as:  MAXALT-MLT Take 1 tablet (10 mg total) 3 (three) times daily as needed by mouth for migraine.   sertraline 50 MG tablet Commonly known as:  ZOLOFT Take 1 tablet (50 mg total) by mouth at bedtime.            Objective:    BP 132/88   Pulse 71   Temp 100 F (37.8 C) (Oral)   Ht 5\' 2"  (1.575 m)   Wt 194 lb (88 kg)   BMI 35.48 kg/m   Allergies  Allergen Reactions  . Sudafed [Pseudoephedrine Hcl] Shortness Of Breath  . Amoxicillin Hives    ++ got ceftriaxone in the past++ Has patient had a PCN reaction causing immediate rash, facial/tongue/throat swelling, SOB or lightheadedness with hypotension: Yes Has patient had a PCN reaction causing severe rash involving mucus membranes or skin necrosis: No Has patient had a PCN reaction that required hospitalization: No Has patient had a PCN reaction occurring within the last 10 years: Yes If all of the above answers are "NO", then may proceed with Cephalosporin use.     Wt Readings from Last 3 Encounters:  08/17/18 194 lb (88 kg)  08/03/18 190 lb (86.2 kg)  07/31/18 192 lb (87.1 kg)    Physical Exam Vitals signs and nursing note reviewed.  Constitutional:      Appearance: She is well-developed.  HENT:     Head: Normocephalic and atraumatic.     Right Ear: Tympanic membrane and ear canal normal. No middle ear effusion.     Left Ear: Tympanic membrane and ear canal normal.     Nose: Mucosal edema present.     Right Sinus: No frontal sinus tenderness.     Left Sinus: No frontal sinus tenderness.     Mouth/Throat:     Pharynx: Oropharyngeal exudate and posterior oropharyngeal erythema present.     Tonsils: No tonsillar abscesses.  Eyes:     Conjunctiva/sclera: Conjunctivae normal.     Pupils: Pupils are equal, round, and reactive to light.  Neck:     Musculoskeletal: Normal range of motion.  Cardiovascular:     Rate and Rhythm: Normal rate and regular rhythm.     Heart sounds: Normal heart sounds.  Pulmonary:     Effort: Pulmonary effort is normal.     Breath sounds: Normal breath sounds.  Abdominal:     General: Bowel sounds are normal.     Palpations: Abdomen is soft.  Skin:    General: Skin is warm and dry.     Findings: No  rash.  Neurological:     Mental Status: She is alert and oriented to person, place, and time.     Deep Tendon Reflexes: Reflexes are normal and symmetric.  Psychiatric:        Behavior: Behavior normal.        Thought Content: Thought content normal.        Judgment: Judgment normal.     Results for orders placed or performed in visit on 08/17/18  Rapid Strep Screen ([facility] ONLY)  Result Value Ref  Range   Strep Gp A Ag, IA W/Reflex Positive (A) Negative      Assessment & Plan:   1. Sore throat - Rapid Strep A - Culture, Group A Strep - Rapid Strep Screen ([facility] ONLY)  2. Streptococcal sore throat with scarlatina - clindamycin (CLEOCIN) 300 MG capsule; Take 1 capsule (300 mg total) by mouth 3 (three) times daily.  Dispense: 30 capsule; Refill: 0   Continue all other maintenance medications as listed above.  Follow up plan: No follow-ups on file.  Educational handout given for survey  Remus Loffler PA-C [facility] [address]  [place], [place] [address] [phone]   08/19/2018, 8:39 PM

## 2018-08-31 ENCOUNTER — Encounter: Payer: Self-pay | Admitting: Family Medicine

## 2018-08-31 ENCOUNTER — Ambulatory Visit: Payer: Medicaid Other | Admitting: Family Medicine

## 2018-08-31 VITALS — BP 118/74 | HR 76 | Temp 99.9°F | Ht 62.0 in | Wt 193.0 lb

## 2018-08-31 DIAGNOSIS — E669 Obesity, unspecified: Secondary | ICD-10-CM

## 2018-08-31 DIAGNOSIS — E7849 Other hyperlipidemia: Secondary | ICD-10-CM | POA: Diagnosis not present

## 2018-08-31 DIAGNOSIS — K219 Gastro-esophageal reflux disease without esophagitis: Secondary | ICD-10-CM | POA: Diagnosis not present

## 2018-08-31 DIAGNOSIS — R062 Wheezing: Secondary | ICD-10-CM | POA: Insufficient documentation

## 2018-08-31 DIAGNOSIS — R7303 Prediabetes: Secondary | ICD-10-CM

## 2018-08-31 DIAGNOSIS — Z0000 Encounter for general adult medical examination without abnormal findings: Secondary | ICD-10-CM | POA: Diagnosis not present

## 2018-08-31 DIAGNOSIS — G43009 Migraine without aura, not intractable, without status migrainosus: Secondary | ICD-10-CM | POA: Diagnosis not present

## 2018-08-31 DIAGNOSIS — Z6831 Body mass index (BMI) 31.0-31.9, adult: Secondary | ICD-10-CM | POA: Diagnosis not present

## 2018-08-31 DIAGNOSIS — Z0001 Encounter for general adult medical examination with abnormal findings: Secondary | ICD-10-CM | POA: Diagnosis not present

## 2018-08-31 LAB — BAYER DCA HB A1C WAIVED: HB A1C (BAYER DCA - WAIVED): 6.1 % (ref <7.0)

## 2018-08-31 MED ORDER — FLUTICASONE PROPIONATE HFA 44 MCG/ACT IN AERO: 1.0000 | INHALATION_SPRAY | Freq: Two times a day (BID) | RESPIRATORY_TRACT | 12 refills | Status: DC

## 2018-08-31 MED ORDER — ALBUTEROL SULFATE HFA 108 (90 BASE) MCG/ACT IN AERS: 2.0000 | INHALATION_SPRAY | Freq: Four times a day (QID) | RESPIRATORY_TRACT | 2 refills | Status: DC | PRN

## 2018-08-31 MED ORDER — FAMOTIDINE 20 MG PO TABS: 20.0000 mg | ORAL_TABLET | Freq: Two times a day (BID) | ORAL | 3 refills | Status: DC

## 2018-08-31 MED ORDER — PROPRANOLOL HCL ER 120 MG PO CP24: 120.0000 mg | ORAL_CAPSULE | Freq: Every day | ORAL | 2 refills | Status: DC

## 2018-08-31 MED ORDER — FLUTICASONE PROPIONATE (INHAL) 50 MCG/BLIST IN AEPB: 1.0000 | INHALATION_SPRAY | Freq: Two times a day (BID) | RESPIRATORY_TRACT | 12 refills | Status: DC

## 2018-08-31 NOTE — Patient Instructions (Signed)

## 2018-08-31 NOTE — Progress Notes (Signed)
Subjective:  Patient ID: Lauren Cardenas, female    DOB: 02/22/1986, 33 y.o.   MRN: [id number]  Chief Complaint:  Annual Exam   HPI: Lauren Cardenas is a 33 y.o. female presenting on 08/31/2018 for Annual Exam   1. Annual physical exam  Doing well overall. States she is really enjoying her new job. States she is doing great on her anxiety / depression medications. She does not diet or exercise on a regular basis. States she will work on this.    2. Class 1 obesity with serious comorbidity and body mass index (BMI) of 31.0 to 31.9 in adult, unspecified obesity type  Does not diet or exercise on a regular basis.    3. GERD without esophagitis  Ongoing for several months. Is worse with certain foods and at night. States she is not on any medications on a regular basis, states symptoms are somewhat controlled with Maalox. She denies cough, chest pain, sore throat, hemoptysis, or changes in voice. States she just has burning in her epigastric area, 4/10 at worst.    4. Prediabetes  On metformin daily and tolerating well. Does not check blood sugar at home. No polydipsia, polyuria, or polyphagia.    5. Other hyperlipidemia  Diet controlled. Does not diet and exercise on a regular basis. No chest pain, palpitations, lower extremity edema, or abdominal pain.    6. Wheezing  This is a new but chronic problem. States she wheezes on a daily basis. States she does have shortness of breath with exertion. States she has never been diagnosed with asthma, but it runs in her family. States she does not have a cough. States the wheezing is worse at night and with seasonal changes. States her husband can hear her wheezing at night. She denies night time awakenings.    7. Migraine without aura and without status migrainosus, not intractable  Well controlled on propanolol, has not had to use abortive medications in several months.      Relevant past medical, surgical, family, and social history  reviewed and updated as indicated.  Allergies and medications reviewed and updated.   Past Medical History:  Diagnosis Date  . Chronic migraine    neurologist-  dr Jaynee Eagles  . Constipation   . Depression 05/30/2017   with emotional eating  . Dysuria   . GERD (gastroesophageal reflux disease)   . History of acute pyelonephritis    09-27-2015:   11-04-2017  w/ sepsis due to obstruction from kidney stone  . History of kidney stones   . IC (interstitial cystitis)   . Interstitial cystitis   . Left ureteral stone   . OAB (overactive bladder)   . Pre-diabetes   . Urge urinary incontinence   . Urgency of urination   . Urinary reflux   . Vesicoureteral reflux     Past Surgical History:  Procedure Laterality Date  . CHOLECYSTECTOMY N/A 10/26/2017   Procedure: LAPAROSCOPIC CHOLECYSTECTOMY WITH INTRAOPERATIVE CHOLANGIOGRAM ERAS PATHWAY;  Surgeon: Erroll Luna, MD;  Location: [facility];  Service: General;  Laterality: N/A;  . CYSTOSCOPY W/ URETERAL STENT PLACEMENT Right 09/27/2015   Procedure: CYSTOSCOPY WITH RETROGRADE PYELOGRAM/URETERAL STENT PLACEMENT;  Surgeon: Nickie Retort, MD;  Location: [facility];  Service: Urology;  Laterality: Right;  . CYSTOSCOPY WITH RETROGRADE PYELOGRAM, URETEROSCOPY AND STENT PLACEMENT Left 09/09/2014   Procedure: CYSTOSCOPY WITH LEFT RETROGRADE PYELOGRAM, URETEROSCOPY AND STONE EXTRACTION  DOUBLE J STENT PLACEMENT;  Surgeon: Festus Aloe, MD;  Location: [facility];  Service: Urology;  Laterality: Left;  . CYSTOSCOPY WITH STENT PLACEMENT Left 11/04/2017   Procedure: CYSTOSCOPY WITH RETROGRADE AND LEFT STENT PLACEMENT;  Surgeon: Lucas Mallow, MD;  Location: [facility];  Service: Urology;  Laterality: Left;  . CYSTOSCOPY/URETEROSCOPY/HOLMIUM LASER/STENT PLACEMENT Right 10/16/2015   Procedure: RIGHT URETEROSCOPY/HOLMIUM LASER/STENT PLACEMENT;  Surgeon: Festus Aloe, MD;  Location: [facility];  Service: Urology;  Laterality: Right;  .  CYSTOSCOPY/URETEROSCOPY/HOLMIUM LASER/STENT PLACEMENT Left 11/21/2017   Procedure: CYSTOSCOPY/URETEROSCOPY/HOLMIUM LASER/STENT PLACEMENT;  Surgeon: Festus Aloe, MD;  Location: [facility];  Service: Urology;  Laterality: Left;  ONLY NEEDS 60 MIN  . HOLMIUM LASER APPLICATION Left 5/62/1308   Procedure: HOLMIUM LASER APPLICATION;  Surgeon: Festus Aloe, MD;  Location: [facility];  Service: Urology;  Laterality: Left;  . HOLMIUM LASER APPLICATION Right 6/57/8469   Procedure: HOLMIUM LASER APPLICATION;  Surgeon: Festus Aloe, MD;  Location: [facility];  Service: Urology;  Laterality: Right;  . STONE EXTRACTION WITH BASKET Right 10/16/2015   Procedure: STONE EXTRACTION WITH BASKET;  Surgeon: Festus Aloe, MD;  Location: [facility];  Service: Urology;  Laterality: Right;  . WISDOM TOOTH EXTRACTION  03/2014    Social History   Socioeconomic History  . Marital status: Married    Spouse name: Broadus John  . Number of children: 1  . Years of education: 49  . Highest education level: Not on file  Occupational History  . 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 Financial resource strain: Not on file  . Food insecurity:    Worry: Not on file    Inability: Not on file  . Transportation needs:    Medical: Not on file    Non-medical: Not on file  Tobacco Use  . Smoking status: Never Smoker  . Smokeless tobacco: Never Used  Substance and Sexual Activity  . Alcohol use: No  . Drug use: No  . Sexual activity: Yes    Birth control/protection: None  Lifestyle  . Physical activity:    Days per week: Not on file    Minutes per session: Not on file  . Stress: Not on file  Relationships  . Social connections:    Talks on phone: Not on file    Gets together: Not on file    Attends religious service: Not on file    Active member of club or organization: Not on file    Attends meetings of clubs or organizations: Not on file     Relationship status: Not on file  . Intimate partner violence:    Fear of current or ex partner: Not on file    Emotionally abused: Not on file    Physically abused: Not on file    Forced sexual activity: Not on file  Other Topics Concern  . Not on file  Social History Narrative   Lives with husband and daughter and in-laws   Caffeine use: 100-146m/week   She drinks about 3 cups of diet caffeine soda per week   She has been going to [facility]   Outpatient Encounter Medications as of 08/31/2018  Medication Sig  . AVIANE 0.1-20 MG-MCG tablet Take 1 tablet by mouth at bedtime.   .Marland KitchenbuPROPion (WELLBUTRIN SR) 200 MG 12 hr tablet Take 1 tablet (200 mg total) by mouth at bedtime.  .Marland Kitchenibuprofen (ADVIL,MOTRIN) 200 MG tablet Take 400 mg by mouth every 6 (six) hours as needed for mild pain.  .Marland Kitchen  metFORMIN (GLUCOPHAGE) 500 MG tablet Take 1 tablet (500 mg total) by mouth daily with breakfast.  . Misc Natural Products (CRANBERRY/PROBIOTIC) TABS Take 1 tablet by mouth at bedtime.  . Multiple Vitamin (MULTI-VITAMIN DAILY PO) Take 1 tablet by mouth at bedtime.   . propranolol ER (INDERAL LA) 120 MG 24 hr capsule Take 1 capsule (120 mg total) by mouth at bedtime. No further refills will be provided until seen in office.  . rizatriptan (MAXALT-MLT) 10 MG disintegrating tablet Take 1 tablet (10 mg total) 3 (three) times daily as needed by mouth for migraine.  . sertraline (ZOLOFT) 50 MG tablet Take 1 tablet (50 mg total) by mouth at bedtime.  . [DISCONTINUED] propranolol ER (INDERAL LA) 120 MG 24 hr capsule Take 1 capsule (120 mg total) by mouth at bedtime. No further refills will be provided until seen in office.  Marland Kitchen albuterol (PROVENTIL HFA;VENTOLIN HFA) 108 (90 Base) MCG/ACT inhaler Inhale 2 puffs into the lungs every 6 (six) hours as needed for wheezing or shortness of breath.  . famotidine (PEPCID) 20 MG tablet Take 1 tablet (20 mg total) by mouth 2 (two) times daily for 30 days.  .  fluticasone (FLOVENT HFA) 44 MCG/ACT inhaler Inhale 1 puff into the lungs 2 (two) times daily.  . [DISCONTINUED] clindamycin (CLEOCIN) 300 MG capsule Take 1 capsule (300 mg total) by mouth 3 (three) times daily.  . [DISCONTINUED] fluticasone (FLOVENT DISKUS) 50 MCG/BLIST diskus inhaler Inhale 1 puff into the lungs 2 (two) times daily.   No facility-administered encounter medications on file as of 08/31/2018.     Allergies  Allergen Reactions  . Sudafed [Pseudoephedrine Hcl] Shortness Of Breath  . Amoxicillin Hives    ++ got ceftriaxone in the past++ Has patient had a PCN reaction causing immediate rash, facial/tongue/throat swelling, SOB or lightheadedness with hypotension: Yes Has patient had a PCN reaction causing severe rash involving mucus membranes or skin necrosis: No Has patient had a PCN reaction that required hospitalization: No Has patient had a PCN reaction occurring within the last 10 years: Yes If all of the above answers are "NO", then may proceed with Cephalosporin use.     Review of Systems  Constitutional: Negative for appetite change, chills, fatigue, fever and unexpected weight change.  HENT: Negative for congestion, sore throat, trouble swallowing and voice change.   Eyes: Negative for photophobia and visual disturbance.  Respiratory: Positive for shortness of breath (with exertion) and wheezing. Negative for apnea, cough, choking, chest tightness and stridor.   Cardiovascular: Negative for chest pain, palpitations and leg swelling.  Gastrointestinal: Positive for abdominal pain (epigastric burning). Negative for abdominal distention, anal bleeding, blood in stool, constipation, diarrhea, nausea, rectal pain and vomiting.  Endocrine: Negative for polydipsia, polyphagia and polyuria.  Genitourinary: Negative for decreased urine volume, difficulty urinating, dyspareunia, dysuria, enuresis, flank pain, frequency, genital sores, hematuria, menstrual problem, pelvic pain,  urgency, vaginal bleeding, vaginal discharge and vaginal pain.  Musculoskeletal: Negative for arthralgias and back pain.  Skin: Negative for color change, pallor, rash and wound.  Neurological: Negative for dizziness, tremors, seizures, syncope, facial asymmetry, speech difficulty, weakness, light-headedness, numbness and headaches.  Hematological: Negative for adenopathy. Does not bruise/bleed easily.  Psychiatric/Behavioral: Negative for confusion, decreased concentration, dysphoric mood and sleep disturbance. The patient is not nervous/anxious.   All other systems reviewed and are negative.       Objective:  BP 118/74   Pulse 76   Temp 99.9 F (37.7 C) (Oral)   Ht 5'  2" (1.575 m)   Wt 193 lb (87.5 kg)   BMI 35.30 kg/m    Wt Readings from Last 3 Encounters:  08/31/18 193 lb (87.5 kg)  08/17/18 194 lb (88 kg)  08/03/18 190 lb (86.2 kg)    Physical Exam Vitals signs and nursing note reviewed.  Constitutional:      General: She is not in acute distress.    Appearance: Normal appearance. She is well-developed and well-groomed. She is obese. She is not ill-appearing, toxic-appearing or diaphoretic.  HENT:     Head: Normocephalic and atraumatic.     Jaw: There is normal jaw occlusion.     Right Ear: Hearing, tympanic membrane, ear canal and external ear normal.     Left Ear: Hearing, tympanic membrane, ear canal and external ear normal.     Nose: Nose normal.     Mouth/Throat:     Lips: Pink.     Mouth: Mucous membranes are moist.     Pharynx: Oropharynx is clear. Uvula midline.  Eyes:     General: Lids are normal.     Extraocular Movements: Extraocular movements intact.     Conjunctiva/sclera: Conjunctivae normal.     Pupils: Pupils are equal, round, and reactive to light.  Neck:     Musculoskeletal: Normal range of motion and neck supple. No neck rigidity or muscular tenderness.     Thyroid: No thyroid mass, thyromegaly or thyroid tenderness.     Vascular: No carotid  bruit or JVD.     Trachea: Trachea and phonation normal.  Cardiovascular:     Rate and Rhythm: Normal rate and regular rhythm.     Chest Wall: PMI is not displaced.     Pulses: Normal pulses.     Heart sounds: Normal heart sounds. No murmur. No friction rub. No gallop.   Pulmonary:     Effort: Pulmonary effort is normal.     Breath sounds: Wheezing (mild, scattered) present.  Abdominal:     General: Abdomen is protuberant. Bowel sounds are normal. There is no distension or abdominal bruit.     Palpations: Abdomen is soft. There is no hepatomegaly or splenomegaly.     Tenderness: There is no abdominal tenderness. There is no right CVA tenderness or left CVA tenderness.     Hernia: No hernia is present.  Musculoskeletal:     Right lower leg: No edema.     Left lower leg: No edema.  Lymphadenopathy:     Cervical: No cervical adenopathy.  Skin:    General: Skin is warm and dry.     Capillary Refill: Capillary refill takes less than 2 seconds.     Coloration: Skin is not jaundiced or pale.     Findings: No lesion or rash.  Neurological:     General: No focal deficit present.     Mental Status: She is alert.     Cranial Nerves: Cranial nerves are intact.     Sensory: Sensation is intact.     Motor: Motor function is intact.     Coordination: Coordination is intact.     Gait: Gait is intact.     Deep Tendon Reflexes: Reflexes are normal and symmetric.  Psychiatric:        Attention and Perception: Attention and perception normal.        Mood and Affect: Mood and affect normal.        Speech: Speech normal.        Behavior: Behavior is cooperative.  Thought Content: Thought content normal.        Cognition and Memory: Cognition and memory normal.        Judgment: Judgment normal.     Results for orders placed or performed in visit on 08/17/18  Rapid Strep Screen ([facility] ONLY)  Result Value Ref Range   Strep Gp A Ag, IA W/Reflex Positive (A) Negative        Pertinent labs & imaging results that were available during my care of the patient were reviewed by me and considered in my medical decision making.  Assessment & Plan:  Aletha was seen today for annual exam.  Diagnoses and all orders for this visit:  Annual physical exam PAP and breast exam completed by GYN, will get records. Health maintenance discussed. Diet and exercise encouraged.  -     CMP14+EGFR -     CBC with Differential/Platelet -     Bayer DCA Hb A1c Waived  Class 1 obesity with serious comorbidity and body mass index (BMI) of 31.0 to 31.9 in adult, unspecified obesity type Diet and exercise encouraged.  -     Lipid panel -     TSH -     CMP14+EGFR -     CBC with Differential/Platelet -     Bayer DCA Hb A1c Waived  GERD without esophagitis Will place on Pepcid. If not beneficial, may need step up therapy.  -     famotidine (PEPCID) 20 MG tablet; Take 1 tablet (20 mg total) by mouth 2 (two) times daily for 30 days.  Prediabetes Labs pending. Diet and exercise encouraged.  -     Bayer DCA Hb A1c Waived  Other hyperlipidemia Labs pending. Diet and exercise encouraged.  -     CMP14+EGFR       -     Lipid panel  Wheezing Unable to complete spirometry in office (machine malfunction). Will place on low dose ICS daily due to daily symptoms. Report any new or worsening symptoms. Reevaluation in 3 months.  -     PR BREATHING CAPACITY TEST -     albuterol (PROVENTIL HFA;VENTOLIN HFA) 108 (90 Base) MCG/ACT inhaler; Inhale 2 puffs into the lungs every 6 (six) hours as needed for wheezing or shortness of breath. -     fluticasone (FLOVENT HFA) 44 MCG/ACT inhaler; Inhale 1 puff into the lungs 2 (two) times daily.  Migraine without aura and without status migrainosus, not intractable Stable, continue below.  -     propranolol ER (INDERAL LA) 120 MG 24 hr capsule; Take 1 capsule (120 mg total) by mouth at bedtime. No further refills will be provided until seen in  office.   Continue all other maintenance medications.  Follow up plan: Return in 3 months (on 12/01/2018) for GERD, wheezing.  Educational handout given for health maintenance  The above assessment and management plan was discussed with the patient. The patient verbalized understanding of and has agreed to the management plan. Patient is aware to call the clinic if symptoms persist or worsen. Patient is aware when to return to the clinic for a follow-up visit. Patient educated on when it is appropriate to go to the emergency department.   Monia Pouch, FNP-C [place] [phone]'

## 2018-09-01 LAB — CMP14+EGFR
ALT: 20 IU/L (ref 0–32)
AST: 23 IU/L (ref 0–40)
Albumin/Globulin Ratio: 1.3 (ref 1.2–2.2)
Albumin: 4.1 g/dL (ref 3.8–4.8)
Alkaline Phosphatase: 54 IU/L (ref 39–117)
BUN/Creatinine Ratio: 17 (ref 9–23)
BUN: 14 mg/dL (ref 6–20)
Bilirubin Total: 0.3 mg/dL (ref 0.0–1.2)
CO2: 21 mmol/L (ref 20–29)
Calcium: 9.4 mg/dL (ref 8.7–10.2)
Chloride: 104 mmol/L (ref 96–106)
Creatinine, Ser: 0.84 mg/dL (ref 0.57–1.00)
GFR calc Af Amer: 105 mL/min/{1.73_m2} (ref >59)
GFR calc non Af Amer: 91 mL/min/{1.73_m2} (ref >59)
Globulin, Total: 3.1 g/dL (ref 1.5–4.5)
Glucose: 72 mg/dL (ref 65–99)
Potassium: 5.2 mmol/L (ref 3.5–5.2)
Sodium: 142 mmol/L (ref 134–144)
TOTAL PROTEIN: 7.2 g/dL (ref 6.0–8.5)

## 2018-09-01 LAB — LIPID PANEL
CHOLESTEROL TOTAL: 202 mg/dL — AB (ref 100–199)
Chol/HDL Ratio: 3.3 ratio (ref 0.0–4.4)
HDL: 62 mg/dL (ref >39)
LDL Calculated: 93 mg/dL (ref 0–99)
Triglycerides: 235 mg/dL — ABNORMAL HIGH (ref 0–149)
VLDL Cholesterol Cal: 47 mg/dL — ABNORMAL HIGH (ref 5–40)

## 2018-09-01 LAB — CBC WITH DIFFERENTIAL/PLATELET
Basophils Absolute: 0.1 10*3/uL (ref 0.0–0.2)
Basos: 1 %
EOS (ABSOLUTE): 1.1 10*3/uL — ABNORMAL HIGH (ref 0.0–0.4)
EOS: 7 %
HEMOGLOBIN: 13 g/dL (ref 11.1–15.9)
Hematocrit: 40.1 % (ref 34.0–46.6)
IMMATURE GRANS (ABS): 0.1 10*3/uL (ref 0.0–0.1)
Immature Granulocytes: 1 %
LYMPHS: 20 %
Lymphocytes Absolute: 3 10*3/uL (ref 0.7–3.1)
MCH: 26.4 pg — ABNORMAL LOW (ref 26.6–33.0)
MCHC: 32.4 g/dL (ref 31.5–35.7)
MCV: 82 fL (ref 79–97)
MONOCYTES: 5 %
Monocytes Absolute: 0.8 10*3/uL (ref 0.1–0.9)
Neutrophils Absolute: 9.8 10*3/uL — ABNORMAL HIGH (ref 1.4–7.0)
Neutrophils: 66 %
Platelets: 279 10*3/uL (ref 150–450)
RBC: 4.92 x10E6/uL (ref 3.77–5.28)
RDW: 13.9 % (ref 11.7–15.4)
WBC: 14.9 10*3/uL — ABNORMAL HIGH (ref 3.4–10.8)

## 2018-09-01 LAB — TSH: TSH: 1.45 u[IU]/mL (ref 0.450–4.500)

## 2018-09-03 ENCOUNTER — Other Ambulatory Visit: Payer: Self-pay

## 2018-09-03 DIAGNOSIS — D72829 Elevated white blood cell count, unspecified: Secondary | ICD-10-CM

## 2018-09-06 ENCOUNTER — Encounter: Payer: Self-pay | Admitting: Physician Assistant

## 2018-09-06 DIAGNOSIS — F411 Generalized anxiety disorder: Secondary | ICD-10-CM

## 2018-09-06 DIAGNOSIS — F330 Major depressive disorder, recurrent, mild: Secondary | ICD-10-CM

## 2018-09-06 DIAGNOSIS — R7303 Prediabetes: Secondary | ICD-10-CM

## 2018-09-06 MED ORDER — METFORMIN HCL 500 MG PO TABS: 500.0000 mg | ORAL_TABLET | Freq: Every day | ORAL | 0 refills | Status: DC

## 2018-09-06 MED ORDER — RIZATRIPTAN BENZOATE 10 MG PO TBDP: 10.0000 mg | ORAL_TABLET | Freq: Three times a day (TID) | ORAL | 11 refills | Status: AC | PRN

## 2018-09-06 MED ORDER — BUPROPION HCL ER (SR) 200 MG PO TB12: 200.0000 mg | ORAL_TABLET | Freq: Every day | ORAL | 3 refills | Status: DC

## 2018-09-08 ENCOUNTER — Other Ambulatory Visit: Payer: Self-pay

## 2018-09-08 ENCOUNTER — Ambulatory Visit: Payer: Medicaid Other | Admitting: Family Medicine

## 2018-09-08 ENCOUNTER — Encounter: Payer: Self-pay | Admitting: Family Medicine

## 2018-09-08 VITALS — BP 120/77 | HR 72 | Temp 97.2°F | Ht 62.0 in | Wt 192.0 lb

## 2018-09-08 DIAGNOSIS — J028 Acute pharyngitis due to other specified organisms: Secondary | ICD-10-CM

## 2018-09-08 DIAGNOSIS — B9789 Other viral agents as the cause of diseases classified elsewhere: Secondary | ICD-10-CM

## 2018-09-08 DIAGNOSIS — J029 Acute pharyngitis, unspecified: Secondary | ICD-10-CM | POA: Diagnosis not present

## 2018-09-08 LAB — CULTURE, GROUP A STREP

## 2018-09-08 LAB — RAPID STREP SCREEN (MED CTR MEBANE ONLY): Strep Gp A Ag, IA W/Reflex: NEGATIVE

## 2018-09-08 NOTE — Patient Instructions (Signed)
Pharyngitis  Pharyngitis is a sore throat (pharynx). This is when there is redness, pain, and swelling in your throat. Most of the time, this condition gets better on its own. In some cases, you may need medicine. Follow these instructions at home:  Take over-the-counter and prescription medicines only as told by your doctor. ? If you were prescribed an antibiotic medicine, take it as told by your doctor. Do not stop taking the antibiotic even if you start to feel better. ? Do not give children aspirin. Aspirin has been linked to Reye syndrome.  Drink enough water and fluids to keep your pee (urine) clear or pale yellow.  Get a lot of rest.  Rinse your mouth (gargle) with a salt-water mixture 3-4 times a day or as needed. To make a salt-water mixture, completely dissolve -1 tsp of salt in 1 cup of warm water.  If your doctor approves, you may use throat lozenges or sprays to soothe your throat. Contact a doctor if:  You have large, tender lumps in your neck.  You have a rash.  You cough up green, yellow-brown, or bloody spit. Get help right away if:  You have a stiff neck.  You drool or cannot swallow liquids.  You cannot drink or take medicines without throwing up.  You have very bad pain that does not go away with medicine.  You have problems breathing, and it is not from a stuffy nose.  You have new pain and swelling in your knees, ankles, wrists, or elbows. Summary  Pharyngitis is a sore throat (pharynx). This is when there is redness, pain, and swelling in your throat.  If you were prescribed an antibiotic medicine, take it as told by your doctor. Do not stop taking the antibiotic even if you start to feel better.  Most of the time, pharyngitis gets better on its own. Sometimes, you may need medicine. This information is not intended to replace advice given to you by your health care provider. Make sure you discuss any questions you have with your health care  provider. Document Released: 11/30/2007 Document Revised: 07/19/2016 Document Reviewed: 07/19/2016 Elsevier Interactive Patient Education  2019 Elsevier Inc.  

## 2018-09-08 NOTE — Progress Notes (Signed)
    Lauren Cardenas is a 33 y.o. female presenting with a sore throat for 3 days.  Associated symptoms include:  chills.  Symptoms are constant.  Home treatment thus far includes:  rest, hydration, NSAIDS/acetaminophen and lozenges.  No known sick contacts with similar symptoms.  There is a previous history of of similar symptoms. Recently treated for strep pharyngitis. Symptoms resolved for several days and then returned per pt. States pain is burning and scratchy, 4/10.  Medical history, surgeries, social history, medications, and allergies reviewed.  Review of Systems  Constitutional: Positive for chills. Negative for diaphoresis, fever, malaise/fatigue and weight loss.  HENT: Positive for sore throat.   Respiratory: Negative for cough, sputum production and shortness of breath.   Cardiovascular: Negative for chest pain, palpitations and leg swelling.  Gastrointestinal: Negative for abdominal pain, nausea and vomiting.  Skin: Negative for rash.  Neurological: Negative for headaches.  All other systems reviewed and are negative.    Exam:  BP 120/77   Pulse 72   Temp (!) 97.2 F (36.2 C) (Oral)   Ht 5\' 2"  (1.575 m)   Wt 192 lb (87.1 kg)   BMI 35.12 kg/m  Physical Exam  Constitutional: She is oriented to person, place, and time and well-developed, well-nourished, and in no distress. She appears not dehydrated.  Non-toxic appearance. She does not have a sickly appearance. No distress.  HENT:  Head: Normocephalic and atraumatic.  Right Ear: Hearing, tympanic membrane, external ear and ear canal normal.  Left Ear: Hearing, tympanic membrane, external ear and ear canal normal.  Nose: Nose normal.  Mouth/Throat: Uvula is midline and mucous membranes are normal. Posterior oropharyngeal erythema (mild) present. No oropharyngeal exudate, posterior oropharyngeal edema or tonsillar abscesses.  Eyes: Pupils are equal, round, and reactive to light. Conjunctivae are normal. Right eye  exhibits no discharge. Left eye exhibits no discharge. No scleral icterus.  Neck: Normal range of motion. Neck supple. No JVD present. No tracheal deviation present. No thyromegaly present.  Cardiovascular: Normal rate and regular rhythm. Exam reveals friction rub. Exam reveals no gallop.  No murmur heard. Pulmonary/Chest: Effort normal and breath sounds normal.  Lymphadenopathy:    She has no cervical adenopathy.  Neurological: She is alert and oriented to person, place, and time.  Skin: Skin is warm and dry. No rash noted. She is not diaphoretic. No erythema. No pallor.  Psychiatric: Mood, memory, affect and judgment normal.    Rapid strep negative  Assessment and Plan  Ketzia was seen today for sore throat.  Diagnoses and all orders for this visit:  Sore throat Rapid strep negative, will culture due to recurrence of symptoms.  -     Rapid Strep Screen ([facility] ONLY) -     Culture, Group A Strep  Sore throat (viral) Symptomatic care discussed. Report any new or worsening symptoms.    Return if symptoms worsen or fail to improve.  The above assessment and management plan was discussed with the patient. The patient verbalized understanding of and has agreed to the management plan. Patient is aware to call the clinic if symptoms fail to improve or worsen. Patient is aware when to return to the clinic for a follow-up visit. Patient educated on when it is appropriate to go to the emergency department.   Kari Baars, FNP-C [facility] [address] [place], [place] [address] [phone]

## 2018-09-10 LAB — CULTURE, GROUP A STREP: Strep A Culture: NEGATIVE

## 2018-09-12 ENCOUNTER — Other Ambulatory Visit: Payer: Self-pay

## 2018-09-13 ENCOUNTER — Inpatient Hospital Stay (HOSPITAL_COMMUNITY): Payer: Medicaid Other | Attending: Hematology | Admitting: Hematology

## 2018-09-13 ENCOUNTER — Encounter (HOSPITAL_COMMUNITY): Payer: Self-pay | Admitting: Hematology

## 2018-09-13 ENCOUNTER — Inpatient Hospital Stay (HOSPITAL_COMMUNITY): Payer: Medicaid Other

## 2018-09-13 VITALS — BP 139/90 | HR 74 | Temp 98.4°F | Ht 61.0 in | Wt 191.6 lb

## 2018-09-13 DIAGNOSIS — D72829 Elevated white blood cell count, unspecified: Secondary | ICD-10-CM

## 2018-09-13 LAB — TSH: TSH: 1.509 u[IU]/mL (ref 0.350–4.500)

## 2018-09-13 LAB — CBC WITH DIFFERENTIAL/PLATELET
BAND NEUTROPHILS: 0 %
Basophils Absolute: 0 10*3/uL (ref 0.0–0.1)
Basophils Relative: 0 %
Blasts: 0 %
Eosinophils Absolute: 0.4 10*3/uL (ref 0.0–0.5)
Eosinophils Relative: 3 %
HCT: 41.9 % (ref 36.0–46.0)
Hemoglobin: 13 g/dL (ref 12.0–15.0)
LYMPHS ABS: 3.1 10*3/uL (ref 0.7–4.0)
Lymphocytes Relative: 23 %
MCH: 26.2 pg (ref 26.0–34.0)
MCHC: 31 g/dL (ref 30.0–36.0)
MCV: 84.3 fL (ref 80.0–100.0)
Metamyelocytes Relative: 0 %
Monocytes Absolute: 0.8 10*3/uL (ref 0.1–1.0)
Monocytes Relative: 6 %
Myelocytes: 0 %
Neutro Abs: 9 10*3/uL — ABNORMAL HIGH (ref 1.7–7.7)
Neutrophils Relative %: 68 %
Other: 0 %
Platelets: 316 10*3/uL (ref 150–400)
Promyelocytes Relative: 0 %
RBC: 4.97 MIL/uL (ref 3.87–5.11)
RDW: 13.4 % (ref 11.5–15.5)
WBC: 13.3 10*3/uL — ABNORMAL HIGH (ref 4.0–10.5)
nRBC: 0 % (ref 0.0–0.2)
nRBC: 0 /100 WBC

## 2018-09-13 LAB — COMPREHENSIVE METABOLIC PANEL
ALK PHOS: 46 U/L (ref 38–126)
ALT: 38 U/L (ref 0–44)
AST: 32 U/L (ref 15–41)
Albumin: 4.1 g/dL (ref 3.5–5.0)
Anion gap: 8 (ref 5–15)
BUN: 15 mg/dL (ref 6–20)
CALCIUM: 9.2 mg/dL (ref 8.9–10.3)
CO2: 26 mmol/L (ref 22–32)
Chloride: 106 mmol/L (ref 98–111)
Creatinine, Ser: 0.78 mg/dL (ref 0.44–1.00)
GFR calc Af Amer: >60 mL/min (ref >60)
GFR calc non Af Amer: >60 mL/min (ref >60)
Glucose, Bld: 85 mg/dL (ref 70–99)
Potassium: 3.8 mmol/L (ref 3.5–5.1)
Sodium: 140 mmol/L (ref 135–145)
Total Bilirubin: 0.4 mg/dL (ref 0.3–1.2)
Total Protein: 8.1 g/dL (ref 6.5–8.1)

## 2018-09-13 LAB — RETICULOCYTES
Immature Retic Fract: 8.5 % (ref 2.3–15.9)
RBC.: 4.97 MIL/uL (ref 3.87–5.11)
Retic Count, Absolute: 97.4 10*3/uL (ref 19.0–186.0)
Retic Ct Pct: 2 % (ref 0.4–3.1)

## 2018-09-13 LAB — VITAMIN B12: VITAMIN B 12: 399 pg/mL (ref 180–914)

## 2018-09-13 LAB — SAVE SMEAR(SSMR), FOR PROVIDER SLIDE REVIEW

## 2018-09-13 LAB — LACTATE DEHYDROGENASE: LDH: 131 U/L (ref 98–192)

## 2018-09-13 NOTE — Patient Instructions (Addendum)
[facility] at [facility] Discharge Instructions  You were seen today by Dr. Ellin Saba. He went over your recent scan results. He would like for you to have blood work today. He will see you back in 3-4 weeks for follow up.   Thank you for choosing [facility] at [facility] to provide your oncology and hematology care.  To afford each patient quality time with our provider, please arrive at least 15 minutes before your scheduled appointment time.   If you have a lab appointment with the Cancer Center please come in thru the  Main Entrance and check in at the main information desk  You need to re-schedule your appointment should you arrive 10 or more minutes late.  We strive to give you quality time with our providers, and arriving late affects you and other patients whose appointments are after yours.  Also, if you no show three or more times for appointments you may be dismissed from the clinic at the providers discretion.     Again, thank you for choosing [facility].  Our hope is that these requests will decrease the amount of time that you wait before being seen by our physicians.       _____________________________________________________________  Should you have questions after your visit to [facility], please contact our office at [phone] between the hours of 8:00 a.m. and 4:30 p.m.  Voicemails left after 4:00 p.m. will not be returned until the following business day.  For prescription refill requests, have your pharmacy contact our office and allow 72 hours.    Cancer Center Support Programs:   > Cancer Support Group  2nd Tuesday of the month 1pm-2pm, Journey Room

## 2018-09-13 NOTE — Assessment & Plan Note (Signed)
1.  Leukocytosis: - Patient has leukocytosis, predominantly neutrophilic and occasional monocytosis since 2013. -Denies any recurrent infections.  Patient is never smoker. -Denies any chronic steroid use. - CT abdomen and pelvis with contrast on 10/07/2017 shows cholelithiasis, normal spleen and liver. -CT chest PE protocol on 10/31/2017 was negative for PE and other abnormalities. -Physical examination does not reveal any abnormalities.  Denies any fevers, night sweats or weight loss. - We had a prolonged discussion about etiology of leukocytosis.  We would like to repeat a CBC today and review her smear.  We will check for BCR/ABL by FISH to rule out CML.  We will also send a Jak 2 V6 31F mutation testing to evaluate for myeloproliferative disorders.  We will check an LDH level. - We will also obtain serum quantitative immunoglobulins.

## 2018-09-13 NOTE — Progress Notes (Signed)
CONSULT NOTE  Patient Care Team: Rakes, Connye Burkitt, FNP as PCP - General (Family Medicine)  CHIEF COMPLAINTS/PURPOSE OF CONSULTATION: Leukocytosis   HISTORY OF PRESENTING ILLNESS:  Lauren Cardenas 33 y.o. female is here because of leukocytosis. She was sent by her PCP [facility]. She is here today alone. She was found to have abnormal CBC from [facility]. She reports she has had an elevated WBC for many years and she has never had problems. She reports the only new medication she started was her steroid inhaler for her wheezing. She reports she has never smoked. She denies any fevers, night sweats, chills, or unexplained weight loss. She has had a few infections of strep throat. She has had multiple kidney stones where she had to have them surgically removed. She has also had lithotripsy a few times. She reports she is fatigued and has no energy during the day it is usually around 50%. Her appetite is good at 100%. She denies any swollen lymph nodes. .She denies recent chest pain on exertion, shortness of breath on minimal exertion, pre-syncopal episodes, or palpitations. She has not noticed any recent bleeding such as epistaxis, hematuria or hematochezia. The patient denies over the counter NSAID ingestion. She had no prior history or diagnosis of cancer. Her age appropriate screening programs are up-to-date. She denies any pica and eats a variety of diet. She never donated blood or received blood transfusion.  She reports only her maternal grandmother having skin cancer. No other family members to her knowledge. 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 She is full functioning lives alone with her husband and able to perform all her own activities.   MEDICAL HISTORY:  Past Medical History:  Diagnosis Date  . Chronic migraine    neurologist-  dr Jaynee Eagles  . Constipation   . Depression 05/30/2017   with emotional eating  . Dysuria   . GERD (gastroesophageal  reflux disease)   . History of acute pyelonephritis    09-27-2015:   11-04-2017  w/ sepsis due to obstruction from kidney stone  . History of kidney stones   . IC (interstitial cystitis)   . Interstitial cystitis   . Left ureteral stone   . OAB (overactive bladder)   . Pre-diabetes   . Urge urinary incontinence   . Urgency of urination   . Urinary reflux   . Vesicoureteral reflux     SURGICAL HISTORY: Past Surgical History:  Procedure Laterality Date  . CHOLECYSTECTOMY N/A 10/26/2017   Procedure: LAPAROSCOPIC CHOLECYSTECTOMY WITH INTRAOPERATIVE CHOLANGIOGRAM ERAS PATHWAY;  Surgeon: Erroll Luna, MD;  Location: [facility];  Service: General;  Laterality: N/A;  . CYSTOSCOPY W/ URETERAL STENT PLACEMENT Right 09/27/2015   Procedure: CYSTOSCOPY WITH RETROGRADE PYELOGRAM/URETERAL STENT PLACEMENT;  Surgeon: Nickie Retort, MD;  Location: [facility];  Service: Urology;  Laterality: Right;  . CYSTOSCOPY WITH RETROGRADE PYELOGRAM, URETEROSCOPY AND STENT PLACEMENT Left 09/09/2014   Procedure: CYSTOSCOPY WITH LEFT RETROGRADE PYELOGRAM, URETEROSCOPY AND STONE EXTRACTION  DOUBLE J STENT PLACEMENT;  Surgeon: Festus Aloe, MD;  Location: [facility];  Service: Urology;  Laterality: Left;  . CYSTOSCOPY WITH STENT PLACEMENT Left 11/04/2017   Procedure: CYSTOSCOPY WITH RETROGRADE AND LEFT STENT PLACEMENT;  Surgeon: Lucas Mallow, MD;  Location: [facility];  Service: Urology;  Laterality: Left;  . CYSTOSCOPY/URETEROSCOPY/HOLMIUM LASER/STENT PLACEMENT Right 10/16/2015   Procedure: RIGHT URETEROSCOPY/HOLMIUM LASER/STENT PLACEMENT;  Surgeon: Festus Aloe, MD;  Location: [facility];  Service: Urology;  Laterality: Right;  . CYSTOSCOPY/URETEROSCOPY/HOLMIUM LASER/STENT PLACEMENT Left 11/21/2017   Procedure: CYSTOSCOPY/URETEROSCOPY/HOLMIUM LASER/STENT PLACEMENT;  Surgeon: Eskridge, Matthew, MD;  Location: [facility];  Service: Urology;  Laterality: Left;  ONLY NEEDS 60 MIN  . HOLMIUM  LASER APPLICATION Left 09/09/2014   Procedure: HOLMIUM LASER APPLICATION;  Surgeon: Matthew Eskridge, MD;  Location: [facility];  Service: Urology;  Laterality: Left;  . HOLMIUM LASER APPLICATION Right 10/16/2015   Procedure: HOLMIUM LASER APPLICATION;  Surgeon: Matthew Eskridge, MD;  Location: [facility];  Service: Urology;  Laterality: Right;  . STONE EXTRACTION WITH BASKET Right 10/16/2015   Procedure: STONE EXTRACTION WITH BASKET;  Surgeon: Matthew Eskridge, MD;  Location: [facility];  Service: Urology;  Laterality: Right;  . WISDOM TOOTH EXTRACTION  03/2014    SOCIAL HISTORY: Social History   Socioeconomic History  . Marital status: Married    Spouse name: Joseph  . Number of children: 1  . Years of education: 12  . Highest education level: Not on file  Occupational History  . Occupation: [employer]  Social Needs  . Financial resource strain: Not on file  . Food insecurity:    Worry: Not on file    Inability: Not on file  . Transportation needs:    Medical: Not on file    Non-medical: Not on file  Tobacco Use  . Smoking status: Never Smoker  . Smokeless tobacco: Never Used  Substance and Sexual Activity  . Alcohol use: No  . Drug use: No  . Sexual activity: Yes    Birth control/protection: None  Lifestyle  . Physical activity:    Days per week: Not on file    Minutes per session: Not on file  . Stress: Not on file  Relationships  . Social connections:    Talks on phone: Not on file    Gets together: Not on file    Attends religious service: Not on file    Active member of club or organization: Not on file    Attends meetings of clubs or organizations: Not on file    Relationship status: Not on file  . Intimate partner violence:    Fear of current or ex partner: Not on file    Emotionally abused: Not on file    Physically abused: Not on file    Forced sexual activity: Not on file  Other Topics Concern  . Not on file   Social History Narrative   Lives with husband and daughter and in-laws   Caffeine use: 100-150mg/week   She drinks about 3 cups of diet caffeine soda per week   She has been going [facility]    FAMILY HISTORY: Family History  Problem Relation Age of Onset  . Hypertension Mother   . Obesity Mother   . Diabetes Father   . Kidney disease Father   . Sudden death Father     ALLERGIES:  is allergic to sudafed [pseudoephedrine hcl] and amoxicillin.  MEDICATIONS:  Current Outpatient Medications  Medication Sig Dispense Refill  . albuterol (PROVENTIL HFA;VENTOLIN HFA) 108 (90 Base) MCG/ACT inhaler Inhale 2 puffs into the lungs every 6 (six) hours as needed for wheezing or shortness of breath. 1 Inhaler 2  . AVIANE 0.1-20 MG-MCG tablet Take 1 tablet by mouth at bedtime.   4  . buPROPion (WELLBUTRIN SR) 200 MG 12 hr tablet Take 1 tablet (200 mg total) by mouth   at bedtime. 90 tablet 3  . famotidine (PEPCID) 20 MG tablet Take 1 tablet (20 mg total) by mouth 2 (two) times daily for 30 days. 60 tablet 3  . fluticasone (FLOVENT HFA) 44 MCG/ACT inhaler Inhale 1 puff into the lungs 2 (two) times daily. 1 Inhaler 12  . ibuprofen (ADVIL,MOTRIN) 200 MG tablet Take 400 mg by mouth every 6 (six) hours as needed for mild pain.    . metFORMIN (GLUCOPHAGE) 500 MG tablet Take 1 tablet (500 mg total) by mouth daily with breakfast. 30 tablet 0  . Misc Natural Products (CRANBERRY/PROBIOTIC) TABS Take 1 tablet by mouth at bedtime.    . Multiple Vitamin (MULTI-VITAMIN DAILY PO) Take 1 tablet by mouth at bedtime.     . propranolol ER (INDERAL LA) 120 MG 24 hr capsule Take 1 capsule (120 mg total) by mouth at bedtime. No further refills will be provided until seen in office. 90 capsule 2  . rizatriptan (MAXALT-MLT) 10 MG disintegrating tablet Take 1 tablet (10 mg total) by mouth 3 (three) times daily as needed for migraine. 9 tablet 11  . sertraline (ZOLOFT) 50 MG tablet Take 1 tablet (50  mg total) by mouth at bedtime. 90 tablet 3   No current facility-administered medications for this visit.     REVIEW OF SYSTEMS:   Constitutional: Denies fevers, chills or abnormal night sweats Eyes: Denies blurriness of vision, double vision or watery eyes Ears, nose, mouth, throat, and face: Denies mucositis or sore throat Respiratory: Denies cough, dyspnea or wheezes Cardiovascular: Denies palpitation, chest discomfort or lower extremity swelling Gastrointestinal:  Denies nausea, heartburn or change in bowel habits Skin: Denies abnormal skin rashes Lymphatics: Denies new lymphadenopathy or easy bruising Neurological:Denies numbness, tingling or new weaknesses Behavioral/Psych: Mood is stable, no new changes  All other systems were reviewed with the patient and are negative.  PHYSICAL EXAMINATION: ECOG PERFORMANCE STATUS: 0 - Asymptomatic  Vitals:   09/13/18 1336  BP: 139/90  Pulse: 74  Temp: 98.4 F (36.9 C)  SpO2: 100%   Filed Weights   09/13/18 1336  Weight: 191 lb 9.6 oz (86.9 kg)    GENERAL:alert, no distress and comfortable SKIN: skin color, texture, turgor are normal, no rashes or significant lesions EYES: normal, conjunctiva are pink and non-injected, sclera clear OROPHARYNX:no exudate, no erythema and lips, buccal mucosa, and tongue normal  NECK: supple, thyroid normal size, non-tender, without nodularity LYMPH:  no palpable lymphadenopathy in the cervical, axillary or inguinal LUNGS: clear to auscultation and percussion with normal breathing effort HEART: regular rate & rhythm and no murmurs and no lower extremity edema ABDOMEN:abdomen soft, non-tender and normal bowel sounds Musculoskeletal:no cyanosis of digits and no clubbing  PSYCH: alert & oriented x 3 with fluent speech NEURO: no focal motor/sensory deficits  LABORATORY DATA:  I have reviewed the data as listed Recent Results (from the past 2160 hour(s))  Veritor Flu A/B Waived     Status: None    Collection Time: 07/31/18  9:27 AM  Result Value Ref Range   Influenza A Negative Negative   Influenza B Negative Negative    Comment: If the test is negative for the presence of influenza A or influenza B antigen, infection due to influenza cannot be ruled-out because the antigen present in the sample may be below the detection limit of the test. It is recommended that these results be confirmed by viral culture or an FDA-cleared influenza A and B molecular assay.   Rapid Strep Screen ([facility] ONLY)     Status: None   Collection Time: 07/31/18  9:27 AM  Result Value Ref Range   Strep Gp A Ag, IA W/Reflex Negative Negative  Culture, Group A Strep     Status: None   Collection Time: 07/31/18  9:27 AM  Result Value Ref Range   Strep A Culture CANCELED     Comment: Test not performed  Result canceled by the ancillary.   Rapid Strep Screen ([facility] ONLY)     Status: Abnormal   Collection Time: 08/03/18  9:21 AM  Result Value Ref Range   Strep Gp A Ag, IA W/Reflex Positive (A) Negative  Rapid Strep Screen ([facility] ONLY)     Status: Abnormal   Collection Time: 08/17/18  5:56 PM  Result Value Ref Range   Strep Gp A Ag, IA W/Reflex Positive (A) Negative  Lipid panel     Status: Abnormal   Collection Time: 08/31/18 10:59 AM  Result Value Ref Range   Cholesterol, Total 202 (H) 100 - 199 mg/dL   Triglycerides 235 (H) 0 - 149 mg/dL   HDL 62 >39 mg/dL   VLDL Cholesterol Cal 47 (H) 5 - 40 mg/dL   LDL Calculated 93 0 - 99 mg/dL   Chol/HDL Ratio 3.3 0.0 - 4.4 ratio    Comment:                                   T. Chol/HDL Ratio                                             Men  Women                               1/2 Avg.Risk  3.4    3.3                                   Avg.Risk  5.0    4.4                                2X Avg.Risk  9.6    7.1                                3X Avg.Risk 23.4   11.0   TSH     Status: None   Collection Time: 08/31/18 10:59 AM  Result  Value Ref Range   TSH 1.450 0.450 - 4.500 uIU/mL  CMP14+EGFR     Status: None   Collection Time: 08/31/18 10:59 AM  Result Value Ref Range   Glucose 72 65 - 99 mg/dL   BUN 14 6 - 20 mg/dL   Creatinine, Ser 0.84 0.57 - 1.00 mg/dL   GFR calc non Af Amer 91 >59 mL/min/1.73   GFR calc Af Amer 105 >59 mL/min/1.73   BUN/Creatinine Ratio 17 9 - 23   Sodium 142 134 - 144 mmol/L   Potassium 5.2 3.5 - 5.2 mmol/L   Chloride 104 96 - 106 mmol/L   CO2   21 20 - 29 mmol/L   Calcium 9.4 8.7 - 10.2 mg/dL   Total Protein 7.2 6.0 - 8.5 g/dL   Albumin 4.1 3.8 - 4.8 g/dL   Globulin, Total 3.1 1.5 - 4.5 g/dL   Albumin/Globulin Ratio 1.3 1.2 - 2.2   Bilirubin Total 0.3 0.0 - 1.2 mg/dL   Alkaline Phosphatase 54 39 - 117 IU/L   AST 23 0 - 40 IU/L   ALT 20 0 - 32 IU/L  CBC with Differential/Platelet     Status: Abnormal   Collection Time: 08/31/18 10:59 AM  Result Value Ref Range   WBC 14.9 (H) 3.4 - 10.8 x10E3/uL   RBC 4.92 3.77 - 5.28 x10E6/uL   Hemoglobin 13.0 11.1 - 15.9 g/dL   Hematocrit 40.1 34.0 - 46.6 %   MCV 82 79 - 97 fL   MCH 26.4 (L) 26.6 - 33.0 pg   MCHC 32.4 31.5 - 35.7 g/dL   RDW 13.9 11.7 - 15.4 %   Platelets 279 150 - 450 x10E3/uL   Neutrophils 66 Not Estab. %   Lymphs 20 Not Estab. %   Monocytes 5 Not Estab. %   Eos 7 Not Estab. %   Basos 1 Not Estab. %   Neutrophils Absolute 9.8 (H) 1.4 - 7.0 x10E3/uL   Lymphocytes Absolute 3.0 0.7 - 3.1 x10E3/uL   Monocytes Absolute 0.8 0.1 - 0.9 x10E3/uL   EOS (ABSOLUTE) 1.1 (H) 0.0 - 0.4 x10E3/uL   Basophils Absolute 0.1 0.0 - 0.2 x10E3/uL   Immature Granulocytes 1 Not Estab. %   Immature Grans (Abs) 0.1 0.0 - 0.1 x10E3/uL  Bayer DCA Hb A1c Waived     Status: None   Collection Time: 08/31/18 10:59 AM  Result Value Ref Range   HB A1C (BAYER DCA - WAIVED) 6.1 <7.0 %    Comment:                                       Diabetic Adult            <7.0                                       Healthy Adult        4.3 - 5.7                                                            (DCCT/NGSP) American Diabetes Association's Summary of Glycemic Recommendations for Adults with Diabetes: Hemoglobin A1c <7.0%. More stringent glycemic goals (A1c <6.0%) may further reduce complications at the cost of increased risk of hypoglycemia.   Rapid Strep Screen ([facility] ONLY)     Status: None   Collection Time: 09/08/18  9:10 AM  Result Value Ref Range   Strep Gp A Ag, IA W/Reflex Negative Negative  Culture, Group A Strep     Status: None   Collection Time: 09/08/18  9:10 AM  Result Value Ref Range   Strep A Culture CANCELED     Comment: Test not performed  Result canceled by the ancillary.   Culture, Group A Strep       Status: None   Collection Time: 09/08/18 10:00 AM  Result Value Ref Range   Strep A Culture Negative   Reticulocytes     Status: None   Collection Time: 09/13/18  3:06 PM  Result Value Ref Range   Retic Ct Pct 2.0 0.4 - 3.1 %   RBC. 4.97 3.87 - 5.11 MIL/uL   Retic Count, Absolute 97.4 19.0 - 186.0 K/uL   Immature Retic Fract 8.5 2.3 - 15.9 %    Comment: Performed at [facility], [address] [place], [place] [address]  Save Smear (SSMR)     Status: None   Collection Time: 09/13/18  3:06 PM  Result Value Ref Range   Smear Review SMEAR STAINED AND AVAILABLE FOR REVIEW     Comment: Performed at [facility], [address] [place], [place] [address]  Comprehensive metabolic panel     Status: None   Collection Time: 09/13/18  3:06 PM  Result Value Ref Range   Sodium 140 135 - 145 mmol/L   Potassium 3.8 3.5 - 5.1 mmol/L   Chloride 106 98 - 111 mmol/L   CO2 26 22 - 32 mmol/L   Glucose, Bld 85 70 - 99 mg/dL   BUN 15 6 - 20 mg/dL   Creatinine, Ser 0.78 0.44 - 1.00 mg/dL   Calcium 9.2 8.9 - 10.3 mg/dL   Total Protein 8.1 6.5 - 8.1 g/dL   Albumin 4.1 3.5 - 5.0 g/dL   AST 32 15 - 41 U/L   ALT 38 0 - 44 U/L   Alkaline Phosphatase 46 38 - 126 U/L   Total Bilirubin 0.4 0.3 - 1.2 mg/dL   GFR calc non Af Amer >60  >60 mL/min   GFR calc Af Amer >60 >60 mL/min   Anion gap 8 5 - 15    Comment: Performed at [facility], [address] [place], [place] [address]  CBC with Differential/Platelet     Status: Abnormal   Collection Time: 09/13/18  3:06 PM  Result Value Ref Range   WBC 13.3 (H) 4.0 - 10.5 K/uL   RBC 4.97 3.87 - 5.11 MIL/uL   Hemoglobin 13.0 12.0 - 15.0 g/dL   HCT 41.9 36.0 - 46.0 %   MCV 84.3 80.0 - 100.0 fL   MCH 26.2 26.0 - 34.0 pg   MCHC 31.0 30.0 - 36.0 g/dL   RDW 13.4 11.5 - 15.5 %   Platelets 316 150 - 400 K/uL   nRBC 0.0 0.0 - 0.2 %   Neutrophils Relative % 68 %   Lymphocytes Relative 23 %   Monocytes Relative 6 %   Eosinophils Relative 3 %   Basophils Relative 0 %   Band Neutrophils 0 %   Metamyelocytes Relative 0 %   Myelocytes 0 %   Promyelocytes Relative 0 %   Blasts 0 %   nRBC 0 0 /100 WBC   Other 0 %   Neutro Abs 9.0 (H) 1.7 - 7.7 K/uL   Lymphs Abs 3.1 0.7 - 4.0 K/uL   Monocytes Absolute 0.8 0.1 - 1.0 K/uL   Eosinophils Absolute 0.4 0.0 - 0.5 K/uL   Basophils Absolute 0.0 0.0 - 0.1 K/uL    Comment: Performed at  Hospital, [address] [place], [place] [address]  Lactate dehydrogenase     Status: None   Collection Time: 09/13/18  3:06 PM  Result Value Ref Range   LDH 131 98 - 192 U/L    Comment: Performed at [facility], [address] [place], [place] [address]    RADIOGRAPHIC STUDIES: I have personally reviewed the radiological images as listed and agreed with the findings in the report.  ASSESSMENT & PLAN:  Leukocytosis 1.  Leukocytosis: - Patient has leukocytosis, predominantly neutrophilic and occasional monocytosis since 2013. -Denies any recurrent infections.  Patient is never smoker. -Denies any chronic steroid use. - CT abdomen and pelvis with contrast on 10/07/2017 shows cholelithiasis, normal spleen and liver. -CT chest PE protocol on 10/31/2017 was negative for PE and other abnormalities. -Physical examination does not reveal any  abnormalities.  Denies any fevers, night sweats or weight loss. - We had a prolonged discussion about etiology of leukocytosis.  We would like to repeat a CBC today and review her smear.  We will check for BCR/ABL by FISH to rule out CML.  We will also send a Jak 2 V6 17F mutation testing to evaluate for myeloproliferative disorders.  We will check an LDH level. - We will also obtain serum quantitative immunoglobulins.     All questions were answered. The patient knows to call the clinic with any problems, questions or concerns.     Sreedhar Katragadda, MD 09/13/18 4:42 PM   

## 2018-09-14 LAB — IGG, IGA, IGM
IgA: 114 mg/dL (ref 87–352)
IgG (Immunoglobin G), Serum: 1239 mg/dL (ref 700–1600)
IgM (Immunoglobulin M), Srm: 114 mg/dL (ref 26–217)

## 2018-09-14 LAB — VITAMIN D 25 HYDROXY (VIT D DEFICIENCY, FRACTURES): Vit D, 25-Hydroxy: 28.2 ng/mL — ABNORMAL LOW (ref 30.0–100.0)

## 2018-09-18 LAB — JAK2 GENOTYPR

## 2018-09-19 LAB — BCR-ABL1 FISH
Cells Analyzed: 200
Cells Counted: 200

## 2018-10-03 ENCOUNTER — Other Ambulatory Visit: Payer: Self-pay

## 2018-10-03 ENCOUNTER — Encounter (HOSPITAL_COMMUNITY): Payer: Self-pay | Admitting: Hematology

## 2018-10-03 ENCOUNTER — Inpatient Hospital Stay (HOSPITAL_COMMUNITY): Payer: Medicaid Other | Attending: Hematology | Admitting: Hematology

## 2018-10-03 VITALS — BP 128/63 | HR 71 | Temp 99.4°F | Resp 18 | Wt 190.8 lb

## 2018-10-03 DIAGNOSIS — E559 Vitamin D deficiency, unspecified: Secondary | ICD-10-CM | POA: Diagnosis not present

## 2018-10-03 DIAGNOSIS — D72829 Elevated white blood cell count, unspecified: Secondary | ICD-10-CM | POA: Diagnosis not present

## 2018-10-03 NOTE — Patient Instructions (Addendum)
[facility] at [facility] Discharge Instructions  You were seen today by Dr. Ellin Saba.  He reviewed your recent test results.  He said that you have a myeloproliferative disorder that can be monitored.  Your vitamin D level is low, he recommends you taking Vitamin D daily.  He will see you back in 3 months with labs. Be sure to call our office if you have any symptoms that he reviewed and we can get you in sooner.       Thank you for choosing [facility] at [facility] to provide your oncology and hematology care.  To afford each patient quality time with our provider, please arrive at least 15 minutes before your scheduled appointment time.   If you have a lab appointment with the Cancer Center please come in thru the  Main Entrance and check in at the main information desk  You need to re-schedule your appointment should you arrive 10 or more minutes late.  We strive to give you quality time with our providers, and arriving late affects you and other patients whose appointments are after yours.  Also, if you no show three or more times for appointments you may be dismissed from the clinic at the providers discretion.     Again, thank you for choosing [facility].  Our hope is that these requests will decrease the amount of time that you wait before being seen by our physicians.       _____________________________________________________________  Should you have questions after your visit to [facility], please contact our office at [phone] between the hours of 8:00 a.m. and 4:30 p.m.  Voicemails left after 4:00 p.m. will not be returned until the following business day.  For prescription refill requests, have your pharmacy contact our office and allow 72 hours.    Cancer Center Support Programs:   > Cancer Support Group  2nd Tuesday of the month 1pm-2pm, Journey Room

## 2018-10-03 NOTE — Progress Notes (Signed)
[place], [place] [address]   CLINIC:  Medical Oncology/Hematology  PCP:  Baruch Gouty, [place] [address] [phone]   REASON FOR VISIT:  Follow-up for leukocytosis  CURRENT THERAPY: none    INTERVAL HISTORY:  Lauren Cardenas 33 y.o. female returns for follow-up for leukocytosis.  She reports overall doing well.  She states that she has had some decrease in energy levels.  She denies any recent fevers. She denies night sweats.  She denies any nausea or vomiting.  She reports having some diarrhea but is controlled.   Overall, she tells me she has been feeling pretty well. Energy levels 75%; appetite 50%.        REVIEW OF SYSTEMS:  Review of Systems  Gastrointestinal: Positive for diarrhea.  All other systems reviewed and are negative.    PAST MEDICAL/SURGICAL HISTORY:  Past Medical History:  Diagnosis Date  . Chronic migraine    neurologist-  dr Jaynee Eagles  . Constipation   . Depression 05/30/2017   with emotional eating  . Dysuria   . GERD (gastroesophageal reflux disease)   . History of acute pyelonephritis    09-27-2015:   11-04-2017  w/ sepsis due to obstruction from kidney stone  . History of kidney stones   . IC (interstitial cystitis)   . Interstitial cystitis   . Left ureteral stone   . OAB (overactive bladder)   . Pre-diabetes   . Urge urinary incontinence   . Urgency of urination   . Urinary reflux   . Vesicoureteral reflux    Past Surgical History:  Procedure Laterality Date  . CHOLECYSTECTOMY N/A 10/26/2017   Procedure: LAPAROSCOPIC CHOLECYSTECTOMY WITH INTRAOPERATIVE CHOLANGIOGRAM ERAS PATHWAY;  Surgeon: Erroll Luna, MD;  Location: [facility];  Service: General;  Laterality: N/A;  . CYSTOSCOPY W/ URETERAL STENT PLACEMENT Right 09/27/2015   Procedure: CYSTOSCOPY WITH RETROGRADE PYELOGRAM/URETERAL STENT PLACEMENT;  Surgeon: Nickie Retort, MD;  Location: [facility];  Service: Urology;   Laterality: Right;  . CYSTOSCOPY WITH RETROGRADE PYELOGRAM, URETEROSCOPY AND STENT PLACEMENT Left 09/09/2014   Procedure: CYSTOSCOPY WITH LEFT RETROGRADE PYELOGRAM, URETEROSCOPY AND STONE EXTRACTION  DOUBLE J STENT PLACEMENT;  Surgeon: Festus Aloe, MD;  Location: [facility];  Service: Urology;  Laterality: Left;  . CYSTOSCOPY WITH STENT PLACEMENT Left 11/04/2017   Procedure: CYSTOSCOPY WITH RETROGRADE AND LEFT STENT PLACEMENT;  Surgeon: Lucas Mallow, MD;  Location: [facility];  Service: Urology;  Laterality: Left;  . CYSTOSCOPY/URETEROSCOPY/HOLMIUM LASER/STENT PLACEMENT Right 10/16/2015   Procedure: RIGHT URETEROSCOPY/HOLMIUM LASER/STENT PLACEMENT;  Surgeon: Festus Aloe, MD;  Location: [facility];  Service: Urology;  Laterality: Right;  . CYSTOSCOPY/URETEROSCOPY/HOLMIUM LASER/STENT PLACEMENT Left 11/21/2017   Procedure: CYSTOSCOPY/URETEROSCOPY/HOLMIUM LASER/STENT PLACEMENT;  Surgeon: Festus Aloe, MD;  Location: [facility];  Service: Urology;  Laterality: Left;  ONLY NEEDS 60 MIN  . HOLMIUM LASER APPLICATION Left 9/83/3825   Procedure: HOLMIUM LASER APPLICATION;  Surgeon: Festus Aloe, MD;  Location: [facility];  Service: Urology;  Laterality: Left;  . HOLMIUM LASER APPLICATION Right 0/53/9767   Procedure: HOLMIUM LASER APPLICATION;  Surgeon: Festus Aloe, MD;  Location: [facility];  Service: Urology;  Laterality: Right;  . STONE EXTRACTION WITH BASKET Right 10/16/2015   Procedure: STONE EXTRACTION WITH BASKET;  Surgeon: Festus Aloe, MD;  Location: [facility];  Service: Urology;  Laterality: Right;  . WISDOM TOOTH EXTRACTION  03/2014     SOCIAL HISTORY:  Social  History   Socioeconomic History  . Marital status: Married    Spouse name: Lauren Cardenas  . Number of children: 1  . Years of education: 66  . Highest education level: Not on file  Occupational History  . 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 Financial resource strain: Not on file  . Food insecurity:    Worry: Not on file    Inability: Not on file  . Transportation needs:    Medical: Not on file    Non-medical: Not on file  Tobacco Use  . Smoking status: Never Smoker  . Smokeless tobacco: Never Used  Substance and Sexual Activity  . Alcohol use: No  . Drug use: No  . Sexual activity: Yes    Birth control/protection: None  Lifestyle  . Physical activity:    Days per week: Not on file    Minutes per session: Not on file  . Stress: Not on file  Relationships  . Social connections:    Talks on phone: Not on file    Gets together: Not on file    Attends religious service: Not on file    Active member of club or organization: Not on file    Attends meetings of clubs or organizations: Not on file    Relationship status: Not on file  . Intimate partner violence:    Fear of current or ex partner: Not on file    Emotionally abused: Not on file    Physically abused: Not on file    Forced sexual activity: Not on file  Other Topics Concern  . Not on file  Social History Narrative   Lives with husband and daughter and in-laws   Caffeine use: 100-147m/week   She drinks about 3 cups of diet caffeine soda per week   [place] [place]:  Family History  Problem Relation Age of Onset  . Hypertension Mother   . Obesity Mother   . Diabetes Father   . Kidney disease Father   . Sudden death Father     CURRENT MEDICATIONS:  Outpatient Encounter Medications as of 10/03/2018  Medication Sig  . albuterol (PROVENTIL HFA;VENTOLIN HFA) 108 (90 Base) MCG/ACT inhaler Inhale 2 puffs into the lungs every 6 (six) hours as needed for wheezing or shortness of breath.  .Vassie Moselle0.1-20 MG-MCG tablet Take 1 tablet by mouth at bedtime.   .Marland KitchenbuPROPion (WELLBUTRIN SR) 200 MG 12 hr tablet Take 1 tablet (200 mg total) by mouth at bedtime.  . cetirizine (ZYRTEC) 10 MG chewable tablet Chew  10 mg by mouth daily. Per pt  . fluticasone (FLOVENT HFA) 44 MCG/ACT inhaler Inhale 1 puff into the lungs 2 (two) times daily.  .Marland Kitchenibuprofen (ADVIL,MOTRIN) 200 MG tablet Take 400 mg by mouth every 6 (six) hours as needed for mild pain.  . metFORMIN (GLUCOPHAGE) 500 MG tablet Take 1 tablet (500 mg total) by mouth daily with breakfast.  . Misc Natural Products (CRANBERRY/PROBIOTIC) TABS Take 1 tablet by mouth at bedtime.  . Multiple Vitamin (MULTI-VITAMIN DAILY PO) Take 1 tablet by mouth at bedtime.   . OMEGA-3 FATTY ACIDS PO Take by mouth daily. Per pt she was unsure of the dosage.  . propranolol ER (INDERAL LA) 120 MG 24 hr capsule Take 1 capsule (120 mg total) by mouth at bedtime. No further refills will be provided until seen in office.  . Red Yeast Rice 600  MG CAPS Take 2,400 mg by mouth 2 times daily at 12 noon and 4 pm. Per pt takes 1200 mg BID  . rizatriptan (MAXALT-MLT) 10 MG disintegrating tablet Take 1 tablet (10 mg total) by mouth 3 (three) times daily as needed for migraine.  . sertraline (ZOLOFT) 50 MG tablet Take 1 tablet (50 mg total) by mouth at bedtime.  . famotidine (PEPCID) 20 MG tablet Take 1 tablet (20 mg total) by mouth 2 (two) times daily for 30 days.   No facility-administered encounter medications on file as of 10/03/2018.     ALLERGIES:  Allergies  Allergen Reactions  . Sudafed [Pseudoephedrine Hcl] Shortness Of Breath  . Amoxicillin Hives    ++ got ceftriaxone in the past++ Has patient had a PCN reaction causing immediate rash, facial/tongue/throat swelling, SOB or lightheadedness with hypotension: Yes Has patient had a PCN reaction causing severe rash involving mucus membranes or skin necrosis: No Has patient had a PCN reaction that required hospitalization: No Has patient had a PCN reaction occurring within the last 10 years: Yes If all of the above answers are "NO", then may proceed with Cephalosporin use.      PHYSICAL EXAM:  ECOG Performance status: 0   Vitals:   10/03/18 0831  BP: 128/63  Pulse: 71  Resp: 18  Temp: 99.4 F (37.4 C)  SpO2: 98%   Filed Weights   10/03/18 0831  Weight: 190 lb 12.8 oz (86.5 kg)    Physical Exam Vitals signs reviewed.  Constitutional:      Appearance: Normal appearance.  Cardiovascular:     Rate and Rhythm: Normal rate and regular rhythm.     Heart sounds: Normal heart sounds.  Pulmonary:     Effort: Pulmonary effort is normal.     Breath sounds: Normal breath sounds.  Abdominal:     General: There is no distension.     Palpations: Abdomen is soft. There is no mass.     Tenderness: There is no abdominal tenderness.  Musculoskeletal:        General: No swelling.  Skin:    General: Skin is warm.  Neurological:     General: No focal deficit present.     Mental Status: She is alert and oriented to person, place, and time.  Psychiatric:        Mood and Affect: Mood normal.        Behavior: Behavior normal.      LABORATORY DATA:  I have reviewed the labs as listed.  CBC    Component Value Date/Time   WBC 13.3 (H) 09/13/2018 1506   RBC 4.97 09/13/2018 1506   RBC 4.97 09/13/2018 1506   HGB 13.0 09/13/2018 1506   HGB 13.0 08/31/2018 1059   HCT 41.9 09/13/2018 1506   HCT 40.1 08/31/2018 1059   PLT 316 09/13/2018 1506   PLT 279 08/31/2018 1059   MCV 84.3 09/13/2018 1506   MCV 82 08/31/2018 1059   MCH 26.2 09/13/2018 1506   MCHC 31.0 09/13/2018 1506   RDW 13.4 09/13/2018 1506   RDW 13.9 08/31/2018 1059   LYMPHSABS 3.1 09/13/2018 1506   LYMPHSABS 3.0 08/31/2018 1059   MONOABS 0.8 09/13/2018 1506   EOSABS 0.4 09/13/2018 1506   EOSABS 1.1 (H) 08/31/2018 1059   BASOSABS 0.0 09/13/2018 1506   BASOSABS 0.1 08/31/2018 1059   CMP Latest Ref Rng & Units 09/13/2018 08/31/2018 04/06/2018  Glucose 70 - 99 mg/dL 85 72 80  BUN 6 - 20 mg/dL 15  14 17  Creatinine 0.44 - 1.00 mg/dL 0.78 0.84 0.70  Sodium 135 - 145 mmol/L 140 142 141  Potassium 3.5 - 5.1 mmol/L 3.8 5.2 3.9  Chloride 98 - 111  mmol/L 106 104 111  CO2 22 - 32 mmol/L 26 21 -  Calcium 8.9 - 10.3 mg/dL 9.2 9.4 -  Total Protein 6.5 - 8.1 g/dL 8.1 7.2 -  Total Bilirubin 0.3 - 1.2 mg/dL 0.4 0.3 -  Alkaline Phos 38 - 126 U/L 46 54 -  AST 15 - 41 U/L 32 23 -  ALT 0 - 44 U/L 38 20 -       DIAGNOSTIC IMAGING:  I have independently reviewed the scans and discussed with the patient.   I have reviewed Jene Every  note and agree with the documentation.  I personally performed a face-to-face visit, made revisions and my assessment and plan is as follows.    ASSESSMENT & PLAN:   Leukocytosis 1.  Leukocytosis: - Leukocytosis, predominantly neutrophilic and occasional monocytosis since 2013. -She was a never smoker.  Denies any chronic steroid use.  Denies any recurrent infections. -CT of the abdomen and pelvis with contrast on 10/07/2017 shows normal spleen and liver.  -CT chest PE protocol on 10/31/2017 was negative for PE and other abnormalities. -Denies any fevers, night sweats or weight loss.  Physical examination did not reveal any lymphadenopathy or splenomegaly. -We reviewed the results of the blood work.  BCR/ABL by FISH was negative ruling out CML.  Jak 2 V6 65F testing was also negative.  LDH was normal. - Serum quantitative immunoglobulins were normal. - Repeat CBC showed white count of 13.3 with ANC of 9000. - I have recommended a bone marrow biopsy to complete work-up.  However we can wait as there is no immediate indication at this time.  We will see her back in 3 to 4 months and repeat her CBC.  She is agreeable to this plan.  She was told to come back sooner if she develops any above symptoms.  2.  Vitamin D deficiency: -Vitamin D was 28.2.  She was told to take 1000 international units of vitamin D daily.  Will check it at next visit.      Orders placed this encounter:  Orders Placed This Encounter  Procedures  . CBC with Differential/Platelet  . Comprehensive metabolic panel  . Lactate  dehydrogenase  . Vitamin D 25 hydroxy      Derek Jack, MD [place] [phone]

## 2018-10-03 NOTE — Assessment & Plan Note (Signed)
1.  Leukocytosis: - Leukocytosis, predominantly neutrophilic and occasional monocytosis since 2013. -She was a never smoker.  Denies any chronic steroid use.  Denies any recurrent infections. -CT of the abdomen and pelvis with contrast on 10/07/2017 shows normal spleen and liver.  -CT chest PE protocol on 10/31/2017 was negative for PE and other abnormalities. -Denies any fevers, night sweats or weight loss.  Physical examination did not reveal any lymphadenopathy or splenomegaly. -We reviewed the results of the blood work.  BCR/ABL by FISH was negative ruling out CML.  Jak 2 V6 17F testing was also negative.  LDH was normal. - Serum quantitative immunoglobulins were normal. - Repeat CBC showed white count of 13.3 with ANC of 9000. - I have recommended a bone marrow biopsy to complete work-up.  However we can wait as there is no immediate indication at this time.  We will see her back in 3 to 4 months and repeat her CBC.  She is agreeable to this plan.  She was told to come back sooner if she develops any above symptoms.  2.  Vitamin D deficiency: -Vitamin D was 28.2.  She was told to take 1000 international units of vitamin D daily.  Will check it at next visit. 

## 2018-10-15 ENCOUNTER — Other Ambulatory Visit: Payer: Self-pay | Admitting: Family Medicine

## 2018-10-15 DIAGNOSIS — R7303 Prediabetes: Secondary | ICD-10-CM

## 2018-10-30 ENCOUNTER — Other Ambulatory Visit: Payer: Self-pay

## 2018-10-30 ENCOUNTER — Ambulatory Visit (INDEPENDENT_AMBULATORY_CARE_PROVIDER_SITE_OTHER): Payer: Medicaid Other | Admitting: Physician Assistant

## 2018-10-30 ENCOUNTER — Encounter: Payer: Self-pay | Admitting: Physician Assistant

## 2018-10-30 DIAGNOSIS — N3000 Acute cystitis without hematuria: Secondary | ICD-10-CM | POA: Diagnosis not present

## 2018-10-30 MED ORDER — CIPROFLOXACIN HCL 500 MG PO TABS: 500.0000 mg | ORAL_TABLET | Freq: Two times a day (BID) | ORAL | 0 refills | Status: DC

## 2018-10-30 NOTE — Progress Notes (Signed)
Telephone visit  Subjective: CC:UTI PCP: Sonny Masters, FNP HQP:[id number] D Shaub is a 33 y.o. female calls for telephone consult today. Patient provides verbal consent for consult held via phone.  Patient is identified with 2 separate identifiers.  At this time the entire area is on COVID-19 social distancing and stay home orders are in place.  Patient is of higher risk and therefore we are performing this by a virtual method.  Location of patient: home Location of provider: [facility] Others present for call: no  Patient has known interstitial cystitis, therefore she does have daily pain in the bladder.  However over the past 3 days she has had burning in the urethra, frequency and urine that has darkened in color.  She states this morning it had a green hint to it.  She denies any fever or chills.  She has not felt very sick overall in reviewing her record she has had pyelonephritis in the past.  States she does like to get on top of that when she starts to feel any symptoms that could be infection.  She does have an appointment later this month with her urologist.   ROS: Per HPI  Allergies  Allergen Reactions  . Sudafed [Pseudoephedrine Hcl] Shortness Of Breath  . Amoxicillin Hives    ++ got ceftriaxone in the past++ Has patient had a PCN reaction causing immediate rash, facial/tongue/throat swelling, SOB or lightheadedness with hypotension: Yes Has patient had a PCN reaction causing severe rash involving mucus membranes or skin necrosis: No Has patient had a PCN reaction that required hospitalization: No Has patient had a PCN reaction occurring within the last 10 years: Yes If all of the above answers are "NO", then may proceed with Cephalosporin use.    Past Medical History:  Diagnosis Date  . Chronic migraine    neurologist-  dr Lucia Gaskins  . Constipation   . Depression 05/30/2017   with emotional eating  . Dysuria   . GERD (gastroesophageal reflux disease)   . History of  acute pyelonephritis    09-27-2015:   11-04-2017  w/ sepsis due to obstruction from kidney stone  . History of kidney stones   . IC (interstitial cystitis)   . Interstitial cystitis   . Left ureteral stone   . OAB (overactive bladder)   . Pre-diabetes   . Urge urinary incontinence   . Urgency of urination   . Urinary reflux   . Vesicoureteral reflux     Current Outpatient Medications:  .  albuterol (PROVENTIL HFA;VENTOLIN HFA) 108 (90 Base) MCG/ACT inhaler, Inhale 2 puffs into the lungs every 6 (six) hours as needed for wheezing or shortness of breath., Disp: 1 Inhaler, Rfl: 2 .  AVIANE 0.1-20 MG-MCG tablet, Take 1 tablet by mouth at bedtime. , Disp: , Rfl: 4 .  buPROPion (WELLBUTRIN SR) 200 MG 12 hr tablet, Take 1 tablet (200 mg total) by mouth at bedtime., Disp: 90 tablet, Rfl: 3 .  cetirizine (ZYRTEC) 10 MG chewable tablet, Chew 10 mg by mouth daily. Per pt, Disp: , Rfl:  .  ciprofloxacin (CIPRO) 500 MG tablet, Take 1 tablet (500 mg total) by mouth 2 (two) times daily., Disp: 20 tablet, Rfl: 0 .  famotidine (PEPCID) 20 MG tablet, Take 1 tablet (20 mg total) by mouth 2 (two) times daily for 30 days., Disp: 60 tablet, Rfl: 3 .  fluticasone (FLOVENT HFA) 44 MCG/ACT inhaler, Inhale 1 puff into the lungs 2 (two) times daily., Disp: 1  Inhaler, Rfl: 12 .  ibuprofen (ADVIL,MOTRIN) 200 MG tablet, Take 400 mg by mouth every 6 (six) hours as needed for mild pain., Disp: , Rfl:  .  metFORMIN (GLUCOPHAGE) 500 MG tablet, TAKE 1 TABLET DAILY, Disp: 30 tablet, Rfl: 1 .  Misc Natural Products (CRANBERRY/PROBIOTIC) TABS, Take 1 tablet by mouth at bedtime., Disp: , Rfl:  .  Multiple Vitamin (MULTI-VITAMIN DAILY PO), Take 1 tablet by mouth at bedtime. , Disp: , Rfl:  .  OMEGA-3 FATTY ACIDS PO, Take by mouth daily. Per pt she was unsure of the dosage., Disp: , Rfl:  .  propranolol ER (INDERAL LA) 120 MG 24 hr capsule, Take 1 capsule (120 mg total) by mouth at bedtime. No further refills will be provided  until seen in office., Disp: 90 capsule, Rfl: 2 .  Red Yeast Rice 600 MG CAPS, Take 2,400 mg by mouth 2 times daily at 12 noon and 4 pm. Per pt takes 1200 mg BID, Disp: , Rfl:  .  rizatriptan (MAXALT-MLT) 10 MG disintegrating tablet, Take 1 tablet (10 mg total) by mouth 3 (three) times daily as needed for migraine., Disp: 9 tablet, Rfl: 11 .  sertraline (ZOLOFT) 50 MG tablet, Take 1 tablet (50 mg total) by mouth at bedtime., Disp: 90 tablet, Rfl: 3  Assessment/ Plan: 33 y.o. female   1. Acute cystitis without hematuria - ciprofloxacin (CIPRO) 500 MG tablet; Take 1 tablet (500 mg total) by mouth 2 (two) times daily.  Dispense: 20 tablet; Refill: 0   Start time: 11:40 AM End time: 11: 45 AM  Meds ordered this encounter  Medications  . ciprofloxacin (CIPRO) 500 MG tablet    Sig: Take 1 tablet (500 mg total) by mouth 2 (two) times daily.    Dispense:  20 tablet    Refill:  0    Order Specific Question:   Supervising Provider    Answer:   Raliegh IpGOTTSCHALK, ASHLY M [[id number]]    Prudy FeelerAngel Arthella Headings PA-C [facility] [phone]

## 2018-11-14 DIAGNOSIS — N200 Calculus of kidney: Secondary | ICD-10-CM | POA: Diagnosis not present

## 2018-11-14 DIAGNOSIS — R300 Dysuria: Secondary | ICD-10-CM | POA: Diagnosis not present

## 2018-11-14 DIAGNOSIS — R350 Frequency of micturition: Secondary | ICD-10-CM | POA: Diagnosis not present

## 2018-11-14 DIAGNOSIS — R102 Pelvic and perineal pain: Secondary | ICD-10-CM | POA: Diagnosis not present

## 2018-11-22 ENCOUNTER — Other Ambulatory Visit: Payer: Self-pay

## 2018-11-22 ENCOUNTER — Ambulatory Visit: Payer: Medicaid Other | Attending: Urology | Admitting: Physical Therapy

## 2018-11-22 ENCOUNTER — Encounter: Payer: Self-pay | Admitting: Physical Therapy

## 2018-11-22 DIAGNOSIS — M6281 Muscle weakness (generalized): Secondary | ICD-10-CM | POA: Diagnosis not present

## 2018-11-22 DIAGNOSIS — R278 Other lack of coordination: Secondary | ICD-10-CM | POA: Insufficient documentation

## 2018-11-22 DIAGNOSIS — N3010 Interstitial cystitis (chronic) without hematuria: Secondary | ICD-10-CM | POA: Diagnosis not present

## 2018-11-22 DIAGNOSIS — M62838 Other muscle spasm: Secondary | ICD-10-CM | POA: Diagnosis not present

## 2018-11-22 NOTE — Therapy (Signed)
[facility] [address] [place], [place], [address] Phone: [phone]   Fax:  [phone]  Physical Therapy Evaluation  Patient Details  Name: Lauren Cardenas MRN: [id number] Date of Birth: 1985/08/24 Referring Provider (PT): Vianne Bulls, [facility]   Encounter Date: 11/22/2018  PT End of Session - 11/22/18 0846    Visit Number  1    Date for PT Re-Evaluation  02/14/19    Authorization Type  Medicaid    PT Start Time  0800    PT Stop Time  0841    PT Time Calculation (min)  41 min    Activity Tolerance  Patient tolerated treatment well;No increased pain    Behavior During Therapy  WFL for tasks assessed/performed       Past Medical History:  Diagnosis Date  . Chronic migraine    neurologist-  dr Lucia Gaskins  . Constipation   . Depression 05/30/2017   with emotional eating  . Dysuria   . GERD (gastroesophageal reflux disease)   . History of acute pyelonephritis    09-27-2015:   11-04-2017  w/ sepsis due to obstruction from kidney stone  . History of kidney stones   . IC (interstitial cystitis)   . Interstitial cystitis   . Left ureteral stone   . OAB (overactive bladder)   . Pre-diabetes   . Urge urinary incontinence   . Urgency of urination   . Urinary reflux   . Vesicoureteral reflux     Past Surgical History:  Procedure Laterality Date  . CHOLECYSTECTOMY N/A 10/26/2017   Procedure: LAPAROSCOPIC CHOLECYSTECTOMY WITH INTRAOPERATIVE CHOLANGIOGRAM ERAS PATHWAY;  Surgeon: Harriette Bouillon, MD;  Location: [facility];  Service: General;  Laterality: N/A;  . CYSTOSCOPY W/ URETERAL STENT PLACEMENT Right 09/27/2015   Procedure: CYSTOSCOPY WITH RETROGRADE PYELOGRAM/URETERAL STENT PLACEMENT;  Surgeon: Hildred Laser, MD;  Location: [facility];  Service: Urology;  Laterality: Right;  . CYSTOSCOPY WITH RETROGRADE PYELOGRAM, URETEROSCOPY AND STENT PLACEMENT Left 09/09/2014   Procedure: CYSTOSCOPY WITH LEFT RETROGRADE PYELOGRAM,  URETEROSCOPY AND STONE EXTRACTION  DOUBLE J STENT PLACEMENT;  Surgeon: Jerilee Field, MD;  Location: [facility];  Service: Urology;  Laterality: Left;  . CYSTOSCOPY WITH STENT PLACEMENT Left 11/04/2017   Procedure: CYSTOSCOPY WITH RETROGRADE AND LEFT STENT PLACEMENT;  Surgeon: Crista Elliot, MD;  Location: [facility];  Service: Urology;  Laterality: Left;  . CYSTOSCOPY/URETEROSCOPY/HOLMIUM LASER/STENT PLACEMENT Right 10/16/2015   Procedure: RIGHT URETEROSCOPY/HOLMIUM LASER/STENT PLACEMENT;  Surgeon: Jerilee Field, MD;  Location: [facility];  Service: Urology;  Laterality: Right;  . CYSTOSCOPY/URETEROSCOPY/HOLMIUM LASER/STENT PLACEMENT Left 11/21/2017   Procedure: CYSTOSCOPY/URETEROSCOPY/HOLMIUM LASER/STENT PLACEMENT;  Surgeon: Jerilee Field, MD;  Location: [facility];  Service: Urology;  Laterality: Left;  ONLY NEEDS 60 MIN  . HOLMIUM LASER APPLICATION Left 09/09/2014   Procedure: HOLMIUM LASER APPLICATION;  Surgeon: Jerilee Field, MD;  Location: [facility];  Service: Urology;  Laterality: Left;  . HOLMIUM LASER APPLICATION Right 10/16/2015   Procedure: HOLMIUM LASER APPLICATION;  Surgeon: Jerilee Field, MD;  Location: [facility];  Service: Urology;  Laterality: Right;  . STONE EXTRACTION WITH BASKET Right 10/16/2015   Procedure: STONE EXTRACTION WITH BASKET;  Surgeon: Jerilee Field, MD;  Location: [facility];  Service: Urology;  Laterality: Right;  . WISDOM TOOTH EXTRACTION  03/2014    There were no vitals filed for this visit.   Subjective Assessment - 11/22/18 0803    Subjective  Patient does not fully  empty her bladder and will have to go after urinating 15 minutes later. Patient reports pelvic pain for long time and has IC, overactive bladder, history of kidney. Patient reports a recent flare up that started 1 month ago. Pain is intermittent and 3 days out of the week.  Urinary leakage with sneezing, coughing. No  constipation.     Patient Stated Goals  less pain, be able to fully empty her bladder    Currently in Pain?  Yes    Pain Score  3     Pain Location  Abdomen    Pain Orientation  Mid;Lower    Pain Descriptors / Indicators  Burning;Sharp    Pain Type  Chronic pain    Pain Onset  More than a month ago    Pain Frequency  Intermittent    Aggravating Factors   sitting, urinating, intercourse with initial penetration    Pain Relieving Factors  uses a donut pillow to sit    Multiple Pain Sites  No         [facility] PT Assessment - 11/22/18 0001      Assessment   Medical Diagnosis  R10.2 Pelvic pain; R30.0 Dysuria    Referring Provider (PT)  [facility]Bree Fleck, [facility]    Onset Date/Surgical Date  04/27/18    Prior Therapy  none      Precautions   Precautions  None      Restrictions   Weight Bearing Restrictions  No      Balance Screen   Has the patient fallen in the past 6 months  Yes    How many times?  1   slipped due to wearing socks on hardwooks and fell on pillow   Has the patient had a decrease in activity level because of a fear of falling?   No   does not feel it is balance issue   Is the patient reluctant to leave their home because of a fear of falling?   No      Home Public house managernvironment   Living Environment  Private residence      Prior Function   Level of Independence  Independent    Vocation  Full time employment    Vocation Requirements  desk job with sitting    Leisure  no exercise      Cognition   Overall Cognitive Status  Within Functional Limits for tasks assessed      Posture/Postural Control   Posture/Postural Control  Postural limitations    Postural Limitations  Rounded Shoulders;Forward head;Decreased lumbar lordosis      ROM / Strength   AROM / PROM / Strength  AROM;PROM;Strength      AROM   Lumbar Extension  decreased by 25%    Lumbar - Right Side Bend  decreased by 25%    Lumbar - Left Side Bend  decreased by 25%      PROM   Overall PROM Comments  passive  left hip flexion with pain in left lower abdominal    Right Hip External Rotation   40    Left Hip External Rotation   40      Strength   Overall Strength Comments  abdominal strength 2/5    Right Hip Extension  3+/5    Right Hip External Rotation   4/5    Left Hip Extension  3+/5      Palpation   Spinal mobility  L4-5, T8-T12 decreased P-A mobility    Palpation comment  tenderness  located left suprapubically, tightness in the diaphragm, right transverse abdonminus                Objective measurements completed on examination: See above findings.    Pelvic Floor Special Questions - 11/22/18 0001    Prior Pregnancies  Yes    Number of Pregnancies  1    Number of Vaginal Deliveries  1    Any difficulty with labor and deliveries  Yes   vacuum assisted   Episiotomy Performed  No    Diastasis Recti  none    Currently Sexually Active  Yes    Is this Painful  Yes    Marinoff Scale  discomfort that does not affect completion   initial penetration   Urinary Leakage  Yes    Pad use  1    Activities that cause leaking  Coughing;Sneezing    Urinary urgency  Yes    Urinary frequency  2-3 hours    Fecal incontinence  No    Falling out feeling (prolapse)  Yes    Activities that cause feeling of prolapse  sporadic    Skin Integrity  Intact    Perineal Body/Introitus   --   tight   Pelvic Floor Internal Exam  Patient confirms identification and approves PT to assess the pelvic floor and treatment    Exam Type  Vaginal    Palpation  tenderness located on the bladder, urethra sphincter, bulbocavernosus, obturator internist, levator ani    Strength  Flicker               PT Education - 11/22/18 0840    Education Details  instruction on how to stretch the intoitus    Person(s) Educated  Patient    Methods  Explanation;Demonstration;Handout    Comprehension  Verbalized understanding       PT Short Term Goals - 11/22/18 0855      PT SHORT TERM GOAL #1   Title   independent with initial HEP    Baseline  not educated yet    Time  4    Period  Weeks    Status  New    Target Date  12/20/18      PT SHORT TERM GOAL #2   Title  understand how to massage the pelvic floor to improve tissue mobility    Baseline  not educated yet    Time  4    Period  Weeks    Status  New    Target Date  12/20/18      PT SHORT TERM GOAL #3   Title  pelvic floor strength is 2/5 due to improved tissue mobilty    Time  4    Period  Weeks    Status  New    Target Date  12/20/18      PT SHORT TERM GOAL #4   Title  lower abdominal pain decreased >/= 2/10 due to improve tissue mobilty    Baseline  pain level 3/10    Time  4    Period  Weeks    Status  New    Target Date  12/20/18        PT Long Term Goals - 11/22/18 0857      PT LONG TERM GOAL #1   Title  independent with HEP and understand how to progress herself    Baseline  not educated yet    Time  12    Period  Weeks  Status  New    Target Date  02/14/19      PT LONG TERM GOAL #2   Title  pain with penile penetration decreased >/= 0-1/10 due to improve tissue mobility    Baseline  pain level 3/10     Time  12    Period  Weeks    Status  New    Target Date  02/14/19      PT LONG TERM GOAL #3   Title  urinary leakage decreased to none to minimal with sneezng and coughing and not have to wear a pad, pelvic floor strength 3/5    Baseline  leakage with sneezng and coughing wearing a 1 pad per day; pelvic floor strength 1/5    Time  12    Period  Weeks    Status  New    Target Date  02/14/19      PT LONG TERM GOAL #4   Title  lower abdominal pain with sitting decreased >/= 0-1/10    Baseline  pain level 3/10    Time  12    Period  Weeks    Status  New    Target Date  02/14/19             Plan - 11/22/18 0847    Clinical Impression Statement  Patient is a 33 year old female with chronic pelvic pain. Her recent flare-up last November with pelvic pain and pain with urination. Patient  has interstitial Cystists. Patient reports intermittent pain at level 3/10 with sitting, urination and penile penetration. Patient leaks urine with sneezing and coughing. Patient wears 1 pad per day. Bilateral hip P/ROM for external rotation is 40 degrees.  Lumbar extension and bilateral sidebending decreased by 25%. L4-5 and T8-T12 decreased posterior anterior mobility.  bilateral hip extension and right hip external rotation is 3+/5. Pelvic floor strength is 1/5. Tenderness located on the left lower abdominal area, bilateral urethra sphincter, bulbocavernosus, uerthra sphincter, bladder, obturator internist, and levator ani. Patient has tightness in the pelvic floor muscles making it difficult to contract to prevent urinary leakage. Patient has urinary urgency. Patient will benefit from skilled therapy to reduce pain, urinary leakage, improve tissue mobility, and strength.     Personal Factors and Comorbidities  Sex;Comorbidity 1    Comorbidities  interstitial cystitis    Examination-Activity Limitations  Toileting;Continence    Clinical Decision Making  Moderate    Rehab Potential  Excellent    PT Frequency  1x / week    PT Duration  12 weeks    PT Treatment/Interventions  Biofeedback;Cryotherapy;Electrical Stimulation;Moist Heat;Ultrasound;Therapeutic activities;Therapeutic exercise;Neuromuscular re-education;Manual techniques;Patient/family education;Dry needling;Passive range of motion    PT Next Visit Plan  soft tissue work to abdomen and pelvic floor, hip stretches, bulging of pelvic floor, diaphragmatic breathing    Consulted and Agree with Plan of Care  Patient       Patient will benefit from skilled therapeutic intervention in order to improve the following deficits and impairments:  Increased fascial restricitons, Pain, Decreased coordination, Decreased mobility, Increased muscle spasms, Decreased activity tolerance, Decreased endurance, Decreased range of motion, Decreased  strength  Visit Diagnosis: Other muscle spasm - Plan: PT plan of care cert/re-cert  Other lack of coordination - Plan: PT plan of care cert/re-cert  Muscle weakness (generalized) - Plan: PT plan of care cert/re-cert  Interstitial cystitis - Plan: PT plan of care cert/re-cert     Problem List Patient Active Problem List   Diagnosis Date Noted  .  GERD without esophagitis 08/31/2018  . Wheezing 08/31/2018  . Depression, major, recurrent (HCC) 04/25/2018  . Generalized anxiety disorder 04/04/2018  . Pyelonephritis 11/04/2017  . Postoperative upper abdominal pain 10/31/2017  . Prediabetes 05/30/2017  . Vitamin D deficiency 05/30/2017  . Other hyperlipidemia 05/30/2017  . Class 1 obesity with serious comorbidity and body mass index (BMI) of 31.0 to 31.9 in adult 05/30/2017  . Migraine without aura and without status migrainosus, not intractable 05/09/2017  . Other fatigue 03/13/2017  . Hyperglycemia 03/13/2017  . Class 1 obesity with serious comorbidity and body mass index (BMI) of 33.0 to 33.9 in adult 03/13/2017  . Overactive bladder 11/14/2015  . Syncope and collapse 09/27/2015  . Nephrolithiasis 09/27/2015  . Acute pyelonephritis 09/27/2015  . IC (interstitial cystitis) 09/27/2015  . Leukocytosis   . Vesico-ureteral reflux 09/15/2014  . History of kidney stones 07/28/2014  . Syncope 03/20/2012    Eulis Foster, PT 11/22/18 9:04 AM   [facility] [address] [place], [place], [address] Phone: [phone]   Fax:  [phone]  Name: Lauren Cardenas MRN: [id number] Date of Birth: [birth year]

## 2018-11-22 NOTE — Patient Instructions (Signed)
STRETCHING THE PELVIC FLOOR MUSCLES NO DILATOR  Supplies . Vaginal lubricant . Mirror (optional) . Gloves (optional) Positioning . Start in a semi-reclined position with your head propped up. Bend your knees and place your thumb or finger at the vaginal opening. Procedure . Apply a moderate amount of lubricant on the outer skin of your vagina, the labia minora.  Apply additional lubricant to your finger. Marland Kitchen Spread the skin away from the vaginal opening. Place the end of your finger at the opening. . Do a maximum contraction of the pelvic floor muscles. Tighten the vagina and the anus maximally and relax. . When you know they are relaxed, gently and slowly insert your finger into your vagina, directing your finger slightly downward, for 2-3 inches of insertion. . Relax and stretch the 6 o'clock position . Hold each stretch for _2 min__ and repeat __1_ time with rest breaks of _1__ seconds between each stretch. . Repeat the stretching in the 4 o'clock and 8 o'clock positions. . Total time should be _6__ minutes, _1__ x per day.  Note the amount of theme your were able to achieve and your tolerance to your finger in your vagina. . Then swoosh your finger side to side  . Once you have accomplished the techniques you may try them in standing with one foot resting on the tub, or in other positions.  This is a good stretch to do in the shower if you don't need to use lubricant.  Lauren Cardenas daily  [facility] [address], [address] [place], [place] [address] Phone # [phone] Fax [phone]

## 2018-12-11 ENCOUNTER — Other Ambulatory Visit: Payer: Self-pay

## 2018-12-11 ENCOUNTER — Encounter: Payer: Self-pay | Admitting: Family Medicine

## 2018-12-11 ENCOUNTER — Ambulatory Visit: Payer: Medicaid Other | Admitting: Family Medicine

## 2018-12-11 VITALS — BP 120/79 | HR 71 | Temp 98.6°F | Ht 61.0 in | Wt 196.8 lb

## 2018-12-11 DIAGNOSIS — F411 Generalized anxiety disorder: Secondary | ICD-10-CM

## 2018-12-11 DIAGNOSIS — R41840 Attention and concentration deficit: Secondary | ICD-10-CM | POA: Diagnosis not present

## 2018-12-11 DIAGNOSIS — F330 Major depressive disorder, recurrent, mild: Secondary | ICD-10-CM | POA: Diagnosis not present

## 2018-12-11 NOTE — Progress Notes (Signed)
Subjective:     Lauren Cardenas Lauren Cardenas is a 33 y.o. female who presents for follow up of anxiety disorder. Current symptoms: difficulty concentrating, fatigue, irritable. She denies current suicidal and homicidal ideation. She complains of the following side effects from the treatment: none. States she is doing well with current medications. States she is having a very difficult time concentrating. States this has been ongoing since she was a child. Denies previous diagnosis of ADD or ADHD. States she feels she has it. Will refer to adult attention specialists for further evaluation and management.  GAD 7 : Generalized Anxiety Score 12/11/2018 05/23/2018 04/04/2018  Nervous, Anxious, on Edge 2 0 3  Control/stop worrying 0 0 3  Worry too much - different things 0 1 2  Trouble relaxing 0 0 3  Restless 0 0 0  Easily annoyed or irritable 0 0 2  Afraid - awful might happen 0 0 1  Total GAD 7 Score 2 1 14   Anxiety Difficulty - Not difficult at all -    [facility] 12/11/2018 12/11/2018 09/08/2018 08/31/2018 08/03/2018  Decreased Interest 0 0 0 0 0  Down, Depressed, Hopeless 0 0 0 0 0  PHQ - 2 Score 0 0 0 0 0  Altered sleeping 0 - - - -  Tired, decreased energy 1 - - - -  Change in appetite 0 - - - -  Feeling bad or failure about yourself  0 - - - -  Trouble concentrating 3 - - - -  Moving slowly or fidgety/restless 0 - - - -  Suicidal thoughts 0 - - - -  PHQ-9 Score 4 - - - -  Difficult doing work/chores - - - - -   Review of Systems  Constitutional: Positive for malaise/fatigue. Negative for chills, diaphoresis, fever and weight loss.  Respiratory: Negative for cough and shortness of breath.   Cardiovascular: Negative for chest pain, palpitations and leg swelling.  Musculoskeletal: Negative for myalgias.  Neurological: Negative for dizziness, weakness and headaches. Seizures: .assess.  Psychiatric/Behavioral: Negative for depression, hallucinations, memory loss, substance abuse  and suicidal ideas. The patient is nervous/anxious. The patient does not have insomnia.   All other systems reviewed and are negative.   The following portions of the patient's history were reviewed and updated as appropriate: allergies, current medications, past family history, past medical history, past social history, past surgical history and problem list.    Objective:    BP 120/79   Pulse 71   Temp 98.6 F (37 C) (Oral)   Ht 5\' 1"  (1.549 m)   Wt 196 lb 12.8 oz (89.3 kg)   BMI 37.19 kg/m   General:  alert, cooperative, appears stated age, no distress and mildly obese  Affect/Behavior:  full facial expressions, good grooming, good insight, normal perception, normal reasoning, normal speech pattern and content and normal thought patterns adequate concentration.    Physical Exam  Constitutional: She is oriented to person, place, and time and well-developed, well-nourished, and in no distress. No distress.  HENT:  Head: Normocephalic and atraumatic.  Eyes: Pupils are equal, round, and reactive to light.  Neck: Neck supple.  Cardiovascular: Normal rate and regular rhythm. Exam reveals no gallop and no friction rub.  No murmur heard. Pulmonary/Chest: Effort normal and breath sounds normal. No respiratory distress.  Neurological: She is alert and oriented to person, place, and time.  Skin: Skin is warm and dry. No rash noted. She is not diaphoretic. No erythema.  No pallor.  Psychiatric: Mood, memory, affect and judgment normal.     Assessment:   Lauren Cardenas was seen today for anxiety.  Diagnoses and all orders for this visit:  Mild episode of recurrent major depressive disorder ([place]) Generalized anxiety disorder Stable, continue current medication regimen.   Concentration deficit Pt concerned she has ADD or ADHD. States she would like something to help with concentration. Will refer to Adult attention specialists.  -     Ambulatory referral to Psychiatry    Plan:     Medications: Wellbutrin and Zoloft. Handouts describing disease, natural history, and treatment were given to the patient. Reviewed concept of anxiety as biochemical imbalance of neurotransmitters and rationale for treatment. Instructed patient to contact office or on-call physician promptly should condition worsen or any new symptoms appear and provided on-call telephone numbers. IF THE PATIENT HAS ANY SUICIDAL OR HOMICIDAL IDEATIONS, CALL THE OFFICE, DISCUSS WITH A SUPPORT MEMBER, OR GO TO THE ER IMMEDIATELY. Patient was agreeable with this plan. Follow up: 6 months. Spent 20 minutes (>50% of visit) discussing the risks of anxiety disorder, the  pathophysiology, etiology, risks, and principles of treatment.    The above assessment and management plan was discussed with the patient. The patient verbalized understanding of and has agreed to the management plan. Patient is aware to call the clinic if symptoms fail to improve or worsen. Patient is aware when to return to the clinic for a follow-up visit. Patient educated on when it is appropriate to go to the emergency department.   Monia Pouch, FNP-C [facility] [address] [place], [place] [address] [phone]

## 2018-12-27 ENCOUNTER — Inpatient Hospital Stay (HOSPITAL_COMMUNITY): Payer: Medicaid Other | Attending: Hematology

## 2018-12-27 ENCOUNTER — Other Ambulatory Visit: Payer: Self-pay

## 2018-12-27 DIAGNOSIS — E559 Vitamin D deficiency, unspecified: Secondary | ICD-10-CM | POA: Diagnosis not present

## 2018-12-27 DIAGNOSIS — D72829 Elevated white blood cell count, unspecified: Secondary | ICD-10-CM

## 2018-12-27 LAB — CBC WITH DIFFERENTIAL/PLATELET
Abs Immature Granulocytes: 0.06 10*3/uL (ref 0.00–0.07)
Basophils Absolute: 0.1 10*3/uL (ref 0.0–0.1)
Basophils Relative: 1 %
Eosinophils Absolute: 0.4 10*3/uL (ref 0.0–0.5)
Eosinophils Relative: 3 %
HCT: 39.8 % (ref 36.0–46.0)
Hemoglobin: 12.2 g/dL (ref 12.0–15.0)
Immature Granulocytes: 1 %
Lymphocytes Relative: 23 %
Lymphs Abs: 2.8 10*3/uL (ref 0.7–4.0)
MCH: 25.8 pg — ABNORMAL LOW (ref 26.0–34.0)
MCHC: 30.7 g/dL (ref 30.0–36.0)
MCV: 84.1 fL (ref 80.0–100.0)
Monocytes Absolute: 0.6 10*3/uL (ref 0.1–1.0)
Monocytes Relative: 5 %
Neutro Abs: 8.1 10*3/uL — ABNORMAL HIGH (ref 1.7–7.7)
Neutrophils Relative %: 67 %
Platelets: 275 10*3/uL (ref 150–400)
RBC: 4.73 MIL/uL (ref 3.87–5.11)
RDW: 13.9 % (ref 11.5–15.5)
WBC: 12.1 10*3/uL — ABNORMAL HIGH (ref 4.0–10.5)
nRBC: 0 % (ref 0.0–0.2)

## 2018-12-27 LAB — COMPREHENSIVE METABOLIC PANEL
ALT: 17 U/L (ref 0–44)
AST: 16 U/L (ref 15–41)
Albumin: 3.6 g/dL (ref 3.5–5.0)
Alkaline Phosphatase: 38 U/L (ref 38–126)
Anion gap: 12 (ref 5–15)
BUN: 15 mg/dL (ref 6–20)
CO2: 25 mmol/L (ref 22–32)
Calcium: 9.3 mg/dL (ref 8.9–10.3)
Chloride: 104 mmol/L (ref 98–111)
Creatinine, Ser: 0.84 mg/dL (ref 0.44–1.00)
GFR calc Af Amer: >60 mL/min (ref >60)
GFR calc non Af Amer: >60 mL/min (ref >60)
Glucose, Bld: 112 mg/dL — ABNORMAL HIGH (ref 70–99)
Potassium: 4.6 mmol/L (ref 3.5–5.1)
Sodium: 141 mmol/L (ref 135–145)
Total Bilirubin: 0.3 mg/dL (ref 0.3–1.2)
Total Protein: 7.3 g/dL (ref 6.5–8.1)

## 2018-12-27 LAB — LACTATE DEHYDROGENASE: LDH: 101 U/L (ref 98–192)

## 2018-12-28 LAB — VITAMIN D 25 HYDROXY (VIT D DEFICIENCY, FRACTURES): Vit D, 25-Hydroxy: 30.9 ng/mL (ref 30.0–100.0)

## 2019-01-01 ENCOUNTER — Encounter: Payer: Self-pay | Admitting: Physical Therapy

## 2019-01-01 ENCOUNTER — Ambulatory Visit: Payer: Medicaid Other | Attending: Urology | Admitting: Physical Therapy

## 2019-01-01 ENCOUNTER — Other Ambulatory Visit: Payer: Self-pay

## 2019-01-01 DIAGNOSIS — M62838 Other muscle spasm: Secondary | ICD-10-CM | POA: Diagnosis not present

## 2019-01-01 DIAGNOSIS — R278 Other lack of coordination: Secondary | ICD-10-CM | POA: Diagnosis not present

## 2019-01-01 DIAGNOSIS — N3010 Interstitial cystitis (chronic) without hematuria: Secondary | ICD-10-CM | POA: Diagnosis not present

## 2019-01-01 DIAGNOSIS — M6281 Muscle weakness (generalized): Secondary | ICD-10-CM | POA: Insufficient documentation

## 2019-01-01 NOTE — Patient Instructions (Addendum)
Moisturizers . They are used in the vagina to hydrate the mucous membrane that make up the vaginal canal. . Designed to keep a more normal acid balance (ph) . Once placed in the vagina, it will last between two to three days.  . Use 2-3 times per week at bedtime and last longer than 60 min. . Ingredients to avoid is glycerin and fragrance, can increase chance of infection . Should not be used just before sex due to causing irritation . Most are gels administered either in a tampon-shaped applicator or as a vaginal suppository. They are non-hormonal.   Types of Moisturizers . Leatrice JewelsLuvena- drug store . Vitamin E vaginal suppositories- Whole foods, Amazon . Moist Again . Coconut oil- can break down condoms . Julva- (Do no use if on Tamoxifen) amazon . Yes moisturizer- amazon . NeuEve Silk , NeuEve Silver for menopausal or over 65 (if have severe vaginal atrophy or cancer treatments use NeuEve Silk for  1 month than move to Home DepoteuEve Silver)- Dana Corporationmazon, ShapeConsultant.com.cyNeuve.com . Olive and Bee intimate cream- www.oliveandbee.com.au . Mae vaginal moisturizer- Amazon  Creams to use externally on the Vulva area  Marathon OilDesert Harvest Releveum (good for for cancer patients that had radiation to the area)- Guamamazon or Newell Rubbermaidwww.[email or web address]://garcia-[email or web address]  V-magic cream - amazon  Julva-amazon  Vital "V Wild Yam salve ( help moisturize and help with thinning vulvar area, does have Beeswax  The KrogerMoodMaid Botanical Pro-Meno Wild Yam Cream- Amazon  Desert Harvest Gele  Cleo by Zane Heraldamiva labial moisturizer (Amazon,   Coconut oil   Things to avoid in the vaginal area . Do not use things to irritate the vulvar area . No lotions just specialized creams for the vulva area- Neogyn, V-magic, No soaps; can use Aveeno or Calendula cleanser if needed. Must be gentle . No deodorants . No douches . Good to sleep without underwear to let the vaginal area to air out . No scrubbing: spread the lips to let warm water rinse over labias and pat  dry   Lubrication . Used for intercourse to reduce friction . Avoid ones that have glycerin, warming gels, tingling gels, icing or cooling gel, scented . Avoid parabens due to a preservative similar to female sex hormone . May need to be reapplied once or several times during sexual activity . Can be applied to both partners genitals prior to vaginal penetration to minimize friction or irritation . Prevent irritation and mucosal tears that cause post coital pain and increased the risk of vaginal and urinary tract infections . Oil-based lubricants cannot be used with condoms due to breaking them down.  Least likely to irritate vaginal tissue.  . Plant based-lubes are safe . Silicone-based lubrication are thicker and last long and used for post-menopausal women  Vaginal Lubricators Here is a list of some suggested lubricators you can use for intercourse. Use the most hypoallergenic product.  You can place on you or your partner.   Slippery Stuff  Sylk or Sliquid Natural H2O ( good  if frequent UTI's)  Blossom Organics (www.blossom-organics.com)  Luvena   Coconut oil  PJur Woman Nude- water based lubricant, amazon  Uberlube- Amazon  Aloe Vera  Yes lubricant- WellPointmazon  Wet Platinum-Silicone, Target, [facility]  Olive and Bee intimate cream-  www.oliveandbee.com.au  Pink - Amazon Things to avoid in lubricants are glycerin, warming gels, tingling gels, icing or cooling  gels, and scented gels.  Also avoid Vaseline. KY jelly, Replens, and Astroglide kills good bacteria(lactobacilli)  Things to avoid in the vaginal area .  Do not use things to irritate the vulvar area . No lotions- see below . No soaps; can use Aveeno or Calendula cleanser if needed. Must be gentle . No deodorants . No douches . Good to sleep without underwear to let the vaginal area to air out . No scrubbing: spread the lips to let warm water rinse over labias and pat dry  Creams that can be used on the  [place] Releveum or Desert Tenneco Inc Code: Boeing  URL: [email or web address]  Date: 01/01/2019  Prepared by: Earlie Counts   Exercises Seated Piriformis Stretch with Trunk Bend - 2 reps - 1 sets - 30sec hold - 1x daily - 7x weekly Seated Hamstring Stretch - 2 reps - 1 sets - 30 sec hold - 1x daily - 7x weekly Quadruped Cat Camel - 10 reps - 1 sets - 1x daily - 7x weekly Child's Pose Stretch - 1 reps - 1 sets - 1 min hold - 1x daily - 7x weekly Childs Pose Side Bend - 2 reps - 1 sets - 30 sec hold - 1x daily - 7x weekly Prone Press Up - 2 reps - 1 sets - 5 sec hold - 1x daily - 7x weekly Supine Lower Trunk Rotation - 2 reps - 1 sets - 15 sec hold - 1x daily - 7x weekly Supine Pelvic Floor Stretch - 1 reps - 1 sets - 30 sec hold - 1x daily - 7x weekly Seated Hip Flexor Stretch - 2 reps - 1 sets - 30 sec hold - 1x daily - 7x weekly  [facility] [address] [place], [place] [address] Phone # [phone] Fax [phone]

## 2019-01-01 NOTE — Therapy (Signed)
[facility] [address] [place], [place], [address] Phone: [phone]   Fax:  [phone]  Physical Therapy Treatment  Patient Details  Name: Lauren CurlingRachael D Wesenberg MRN: [id number] Date of Birth: July 09, 1985 Referring Provider (PT): Vianne BullsBree Fleck, [facility]   Encounter Date: 01/01/2019  PT End of Session - 01/01/19 0904    Visit Number  2    Date for PT Re-Evaluation  02/14/19    Authorization Type  Medicaid    Authorization Time Period  12/31/18-01/30/19    Authorization - Visit Number  2    Authorization - Number of Visits  4    PT Start Time  0900    PT Stop Time  0940    PT Time Calculation (min)  40 min    Activity Tolerance  Patient tolerated treatment well;No increased pain    Behavior During Therapy  WFL for tasks assessed/performed       Past Medical History:  Diagnosis Date  . Chronic migraine    neurologist-  dr Lucia Gaskinsahern  . Constipation   . Depression 05/30/2017   with emotional eating  . Dysuria   . GERD (gastroesophageal reflux disease)   . History of acute pyelonephritis    09-27-2015:   11-04-2017  w/ sepsis due to obstruction from kidney stone  . History of kidney stones   . IC (interstitial cystitis)   . Interstitial cystitis   . Left ureteral stone   . OAB (overactive bladder)   . Pre-diabetes   . Urge urinary incontinence   . Urgency of urination   . Urinary reflux   . Vesicoureteral reflux     Past Surgical History:  Procedure Laterality Date  . CHOLECYSTECTOMY N/A 10/26/2017   Procedure: LAPAROSCOPIC CHOLECYSTECTOMY WITH INTRAOPERATIVE CHOLANGIOGRAM ERAS PATHWAY;  Surgeon: Harriette Bouillonornett, Thomas, MD;  Location: [facility];  Service: General;  Laterality: N/A;  . CYSTOSCOPY W/ URETERAL STENT PLACEMENT Right 09/27/2015   Procedure: CYSTOSCOPY WITH RETROGRADE PYELOGRAM/URETERAL STENT PLACEMENT;  Surgeon: Hildred LaserBrian James Budzyn, MD;  Location: [facility];  Service: Urology;  Laterality: Right;  . CYSTOSCOPY WITH RETROGRADE  PYELOGRAM, URETEROSCOPY AND STENT PLACEMENT Left 09/09/2014   Procedure: CYSTOSCOPY WITH LEFT RETROGRADE PYELOGRAM, URETEROSCOPY AND STONE EXTRACTION  DOUBLE J STENT PLACEMENT;  Surgeon: Jerilee FieldMatthew Eskridge, MD;  Location: [facility];  Service: Urology;  Laterality: Left;  . CYSTOSCOPY WITH STENT PLACEMENT Left 11/04/2017   Procedure: CYSTOSCOPY WITH RETROGRADE AND LEFT STENT PLACEMENT;  Surgeon: Crista ElliotBell, Eugene D III, MD;  Location: [facility];  Service: Urology;  Laterality: Left;  . CYSTOSCOPY/URETEROSCOPY/HOLMIUM LASER/STENT PLACEMENT Right 10/16/2015   Procedure: RIGHT URETEROSCOPY/HOLMIUM LASER/STENT PLACEMENT;  Surgeon: Jerilee FieldMatthew Eskridge, MD;  [facility] [place];  Service: Urology;  Laterality: Right;  . CYSTOSCOPY/URETEROSCOPY/HOLMIUM LASER/STENT PLACEMENT Left 11/21/2017   Procedure: CYSTOSCOPY/URETEROSCOPY/HOLMIUM LASER/STENT PLACEMENT;  Surgeon: Jerilee FieldEskridge, Matthew, MD;  [facility] [place];  Service: Urology;  Laterality: Left;  ONLY NEEDS 60 MIN  . HOLMIUM LASER APPLICATION Left 09/09/2014   Procedure: HOLMIUM LASER APPLICATION;  Surgeon: Jerilee FieldMatthew Eskridge, MD;  Location: [facility];  Service: Urology;  Laterality: Left;  . HOLMIUM LASER APPLICATION Right 10/16/2015   Procedure: HOLMIUM LASER APPLICATION;  Surgeon: Jerilee FieldMatthew Eskridge, MD;  [facility] [place];  Service: Urology;  Laterality: Right;  . STONE EXTRACTION WITH BASKET Right 10/16/2015   Procedure: STONE EXTRACTION WITH BASKET;  Surgeon: Jerilee FieldMatthew Eskridge, MD;  [facility] [place];  Service: Urology;  Laterality: Right;  . WISDOM TOOTH EXTRACTION  03/2014  There were no vitals filed for this visit.  Subjective Assessment - 01/01/19 0902    Subjective  I felt fine after therapy. No changes since then.    Patient Stated Goals  less pain, be able to fully empty her bladder    Currently in Pain?  Yes    Pain Score  3     Pain Location  Abdomen    Pain Orientation  Mid;Lower    Pain  Descriptors / Indicators  Burning;Sharp    Pain Type  Chronic pain    Pain Onset  More than a month ago    Aggravating Factors   sitting, urinating, intercourse with initial penetration    Pain Relieving Factors  uses a donut pillow to sit    Multiple Pain Sites  No                       [facility] Adult PT Treatment/Exercise - 01/01/19 0001      Self-Care   Self-Care  Other Self-Care Comments    Other Self-Care Comments   instruction on vaginal moisturizers, lubricants and vaginal care      Lumbar Exercises: Stretches   Active Hamstring Stretch  Right;Left;1 rep;30 seconds   sittng   Lower Trunk Rotation  2 reps;30 seconds   supine both ways   Hip Flexor Stretch  Right;Left;1 rep;30 seconds   sitting   Press Ups  2 reps;5 seconds    Quadruped Mid Back Stretch  3 reps;30 seconds   all three ways   Piriformis Stretch  Right;Left;1 rep;30 seconds   sitting   Other Lumbar Stretch Exercise  happy baby hold 30 sec      Lumbar Exercises: Quadruped   Madcat/Old Horse  10 reps    Madcat/Old Horse Limitations  with breath      Manual Therapy   Manual Therapy  Myofascial release;Soft tissue mobilization    Soft tissue mobilization  soft tissue work to the abdomen to work on peristalic motion around the abdoment    Myofascial Release  to right abdomen,  right and lower quadrant to release the tissue and suprapubically to release around the bladder             PT Education - 01/01/19 0949    Education Details  Access Code: [email or web address]; information on vaginal moisturizers and lubricants    Person(s) Educated  Patient    Methods  Explanation;Demonstration;Handout    Comprehension  Returned demonstration;Verbalized understanding       PT Short Term Goals - 11/22/18 0855      PT SHORT TERM GOAL #1   Title  independent with initial HEP    Baseline  not educated yet    Time  4    Period  Weeks    Status  New    Target Date  12/20/18      PT SHORT TERM GOAL #2    Title  understand how to massage the pelvic floor to improve tissue mobility    Baseline  not educated yet    Time  4    Period  Weeks    Status  New    Target Date  12/20/18      PT SHORT TERM GOAL #3   Title  pelvic floor strength is 2/5 due to improved tissue mobilty    Time  4    Period  Weeks    Status  New    Target Date  12/20/18  PT SHORT TERM GOAL #4   Title  lower abdominal pain decreased >/= 2/10 due to improve tissue mobilty    Baseline  pain level 3/10    Time  4    Period  Weeks    Status  New    Target Date  12/20/18        PT Long Term Goals - 11/22/18 0857      PT LONG TERM GOAL #1   Title  independent with HEP and understand how to progress herself    Baseline  not educated yet    Time  12    Period  Weeks    Status  New    Target Date  02/14/19      PT LONG TERM GOAL #2   Title  pain with penile penetration decreased >/= 0-1/10 due to improve tissue mobility    Baseline  pain level 3/10     Time  12    Period  Weeks    Status  New    Target Date  02/14/19      PT LONG TERM GOAL #3   Title  urinary leakage decreased to none to minimal with sneezng and coughing and not have to wear a pad, pelvic floor strength 3/5    Baseline  leakage with sneezng and coughing wearing a 1 pad per day; pelvic floor strength 1/5    Time  12    Period  Weeks    Status  New    Target Date  02/14/19      PT LONG TERM GOAL #4   Title  lower abdominal pain with sitting decreased >/= 0-1/10    Baseline  pain level 3/10    Time  12    Period  Weeks    Status  New    Target Date  02/14/19            Plan - 01/01/19 [id number]    Clinical Impression Statement  After treatment patient had no abdominal  pain and was able to bend over to place her shoes on for first time. Patient has increased restrictions of fascia in lower abdoment and upper right quadrant. Patient understands her stretches to release the hips and pelvic floor. Patient was educated on vaginal  moisturizers to reduce vaginal dryness. Patient will benefit from skilled therapy to reduce pain, urinary leakage, improve tissue mobility, and strength.    Personal Factors and Comorbidities  Sex;Comorbidity 1    Comorbidities  interstitial cystitis    Examination-Activity Limitations  Toileting;Continence    Rehab Potential  Excellent    PT Frequency  1x / week    PT Duration  12 weeks    PT Treatment/Interventions  Biofeedback;Cryotherapy;Electrical Stimulation;Moist Heat;Ultrasound;Therapeutic activities;Therapeutic exercise;Neuromuscular re-education;Manual techniques;Patient/family education;Dry needling;Passive range of motion    PT Next Visit Plan  soft tissue work to abdomen and pelvic floor,  bulging of pelvic floor, diaphragmatic breathing;    PT Home Exercise Plan  Access Code: [email or web address]    Recommended Other Services  MD signed initial eval    Consulted and Agree with Plan of Care  Patient       Patient will benefit from skilled therapeutic intervention in order to improve the following deficits and impairments:  Increased fascial restricitons, Pain, Decreased coordination, Decreased mobility, Increased muscle spasms, Decreased activity tolerance, Decreased endurance, Decreased range of motion, Decreased strength  Visit Diagnosis: 1. Other muscle spasm   2. Other lack of coordination   3. Muscle  weakness (generalized)   4. Interstitial cystitis        Problem List Patient Active Problem List   Diagnosis Date Noted  . GERD without esophagitis 08/31/2018  . Wheezing 08/31/2018  . Depression, major, recurrent ([place]) 04/25/2018  . Generalized anxiety disorder 04/04/2018  . Pyelonephritis 11/04/2017  . Postoperative upper abdominal pain 10/31/2017  . Prediabetes 05/30/2017  . Vitamin D deficiency 05/30/2017  . Other hyperlipidemia 05/30/2017  . Class 1 obesity with serious comorbidity and body mass index (BMI) of 31.0 to 31.9 in adult 05/30/2017  . Migraine without aura  and without status migrainosus, not intractable 05/09/2017  . Other fatigue 03/13/2017  . Hyperglycemia 03/13/2017  . Class 1 obesity with serious comorbidity and body mass index (BMI) of 33.0 to 33.9 in adult 03/13/2017  . Overactive bladder 11/14/2015  . Syncope and collapse 09/27/2015  . Nephrolithiasis 09/27/2015  . Acute pyelonephritis 09/27/2015  . IC (interstitial cystitis) 09/27/2015  . Leukocytosis   . Vesico-ureteral reflux 09/15/2014  . History of kidney stones 07/28/2014  . Syncope 03/20/2012    Earlie Counts, PT 01/01/19 9:58 AM   [facility] [address] [place], [place], [address] Phone: [phone]   Fax:  [phone]  Name: SHARISE LIPPY MRN: [id number] Date of Birth: 1985/11/07

## 2019-01-03 ENCOUNTER — Encounter (HOSPITAL_COMMUNITY): Payer: Self-pay | Admitting: Hematology

## 2019-01-03 ENCOUNTER — Other Ambulatory Visit: Payer: Self-pay

## 2019-01-03 ENCOUNTER — Inpatient Hospital Stay (HOSPITAL_BASED_OUTPATIENT_CLINIC_OR_DEPARTMENT_OTHER): Payer: Medicaid Other | Admitting: Hematology

## 2019-01-03 DIAGNOSIS — E559 Vitamin D deficiency, unspecified: Secondary | ICD-10-CM | POA: Diagnosis not present

## 2019-01-03 DIAGNOSIS — D72829 Elevated white blood cell count, unspecified: Secondary | ICD-10-CM

## 2019-01-03 NOTE — Assessment & Plan Note (Signed)
1.  Leukocytosis: - Leukocytosis, predominantly neutrophilic and occasional monocytosis since 2013. -She was a never smoker.  Denies any chronic steroid use.  Denies any recurrent infections. -CT of the abdomen and pelvis with contrast on 10/07/2017 shows normal spleen and liver.  -CT chest PE protocol on 10/31/2017 was negative for PE and other abnormalities. -Denies any fevers, night sweats or weight loss.  Physical examination did not reveal any lymphadenopathy or splenomegaly. -We reviewed the results of the blood work.  BCR/ABL by FISH was negative ruling out CML.  Jak 2 V6 91F testing was also negative.  LDH was normal. - Serum quantitative immunoglobulins were normal. - Most recent CBC from December 27, 2018 revealed a white blood cell count of 12.1, with elevated neutrophils at 8.1, hemoglobin 12.2, platelets 270 5K.  Patient does not have any B symptoms.  No lymphadenopathy.  We did discuss possibility of bone marrow biopsy which I do not feel is indicated at this time.  We will continue to monitor white blood cell count.  Patient instructed if she develops any B symptoms to return to clinic sooner than 6 months. - RTC in 6 mths   2.  Vitamin D deficiency: -Continue1000 international units of vitamin D daily.

## 2019-01-03 NOTE — Progress Notes (Signed)
[place], [place] [address]   CLINIC:  Medical Oncology/Hematology  PCP:  Baruch Gouty, [place] [address] [phone]   REASON FOR VISIT:  Follow-up for Leukocytosis  CURRENT THERAPY: Clinical surveillance   INTERVAL HISTORY:  Ms. Roedl 33 y.o. female presents today for follow-up.  She reports overall doing well.  She denies any significant fatigue.  She denies any recurrent infections or hospitalizations.  She denies any fevers, chills, night sweats.  No weight loss.  No lymphadenopathy.  No change in bowel habits.  She is here to review most recent labs.   REVIEW OF SYSTEMS:  Review of Systems  All other systems reviewed and are negative.    PAST MEDICAL/SURGICAL HISTORY:  Past Medical History:  Diagnosis Date  . Chronic migraine    neurologist-  dr Jaynee Eagles  . Constipation   . Depression 05/30/2017   with emotional eating  . Dysuria   . GERD (gastroesophageal reflux disease)   . History of acute pyelonephritis    09-27-2015:   11-04-2017  w/ sepsis due to obstruction from kidney stone  . History of kidney stones   . IC (interstitial cystitis)   . Interstitial cystitis   . Left ureteral stone   . OAB (overactive bladder)   . Pre-diabetes   . Urge urinary incontinence   . Urgency of urination   . Urinary reflux   . Vesicoureteral reflux    Past Surgical History:  Procedure Laterality Date  . CHOLECYSTECTOMY N/A 10/26/2017   Procedure: LAPAROSCOPIC CHOLECYSTECTOMY WITH INTRAOPERATIVE CHOLANGIOGRAM ERAS PATHWAY;  Surgeon: Erroll Luna, MD;  Location: [facility];  Service: General;  Laterality: N/A;  . CYSTOSCOPY W/ URETERAL STENT PLACEMENT Right 09/27/2015   Procedure: CYSTOSCOPY WITH RETROGRADE PYELOGRAM/URETERAL STENT PLACEMENT;  Surgeon: Nickie Retort, MD;  Location: [facility];  Service: Urology;  Laterality: Right;  . CYSTOSCOPY WITH RETROGRADE PYELOGRAM, URETEROSCOPY AND STENT PLACEMENT Left  09/09/2014   Procedure: CYSTOSCOPY WITH LEFT RETROGRADE PYELOGRAM, URETEROSCOPY AND STONE EXTRACTION  DOUBLE J STENT PLACEMENT;  Surgeon: Festus Aloe, MD;  [facility];  Service: Urology;  Laterality: Left;  . CYSTOSCOPY WITH STENT PLACEMENT Left 11/04/2017   Procedure: CYSTOSCOPY WITH RETROGRADE AND LEFT STENT PLACEMENT;  Surgeon: Lucas Mallow, MD;  [facility];  Service: Urology;  Laterality: Left;  . CYSTOSCOPY/URETEROSCOPY/HOLMIUM LASER/STENT PLACEMENT Right 10/16/2015   Procedure: RIGHT URETEROSCOPY/HOLMIUM LASER/STENT PLACEMENT;  Surgeon: Festus Aloe, MD;  [facility];  Service: Urology;  Laterality: Right;  . CYSTOSCOPY/URETEROSCOPY/HOLMIUM LASER/STENT PLACEMENT Left 11/21/2017   Procedure: CYSTOSCOPY/URETEROSCOPY/HOLMIUM LASER/STENT PLACEMENT;  Surgeon: Festus Aloe, MD;  Location: [facility];  Service: Urology;  Laterality: Left;  ONLY NEEDS 60 MIN  . HOLMIUM LASER APPLICATION Left 09/09/4006   Procedure: HOLMIUM LASER APPLICATION;  Surgeon: Festus Aloe, MD;  [facility];  Service: Urology;  Laterality: Left;  . HOLMIUM LASER APPLICATION Right 6/[birth year]/1950   Procedure: HOLMIUM LASER APPLICATION;  Surgeon: Festus Aloe, MD;  Location: [facility];  Service: Urology;  Laterality: Right;  . STONE EXTRACTION WITH BASKET Right 10/16/2015   Procedure: STONE EXTRACTION WITH BASKET;  Surgeon: Festus Aloe, MD;  Location: [facility];  Service: Urology;  Laterality: Right;  . WISDOM TOOTH EXTRACTION  03/2014     SOCIAL HISTORY:  Social History   Socioeconomic History  . Marital status: Married    Spouse name: Broadus John  . Number of children: 1  .  Years of education: 45  . Highest education level: Not on file  Occupational History  . 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 Financial resource strain: Not on file  . Food insecurity    Worry: Not on file    Inability:  Not on file  . Transportation needs    Medical: Not on file    Non-medical: Not on file  Tobacco Use  . Smoking status: Never Smoker  . Smokeless tobacco: Never Used  Substance and Sexual Activity  . Alcohol use: No  . Drug use: No  . Sexual activity: Yes    Birth control/protection: None  Lifestyle  . Physical activity    Days per week: Not on file    Minutes per session: Not on file  . Stress: Not on file  Relationships  . Social Herbalist on phone: Not on file    Gets together: Not on file    Attends religious service: Not on file    Active member of club or organization: Not on file    Attends meetings of clubs or organizations: Not on file    Relationship status: Not on file  . Intimate partner violence    Fear of current or ex partner: Not on file    Emotionally abused: Not on file    Physically abused: Not on file    Forced sexual activity: Not on file  Other Topics Concern  . Not on file  Social History Narrative   Lives with husband and daughter and in-laws   Caffeine use: 100-146m/week   She drinks about 3 cups of diet caffeine soda per week   She has been going to [employer] [place]:  Family History  Problem Relation Age of Onset  . Hypertension Mother   . Obesity Mother   . Diabetes Father   . Kidney disease Father   . Sudden death Father     CURRENT MEDICATIONS:  Outpatient Encounter Medications as of 01/03/2019  Medication Sig  . albuterol (PROVENTIL HFA;VENTOLIN HFA) 108 (90 Base) MCG/ACT inhaler Inhale 2 puffs into the lungs every 6 (six) hours as needed for wheezing or shortness of breath.  .Vassie Moselle0.1-20 MG-MCG tablet Take 1 tablet by mouth at bedtime.   .Marland KitchenbuPROPion (WELLBUTRIN SR) 200 MG 12 hr tablet Take 1 tablet (200 mg total) by mouth at bedtime.  . cetirizine (ZYRTEC) 10 MG chewable tablet Chew 10 mg by mouth daily. Per pt  . famotidine (PEPCID) 20 MG tablet Take 1 tablet (20 mg total) by mouth 2  (two) times daily for 30 days.  . fluticasone (FLOVENT HFA) 44 MCG/ACT inhaler Inhale 1 puff into the lungs 2 (two) times daily.  .Marland Kitchenibuprofen (ADVIL,MOTRIN) 200 MG tablet Take 400 mg by mouth every 6 (six) hours as needed for mild pain.  . metFORMIN (GLUCOPHAGE) 500 MG tablet TAKE 1 TABLET DAILY  . Meth-Hyo-M Bl-Na Phos-Ph Sal (URIBEL) 118 MG CAPS   . Misc Natural Products (CRANBERRY/PROBIOTIC) TABS Take 1 tablet by mouth at bedtime.  . Multiple Vitamin (MULTI-VITAMIN DAILY PO) Take 1 tablet by mouth at bedtime.   . OMEGA-3 FATTY ACIDS PO Take by mouth daily. Per pt she was unsure of the dosage.  . propranolol ER (INDERAL LA) 120 MG 24 hr capsule Take 1 capsule (120 mg total) by mouth at bedtime. No further refills will be provided until seen in office.  . Red Yeast  Rice 600 MG CAPS Take 2,400 mg by mouth 2 times daily at 12 noon and 4 pm. Per pt takes 1200 mg BID  . rizatriptan (MAXALT-MLT) 10 MG disintegrating tablet Take 1 tablet (10 mg total) by mouth 3 (three) times daily as needed for migraine.  . sertraline (ZOLOFT) 50 MG tablet Take 1 tablet (50 mg total) by mouth at bedtime.   No facility-administered encounter medications on file as of 01/03/2019.     ALLERGIES:  Allergies  Allergen Reactions  . Sudafed [Pseudoephedrine Hcl] Shortness Of Breath  . Amoxicillin Hives    ++ got ceftriaxone in the past++ Has patient had a PCN reaction causing immediate rash, facial/tongue/throat swelling, SOB or lightheadedness with hypotension: Yes Has patient had a PCN reaction causing severe rash involving mucus membranes or skin necrosis: No Has patient had a PCN reaction that required hospitalization: No Has patient had a PCN reaction occurring within the last 10 years: Yes If all of the above answers are "NO", then may proceed with Cephalosporin use.      PHYSICAL EXAM:  ECOG Performance status: 0  Vitals:   01/03/19 0800  BP: 130/84  Pulse: 75  Resp: 16  Temp: 97.7 F (36.5 C)   SpO2: 98%   Filed Weights   01/03/19 0800  Weight: 195 lb 7 oz (88.6 kg)    Physical Exam Vitals signs reviewed.  Constitutional:      Appearance: Normal appearance. She is obese.  HENT:     Head: Normocephalic.     Nose: Nose normal.     Mouth/Throat:     Mouth: Mucous membranes are moist.     Pharynx: Oropharynx is clear.  Eyes:     Extraocular Movements: Extraocular movements intact.     Conjunctiva/sclera: Conjunctivae normal.  Neck:     Musculoskeletal: Normal range of motion.  Cardiovascular:     Rate and Rhythm: Normal rate and regular rhythm.     Pulses: Normal pulses.     Heart sounds: Normal heart sounds.  Pulmonary:     Effort: Pulmonary effort is normal.     Breath sounds: Normal breath sounds.  Abdominal:     General: Bowel sounds are normal.  Musculoskeletal: Normal range of motion.  Skin:    General: Skin is warm and dry.  Neurological:     General: No focal deficit present.     Mental Status: She is alert and oriented to person, place, and time.  Psychiatric:        Mood and Affect: Mood normal.        Behavior: Behavior normal.        Thought Content: Thought content normal.        Judgment: Judgment normal.      LABORATORY DATA:  I have reviewed the labs as listed.  CBC    Component Value Date/Time   WBC 12.1 (H) 12/27/2018 0841   RBC 4.73 12/27/2018 0841   HGB 12.2 12/27/2018 0841   HGB 13.0 08/31/2018 1059   HCT 39.8 12/27/2018 0841   HCT 40.1 08/31/2018 1059   PLT 275 12/27/2018 0841   PLT 279 08/31/2018 1059   MCV 84.1 12/27/2018 0841   MCV 82 08/31/2018 1059   MCH 25.8 (L) 12/27/2018 0841   MCHC 30.7 12/27/2018 0841   RDW 13.9 12/27/2018 0841   RDW 13.9 08/31/2018 1059   LYMPHSABS 2.8 12/27/2018 0841   LYMPHSABS 3.0 08/31/2018 1059   MONOABS 0.6 12/27/2018 0841   EOSABS 0.4 12/27/2018  0841   EOSABS 1.1 (H) 08/31/2018 1059   BASOSABS 0.1 12/27/2018 0841   BASOSABS 0.1 08/31/2018 1059   CMP Latest Ref Rng & Units 12/27/2018  09/13/2018 08/31/2018  Glucose 70 - 99 mg/dL 112(H) 85 72  BUN 6 - 20 mg/dL _0 Creatinine 0.44 - 1.00 mg/dL 0.84 0.78 0.84  Sodium 135 - 145 mmol/L 141 140 142  Potassium 3.5 - 5.1 mmol/L 4.6 3.8 5.2  Chloride 98 - 111 mmol/L 104 106 104  CO2 22 - 32 mmol/L _1 Calcium 8.9 - 10.3 mg/dL 9.3 9.2 9.4  Total Protein 6.5 - 8.1 g/dL 7.3 8.1 7.2  Total Bilirubin 0.3 - 1.2 mg/dL 0.3 0.4 0.3  Alkaline Phos 38 - 126 U/L 38 46 54  AST 15 - 41 U/L 16 32 23  ALT 0 - 44 U/L 17 38 20         ASSESSMENT & PLAN:   Leukocytosis 1.  Leukocytosis: - Leukocytosis, predominantly neutrophilic and occasional monocytosis since 2013. -She was a never smoker.  Denies any chronic steroid use.  Denies any recurrent infections. -CT of the abdomen and pelvis with contrast on 10/07/2017 shows normal spleen and liver.  -CT chest PE protocol on 10/31/2017 was negative for PE and other abnormalities. -Denies any fevers, night sweats or weight loss.  Physical examination did not reveal any lymphadenopathy or splenomegaly. -We reviewed the results of the blood work.  BCR/ABL by FISH was negative ruling out CML.  Jak 2 V6 81F testing was also negative.  LDH was normal. - Serum quantitative immunoglobulins were normal. - Most recent CBC from December 27, 2018 revealed a white blood cell count of 12.1, with elevated neutrophils at 8.1, hemoglobin 12.2, platelets 270 5K.  Patient does not have any B symptoms.  No lymphadenopathy.  We did discuss possibility of bone marrow biopsy which I do not feel is indicated at this time.  We will continue to monitor white blood cell count.  Patient instructed if she develops any B symptoms to return to clinic sooner than 6 months. - RTC in 6 mths   2.  Vitamin D deficiency: -Continue1000 international units of vitamin D daily.        Orders placed this encounter:  Orders Placed This Encounter  Procedures  . CBC with Differential  . Comprehensive metabolic panel  .  Lactate dehydrogenase      Roger Shelter, [place] [phone]

## 2019-01-14 ENCOUNTER — Other Ambulatory Visit: Payer: Self-pay

## 2019-01-14 ENCOUNTER — Encounter: Payer: Self-pay | Admitting: Physical Therapy

## 2019-01-14 ENCOUNTER — Ambulatory Visit: Payer: Medicaid Other | Admitting: Physical Therapy

## 2019-01-14 DIAGNOSIS — M62838 Other muscle spasm: Secondary | ICD-10-CM

## 2019-01-14 DIAGNOSIS — R278 Other lack of coordination: Secondary | ICD-10-CM

## 2019-01-14 DIAGNOSIS — N3010 Interstitial cystitis (chronic) without hematuria: Secondary | ICD-10-CM

## 2019-01-14 DIAGNOSIS — M6281 Muscle weakness (generalized): Secondary | ICD-10-CM

## 2019-01-14 NOTE — Therapy (Signed)
[facility] [address] [place], [place], [address] Phone: [phone]   Fax:  [phone]  Physical Therapy Treatment  Patient Details  Name: Lauren CurlingRachael D Cardenas MRN: [id number] Date of Birth: 06/12/1986 Referring Provider (PT): Vianne BullsBree Fleck, [facility]   Encounter Date: 01/14/2019  PT End of Session - 01/14/19 0953    Visit Number  3    Date for PT Re-Evaluation  02/14/19    Authorization Type  Medicaid    Authorization Time Period  12/31/18-01/30/19    Authorization - Visit Number  3    Authorization - Number of Visits  4    PT Start Time  0905    PT Stop Time  0950    PT Time Calculation (min)  45 min    Activity Tolerance  Patient tolerated treatment well;No increased pain    Behavior During Therapy  WFL for tasks assessed/performed       Past Medical History:  Diagnosis Date  . Chronic migraine    neurologist-  dr Lucia Gaskinsahern  . Constipation   . Depression 05/30/2017   with emotional eating  . Dysuria   . GERD (gastroesophageal reflux disease)   . History of acute pyelonephritis    09-27-2015:   11-04-2017  w/ sepsis due to obstruction from kidney stone  . History of kidney stones   . IC (interstitial cystitis)   . Interstitial cystitis   . Left ureteral stone   . OAB (overactive bladder)   . Pre-diabetes   . Urge urinary incontinence   . Urgency of urination   . Urinary reflux   . Vesicoureteral reflux     Past Surgical History:  Procedure Laterality Date  . CHOLECYSTECTOMY N/A 10/26/2017   Procedure: LAPAROSCOPIC CHOLECYSTECTOMY WITH INTRAOPERATIVE CHOLANGIOGRAM ERAS PATHWAY;  Surgeon: Harriette Bouillonornett, Thomas, MD;  [facility];  Service: General;  Laterality: N/A;  . CYSTOSCOPY W/ URETERAL STENT PLACEMENT Right 09/27/2015   Procedure: CYSTOSCOPY WITH RETROGRADE PYELOGRAM/URETERAL STENT PLACEMENT;  Surgeon: Hildred LaserBrian James Budzyn, MD;  [facility];  Service: Urology;  Laterality: Right;  . CYSTOSCOPY WITH RETROGRADE  PYELOGRAM, URETEROSCOPY AND STENT PLACEMENT Left 09/09/2014   Procedure: CYSTOSCOPY WITH LEFT RETROGRADE PYELOGRAM, URETEROSCOPY AND STONE EXTRACTION  DOUBLE J STENT PLACEMENT;  Surgeon: Jerilee FieldMatthew Eskridge, MD;  Location[facility];  Service: Urology;  Laterality: Left;  . CYSTOSCOPY WITH STENT PLACEMENT Left 11/04/2017   Procedure: CYSTOSCOPY WITH RETROGRADE AND LEFT STENT PLACEMENT;  Surgeon: Crista ElliotBell, Eugene D III, MD;  Location[facility];  Service: Urology;  Laterality: Left;  . CYSTOSCOPY/URETEROSCOPY/HOLMIUM LASER/STENT PLACEMENT Right 10/16/2015   Procedure: RIGHT URETEROSCOPY/HOLMIUM LASER/STENT PLACEMENT;  Surgeon: Jerilee FieldMatthew Eskridge, MD;  [facility] [place] [facility];  Service: Urology;  Laterality: Right;  . CYSTOSCOPY/URETEROSCOPY/HOLMIUM LASER/STENT PLACEMENT Left 11/21/2017   Procedure: CYSTOSCOPY/URETEROSCOPY/HOLMIUM LASER/STENT PLACEMENT;  Surgeon: Jerilee FieldEskridge, Matthew, MD;  [facility];  Service: Urology;  Laterality: Left;  ONLY NEEDS 60 MIN  . HOLMIUM LASER APPLICATION Left 09/09/2014   Procedure: HOLMIUM LASER APPLICATION;  Surgeon: Jerilee FieldMatthew Eskridge, MD;  [facility];  Service: Urology;  Laterality: Left;  . HOLMIUM LASER APPLICATION Right 10/16/2015   Procedure: HOLMIUM LASER APPLICATION;  Surgeon: Jerilee FieldMatthew Eskridge, MD;  [facility] [place];  Service: Urology;  Laterality: Right;  . STONE EXTRACTION WITH BASKET Right 10/16/2015   Procedure: STONE EXTRACTION WITH BASKET;  Surgeon: Jerilee FieldMatthew Eskridge, MD;  [facility] [place];  Service: Urology;  Laterality: Right;  . WISDOM TOOTH EXTRACTION  03/2014  There were no vitals filed for this visit.  Subjective Assessment - 01/14/19 0909    Subjective  Intercourse is less painful and less pain with sitting. Intercourse is 30% better and sitting is 30% better since using the vaginal moisturizer. I bought a cushion that is a donut cushion  and foot rest for desk. No change with emptying  bladder. Urgency is high.    Patient Stated Goals  less pain, be able to fully empty her bladder    Currently in Pain?  Yes    Pain Score  3     Pain Location  Abdomen    Pain Orientation  Mid;Lower    Pain Descriptors / Indicators  Burning;Sharp    Pain Type  Chronic pain    Pain Onset  More than a month ago    Pain Frequency  Intermittent    Aggravating Factors   sitting, urinating, intercourse with initial penetration    Pain Relieving Factors  uses a donut pillow to sit    Multiple Pain Sites  No                       [facility] Adult PT Treatment/Exercise - 01/14/19 0001      Neuro Re-ed    Neuro Re-ed Details   education on urge to void to reduce urgency to urniate and bulging the pelvic floor to elongate tissue and rleax the muscles, education on relaxation apps to calm the bladder      Manual Therapy   Manual Therapy  Myofascial release;Soft tissue mobilization;Internal Pelvic Floor    Soft tissue mobilization  circular massage to the abdomen to work on the elongation of tissue and improve colon movement    Myofascial Release  bladder mobility,     Internal Pelvic Floor  internal work to the introitus, levator ani and obturator internist             PT Education - 01/14/19 0948    Education Details  bulge of pelvic floor and urge to void, apps to use for relaxation    Person(s) Educated  Patient    Methods  Explanation;Demonstration;Handout    Comprehension  Verbalized understanding;Returned demonstration       PT Short Term Goals - 01/14/19 0957      PT SHORT TERM GOAL #1   Title  independent with initial HEP    Time  4    Period  Weeks    Status  Achieved    Target Date  12/20/18      PT SHORT TERM GOAL #2   Title  understand how to massage the pelvic floor to improve tissue mobility    Time  4    Period  Weeks    Status  On-going    Target Date  12/20/18      PT SHORT TERM GOAL #3   Title  pelvic floor strength is 2/5 due to improved  tissue mobilty    Baseline  1/5    Time  4    Period  Weeks    Status  On-going      PT SHORT TERM GOAL #4   Title  lower abdominal pain decreased >/= 2/10 due to improve tissue mobilty    Baseline  pain level 3/10    Time  4    Period  Weeks    Status  On-going    Target Date  12/20/18        PT  Long Term Goals - 11/22/18 0857      PT LONG TERM GOAL #1   Title  independent with HEP and understand how to progress herself    Baseline  not educated yet    Time  12    Period  Weeks    Status  New    Target Date  02/14/19      PT LONG TERM GOAL #2   Title  pain with penile penetration decreased >/= 0-1/10 due to improve tissue mobility    Baseline  pain level 3/10     Time  12    Period  Weeks    Status  New    Target Date  02/14/19      PT LONG TERM GOAL #3   Title  urinary leakage decreased to none to minimal with sneezng and coughing and not have to wear a pad, pelvic floor strength 3/5    Baseline  leakage with sneezng and coughing wearing a 1 pad per day; pelvic floor strength 1/5    Time  12    Period  Weeks    Status  New    Target Date  02/14/19      PT LONG TERM GOAL #4   Title  lower abdominal pain with sitting decreased >/= 0-1/10    Baseline  pain level 3/10    Time  12    Period  Weeks    Status  New    Target Date  02/14/19            Plan - 01/14/19 0912    Clinical Impression Statement  Patient has 30% less pain with intercourse and with sitting. Patient was able to sit on the floor with her legs crossed due to improved hip flexibility. Patient had fascial tightness in the lower abdomen and pelvic floor. Patient has difficulty with pelvic floor contraction due to muscle tightness. Patient will benefit from skilled therapy to reduce pain, urinary leakage,improve tissue mobility and strength.    Personal Factors and Comorbidities  Sex;Comorbidity 1    Comorbidities  interstitial cystitis    Examination-Activity Limitations  Toileting;Continence     Rehab Potential  Excellent    PT Frequency  1x / week    PT Duration  12 weeks    PT Treatment/Interventions  Biofeedback;Cryotherapy;Electrical Stimulation;Moist Heat;Ultrasound;Therapeutic activities;Therapeutic exercise;Neuromuscular re-education;Manual techniques;Patient/family education;Dry needling;Passive range of motion    PT Next Visit Plan  soft tissue work to abdomen and pelvic floor,  bulging of pelvic floor, check on leakage and urgency; education on pelvic floor massage    PT Home Exercise Plan  Access Code: [email or web address]    Consulted and Agree with Plan of Care  Patient       Patient will benefit from skilled therapeutic intervention in order to improve the following deficits and impairments:  Increased fascial restricitons, Pain, Decreased coordination, Decreased mobility, Increased muscle spasms, Decreased activity tolerance, Decreased endurance, Decreased range of motion, Decreased strength  Visit Diagnosis: 1. Other muscle spasm   2. Other lack of coordination   3. Muscle weakness (generalized)   4. Interstitial cystitis        Problem List Patient Active Problem List   Diagnosis Date Noted  . GERD without esophagitis 08/31/2018  . Wheezing 08/31/2018  . Depression, major, recurrent ([place]) 04/25/2018  . Generalized anxiety disorder 04/04/2018  . Pyelonephritis 11/04/2017  . Postoperative upper abdominal pain 10/31/2017  . Prediabetes 05/30/2017  . Vitamin D deficiency 05/30/2017  . Other  hyperlipidemia 05/30/2017  . Class 1 obesity with serious comorbidity and body mass index (BMI) of 31.0 to 31.9 in adult 05/30/2017  . Migraine without aura and without status migrainosus, not intractable 05/09/2017  . Other fatigue 03/13/2017  . Hyperglycemia 03/13/2017  . Class 1 obesity with serious comorbidity and body mass index (BMI) of 33.0 to 33.9 in adult 03/13/2017  . Overactive bladder 11/14/2015  . Syncope and collapse 09/27/2015  . Nephrolithiasis 09/27/2015  .  Acute pyelonephritis 09/27/2015  . IC (interstitial cystitis) 09/27/2015  . Leukocytosis   . Vesico-ureteral reflux 09/15/2014  . History of kidney stones 07/28/2014  . Syncope 03/20/2012    Eulis Fosterheryl , PT 01/14/19 9:59 AM   [facility] [address], [address] [place], [place], [address] Phone: [phone]   Fax:  [phone]  Name: Lauren CurlingRachael D Cardenas MRN: [id number] Date of Birth: 1986/06/13

## 2019-01-14 NOTE — Patient Instructions (Addendum)
Bear Down    Exhaling, bear down as if to have a bowel movement. Repeat _5__ times. Do __3_ times a day.  Copyright  VHI. All rights reserved.  Relaxation Exercises with the Urge to Void   When you experience an urge to void:  FIRST  Stop and stand very still    Sit down if you can    Don't move    You need to stay very still to maintain control  SECOND Squeeze your pelvic floor muscles 5 times, like a quick flick, to keep from leaking  THIRD Relax  Take a deep breath and then let it out  Try to make the urge go away by using relaxation and visualization techniques  FINALLY When you feel the urge go away somewhat, walk normally to the bathroom.   If the urge gets suddenly stronger on the way, you may stop again and relax to regain control.  headspace or calm app  [facility] [address], [place], [place] [address] Phone # [phone] Fax [phone]

## 2019-01-28 ENCOUNTER — Encounter: Payer: Self-pay | Admitting: Physical Therapy

## 2019-01-28 ENCOUNTER — Other Ambulatory Visit: Payer: Self-pay

## 2019-01-28 ENCOUNTER — Ambulatory Visit: Payer: Medicaid Other | Attending: Urology | Admitting: Physical Therapy

## 2019-01-28 DIAGNOSIS — R278 Other lack of coordination: Secondary | ICD-10-CM

## 2019-01-28 DIAGNOSIS — N3010 Interstitial cystitis (chronic) without hematuria: Secondary | ICD-10-CM | POA: Diagnosis not present

## 2019-01-28 DIAGNOSIS — M6281 Muscle weakness (generalized): Secondary | ICD-10-CM | POA: Insufficient documentation

## 2019-01-28 DIAGNOSIS — M62838 Other muscle spasm: Secondary | ICD-10-CM

## 2019-01-28 NOTE — Therapy (Signed)
[facility] [address], [address] [place], [place], [address] Phone: [phone]   Fax:  [phone]  Physical Therapy Treatment  Patient Details  Name: Lauren Cardenas Date of Birth: Carden[birth year] Referring Provider (PT): Vianne BullsBree Fleck, [facility]   Encounter Date: 01/28/2019  PT End of Session - 01/28/19 0856    Visit Number  4    Date for PT Re-Evaluation  02/14/19    Authorization Type  Medicaid    Authorization - Visit Number  4    Authorization - Number of Visits  4    PT Start Time  0815    PT Stop Time  0856    PT Time Calculation (min)  41 min    Activity Tolerance  Patient tolerated treatment well;No increased pain    Behavior During Therapy  WFL for tasks assessed/performed       Past Medical History:  Diagnosis Date  . Chronic migraine    neurologist-  dr Lucia Gaskinsahern  . Constipation   . Depression 05/30/2017   with emotional eating  . Dysuria   . GERD (gastroesophageal reflux disease)   . History of acute pyelonephritis    09-27-2015:   11-04-2017  w/ sepsis due to obstruction from kidney stone  . History of kidney stones   . IC (interstitial cystitis)   . Interstitial cystitis   . Left ureteral stone   . OAB (overactive bladder)   . Pre-diabetes   . Urge urinary incontinence   . Urgency of urination   . Urinary reflux   . Vesicoureteral reflux     Past Surgical History:  Procedure Laterality Date  . CHOLECYSTECTOMY N/A 10/26/2017   Procedure: LAPAROSCOPIC CHOLECYSTECTOMY WITH INTRAOPERATIVE CHOLANGIOGRAM ERAS PATHWAY;  Surgeon: Harriette Bouillonornett, Thomas, MD;  [facility]: [facility];  Service: General;  Laterality: N/A;  . CYSTOSCOPY W/ URETERAL STENT PLACEMENT Right 09/27/2015   Procedure: CYSTOSCOPY WITH RETROGRADE PYELOGRAM/URETERAL STENT PLACEMENT;  Surgeon: Hildred LaserBrian James Budzyn, MD;  Location: [facility];  Service: Urology;  Laterality: Right;  . CYSTOSCOPY WITH RETROGRADE PYELOGRAM, URETEROSCOPY AND STENT PLACEMENT  Left 09/09/2014   Procedure: CYSTOSCOPY WITH LEFT RETROGRADE PYELOGRAM, URETEROSCOPY AND STONE EXTRACTION  DOUBLE J STENT PLACEMENT;  Surgeon: Jerilee FieldMatthew Eskridge, MD;  [facility];  Service: Urology;  Laterality: Left;  . CYSTOSCOPY WITH STENT PLACEMENT Left 11/04/2017   Procedure: CYSTOSCOPY WITH RETROGRADE AND LEFT STENT PLACEMENT;  Surgeon: Crista ElliotBell, Eugene D III, MD;  [facility];  Service: Urology;  Laterality: Left;  . CYSTOSCOPY/URETEROSCOPY/HOLMIUM LASER/STENT PLACEMENT Right 10/16/2015   Procedure: RIGHT URETEROSCOPY/HOLMIUM LASER/STENT PLACEMENT;  Surgeon: Jerilee FieldMatthew Eskridge, MD;  [facility] [place];  Service: Urology;  Laterality: Right;  . CYSTOSCOPY/URETEROSCOPY/HOLMIUM LASER/STENT PLACEMENT Left 11/21/2017   Procedure: CYSTOSCOPY/URETEROSCOPY/HOLMIUM LASER/STENT PLACEMENT;  Surgeon: Jerilee FieldEskridge, Matthew, MD;  [facility] [place];  Service: Urology;  Laterality: Left;  ONLY NEEDS 60 MIN  . HOLMIUM LASER APPLICATION Left 09/09/2014   Procedure: HOLMIUM LASER APPLICATION;  Surgeon: Jerilee FieldMatthew Eskridge, MD;  [facility];  Service: Urology;  Laterality: Left;  . HOLMIUM LASER APPLICATION Right 10/16/2015   Procedure: HOLMIUM LASER APPLICATION;  Surgeon: Jerilee FieldMatthew Eskridge, MD;  [facility] [place];  Service: Urology;  Laterality: Right;  . STONE EXTRACTION WITH BASKET Right 10/16/2015   Procedure: STONE EXTRACTION WITH BASKET;  Surgeon: Jerilee FieldMatthew Eskridge, MD;  [facility] [place];  Service: Urology;  Laterality: Right;  . WISDOM TOOTH EXTRACTION  03/2014    There were no vitals filed  for this visit.  Subjective Assessment - 01/28/19 0820    Subjective  Urgency is better and is 10% better. Pain with intercourse 30% better. Pain with sitting is 30%. Patient is using a cushion and foot rest that helps her pain. Emptying the bladder is not easy yet.    Patient Stated Goals  less pain, be able to fully empty her bladder     Currently in Pain?  Yes    Pain Score  2     Pain Location  Abdomen    Pain Orientation  Mid;Lower    Pain Descriptors / Indicators  Burning;Sharp    Pain Type  Chronic pain    Pain Onset  More than a month ago    Pain Frequency  Intermittent    Aggravating Factors   sitting, urinating, intercourse with initial penetration    Pain Relieving Factors  uese a donut pillow to sit    Multiple Pain Sites  No         [facility] PT Assessment - 01/28/19 0001      Assessment   Medical Diagnosis  R10.2 Pelvic pain; R30.0 Dysuria    Referring Provider (PT)  Vianne BullsBree Fleck, [facility]    Onset Date/Surgical Date  04/27/18    Prior Therapy  none      Precautions   Precautions  None      Restrictions   Weight Bearing Restrictions  No      Home Environment   Living Environment  Private residence      Prior Function   Level of Independence  Independent    Vocation  Full time employment    Vocation Requirements  desk job with sitting    Leisure  no exercise      Cognition   Overall Cognitive Status  Within Functional Limits for tasks assessed      Posture/Postural Control   Posture/Postural Control  Postural limitations    Postural Limitations  Rounded Shoulders;Forward head;Decreased lumbar lordosis      ROM / Strength   AROM / PROM / Strength  AROM;PROM;Strength      AROM   Lumbar Extension  full    Lumbar - Right Side Bend  full    Lumbar - Left Side Bend  full      PROM   Overall PROM Comments  passive left hip flexion with pain 6/10  in left lower abdominal    Right Hip External Rotation   55    Left Hip External Rotation   50      Strength   Right Hip Extension  5/5    Right Hip External Rotation   5/5    Left Hip Extension  5/5      Palpation   Palpation comment  tenderness located left suprapubically, tightness in the diaphragm, right transverse abdonminus                Pelvic Floor Special Questions - 01/28/19 0001    Pelvic Floor Internal Exam  Patient confirms  identification and approves PT to assess the pelvic floor and treatment    Exam Type  Vaginal    Palpation  tenderness located on the bladder, urethra sphincter, bulbocavernosus, obturator internist, levator ani    Strength  weak squeeze, no lift        [facility] Adult PT Treatment/Exercise - 01/28/19 0001      Neuro Re-ed    Neuro Re-ed Details   diaphragmatic breathing to elongate the pelvic floor  and afterwards reduced the tenderness on palpatine, tactile cues to pelvic floor to contract holding for 5 seconds 10 times; instruction on urge to void and how to reduce while getting to the bathroom      Manual Therapy   Manual Therapy  Internal Pelvic Floor    Internal Pelvic Floor  elongation of the intoitus, obturator internist, levator ani, urethra sphincter               PT Short Term Goals - 01/28/19 0830      PT SHORT TERM GOAL #1   Title  independent with initial HEP    Time  4    Period  Weeks    Status  Achieved    Target Date  12/20/18      PT SHORT TERM GOAL #2   Title  understand how to massage the pelvic floor to improve tissue mobility    Time  4    Period  Weeks    Status  Achieved      PT SHORT TERM GOAL #3   Title  pelvic floor strength is 2/5 due to improved tissue mobilty    Period  Weeks    Status  Achieved    Target Date  12/20/18      PT SHORT TERM GOAL #4   Title  lower abdominal pain decreased >/= 2/10 due to improve tissue mobilty    Baseline  pain level 3/10    Time  4    Status  On-going    Target Date  12/20/18        PT Long Term Goals - 01/28/19 2951      PT LONG TERM GOAL #1   Title  independent with HEP and understand how to progress herself    Baseline  still learning and progressing her eduction    Time  12    Period  Weeks    Status  On-going      PT LONG TERM GOAL #2   Title  pain with penile penetration decreased >/= 0-1/10 due to improve tissue mobility    Baseline  pain level 5/10    Time  12    Period  Weeks     Status  On-going      PT LONG TERM GOAL #3   Title  urinary leakage decreased to none to minimal with sneezng and coughing and not have to wear a pad, pelvic floor strength 3/5    Baseline  leakage with sneezng and coughing wearing a 1 pad per day; pelvic floor strength 1/5; leakage is 30% better    Time  12    Period  Weeks    Status  On-going      PT LONG TERM GOAL #4   Title  lower abdominal pain with sitting decreased >/= 0-1/10    Baseline  pain level 3/10    Time  12    Period  Weeks    Status  On-going            Plan - 01/28/19 0856    Clinical Impression Statement  Patient has increased bilateral hip rotation due to increased flexibility. Patient has full lumbar ROM. Bilateral hip strength has increased to 5/5. When flexing the left hip fully the abdominal pain is 6/10. Abdominal strength is now 2/5. Pelvic floor strength is 2/5 now and she is able to feel the contraction. Pain with intercourse and sitting is 30% better. Patient urgency is 10% better.  Patient understands the importance of performing her perineal massage. Patient will benefit from skilled therapy to reduce pain, urinary leakage, improve tissu mobility and strenght.    Personal Factors and Comorbidities  Sex;Comorbidity 1    Comorbidities  interstitial cystitis    Examination-Activity Limitations  Toileting;Continence    Rehab Potential  Excellent    PT Frequency  1x / week    PT Duration  12 weeks    PT Treatment/Interventions  Biofeedback;Cryotherapy;Electrical Stimulation;Moist Heat;Ultrasound;Therapeutic activities;Therapeutic exercise;Neuromuscular re-education;Manual techniques;Patient/family education;Dry needling;Passive range of motion    PT Next Visit Plan  soft tissue work to abdomen and pelvic floor,   check on leakage and urgency; work on pelvic floor contraction    PT Home Exercise Plan  Access Code: [email or web address]    Recommended Other Services  sent Medicaid in    Consulted and Agree with Plan of  Care  Patient       Patient will benefit from skilled therapeutic intervention in order to improve the following deficits and impairments:  Increased fascial restricitons, Pain, Decreased coordination, Decreased mobility, Increased muscle spasms, Decreased activity tolerance, Decreased endurance, Decreased range of motion, Decreased strength  Visit Diagnosis: 1. Other muscle spasm   2. Other lack of coordination   3. Muscle weakness (generalized)   4. Interstitial cystitis        Problem List Patient Active Problem List   Diagnosis Date Noted  . GERD without esophagitis 08/31/2018  . Wheezing 08/31/2018  . Depression, major, recurrent (HCC) 04/25/2018  . Generalized anxiety disorder 04/04/2018  . Pyelonephritis 11/04/2017  . Postoperative upper abdominal pain 10/31/2017  . Prediabetes 05/30/2017  . Vitamin D deficiency 05/30/2017  . Other hyperlipidemia 05/30/2017  . Class 1 obesity with serious comorbidity and body mass index (BMI) of 31.0 to 31.9 in adult 05/30/2017  . Migraine without aura and without status migrainosus, not intractable 05/09/2017  . Other fatigue 03/13/2017  . Hyperglycemia 03/13/2017  . Class 1 obesity with serious comorbidity and body mass index (BMI) of 33.0 to 33.9 in adult 03/13/2017  . Overactive bladder 11/14/2015  . Syncope and collapse 09/27/2015  . Nephrolithiasis 09/27/2015  . Acute pyelonephritis 09/27/2015  . IC (interstitial cystitis) 09/27/2015  . Leukocytosis   . Vesico-ureteral reflux 09/15/2014  . History of kidney stones 07/28/2014  . Syncope 03/20/2012    Eulis Fosterheryl Jannatul Wojdyla, PT 01/28/19 9:01 AM   [facility] [address], [address] [place], [place], [address] Phone: [phone]   Fax:  [phone]  Name: Lauren Cardenas Date of Birth: May Cardenas, Lauren Cardenas

## 2019-01-28 NOTE — Patient Instructions (Addendum)
Sitting    Sit comfortably. Allow body's muscles to relax. Place hands on belly. Inhale slowly and deeply for _3_ seconds, so hands move out. Then take 3___ seconds to exhale. Feel the pelvic floor bulge to stretch.  Repeat __5_ times. Do _3__ times a day. Can still do in laying down.   Copyright  VHI. All rights reserved.   Slow Contraction: Gravity Eliminated (Supine)    Lie flat. Slowly squeeze pelvic floor for __5_ seconds. Rest for _10__ seconds. Repeat _5__ times. Do __3_ times a day.   Copyright  VHI. All rights reserved.   Continue with the perineal massage 3 times per week. Do not have to perform when on your cycle.  [facility] [address] [place], [place] [address] Phone # [phone] Fax [phone]

## 2019-02-06 ENCOUNTER — Ambulatory Visit: Payer: Medicaid Other | Admitting: Physical Therapy

## 2019-02-06 ENCOUNTER — Encounter: Payer: Self-pay | Admitting: Physical Therapy

## 2019-02-06 ENCOUNTER — Other Ambulatory Visit: Payer: Self-pay

## 2019-02-06 DIAGNOSIS — M6281 Muscle weakness (generalized): Secondary | ICD-10-CM | POA: Diagnosis not present

## 2019-02-06 DIAGNOSIS — R278 Other lack of coordination: Secondary | ICD-10-CM

## 2019-02-06 DIAGNOSIS — N3010 Interstitial cystitis (chronic) without hematuria: Secondary | ICD-10-CM

## 2019-02-06 DIAGNOSIS — M62838 Other muscle spasm: Secondary | ICD-10-CM | POA: Diagnosis not present

## 2019-02-06 NOTE — Patient Instructions (Signed)
Access Code: [email or web address]  URL: [email or web address]  Date: 02/06/2019  Prepared by: Earlie Counts   Exercises Seated Piriformis Stretch with Trunk Bend - 2 reps - 1 sets - 30sec hold - 1x daily - 7x weekly Seated Hamstring Stretch - 2 reps - 1 sets - 30 sec hold - 1x daily - 7x weekly Quadruped Cat Camel - 10 reps - 1 sets - 1x daily - 7x weekly Child's Pose Stretch - 1 reps - 1 sets - 1 min hold - 1x daily - 7x weekly Childs Pose Side Bend - 2 reps - 1 sets - 30 sec hold - 1x daily - 7x weekly Prone Press Up - 2 reps - 1 sets - 5 sec hold - 1x daily - 7x weekly Supine Lower Trunk Rotation - 2 reps - 1 sets - 15 sec hold - 1x daily - 7x weekly Supine Pelvic Floor Stretch - 1 reps - 1 sets - 30 sec hold - 1x daily - 7x weekly Seated Hip Flexor Stretch - 2 reps - 1 sets - 30 sec hold - 1x daily - 7x weekly Bird Dog - 10 reps - 1 sets - 1x daily - 7x weekly Supine Bridge - 10 reps - 1 sets - 1x daily - 7x weekly Bent Knee Fallouts - 10 reps - 1 sets - 1x daily - 7x weekly [facility] [address] [place], [place] [address] Phone # [phone] Fax [phone]

## 2019-02-06 NOTE — Therapy (Signed)
[facility] [address] [place], [place], [address] Phone: [phone]   Fax:  [phone]  Physical Therapy Treatment  Patient Details  Name: Lauren Cardenas MRN: [id number] Date of Birth: 1986-05-25 Referring Provider (PT): Azucena Fallen, [facility]   Encounter Date: 02/06/2019  PT End of Session - 02/06/19 0821    Visit Number  5    Date for PT Re-Evaluation  02/14/19    Authorization Type  Medicaid    Authorization Time Period  02/04/19-04/28/2019    Authorization - Visit Number  1    Authorization - Number of Visits  12    PT Start Time  0815    PT Stop Time  2831    PT Time Calculation (min)  40 min    Activity Tolerance  Patient tolerated treatment well;No increased pain    Behavior During Therapy  WFL for tasks assessed/performed       Past Medical History:  Diagnosis Date  . Chronic migraine    neurologist-  dr Jaynee Eagles  . Constipation   . Depression 05/30/2017   with emotional eating  . Dysuria   . GERD (gastroesophageal reflux disease)   . History of acute pyelonephritis    09-27-2015:   11-04-2017  w/ sepsis due to obstruction from kidney stone  . History of kidney stones   . IC (interstitial cystitis)   . Interstitial cystitis   . Left ureteral stone   . OAB (overactive bladder)   . Pre-diabetes   . Urge urinary incontinence   . Urgency of urination   . Urinary reflux   . Vesicoureteral reflux     Past Surgical History:  Procedure Laterality Date  . CHOLECYSTECTOMY N/A 10/26/2017   Procedure: LAPAROSCOPIC CHOLECYSTECTOMY WITH INTRAOPERATIVE CHOLANGIOGRAM ERAS PATHWAY;  Surgeon: Erroll Luna, MD;  Location: [facility];  Service: General;  Laterality: N/A;  . CYSTOSCOPY W/ URETERAL STENT PLACEMENT Right 09/27/2015   Procedure: CYSTOSCOPY WITH RETROGRADE PYELOGRAM/URETERAL STENT PLACEMENT;  Surgeon: Nickie Retort, MD;  [facility];  Service: Urology;  Laterality: Right;  . CYSTOSCOPY WITH  RETROGRADE PYELOGRAM, URETEROSCOPY AND STENT PLACEMENT Left 09/09/2014   Procedure: CYSTOSCOPY WITH LEFT RETROGRADE PYELOGRAM, URETEROSCOPY AND STONE EXTRACTION  DOUBLE J STENT PLACEMENT;  Surgeon: Festus Aloe, MD;  [facility];  Service: Urology;  Laterality: Left;  . CYSTOSCOPY WITH STENT PLACEMENT Left 11/04/2017   Procedure: CYSTOSCOPY WITH RETROGRADE AND LEFT STENT PLACEMENT;  Surgeon: Lucas Mallow, MD;  [facility];  Service: Urology;  Laterality: Left;  . CYSTOSCOPY/URETEROSCOPY/HOLMIUM LASER/STENT PLACEMENT Right 10/16/2015   Procedure: RIGHT URETEROSCOPY/HOLMIUM LASER/STENT PLACEMENT;  Surgeon: Festus Aloe, MD;  Location: [facility];  Service: Urology;  Laterality: Right;  . CYSTOSCOPY/URETEROSCOPY/HOLMIUM LASER/STENT PLACEMENT Left 11/21/2017   Procedure: CYSTOSCOPY/URETEROSCOPY/HOLMIUM LASER/STENT PLACEMENT;  Surgeon: Festus Aloe, MD;  Location: [facility];  Service: Urology;  Laterality: Left;  ONLY NEEDS 60 MIN  . HOLMIUM LASER APPLICATION Left 11/11/6158   Procedure: HOLMIUM LASER APPLICATION;  Surgeon: Festus Aloe, MD;  [facility];  Service: Urology;  Laterality: Left;  . HOLMIUM LASER APPLICATION Right 7/37/1062   Procedure: HOLMIUM LASER APPLICATION;  Surgeon: Festus Aloe, MD;  Location: [facility];  Service: Urology;  Laterality: Right;  . STONE EXTRACTION WITH BASKET Right 10/16/2015   Procedure: STONE EXTRACTION WITH BASKET;  Surgeon: Festus Aloe, MD;  Location: [facility];  Service: Urology;  Laterality: Right;  . WISDOM TOOTH EXTRACTION  03/2014  There were no vitals filed for this visit.  Subjective Assessment - 02/06/19 0826    Subjective  urgency is 10% better. abdominal pain is 3-4/10. Sitting is 30% better. Emptying bladder is not as easy.    Patient Stated Goals  less pain, be able to fully empty her bladder    Currently in Pain?  Yes    Pain Score  4      Pain Location  Abdomen    Pain Orientation  Mid;Lower    Pain Descriptors / Indicators  Burning;Sharp    Pain Type  Chronic pain    Pain Onset  More than a month ago    Pain Frequency  Intermittent    Aggravating Factors   sitting, urinating, intercourse with initial penetration    Pain Relieving Factors  uses a donut pillow to sit    Multiple Pain Sites  No                    Pelvic Floor Special Questions - 02/06/19 0001    Pelvic Floor Internal Exam  Patient confirms identification and approves PT to assess the pelvic floor and treatment    Exam Type  Vaginal    Strength  weak squeeze, no lift        [facility] Adult PT Treatment/Exercise - 02/06/19 0001      Lumbar Exercises: Stretches   Quadruped Mid Back Stretch  1 rep;60 seconds   with diaphragmatic breathing     Lumbar Exercises: Aerobic   Nustep  7 minutes; level 2 while assessing patient      Lumbar Exercises: Supine   Clam  20 reps;1 second   pelvic floor contraction   Clam Limitations  more difficulty with control on left leg    Bridge  10 reps;1 second      Lumbar Exercises: Quadruped   Madcat/Old Horse  15 reps    Madcat/Old Horse Limitations  VC on head movement and correct breathing    Opposite Arm/Leg Raise  Right arm/Left leg;Left arm/Right leg;10 reps    Opposite Arm/Leg Raise Limitations  tactile cues to not dip the pelvis      Manual Therapy   Manual Therapy  Internal Pelvic Floor    Internal Pelvic Floor  elongation of the neck of the bladder, urethra spincter along the intoitus, right sacral tuberous ligament             PT Education - 02/06/19 0841    Education Details  Access Code: [email or web address]    Methods  Explanation;Demonstration;Handout    Comprehension  Verbalized understanding;Returned demonstration       PT Short Term Goals - 02/06/19 0824      PT SHORT TERM GOAL #4   Title  lower abdominal pain decreased >/= 2/10 due to improve tissue mobilty    Baseline  pain level  3-4/10    Time  4    Period  Weeks    Status  On-going    Target Date  12/20/18        PT Long Term Goals - 01/28/19 16100823      PT LONG TERM GOAL #1   Title  independent with HEP and understand how to progress herself    Baseline  still learning and progressing her eduction    Time  12    Period  Weeks    Status  On-going      PT LONG TERM GOAL #2   Title  pain with  penile penetration decreased >/= 0-1/10 due to improve tissue mobility    Baseline  pain level 5/10    Time  12    Period  Weeks    Status  On-going      PT LONG TERM GOAL #3   Title  urinary leakage decreased to none to minimal with sneezng and coughing and not have to wear a pad, pelvic floor strength 3/5    Baseline  leakage with sneezng and coughing wearing a 1 pad per day; pelvic floor strength 1/5; leakage is 30% better    Time  12    Period  Weeks    Status  On-going      PT LONG TERM GOAL #4   Title  lower abdominal pain with sitting decreased >/= 0-1/10    Baseline  pain level 3/10    Time  12    Period  Weeks    Status  On-going            Plan - 02/06/19 [id number]    Clinical Impression Statement  Patient has more difficulty performing the clam on the left. Patient needs verbal cues to not drop her hip with quadruped lift opposite extremity. Patient reports her pain level decreased to a 2/10 from 4/10 after therapy. Patient has information on purchasing a wand to do internal pelvic floor work. Patient reports less pain with intercourse. Patient will benefit from skilled therapy to reduce pain, urinary leakage, improve tissue mobility and strength.    Personal Factors and Comorbidities  Sex;Comorbidity 1    Comorbidities  interstitial cystitis    Examination-Activity Limitations  Toileting;Continence    Rehab Potential  Excellent    PT Frequency  1x / week    PT Duration  12 weeks    PT Treatment/Interventions  Biofeedback;Cryotherapy;Electrical Stimulation;Moist Heat;Ultrasound;Therapeutic  activities;Therapeutic exercise;Neuromuscular re-education;Manual techniques;Patient/family education;Dry needling;Passive range of motion    PT Next Visit Plan  soft tissue work to abdomen and pelvic floor,   resisted hip flexion for abdominal contraction; work on pelvic floor contraction    PT Home Exercise Plan  Access Code: [email or web address]    Recommended Other Services  medicaid accepted    Consulted and Agree with Plan of Care  Patient       Patient will benefit from skilled therapeutic intervention in order to improve the following deficits and impairments:  Increased fascial restricitons, Pain, Decreased coordination, Decreased mobility, Increased muscle spasms, Decreased activity tolerance, Decreased endurance, Decreased range of motion, Decreased strength  Visit Diagnosis: 1. Other muscle spasm   2. Other lack of coordination   3. Muscle weakness (generalized)   4. Interstitial cystitis        Problem List Patient Active Problem List   Diagnosis Date Noted  . GERD without esophagitis 08/31/2018  . Wheezing 08/31/2018  . Depression, major, recurrent (HCC) 04/25/2018  . Generalized anxiety disorder 04/04/2018  . Pyelonephritis 11/04/2017  . Postoperative upper abdominal pain 10/31/2017  . Prediabetes 05/30/2017  . Vitamin D deficiency 05/30/2017  . Other hyperlipidemia 05/30/2017  . Class 1 obesity with serious comorbidity and body mass index (BMI) of 31.0 to 31.9 in adult 05/30/2017  . Migraine without aura and without status migrainosus, not intractable 05/09/2017  . Other fatigue 03/13/2017  . Hyperglycemia 03/13/2017  . Class 1 obesity with serious comorbidity and body mass index (BMI) of 33.0 to 33.9 in adult 03/13/2017  . Overactive bladder 11/14/2015  . Syncope and collapse 09/27/2015  . Nephrolithiasis 09/27/2015  . Acute  pyelonephritis 09/27/2015  . IC (interstitial cystitis) 09/27/2015  . Leukocytosis   . Vesico-ureteral reflux 09/15/2014  . History of kidney  stones 07/28/2014  . Syncope 03/20/2012    Eulis Fosterheryl Ziare Orrick, PT 02/06/19 9:01 AM   [facility] [address], [address] [place], [place], [address] Phone: [phone]   Fax:  [phone]  Name: Lauren CurlingRachael D Cardenas MRN: [id number] Date of Birth: 1985-10-13

## 2019-02-11 ENCOUNTER — Other Ambulatory Visit: Payer: Self-pay

## 2019-02-11 ENCOUNTER — Encounter: Payer: Self-pay | Admitting: Physical Therapy

## 2019-02-11 ENCOUNTER — Ambulatory Visit: Payer: Medicaid Other | Admitting: Physical Therapy

## 2019-02-11 DIAGNOSIS — N3010 Interstitial cystitis (chronic) without hematuria: Secondary | ICD-10-CM

## 2019-02-11 DIAGNOSIS — M62838 Other muscle spasm: Secondary | ICD-10-CM | POA: Diagnosis not present

## 2019-02-11 DIAGNOSIS — M6281 Muscle weakness (generalized): Secondary | ICD-10-CM | POA: Diagnosis not present

## 2019-02-11 DIAGNOSIS — R278 Other lack of coordination: Secondary | ICD-10-CM

## 2019-02-11 NOTE — Therapy (Signed)
[facility] [address], [place], [place], [address] Phone: [phone]   Fax:  [phone]  Physical Therapy Treatment  Patient Details  Name: Lauren Cardenas MRN: [id number] Date of Birth: [birth year] Referring Provider (PT): Azucena Fallen, [facility]   Encounter Date: 02/11/2019  PT End of Session - 02/11/19 0855    Visit Number  6    Date for PT Re-Evaluation  05/06/19    Authorization Type  Medicaid    Authorization Time Period  02/04/19-04/28/2019    Authorization - Visit Number  2    Authorization - Number of Visits  12    PT Start Time  0815    PT Stop Time  2426    PT Time Calculation (min)  43 min    Activity Tolerance  Patient tolerated treatment well;No increased pain    Behavior During Therapy  WFL for tasks assessed/performed       Past Medical History:  Diagnosis Date  . Chronic migraine    neurologist-  dr Jaynee Eagles  . Constipation   . Depression 05/30/2017   with emotional eating  . Dysuria   . GERD (gastroesophageal reflux disease)   . History of acute pyelonephritis    09-27-2015:   11-04-2017  w/ sepsis due to obstruction from kidney stone  . History of kidney stones   . IC (interstitial cystitis)   . Interstitial cystitis   . Left ureteral stone   . OAB (overactive bladder)   . Pre-diabetes   . Urge urinary incontinence   . Urgency of urination   . Urinary reflux   . Vesicoureteral reflux     Past Surgical History:  Procedure Laterality Date  . CHOLECYSTECTOMY N/A 10/26/2017   Procedure: LAPAROSCOPIC CHOLECYSTECTOMY WITH INTRAOPERATIVE CHOLANGIOGRAM ERAS PATHWAY;  Surgeon: Erroll Luna, MD;  Location: [place];  Service: General;  Laterality: N/A;  . CYSTOSCOPY W/ URETERAL STENT PLACEMENT Right 09/27/2015   Procedure: CYSTOSCOPY WITH RETROGRADE PYELOGRAM/URETERAL STENT PLACEMENT;  Surgeon: Nickie Retort, MD;  [facility];  Service: Urology;  Laterality: Right;  . CYSTOSCOPY WITH  RETROGRADE PYELOGRAM, URETEROSCOPY AND STENT PLACEMENT Left 09/09/2014   Procedure: CYSTOSCOPY WITH LEFT RETROGRADE PYELOGRAM, URETEROSCOPY AND STONE EXTRACTION  DOUBLE J STENT PLACEMENT;  Surgeon: Festus Aloe, MD;  [facility];  Service: Urology;  Laterality: Left;  . CYSTOSCOPY WITH STENT PLACEMENT Left 11/04/2017   Procedure: CYSTOSCOPY WITH RETROGRADE AND LEFT STENT PLACEMENT;  Surgeon: Lucas Mallow, MD;  [facility];  Service: Urology;  Laterality: Left;  . CYSTOSCOPY/URETEROSCOPY/HOLMIUM LASER/STENT PLACEMENT Right 10/16/2015   Procedure: RIGHT URETEROSCOPY/HOLMIUM LASER/STENT PLACEMENT;  Surgeon: Festus Aloe, MD;  Location: [facility];  Service: Urology;  Laterality: Right;  . CYSTOSCOPY/URETEROSCOPY/HOLMIUM LASER/STENT PLACEMENT Left 11/21/2017   Procedure: CYSTOSCOPY/URETEROSCOPY/HOLMIUM LASER/STENT PLACEMENT;  Surgeon: Festus Aloe, MD;  Location: [facility];  Service: Urology;  Laterality: Left;  ONLY NEEDS 60 MIN  . HOLMIUM LASER APPLICATION Left 8/34/1962   Procedure: HOLMIUM LASER APPLICATION;  Surgeon: Festus Aloe, MD;  [facility];  Service: Urology;  Laterality: Left;  . HOLMIUM LASER APPLICATION Right 2/29/7989   Procedure: HOLMIUM LASER APPLICATION;  Surgeon: Festus Aloe, MD;  Location: [facility]  Service: Urology;  Laterality: Right;  . STONE EXTRACTION WITH BASKET Right 10/16/2015   Procedure: STONE EXTRACTION WITH BASKET;  Surgeon: Festus Aloe, MD;  Location: [facility];  Service: Urology;  Laterality: Right;  . WISDOM TOOTH EXTRACTION  03/2014  There were no vitals filed for this visit.  Subjective Assessment - 02/11/19 0820    Subjective  I have been having more abdominal pain since last visit.    Patient Stated Goals  less pain, be able to fully empty her bladder    Currently in Pain?  Yes    Pain Score  4     Pain Location  Abdomen    Pain Orientation   Mid;Lower    Pain Descriptors / Indicators  Sore    Pain Type  Chronic pain    Pain Onset  More than a month ago    Pain Frequency  Intermittent    Aggravating Factors   sitting, urinating, intercourse with intial penetration    Pain Relieving Factors  uses a donut pillow to sit    Multiple Pain Sites  No         [facility] PT Assessment - 02/11/19 0001      Assessment   Medical Diagnosis  R10.2 Pelvic pain; R30.0 Dysuria    Referring Provider (PT)  Vianne BullsBree Fleck, [facility]    Onset Date/Surgical Date  04/27/18    Prior Therapy  none      Precautions   Precautions  None      Restrictions   Weight Bearing Restrictions  No      Home Environment   Living Environment  Private residence      Prior Function   Level of Independence  Independent    Vocation  Full time employment    Vocation Requirements  desk job with sitting    Leisure  no exercise      Cognition   Overall Cognitive Status  Within Functional Limits for tasks assessed      Posture/Postural Control   Posture/Postural Control  Postural limitations    Postural Limitations  Rounded Shoulders;Forward head;Decreased lumbar lordosis      AROM   Lumbar Extension  full    Lumbar - Right Side Bend  full    Lumbar - Left Side Bend  full      PROM   Overall PROM Comments  passive left hip flexion with pain 6/10  in left lower abdominal    Right Hip External Rotation   55    Left Hip External Rotation   50      Strength   Right Hip Extension  5/5    Right Hip External Rotation   5/5    Left Hip Extension  5/5      Palpation   Palpation comment  tenderness located left suprapubically, tightness in the diaphragm, right transverse abdonminus                Pelvic Floor Special Questions - 02/11/19 0001    Urinary Leakage  Yes    Activities that cause leaking  Coughing;Sneezing    Urinary urgency  Yes    Urinary frequency  2-3 hours    Fecal incontinence  No    Pelvic Floor Internal Exam  Patient confirms  identification and approves PT to assess the pelvic floor and treatment    Exam Type  Vaginal    Palpation  less tenderness located on the bladder, urethra sphincter, bulbocavernosus, obturator internist, levator ani    Strength  weak squeeze, no lift        [facility] Adult PT Treatment/Exercise - 02/11/19 0001      Lumbar Exercises: Aerobic   Nustep  6 minutes; level 3 while assessing patient  Lumbar Exercises: Supine   Isometric Hip Flexion  10 reps;5 seconds   each leg   Isometric Hip Flexion Limitations  then do diagonal to contract the obliques      Lumbar Exercises: Sidelying   Clam  Right;Left;10 reps;1 second    Clam Limitations  tactile cues to tighten the abdomen    Other Sidelying Lumbar Exercises  ball squeeze 5 sec with pelvic floor contraction with VC to breath an ddoes on both sides      Lumbar Exercises: Quadruped   Opposite Arm/Leg Raise  Right arm/Left leg;Left arm/Right leg;10 reps    Opposite Arm/Leg Raise Limitations  tactile cues to not dip the pelvis      Manual Therapy   Manual Therapy  Internal Pelvic Floor    Internal Pelvic Floor  elongation of the neck of the bladder, urethra spincter along the intoitus,               PT Short Term Goals - 02/11/19 0835      PT SHORT TERM GOAL #4   Title  lower abdominal pain decreased >/= 2/10 due to improve tissue mobilty    Baseline  pain level 3-4/10 due to increase abdominal exercise    Time  4    Period  Weeks    Status  On-going        PT Long Term Goals - 01/28/19 [id number]      PT LONG TERM GOAL #1   Title  independent with HEP and understand how to progress herself    Baseline  still learning and progressing her eduction    Time  12    Period  Weeks    Status  On-going      PT LONG TERM GOAL #2   Title  pain with penile penetration decreased >/= 0-1/10 due to improve tissue mobility    Baseline  pain level 5/10    Time  12    Period  Weeks    Status  On-going      PT LONG TERM GOAL  #3   Title  urinary leakage decreased to none to minimal with sneezng and coughing and not have to wear a pad, pelvic floor strength 3/5    Baseline  leakage with sneezng and coughing wearing a 1 pad per day; pelvic floor strength 1/5; leakage is 30% better    Time  12    Period  Weeks    Status  On-going      PT LONG TERM GOAL #4   Title  lower abdominal pain with sitting decreased >/= 0-1/10    Baseline  pain level 3/10    Time  12    Period  Weeks    Status  On-going            Plan - 02/11/19 [id number]    Clinical Impression Statement  Patient pelvic floor strength is now 2/5. Patient has abdominal soreness due to using her abdominals more. Patient has difficulty with emptyng her bladder that she has to have several drops of urine after she urintes. Patient pain with sitting is 30% better, urgency is 10% better, and abdominal pain is 10% better. Patient is working on her core strength with VC to engage the abdominals and contract the pelvic floor. Patient will benefit from skilled therapy to reduce pain, urinary leakage, improve tissue mobility and strength    Personal Factors and Comorbidities  Sex;Comorbidity 1    Comorbidities  interstitial cystitis  Examination-Activity Limitations  Toileting;Continence    Rehab Potential  Excellent    PT Frequency  1x / week    PT Duration  12 weeks    PT Treatment/Interventions  Biofeedback;Cryotherapy;Electrical Stimulation;Moist Heat;Ultrasound;Therapeutic activities;Therapeutic exercise;Neuromuscular re-education;Manual techniques;Patient/family education;Dry needling;Passive range of motion    PT Next Visit Plan  soft tissue work to abdomen and pelvic floor,   resisted hip flexion for abdominal contraction for HEP; work on pelvic floor contraction    PT Home Exercise Plan  Access Code: [email or web address]    Recommended Other Services  sent MD renewal on 8/17    Consulted and Agree with Plan of Care  Patient       Patient will benefit from  skilled therapeutic intervention in order to improve the following deficits and impairments:  Increased fascial restricitons, Pain, Decreased coordination, Decreased mobility, Increased muscle spasms, Decreased activity tolerance, Decreased endurance, Decreased range of motion, Decreased strength  Visit Diagnosis: 1. Other muscle spasm   2. Other lack of coordination   3. Muscle weakness (generalized)   4. Interstitial cystitis        Problem List Patient Active Problem List   Diagnosis Date Noted  . GERD without esophagitis 08/31/2018  . Wheezing 08/31/2018  . Depression, major, recurrent (HCC) 04/25/2018  . Generalized anxiety disorder 04/04/2018  . Pyelonephritis 11/04/2017  . Postoperative upper abdominal pain 10/31/2017  . Prediabetes 05/30/2017  . Vitamin D deficiency 05/30/2017  . Other hyperlipidemia 05/30/2017  . Class 1 obesity with serious comorbidity and body mass index (BMI) of 31.0 to 31.9 in adult 05/30/2017  . Migraine without aura and without status migrainosus, not intractable 05/09/2017  . Other fatigue 03/13/2017  . Hyperglycemia 03/13/2017  . Class 1 obesity with serious comorbidity and body mass index (BMI) of 33.0 to 33.9 in adult 03/13/2017  . Overactive bladder 11/14/2015  . Syncope and collapse 09/27/2015  . Nephrolithiasis 09/27/2015  . Acute pyelonephritis 09/27/2015  . IC (interstitial cystitis) 09/27/2015  . Leukocytosis   . Vesico-ureteral reflux 09/15/2014  . History of kidney stones 07/28/2014  . Syncope 03/20/2012    Eulis Fosterheryl Ajane Novella, PT 02/11/19 9:05 AM   [facility] [address], [address] [place], [place], [address] Phone: [phone]   Fax:  [phone]  Name: Lauren Cardenas MRN: [id number] Date of Birth: 1986/03/05

## 2019-02-20 ENCOUNTER — Other Ambulatory Visit: Payer: Self-pay

## 2019-02-20 ENCOUNTER — Encounter: Payer: Self-pay | Admitting: Physical Therapy

## 2019-02-20 ENCOUNTER — Ambulatory Visit: Payer: Medicaid Other | Admitting: Physical Therapy

## 2019-02-20 DIAGNOSIS — N3010 Interstitial cystitis (chronic) without hematuria: Secondary | ICD-10-CM

## 2019-02-20 DIAGNOSIS — M6281 Muscle weakness (generalized): Secondary | ICD-10-CM | POA: Diagnosis not present

## 2019-02-20 DIAGNOSIS — M62838 Other muscle spasm: Secondary | ICD-10-CM | POA: Diagnosis not present

## 2019-02-20 DIAGNOSIS — R278 Other lack of coordination: Secondary | ICD-10-CM

## 2019-02-20 NOTE — Therapy (Signed)
[facility] [address], [address] [place], [place], [address] Phone: [phone]   Fax:  [phone]  Physical Therapy Treatment  Patient Details  Name: Lauren CurlingRachael D Cardenas MRN: [id number] Date of Birth: 06-06-1986 Referring Provider (PT): Vianne BullsBree Fleck, [facility]   Encounter Date: 02/20/2019  PT End of Session - 02/20/19 [id number]    Visit Number  7    Date for PT Re-Evaluation  05/06/19    Authorization Type  Medicaid    Authorization Time Period  02/04/19-04/28/2019    Authorization - Visit Number  3    Authorization - Number of Visits  12    PT Start Time  0815    PT Stop Time  0855    PT Time Calculation (min)  40 min    Activity Tolerance  Patient tolerated treatment well;No increased pain    Behavior During Therapy  WFL for tasks assessed/performed       Past Medical History:  Diagnosis Date  . Chronic migraine    neurologist-  dr Lucia Gaskinsahern  . Constipation   . Depression 05/30/2017   with emotional eating  . Dysuria   . GERD (gastroesophageal reflux disease)   . History of acute pyelonephritis    09-27-2015:   11-04-2017  w/ sepsis due to obstruction from kidney stone  . History of kidney stones   . IC (interstitial cystitis)   . Interstitial cystitis   . Left ureteral stone   . OAB (overactive bladder)   . Pre-diabetes   . Urge urinary incontinence   . Urgency of urination   . Urinary reflux   . Vesicoureteral reflux     Past Surgical History:  Procedure Laterality Date  . CHOLECYSTECTOMY N/A 10/26/2017   Procedure: LAPAROSCOPIC CHOLECYSTECTOMY WITH INTRAOPERATIVE CHOLANGIOGRAM ERAS PATHWAY;  Surgeon: Harriette Bouillonornett, Thomas, MD;  [facility];  Service: General;  Laterality: N/A;  . CYSTOSCOPY W/ URETERAL STENT PLACEMENT Right 09/27/2015   Procedure: CYSTOSCOPY WITH RETROGRADE PYELOGRAM/URETERAL STENT PLACEMENT;  Surgeon: Hildred LaserBrian James Budzyn, MD;  [facility];  Service: Urology;  Laterality: Right;  . CYSTOSCOPY WITH  RETROGRADE PYELOGRAM, URETEROSCOPY AND STENT PLACEMENT Left 09/09/2014   Procedure: CYSTOSCOPY WITH LEFT RETROGRADE PYELOGRAM, URETEROSCOPY AND STONE EXTRACTION  DOUBLE J STENT PLACEMENT;  Surgeon: Jerilee FieldMatthew Eskridge, MD;  [facility];  Service: Urology;  Laterality: Left;  . CYSTOSCOPY WITH STENT PLACEMENT Left 11/04/2017   Procedure: CYSTOSCOPY WITH RETROGRADE AND LEFT STENT PLACEMENT;  Surgeon: Crista ElliotBell, Eugene D III, MD;  [facility];  Service: Urology;  Laterality: Left;  . CYSTOSCOPY/URETEROSCOPY/HOLMIUM LASER/STENT PLACEMENT Right 10/16/2015   Procedure: RIGHT URETEROSCOPY/HOLMIUM LASER/STENT PLACEMENT;  Surgeon: Jerilee FieldMatthew Eskridge, MD;  [facility];  Service: Urology;  Laterality: Right;  . CYSTOSCOPY/URETEROSCOPY/HOLMIUM LASER/STENT PLACEMENT Left 11/21/2017   Procedure: CYSTOSCOPY/URETEROSCOPY/HOLMIUM LASER/STENT PLACEMENT;  Surgeon: Jerilee FieldEskridge, Matthew, MD;  [facility];  Service: Urology;  Laterality: Left;  ONLY NEEDS 60 MIN  . HOLMIUM LASER APPLICATION Left 09/09/2014   Procedure: HOLMIUM LASER APPLICATION;  Surgeon: Jerilee FieldMatthew Eskridge, MD;  [facility];  Service: Urology;  Laterality: Left;  . HOLMIUM LASER APPLICATION Right 10/16/2015   Procedure: HOLMIUM LASER APPLICATION;  Surgeon: Jerilee FieldMatthew Eskridge, MD;  [facility];  Service: Urology;  Laterality: Right;  . STONE EXTRACTION WITH BASKET Right 10/16/2015   Procedure: STONE EXTRACTION WITH BASKET;  Surgeon: Jerilee FieldMatthew Eskridge, MD;  [facility];  Service: Urology;  Laterality: Right;  . WISDOM TOOTH EXTRACTION  03/2014  There were no vitals filed for this visit.  Subjective Assessment - 02/20/19 0824    Subjective  Abdominal pain is 30% better and not noticing it. Still has a little trouble to empty bladder is 10% better. The desk stretches has been helping.    Patient Stated Goals  less pain, be able to fully empty her bladder     Multiple Pain Sites  No                    Pelvic Floor Special Questions - 02/20/19 0001    Pelvic Floor Internal Exam  Patient confirms identification and approves PT to assess the pelvic floor and treatment    Exam Type  Vaginal    Palpation  no tenderness today    Strength  weak squeeze, no lift        [facility] Adult PT Treatment/Exercise - 02/20/19 0001      Lumbar Exercises: Stretches   Press Ups  5 reps   with overpressure to lumbar   Press Ups Limitations  5 times with Tactile cues to extend the T5-T10    Piriformis Stretch  Right;Left;2 reps;30 seconds    Piriformis Stretch Limitations  while using adday to the piriformis and gluteals                     Other Lumbar Stretch Exercise  hands and knees rocking back and forth with head movement.     Other Lumbar Stretch Exercise  childs pose with using assistive device to massaage the lumbar paraspinals; sitting hip adductor stretch with diaphgramatic breathing      Lumbar Exercises: Aerobic   Nustep  7 minutes; level 3 while assessing patient      Manual Therapy   Manual Therapy  Joint mobilization;Internal Pelvic Floor    Joint Mobilization  P-A and rotational mobilization to T5-L5 to improve lumbar and thoracic extension.     Internal Pelvic Floor  elongation of the neck of the bladder, urethra spincter along the intoitus,               PT Short Term Goals - 02/20/19 0823      PT SHORT TERM GOAL #1   Title  independent with initial HEP    Time  4    Period  Weeks    Status  Achieved    Target Date  12/20/18      PT SHORT TERM GOAL #2   Title  understand how to massage the pelvic floor to improve tissue mobility    Time  4    Period  Weeks    Status  Achieved    Target Date  12/20/18      PT SHORT TERM GOAL #3   Title  pelvic floor strength is 2/5 due to improved tissue mobilty    Time  4    Period  Weeks    Status  Achieved      PT SHORT TERM GOAL #4   Title  lower abdominal pain  decreased >/= 2/10 due to improve tissue mobilty    Time  4    Period  Weeks    Status  Achieved    Target Date  12/20/18        PT Long Term Goals - 01/28/19 [id number]      PT LONG TERM GOAL #1   Title  independent with HEP and understand how to progress herself    Baseline  still  learning and progressing her eduction    Time  12    Period  Weeks    Status  On-going      PT LONG TERM GOAL #2   Title  pain with penile penetration decreased >/= 0-1/10 due to improve tissue mobility    Baseline  pain level 5/10    Time  12    Period  Weeks    Status  On-going      PT LONG TERM GOAL #3   Title  urinary leakage decreased to none to minimal with sneezng and coughing and not have to wear a pad, pelvic floor strength 3/5    Baseline  leakage with sneezng and coughing wearing a 1 pad per day; pelvic floor strength 1/5; leakage is 30% better    Time  12    Period  Weeks    Status  On-going      PT LONG TERM GOAL #4   Title  lower abdominal pain with sitting decreased >/= 0-1/10    Baseline  pain level 3/10    Time  12    Period  Weeks    Status  On-going            Plan - 02/20/19 7588    Clinical Impression Statement  Patient is having 2/10 abdominal pain and rarely notices it. Patient reports intercourse is 20% better and emptying her bladder is 10% better. Patient had tightness in her thoracic and lumbar spine but after manual work full lumbar extension. Patient has increased lower rib cage expansion. Patient still had difficulty with contracting the pelvic floor. Patient has no pain after therapy. Patient will benefit from skilled therapy to reduce pain, urinary leakage, improve tissue mobility and strength.    Personal Factors and Comorbidities  Sex;Comorbidity 1    Comorbidities  interstitial cystitis    Examination-Activity Limitations  Toileting;Continence    PT Frequency  1x / week    PT Duration  12 weeks    PT Treatment/Interventions   Biofeedback;Cryotherapy;Electrical Stimulation;Moist Heat;Ultrasound;Therapeutic activities;Therapeutic exercise;Neuromuscular re-education;Manual techniques;Patient/family education;Dry needling;Passive range of motion    PT Next Visit Plan  soft tissue work to abdomen and pelvic floor,   resisted hip flexion for abdominal contraction for HEP; work on pelvic floor contraction    PT Home Exercise Plan  Access Code: [email or web address]    Recommended Other Services  MD signed initial and renewal    Consulted and Agree with Plan of Care  Patient       Patient will benefit from skilled therapeutic intervention in order to improve the following deficits and impairments:  Increased fascial restricitons, Pain, Decreased coordination, Decreased mobility, Increased muscle spasms, Decreased activity tolerance, Decreased endurance, Decreased range of motion, Decreased strength  Visit Diagnosis: Other muscle spasm  Other lack of coordination  Muscle weakness (generalized)  Interstitial cystitis     Problem List Patient Active Problem List   Diagnosis Date Noted  . GERD without esophagitis 08/31/2018  . Wheezing 08/31/2018  . Depression, major, recurrent (HCC) 04/25/2018  . Generalized anxiety disorder 04/04/2018  . Pyelonephritis 11/04/2017  . Postoperative upper abdominal pain 10/31/2017  . Prediabetes 05/30/2017  . Vitamin D deficiency 05/30/2017  . Other hyperlipidemia 05/30/2017  . Class 1 obesity with serious comorbidity and body mass index (BMI) of 31.0 to 31.9 in adult 05/30/2017  . Migraine without aura and without status migrainosus, not intractable 05/09/2017  . Other fatigue 03/13/2017  . Hyperglycemia 03/13/2017  . Class 1 obesity with  serious comorbidity and body mass index (BMI) of 33.0 to 33.9 in adult 03/13/2017  . Overactive bladder 11/14/2015  . Syncope and collapse 09/27/2015  . Nephrolithiasis 09/27/2015  . Acute pyelonephritis 09/27/2015  . IC (interstitial cystitis)  09/27/2015  . Leukocytosis   . Vesico-ureteral reflux 09/15/2014  . History of kidney stones 07/28/2014  . Syncope 03/20/2012    Earlie Counts, PT 02/20/19 9:00 AM   [facility] [address] [place], [place], [address] Phone: [phone]   Fax:  [phone]  Name: Lauren Cardenas MRN: [id number] Date of Birth: May 11, 1986

## 2019-02-25 ENCOUNTER — Other Ambulatory Visit: Payer: Self-pay

## 2019-02-25 ENCOUNTER — Encounter: Payer: Self-pay | Admitting: Physical Therapy

## 2019-02-25 ENCOUNTER — Ambulatory Visit: Payer: Medicaid Other | Admitting: Physical Therapy

## 2019-02-25 DIAGNOSIS — M62838 Other muscle spasm: Secondary | ICD-10-CM

## 2019-02-25 DIAGNOSIS — R278 Other lack of coordination: Secondary | ICD-10-CM | POA: Diagnosis not present

## 2019-02-25 DIAGNOSIS — M6281 Muscle weakness (generalized): Secondary | ICD-10-CM | POA: Diagnosis not present

## 2019-02-25 DIAGNOSIS — N3010 Interstitial cystitis (chronic) without hematuria: Secondary | ICD-10-CM | POA: Diagnosis not present

## 2019-02-25 NOTE — Patient Instructions (Signed)
Access Code: [email or web address]  URL: [email or web address]  Date: 02/25/2019  Prepared by: Earlie Counts   Exercises Seated Piriformis Stretch with Trunk Bend - 2 reps - 1 sets - 30sec hold - 1x daily - 7x weekly Seated Hamstring Stretch - 2 reps - 1 sets - 30 sec hold - 1x daily - 7x weekly Quadruped Cat Camel - 10 reps - 1 sets - 1x daily - 7x weekly Child's Pose Stretch - 1 reps - 1 sets - 1 min hold - 1x daily - 7x weekly Childs Pose Side Bend - 2 reps - 1 sets - 30 sec hold - 1x daily - 7x weekly Prone Press Up - 2 reps - 1 sets - 5 sec hold - 1x daily - 7x weekly Supine Lower Trunk Rotation - 2 reps - 1 sets - 15 sec hold - 1x daily - 7x weekly Supine Pelvic Floor Stretch - 1 reps - 1 sets - 30 sec hold - 1x daily - 7x weekly Seated Hip Flexor Stretch - 2 reps - 1 sets - 30 sec hold - 1x daily - 7x weekly Bird Dog - 10 reps - 1 sets - 1x daily - 7x weekly Supine Bridge - 10 reps - 1 sets - 1x daily - 7x weekly Bent Knee Fallouts - 10 reps - 1 sets - 1x daily - 7x weekly Bridge with Abduction and Resistance Loop - 10 reps - 1 sets - 1x daily - 7x weekly Supine Hip Adduction Isometric with Ball - 10 reps - 1 sets - 1x daily - 7x weekly [facility] [address], [place],  [address] Phone # [phone] Fax [phone]

## 2019-02-25 NOTE — Therapy (Signed)
[facility] [address], [address] [place], [place], [address] Phone: [phone]   Fax:  [phone]  Physical Therapy Treatment  Patient Details  Name: Lauren CurlingRachael D Cardenas MRN: [id number] Date of Birth: 1986/04/22 Referring Provider (PT): Vianne BullsBree Fleck, [facility]   Encounter Date: 02/25/2019  PT End of Session - 02/25/19 0817    Visit Number  8    Date for PT Re-Evaluation  05/06/19    Authorization Type  Medicaid    Authorization Time Period  02/04/19-04/28/2019    Authorization - Visit Number  4    Authorization - Number of Visits  12    PT Start Time  0815    PT Stop Time  0855    PT Time Calculation (min)  40 min    Activity Tolerance  Patient tolerated treatment well;No increased pain    Behavior During Therapy  WFL for tasks assessed/performed       Past Medical History:  Diagnosis Date  . Chronic migraine    neurologist-  dr Lucia Gaskinsahern  . Constipation   . Depression 05/30/2017   with emotional eating  . Dysuria   . GERD (gastroesophageal reflux disease)   . History of acute pyelonephritis    09-27-2015:   11-04-2017  w/ sepsis due to obstruction from kidney stone  . History of kidney stones   . IC (interstitial cystitis)   . Interstitial cystitis   . Left ureteral stone   . OAB (overactive bladder)   . Pre-diabetes   . Urge urinary incontinence   . Urgency of urination   . Urinary reflux   . Vesicoureteral reflux     Past Surgical History:  Procedure Laterality Date  . CHOLECYSTECTOMY N/A 10/26/2017   Procedure: LAPAROSCOPIC CHOLECYSTECTOMY WITH INTRAOPERATIVE CHOLANGIOGRAM ERAS PATHWAY;  Surgeon: Harriette Bouillonornett, Thomas, MD;  Location: [facility];  Service: General;  Laterality: N/A;  . CYSTOSCOPY W/ URETERAL STENT PLACEMENT Right 09/27/2015   Procedure: CYSTOSCOPY WITH RETROGRADE PYELOGRAM/URETERAL STENT PLACEMENT;  Surgeon: Hildred LaserBrian James Budzyn, MD;  Location: [facility];  Service: Urology;  Laterality: Right;  . CYSTOSCOPY WITH  RETROGRADE PYELOGRAM, URETEROSCOPY AND STENT PLACEMENT Left 09/09/2014   Procedure: CYSTOSCOPY WITH LEFT RETROGRADE PYELOGRAM, URETEROSCOPY AND STONE EXTRACTION  DOUBLE J STENT PLACEMENT;  Surgeon: Jerilee FieldMatthew Eskridge, MD;  Location: [facility];  Service: Urology;  Laterality: Left;  . CYSTOSCOPY WITH STENT PLACEMENT Left 11/04/2017   Procedure: CYSTOSCOPY WITH RETROGRADE AND LEFT STENT PLACEMENT;  Surgeon: Crista ElliotBell, Eugene D III, MD;  [facility];  Service: Urology;  Laterality: Left;  . CYSTOSCOPY/URETEROSCOPY/HOLMIUM LASER/STENT PLACEMENT Right 10/16/2015   Procedure: RIGHT URETEROSCOPY/HOLMIUM LASER/STENT PLACEMENT;  Surgeon: Jerilee FieldMatthew Eskridge, MD;  [facility] [place];  Service: Urology;  Laterality: Right;  . CYSTOSCOPY/URETEROSCOPY/HOLMIUM LASER/STENT PLACEMENT Left 11/21/2017   Procedure: CYSTOSCOPY/URETEROSCOPY/HOLMIUM LASER/STENT PLACEMENT;  Surgeon: Jerilee FieldEskridge, Matthew, MD;  [facility] [place];  Service: Urology;  Laterality: Left;  ONLY NEEDS 60 MIN  . HOLMIUM LASER APPLICATION Left 09/09/2014   Procedure: HOLMIUM LASER APPLICATION;  Surgeon: Jerilee FieldMatthew Eskridge, MD;  [facility];  Service: Urology;  Laterality: Left;  . HOLMIUM LASER APPLICATION Right 10/16/2015   Procedure: HOLMIUM LASER APPLICATION;  Surgeon: Jerilee FieldMatthew Eskridge, MD;  [facility] [place];  Service: Urology;  Laterality: Right;  . STONE EXTRACTION WITH BASKET Right 10/16/2015   Procedure: STONE EXTRACTION WITH BASKET;  Surgeon: Jerilee FieldMatthew Eskridge, MD;  [facility] [place];  Service: Urology;  Laterality: Right;  . WISDOM TOOTH EXTRACTION  03/2014  There were no vitals filed for this visit.  Subjective Assessment - 02/25/19 0825    Subjective  Pulling at the Belly button is 40% better. Emptying her bladder is 10% better.    Patient Stated Goals  less pain, be able to fully empty her bladder    Currently in Pain?  No/denies   for today                    Pelvic Floor Special Questions - 02/25/19 0001    Pelvic Floor Internal Exam  Patient confirms identification and approves PT to assess the pelvic floor and treatment    Exam Type  Vaginal        [facility] Adult PT Treatment/Exercise - 02/25/19 0001      Lumbar Exercises: Aerobic   Nustep  7 minutes; level 3 while assessing patient      Lumbar Exercises: Supine   Bridge with Ball Squeeze  10 reps;1 second    Bridge with clamshell  10 reps   yellow band   Isometric Hip Flexion  10 reps;5 seconds   each leg   Isometric Hip Flexion Limitations  then do diagonal to contract the obliques      Manual Therapy   Manual Therapy  Internal Pelvic Floor    Manual therapy comments  tapping of the bulbocavernosus to stimualte the muscle for contraction    Internal Pelvic Floor  bil. levator ani, obturaor internist, and bulbocavernosus             PT Education - 02/25/19 0837    Education Details  Access Code: [email or web address]    Person(s) Educated  Patient    Methods  Explanation;Demonstration;Handout    Comprehension  Returned demonstration;Verbalized understanding       PT Short Term Goals - 02/20/19 0823      PT SHORT TERM GOAL #1   Title  independent with initial HEP    Time  4    Period  Weeks    Status  Achieved    Target Date  12/20/18      PT SHORT TERM GOAL #2   Title  understand how to massage the pelvic floor to improve tissue mobility    Time  4    Period  Weeks    Status  Achieved    Target Date  12/20/18      PT SHORT TERM GOAL #3   Title  pelvic floor strength is 2/5 due to improved tissue mobilty    Time  4    Period  Weeks    Status  Achieved      PT SHORT TERM GOAL #4   Title  lower abdominal pain decreased >/= 2/10 due to improve tissue mobilty    Time  4    Period  Weeks    Status  Achieved    Target Date  12/20/18        PT Long Term Goals - 01/28/19 16100823      PT LONG TERM GOAL #1   Title  independent with HEP and  understand how to progress herself    Baseline  still learning and progressing her eduction    Time  12    Period  Weeks    Status  On-going      PT LONG TERM GOAL #2   Title  pain with penile penetration decreased >/= 0-1/10 due to improve tissue mobility    Baseline  pain level 5/10  Time  12    Period  Weeks    Status  On-going      PT LONG TERM GOAL #3   Title  urinary leakage decreased to none to minimal with sneezng and coughing and not have to wear a pad, pelvic floor strength 3/5    Baseline  leakage with sneezng and coughing wearing a 1 pad per day; pelvic floor strength 1/5; leakage is 30% better    Time  12    Period  Weeks    Status  On-going      PT LONG TERM GOAL #4   Title  lower abdominal pain with sitting decreased >/= 0-1/10    Baseline  pain level 3/10    Time  12    Period  Weeks    Status  On-going            Plan - 02/25/19 0818    Clinical Impression Statement  Patient reports abdominal pain has improved by 40%. Urinary leakage is 30% better. Paitent wears a pad on occasion. Patient has progressed her exercises. Patient continues to have difficulty with contracting the pelvic floor. Patietn will benefit from skilled therapy to reduce pain, urinary leakage, improve tissue mobility and strength.    Personal Factors and Comorbidities  Sex;Comorbidity 1    Comorbidities  interstitial cystitis    Examination-Activity Limitations  Toileting;Continence    Rehab Potential  Excellent    PT Frequency  1x / week    PT Duration  12 weeks    PT Treatment/Interventions  Biofeedback;Cryotherapy;Electrical Stimulation;Moist Heat;Ultrasound;Therapeutic activities;Therapeutic exercise;Neuromuscular re-education;Manual techniques;Patient/family education;Dry needling;Passive range of motion    PT Next Visit Plan  soft tissue work to abdomen and pelvic floor,    work on pelvic floor contraction    PT Home Exercise Plan  Access Code: [email or web address]    Consulted and Agree  with Plan of Care  Patient       Patient will benefit from skilled therapeutic intervention in order to improve the following deficits and impairments:  Increased fascial restricitons, Pain, Decreased coordination, Decreased mobility, Increased muscle spasms, Decreased activity tolerance, Decreased endurance, Decreased range of motion, Decreased strength  Visit Diagnosis: Other muscle spasm  Other lack of coordination  Muscle weakness (generalized)  Interstitial cystitis     Problem List Patient Active Problem List   Diagnosis Date Noted  . GERD without esophagitis 08/31/2018  . Wheezing 08/31/2018  . Depression, major, recurrent ([place]) 04/25/2018  . Generalized anxiety disorder 04/04/2018  . Pyelonephritis 11/04/2017  . Postoperative upper abdominal pain 10/31/2017  . Prediabetes 05/30/2017  . Vitamin D deficiency 05/30/2017  . Other hyperlipidemia 05/30/2017  . Class 1 obesity with serious comorbidity and body mass index (BMI) of 31.0 to 31.9 in adult 05/30/2017  . Migraine without aura and without status migrainosus, not intractable 05/09/2017  . Other fatigue 03/13/2017  . Hyperglycemia 03/13/2017  . Class 1 obesity with serious comorbidity and body mass index (BMI) of 33.0 to 33.9 in adult 03/13/2017  . Overactive bladder 11/14/2015  . Syncope and collapse 09/27/2015  . Nephrolithiasis 09/27/2015  . Acute pyelonephritis 09/27/2015  . IC (interstitial cystitis) 09/27/2015  . Leukocytosis   . Vesico-ureteral reflux 09/15/2014  . History of kidney stones 07/28/2014  . Syncope 03/20/2012    Earlie Counts, PT 02/25/19 8:59 AM   [facility] [address], [place], [place], [address] Phone: [phone]   Fax:  [phone]  Name: DYMON SUMMERHILL MRN:  [id number] Date of Birth: 20-Nov-1985

## 2019-03-07 ENCOUNTER — Encounter: Payer: Self-pay | Admitting: Physical Therapy

## 2019-03-07 ENCOUNTER — Ambulatory Visit: Payer: Medicaid Other | Attending: Urology | Admitting: Physical Therapy

## 2019-03-07 ENCOUNTER — Other Ambulatory Visit: Payer: Self-pay

## 2019-03-07 DIAGNOSIS — M62838 Other muscle spasm: Secondary | ICD-10-CM | POA: Diagnosis not present

## 2019-03-07 DIAGNOSIS — N3010 Interstitial cystitis (chronic) without hematuria: Secondary | ICD-10-CM | POA: Insufficient documentation

## 2019-03-07 DIAGNOSIS — R278 Other lack of coordination: Secondary | ICD-10-CM | POA: Insufficient documentation

## 2019-03-07 DIAGNOSIS — M6281 Muscle weakness (generalized): Secondary | ICD-10-CM | POA: Insufficient documentation

## 2019-03-07 NOTE — Therapy (Signed)
[facility] [address] [place], [place], [address] Phone: [phone]   Fax:  [phone]  Physical Therapy Treatment  Patient Details  Name: Lauren Cardenas MRN: [id number] Date of Birth: [birth year] Referring Provider (PT): Azucena Fallen, [facility]   Encounter Date: 03/07/2019  PT End of Session - 03/07/19 0843    Visit Number  9    Date for PT Re-Evaluation  05/06/19    Authorization Type  Medicaid    Authorization Time Period  02/04/19-04/28/2019    Authorization - Visit Number  5    Authorization - Number of Visits  12    PT Start Time  0800    PT Stop Time  7371    PT Time Calculation (min)  43 min    Activity Tolerance  Patient tolerated treatment well;No increased pain    Behavior During Therapy  WFL for tasks assessed/performed       Past Medical History:  Diagnosis Date  . Chronic migraine    neurologist-  dr Jaynee Eagles  . Constipation   . Depression 05/30/2017   with emotional eating  . Dysuria   . GERD (gastroesophageal reflux disease)   . History of acute pyelonephritis    09-27-2015:   11-04-2017  w/ sepsis due to obstruction from kidney stone  . History of kidney stones   . IC (interstitial cystitis)   . Interstitial cystitis   . Left ureteral stone   . OAB (overactive bladder)   . Pre-diabetes   . Urge urinary incontinence   . Urgency of urination   . Urinary reflux   . Vesicoureteral reflux     Past Surgical History:  Procedure Laterality Date  . CHOLECYSTECTOMY N/A 10/26/2017   Procedure: LAPAROSCOPIC CHOLECYSTECTOMY WITH INTRAOPERATIVE CHOLANGIOGRAM ERAS PATHWAY;  Surgeon: Erroll Luna, MD;  Location: [place];  Service: General;  Laterality: N/A;  . CYSTOSCOPY W/ URETERAL STENT PLACEMENT Right 09/27/2015   Procedure: CYSTOSCOPY WITH RETROGRADE PYELOGRAM/URETERAL STENT PLACEMENT;  Surgeon: Nickie Retort, MD;  [facility];  Service: Urology;  Laterality: Right;  . CYSTOSCOPY WITH  RETROGRADE PYELOGRAM, URETEROSCOPY AND STENT PLACEMENT Left 09/09/2014   Procedure: CYSTOSCOPY WITH LEFT RETROGRADE PYELOGRAM, URETEROSCOPY AND STONE EXTRACTION  DOUBLE J STENT PLACEMENT;  Surgeon: Festus Aloe, MD;  [facility];  Service: Urology;  Laterality: Left;  . CYSTOSCOPY WITH STENT PLACEMENT Left 11/04/2017   Procedure: CYSTOSCOPY WITH RETROGRADE AND LEFT STENT PLACEMENT;  Surgeon: Lucas Mallow, MD;  Location[facility];  Service: Urology;  Laterality: Left;  . CYSTOSCOPY/URETEROSCOPY/HOLMIUM LASER/STENT PLACEMENT Right 10/16/2015   Procedure: RIGHT URETEROSCOPY/HOLMIUM LASER/STENT PLACEMENT;  Surgeon: Festus Aloe, MD;  [facility];  Service: Urology;  Laterality: Right;  . CYSTOSCOPY/URETEROSCOPY/HOLMIUM LASER/STENT PLACEMENT Left 11/21/2017   Procedure: CYSTOSCOPY/URETEROSCOPY/HOLMIUM LASER/STENT PLACEMENT;  Surgeon: Festus Aloe, MD;  Location[facility];  Service: Urology;  Laterality: Left;  ONLY NEEDS 60 MIN  . HOLMIUM LASER APPLICATION Left 0/62/6948   Procedure: HOLMIUM LASER APPLICATION;  Surgeon: Festus Aloe, MD;  Location: [facility];  Service: Urology;  Laterality: Left;  . HOLMIUM LASER APPLICATION Right 5/46/2703   Procedure: HOLMIUM LASER APPLICATION;  Surgeon: Festus Aloe, MD;  Location: [facility];  Service: Urology;  Laterality: Right;  . STONE EXTRACTION WITH BASKET Right 10/16/2015   Procedure: STONE EXTRACTION WITH BASKET;  Surgeon: Festus Aloe, MD;  Location: [facility];  Service: Urology;  Laterality: Right;  . WISDOM TOOTH EXTRACTION  03/2014  There were no vitals filed for this visit.  Subjective Assessment - 03/07/19 0806    Subjective  Things are good. Past few days more frequency for urination.    Patient Stated Goals  less pain, be able to fully empty her bladder    Currently in Pain?  Yes    Pain Score  3     Pain Location  Abdomen    Pain  Orientation  Mid    Pain Descriptors / Indicators  Aching;Sore;Cramping    Pain Type  Chronic pain    Pain Onset  More than a month ago    Pain Frequency  Intermittent    Aggravating Factors   sitting, urinating, intercourse with initial penetration    Pain Relieving Factors  uses a donut pillow to sit                    Pelvic Floor Special Questions - 03/07/19 0001    Pelvic Floor Internal Exam  Patient confirms identification and approves PT to assess the pelvic floor and treatment    Exam Type  Vaginal    Palpation  left tighter than right, tenderness along the urethra sphincter    Strength  weak squeeze, no lift   circular       [facility] Adult PT Treatment/Exercise - 03/07/19 0001      Self-Care   Self-Care  Other Self-Care Comments    Other Self-Care Comments   sit on a tennis ball and perform self massage to the muscles while sitting in the car      Lumbar Exercises: Stretches   Other Lumbar Stretch Exercise  hands and knees rocking back and forth with head movement.       Lumbar Exercises: Aerobic   Nustep  7 minutes; level 4 while assessing patient      Lumbar Exercises: Quadruped   [place] Horse  15 reps      Manual Therapy   Manual Therapy  Soft tissue mobilization;Internal Pelvic Floor    Soft tissue mobilization  pressure on anterior to the coccyx while rocking in quadruped to release the pelvic floor, sidely soft tissue work to bil. levator ani, obturator internist with contract relax, coccygues with pelvic rocking, around the ishcial tuberosity             PT Education - 03/07/19 0843    Education Details  sitting on a tennis ball and self massage    Person(s) Educated  Patient    Methods  Explanation;Demonstration    Comprehension  Verbalized understanding;Returned demonstration       PT Short Term Goals - 02/20/19 0823      PT SHORT TERM GOAL #1   Title  independent with initial HEP    Time  4    Period  Weeks    Status   Achieved    Target Date  12/20/18      PT SHORT TERM GOAL #2   Title  understand how to massage the pelvic floor to improve tissue mobility    Time  4    Period  Weeks    Status  Achieved    Target Date  12/20/18      PT SHORT TERM GOAL #3   Title  pelvic floor strength is 2/5 due to improved tissue mobilty    Time  4    Period  Weeks    Status  Achieved      PT SHORT TERM GOAL #4  Title  lower abdominal pain decreased >/= 2/10 due to improve tissue mobilty    Time  4    Period  Weeks    Status  Achieved    Target Date  12/20/18        PT Long Term Goals - 03/07/19 0847      PT LONG TERM GOAL #2   Title  pain with penile penetration decreased >/= 0-1/10 due to improve tissue mobility    Baseline  60% better    Time  12    Period  Weeks    Status  On-going            Plan - 03/07/19 0845    Clinical Impression Statement  Patient reports her pain is 60% better with intercourse. Patient is able to form a circular contraction with pelvic floor contraction. The left pelvic floor is tighter than the right. Patient responded well with the pelvic flor release in quadruped. Patient had increased urgency last week. Patient will benefit from skilled therapy to reduce pain, urinary leakage, improve tissue mobility and strength.    Personal Factors and Comorbidities  Sex;Comorbidity 1    Comorbidities  interstitial cystitis    Examination-Activity Limitations  Toileting;Continence    Rehab Potential  Excellent    PT Frequency  1x / week    PT Duration  12 weeks    PT Treatment/Interventions  Biofeedback;Cryotherapy;Electrical Stimulation;Moist Heat;Ultrasound;Therapeutic activities;Therapeutic exercise;Neuromuscular re-education;Manual techniques;Patient/family education;Dry needling;Passive range of motion    PT Next Visit Plan  soft tissue work to abdomen and pelvic floor,    work on pelvic floor contraction Patient will not be n next week due to vacation    PT Home Exercise  Plan  Access Code: [email or web address]    Consulted and Agree with Plan of Care  Patient       Patient will benefit from skilled therapeutic intervention in order to improve the following deficits and impairments:  Increased fascial restricitons, Pain, Decreased coordination, Decreased mobility, Increased muscle spasms, Decreased activity tolerance, Decreased endurance, Decreased range of motion, Decreased strength  Visit Diagnosis: Other muscle spasm  Other lack of coordination  Muscle weakness (generalized)  Interstitial cystitis     Problem List Patient Active Problem List   Diagnosis Date Noted  . GERD without esophagitis 08/31/2018  . Wheezing 08/31/2018  . Depression, major, recurrent ([facility]) 04/25/2018  . Generalized anxiety disorder 04/04/2018  . Pyelonephritis 11/04/2017  . Postoperative upper abdominal pain 10/31/2017  . Prediabetes 05/30/2017  . Vitamin D deficiency 05/30/2017  . Other hyperlipidemia 05/30/2017  . Class 1 obesity with serious comorbidity and body mass index (BMI) of 31.0 to 31.9 in adult 05/30/2017  . Migraine without aura and without status migrainosus, not intractable 05/09/2017  . Other fatigue 03/13/2017  . Hyperglycemia 03/13/2017  . Class 1 obesity with serious comorbidity and body mass index (BMI) of 33.0 to 33.9 in adult 03/13/2017  . Overactive bladder 11/14/2015  . Syncope and collapse 09/27/2015  . Nephrolithiasis 09/27/2015  . Acute pyelonephritis 09/27/2015  . IC (interstitial cystitis) 09/27/2015  . Leukocytosis   . Vesico-ureteral reflux 09/15/2014  . History of kidney stones 07/28/2014  . Syncope 03/20/2012    Eulis Fosterheryl , PT 03/07/19 8:48 AM   [facility] [address], [address] [place], [place], [address] Phone: [phone]   Fax:  [phone]  Name: Lauren Cardenas MRN: [id number] Date of Birth: [birth year]

## 2019-03-14 ENCOUNTER — Encounter: Payer: Medicaid Other | Admitting: Physical Therapy

## 2019-03-18 ENCOUNTER — Encounter: Payer: Medicaid Other | Admitting: Physical Therapy

## 2019-03-25 ENCOUNTER — Ambulatory Visit: Payer: Medicaid Other | Admitting: Physical Therapy

## 2019-03-25 ENCOUNTER — Other Ambulatory Visit: Payer: Self-pay

## 2019-03-25 ENCOUNTER — Encounter: Payer: Self-pay | Admitting: Physical Therapy

## 2019-03-25 DIAGNOSIS — M6281 Muscle weakness (generalized): Secondary | ICD-10-CM

## 2019-03-25 DIAGNOSIS — M62838 Other muscle spasm: Secondary | ICD-10-CM

## 2019-03-25 DIAGNOSIS — N3010 Interstitial cystitis (chronic) without hematuria: Secondary | ICD-10-CM | POA: Diagnosis not present

## 2019-03-25 DIAGNOSIS — R278 Other lack of coordination: Secondary | ICD-10-CM

## 2019-03-25 NOTE — Patient Instructions (Addendum)
Slow Contraction: Gravity Eliminated (Hook-Lying)    Lie with hips and knees bent. Slowly squeeze pelvic floor for _5__ seconds. Rest for _10__ seconds. Repeat _5__ times. Do _2-3__ times a day.   Copyright  VHI. All rights reserved.  [place] [address], [place], [place] [address] Phone # [phone] Fax [phone]

## 2019-03-25 NOTE — Therapy (Signed)
[facility] [address], [address] [place], [place], [address] Phone: [phone]   Fax:  [phone]  Physical Therapy Treatment  Patient Details  Name: Lauren Cardenas MRN: [id number] Date of Birth: 09-07-1985 Referring Provider (PT): Vianne BullsBree Fleck, [facility]   Encounter Date: 03/25/2019  PT End of Session - 03/25/19 0820    Visit Number  10    Date for PT Re-Evaluation  05/06/19    Authorization Type  Medicaid    Authorization Time Period  02/04/19-04/28/2019    Authorization - Visit Number  6    Authorization - Number of Visits  12    PT Start Time  0815    PT Stop Time  0900    PT Time Calculation (min)  45 min    Activity Tolerance  Patient tolerated treatment well;No increased pain    Behavior During Therapy  WFL for tasks assessed/performed       Past Medical History:  Diagnosis Date  . Chronic migraine    neurologist-  dr Lucia Gaskinsahern  . Constipation   . Depression 05/30/2017   with emotional eating  . Dysuria   . GERD (gastroesophageal reflux disease)   . History of acute pyelonephritis    09-27-2015:   11-04-2017  w/ sepsis due to obstruction from kidney stone  . History of kidney stones   . IC (interstitial cystitis)   . Interstitial cystitis   . Left ureteral stone   . OAB (overactive bladder)   . Pre-diabetes   . Urge urinary incontinence   . Urgency of urination   . Urinary reflux   . Vesicoureteral reflux     Past Surgical History:  Procedure Laterality Date  . CHOLECYSTECTOMY N/A 10/26/2017   Procedure: LAPAROSCOPIC CHOLECYSTECTOMY WITH INTRAOPERATIVE CHOLANGIOGRAM ERAS PATHWAY;  Surgeon: Harriette Bouillonornett, Thomas, MD;  Location: [facility];  Service: General;  Laterality: N/A;  . CYSTOSCOPY W/ URETERAL STENT PLACEMENT Right 09/27/2015   Procedure: CYSTOSCOPY WITH RETROGRADE PYELOGRAM/URETERAL STENT PLACEMENT;  Surgeon: Hildred LaserBrian James Budzyn, MD;  Location: [facility];  Service: Urology;  Laterality: Right;  . CYSTOSCOPY WITH  RETROGRADE PYELOGRAM, URETEROSCOPY AND STENT PLACEMENT Left 09/09/2014   Procedure: CYSTOSCOPY WITH LEFT RETROGRADE PYELOGRAM, URETEROSCOPY AND STONE EXTRACTION  DOUBLE J STENT PLACEMENT;  Surgeon: Jerilee FieldMatthew Eskridge, MD;  Location: [facility];  Service: Urology;  Laterality: Left;  . CYSTOSCOPY WITH STENT PLACEMENT Left 11/04/2017   Procedure: CYSTOSCOPY WITH RETROGRADE AND LEFT STENT PLACEMENT;  Surgeon: Crista ElliotBell, Eugene D III, MD;  Location[facility];  Service: Urology;  Laterality: Left;  . CYSTOSCOPY/URETEROSCOPY/HOLMIUM LASER/STENT PLACEMENT Right 10/16/2015   Procedure: RIGHT URETEROSCOPY/HOLMIUM LASER/STENT PLACEMENT;  Surgeon: Jerilee FieldMatthew Eskridge, MD;  [facility] [place];  Service: Urology;  Laterality: Right;  . CYSTOSCOPY/URETEROSCOPY/HOLMIUM LASER/STENT PLACEMENT Left 11/21/2017   Procedure: CYSTOSCOPY/URETEROSCOPY/HOLMIUM LASER/STENT PLACEMENT;  Surgeon: Jerilee FieldEskridge, Matthew, MD;  [facility] [place];  Service: Urology;  Laterality: Left;  ONLY NEEDS 60 MIN  . HOLMIUM LASER APPLICATION Left 09/09/2014   Procedure: HOLMIUM LASER APPLICATION;  Surgeon: Jerilee FieldMatthew Eskridge, MD;  [facility];  Service: Urology;  Laterality: Left;  . HOLMIUM LASER APPLICATION Right 10/16/2015   Procedure: HOLMIUM LASER APPLICATION;  Surgeon: Jerilee FieldMatthew Eskridge, MD;  [facility] [place];  Service: Urology;  Laterality: Right;  . STONE EXTRACTION WITH BASKET Right 10/16/2015   Procedure: STONE EXTRACTION WITH BASKET;  Surgeon: Jerilee FieldMatthew Eskridge, MD;  [facility] [place];  Service: Urology;  Laterality: Right;  . WISDOM TOOTH EXTRACTION  03/2014  There were no vitals filed for this visit.  Subjective Assessment - 03/25/19 0818    Subjective  I was able to tolerate the car ride using my pillow and doing my stretches.    Patient Stated Goals  less pain, be able to fully empty her bladder    Currently in Pain?  No/denies    Multiple Pain Sites  No                     Pelvic Floor Special Questions - 03/25/19 0001    Pelvic Floor Internal Exam  Patient confirms identification and approves PT to assess the pelvic floor and treatment    Exam Type  Vaginal    Palpation  left pelvic floor is tighter    Strength  weak squeeze, no lift   used 2 fingers to facilitate contraction       [facility] Adult PT Treatment/Exercise - 03/25/19 0001      Therapeutic Activites    Therapeutic Activities  Other Therapeutic Activities    Other Therapeutic Activities  toileting technique to urinate with greater ease with diaphragmatic breathing, pelvic floor rocking and leaning forward      Neuro Re-ed    Neuro Re-ed Details   tactile cues to pelvic floor to contract and tapping to the muscles      Lumbar Exercises: Stretches   Quadruped Mid Back Stretch  1 rep;60 seconds    Quadruped Mid Back Stretch Limitations  while therapist was massaging the quadratus lumborum    Piriformis Stretch  Right;Left;1 rep;60 seconds    Piriformis Stretch Limitations  whiel using addaday for massageing the piriformis    Other Lumbar Stretch Exercise  hip adductor stretch in sittng while using the addaday to massage the hip adductors      Lumbar Exercises: Quadruped   [place]  15 reps      Manual Therapy   Soft tissue mobilization  pressure on anterior to the coccyx while rocking  and diagonals in quadruped to release the pelvic floor,             PT Education - 03/25/19 0906    Education Details  pelvic floor contraction in supine, toileting technique    Person(s) Educated  Patient    Methods  Explanation;Demonstration;Handout    Comprehension  Verbalized understanding;Returned demonstration       PT Short Term Goals - 02/20/19 0823      PT SHORT TERM GOAL #1   Title  independent with initial HEP    Time  4    Period  Weeks    Status  Achieved    Target Date  12/20/18      PT SHORT TERM GOAL #2   Title  understand how to massage  the pelvic floor to improve tissue mobility    Time  4    Period  Weeks    Status  Achieved    Target Date  12/20/18      PT SHORT TERM GOAL #3   Title  pelvic floor strength is 2/5 due to improved tissue mobilty    Time  4    Period  Weeks    Status  Achieved      PT SHORT TERM GOAL #4   Title  lower abdominal pain decreased >/= 2/10 due to improve tissue mobilty    Time  4    Period  Weeks    Status  Achieved    Target Date  12/20/18        PT Long Term Goals - 03/25/19 0277      PT LONG TERM GOAL #1   Title  independent with HEP and understand how to progress herself    Baseline  still learning and progressing her eduction    Time  12    Period  Weeks    Status  On-going      PT LONG TERM GOAL #2   Title  pain with penile penetration decreased >/= 0-1/10 due to improve tissue mobility    Baseline  60% better; 4/10    Time  12    Period  Weeks    Status  On-going      PT LONG TERM GOAL #3   Title  urinary leakage decreased to none to minimal with sneezng and coughing and not have to wear a pad, pelvic floor strength 3/5    Baseline  leakage with sneezng and coughing wearing a 1 pad per day; pelvic floor strength 1/5; leakage is 30% better, leaked with intercourse    Time  12    Period  Weeks    Status  On-going      PT LONG TERM GOAL #4   Title  lower abdominal pain with sitting decreased >/= 0-1/10    Baseline  decreased by 40%    Time  12    Period  Weeks    Status  On-going            Plan - 03/25/19 0825    Clinical Impression Statement  Patient reports pain with intercurse is 60% better and 4/10 pain. Patient abdominal pain is 40% better. Patient is able to sit for the long car ride to visit her mom with greater ease doing her stretches and using her pillow. Patiet urinated while having intercourse for the first time. Patient needed tactile cues to assist in pelvic floor contraction. Patient will benefit from skilled therapy to reduce, urinary  leakage, improve tissue mobility and strength.    Personal Factors and Comorbidities  Sex;Comorbidity 1    Comorbidities  interstitial cystitis    Examination-Activity Limitations  Toileting;Continence    Rehab Potential  Excellent    PT Frequency  1x / week    PT Duration  12 weeks    PT Treatment/Interventions  Biofeedback;Cryotherapy;Electrical Stimulation;Moist Heat;Ultrasound;Therapeutic activities;Therapeutic exercise;Neuromuscular re-education;Manual techniques;Patient/family education;Dry needling;Passive range of motion    PT Next Visit Plan  soft tissue work to abdomen and pelvic floor,   work on pelvic floor contraction and internal work    PT Home Exercise Plan  Access Code: [email or web address]    Consulted and Agree with Plan of Care  Patient       Patient will benefit from skilled therapeutic intervention in order to improve the following deficits and impairments:  Increased fascial restricitons, Pain, Decreased coordination, Decreased mobility, Increased muscle spasms, Decreased activity tolerance, Decreased endurance, Decreased range of motion, Decreased strength  Visit Diagnosis: Other muscle spasm  Other lack of coordination  Muscle weakness (generalized)  Interstitial cystitis     Problem List Patient Active Problem List   Diagnosis Date Noted  . GERD without esophagitis 08/31/2018  . Wheezing 08/31/2018  . Depression, major, recurrent (HCC) 04/25/2018  . Generalized anxiety disorder 04/04/2018  . Pyelonephritis 11/04/2017  . Postoperative upper abdominal pain 10/31/2017  . Prediabetes 05/30/2017  . Vitamin D deficiency 05/30/2017  . Other hyperlipidemia 05/30/2017  . Class 1 obesity with serious comorbidity and body mass index (  BMI) of 31.0 to 31.9 in adult 05/30/2017  . Migraine without aura and without status migrainosus, not intractable 05/09/2017  . Other fatigue 03/13/2017  . Hyperglycemia 03/13/2017  . Class 1 obesity with serious comorbidity and body mass  index (BMI) of 33.0 to 33.9 in adult 03/13/2017  . Overactive bladder 11/14/2015  . Syncope and collapse 09/27/2015  . Nephrolithiasis 09/27/2015  . Acute pyelonephritis 09/27/2015  . IC (interstitial cystitis) 09/27/2015  . Leukocytosis   . Vesico-ureteral reflux 09/15/2014  . History of kidney stones 07/28/2014  . Syncope 03/20/2012    Earlie Counts, PT 03/25/19 9:12 AM   [facility] [address], [place], [place], [address] Phone: [phone]   Fax:  [phone]  Name: Lauren Cardenas MRN: [id number] Date of Birth: 04-28-1986

## 2019-04-03 ENCOUNTER — Ambulatory Visit: Payer: Medicaid Other | Attending: Urology | Admitting: Physical Therapy

## 2019-04-03 ENCOUNTER — Encounter: Payer: Self-pay | Admitting: Physical Therapy

## 2019-04-03 ENCOUNTER — Other Ambulatory Visit: Payer: Self-pay

## 2019-04-03 DIAGNOSIS — R278 Other lack of coordination: Secondary | ICD-10-CM

## 2019-04-03 DIAGNOSIS — M62838 Other muscle spasm: Secondary | ICD-10-CM | POA: Diagnosis not present

## 2019-04-03 DIAGNOSIS — N3010 Interstitial cystitis (chronic) without hematuria: Secondary | ICD-10-CM | POA: Diagnosis not present

## 2019-04-03 DIAGNOSIS — M6281 Muscle weakness (generalized): Secondary | ICD-10-CM | POA: Diagnosis not present

## 2019-04-03 NOTE — Therapy (Signed)
[facility] [address], [place], [place], [address] Phone: [phone]   Fax:  [phone]  Physical Therapy Treatment  Patient Details  Name: Lauren Cardenas MRN: [id number] Date of Birth: 02-Mar-1986 Referring Provider (PT): Azucena Fallen, [facility]   Encounter Date: 04/03/2019  PT End of Session - 04/03/19 0806    Visit Number  11    Date for PT Re-Evaluation  05/06/19    Authorization Type  Medicaid    Authorization Time Period  02/04/19-04/28/2019    Authorization - Visit Number  7    Authorization - Number of Visits  12    PT Start Time  0805    PT Stop Time  4081    PT Time Calculation (min)  38 min    Activity Tolerance  Patient tolerated treatment well;No increased pain    Behavior During Therapy  WFL for tasks assessed/performed       Past Medical History:  Diagnosis Date  . Chronic migraine    neurologist-  dr Jaynee Eagles  . Constipation   . Depression 05/30/2017   with emotional eating  . Dysuria   . GERD (gastroesophageal reflux disease)   . History of acute pyelonephritis    09-27-2015:   11-04-2017  w/ sepsis due to obstruction from kidney stone  . History of kidney stones   . IC (interstitial cystitis)   . Interstitial cystitis   . Left ureteral stone   . OAB (overactive bladder)   . Pre-diabetes   . Urge urinary incontinence   . Urgency of urination   . Urinary reflux   . Vesicoureteral reflux     Past Surgical History:  Procedure Laterality Date  . CHOLECYSTECTOMY N/A 10/26/2017   Procedure: LAPAROSCOPIC CHOLECYSTECTOMY WITH INTRAOPERATIVE CHOLANGIOGRAM ERAS PATHWAY;  Surgeon: Erroll Luna, MD;  Location: [place];  Service: General;  Laterality: N/A;  . CYSTOSCOPY W/ URETERAL STENT PLACEMENT Right 09/27/2015   Procedure: CYSTOSCOPY WITH RETROGRADE PYELOGRAM/URETERAL STENT PLACEMENT;  Surgeon: Nickie Retort, MD;  [facility];  Service: Urology;  Laterality: Right;  . CYSTOSCOPY WITH  RETROGRADE PYELOGRAM, URETEROSCOPY AND STENT PLACEMENT Left 09/09/2014   Procedure: CYSTOSCOPY WITH LEFT RETROGRADE PYELOGRAM, URETEROSCOPY AND STONE EXTRACTION  DOUBLE J STENT PLACEMENT;  Surgeon: Festus Aloe, MD;  Location: [facility];  Service: Urology;  Laterality: Left;  . CYSTOSCOPY WITH STENT PLACEMENT Left 11/04/2017   Procedure: CYSTOSCOPY WITH RETROGRADE AND LEFT STENT PLACEMENT;  Surgeon: Lucas Mallow, MD;  Location[facility];  Service: Urology;  Laterality: Left;  . CYSTOSCOPY/URETEROSCOPY/HOLMIUM LASER/STENT PLACEMENT Right 10/16/2015   Procedure: RIGHT URETEROSCOPY/HOLMIUM LASER/STENT PLACEMENT;  Surgeon: Festus Aloe, MD;  Location[facility];  Service: Urology;  Laterality: Right;  . CYSTOSCOPY/URETEROSCOPY/HOLMIUM LASER/STENT PLACEMENT Left 11/21/2017   Procedure: CYSTOSCOPY/URETEROSCOPY/HOLMIUM LASER/STENT PLACEMENT;  Surgeon: Festus Aloe, MD;  Location[facility];  Service: Urology;  Laterality: Left;  ONLY NEEDS 60 MIN  . HOLMIUM LASER APPLICATION Left 4/48/1856   Procedure: HOLMIUM LASER APPLICATION;  Surgeon: Festus Aloe, MD;  [facility];  Service: Urology;  Laterality: Left;  . HOLMIUM LASER APPLICATION Right 09/07/9700   Procedure: HOLMIUM LASER APPLICATION;  Surgeon: Festus Aloe, MD;  Location: [facility];  Service: Urology;  Laterality: Right;  . STONE EXTRACTION WITH BASKET Right 10/16/2015   Procedure: STONE EXTRACTION WITH BASKET;  Surgeon: Festus Aloe, MD;  Location[facility];  Service: Urology;  Laterality: Right;  . WISDOM TOOTH EXTRACTION  03/2014  There were no vitals filed for this visit.  Subjective Assessment - 04/03/19 0810    Subjective  Abdominal pain is 40% better.    Patient Stated Goals  less pain, be able to fully empty her bladder    Currently in Pain?  Yes    Pain Score  4     Pain Location  Abdomen    Pain Orientation  Mid    Pain Descriptors /  Indicators  Aching;Sore;Cramping    Pain Type  Chronic pain    Pain Onset  More than a month ago    Pain Frequency  Intermittent    Aggravating Factors   sitting, urinating, intercourse with initial penetration    Pain Relieving Factors  uses a donut pillow to sit, heat,    Multiple Pain Sites  No                       [facility] Adult PT Treatment/Exercise - 04/03/19 0001      Self-Care   Self-Care  Other Self-Care Comments    Other Self-Care Comments   discussed diet, ways to manage abodminal pain      Lumbar Exercises: Stretches   Active Hamstring Stretch  Right;Left;1 rep;60 seconds    Active Hamstring Stretch Limitations  using a strap; using the addaday to the hamstring    Press Ups  10 reps    Quadruped Mid Back Stretch  60 seconds;3 reps   all three directions   Quadruped Mid Back Stretch Limitations  while therapist was massaging the quadratus lumborum    Piriformis Stretch  Right;Left;1 rep;60 seconds   pigeon pose   Piriformis Stretch Limitations  whiel using addaday for massageing the piriformis    Other Lumbar Stretch Exercise  hip adductor stretch in sittng while using the addaday to massage the hip adductors      Lumbar Exercises: Aerobic   Nustep  7 minutes; level 4 while assessing patient               PT Short Term Goals - 02/20/19 0823      PT SHORT TERM GOAL #1   Title  independent with initial HEP    Time  4    Period  Weeks    Status  Achieved    Target Date  12/20/18      PT SHORT TERM GOAL #2   Title  understand how to massage the pelvic floor to improve tissue mobility    Time  4    Period  Weeks    Status  Achieved    Target Date  12/20/18      PT SHORT TERM GOAL #3   Title  pelvic floor strength is 2/5 due to improved tissue mobilty    Time  4    Period  Weeks    Status  Achieved      PT SHORT TERM GOAL #4   Title  lower abdominal pain decreased >/= 2/10 due to improve tissue mobilty    Time  4    Period  Weeks     Status  Achieved    Target Date  12/20/18        PT Long Term Goals - 04/03/19 0816      PT LONG TERM GOAL #1   Title  independent with HEP and understand how to progress herself    Baseline  still learning and progressing her eduction    Time  12  Period  Weeks    Status  On-going      PT LONG TERM GOAL #2   Title  pain with penile penetration decreased >/= 0-1/10 due to improve tissue mobility    Baseline  70% better; 4/10    Time  12    Period  Weeks    Status  On-going      PT LONG TERM GOAL #3   Title  urinary leakage decreased to none to minimal with sneezng and coughing and not have to wear a pad, pelvic floor strength 3/5    Baseline  leakage with sneezng and coughing wearing a 1 pad per day; pelvic floor strength 1/5; leakage is 40% better    Time  12    Period  Weeks    Status  On-going      PT LONG TERM GOAL #4   Title  lower abdominal pain with sitting decreased >/= 0-1/10    Baseline  decreased by 40%    Time  12    Period  Weeks    Status  On-going            Plan - 04/03/19 0807    Clinical Impression Statement  Patient is having 70% less pain with intercourse. Patient abdominal pain is 40% better. Patient abdominal pain decreased bbfrom 4/10 to 2/10 after manual work. Patient reports strawberries will aggravate abdomianl pain. Patient uses heat for pain relief. Discussed with patient on how to manage lower abdominal pain. Urinary leakge decreased by 40%. Patient will beneift from skilled therapy to reduce urinary leakage, improve tissue mobility and strength.    Personal Factors and Comorbidities  Sex;Comorbidity 1    Comorbidities  interstitial cystitis    Examination-Activity Limitations  Toileting;Continence    Rehab Potential  Excellent    PT Frequency  1x / week    PT Duration  12 weeks    PT Treatment/Interventions  Biofeedback;Cryotherapy;Electrical Stimulation;Moist Heat;Ultrasound;Therapeutic activities;Therapeutic exercise;Neuromuscular  re-education;Manual techniques;Patient/family education;Dry needling;Passive range of motion    PT Next Visit Plan  soft tissue work to abdomen and pelvic floor,   work on pelvic floor contraction and internal work    PT Home Exercise Plan  Access Code: [email or web address]    Consulted and Agree with Plan of Care  Patient       Patient will benefit from skilled therapeutic intervention in order to improve the following deficits and impairments:  Increased fascial restricitons, Pain, Decreased coordination, Decreased mobility, Increased muscle spasms, Decreased activity tolerance, Decreased endurance, Decreased range of motion, Decreased strength  Visit Diagnosis: Other muscle spasm  Other lack of coordination  Muscle weakness (generalized)  Interstitial cystitis     Problem List Patient Active Problem List   Diagnosis Date Noted  . GERD without esophagitis 08/31/2018  . Wheezing 08/31/2018  . Depression, major, recurrent (HCC) 04/25/2018  . Generalized anxiety disorder 04/04/2018  . Pyelonephritis 11/04/2017  . Postoperative upper abdominal pain 10/31/2017  . Prediabetes 05/30/2017  . Vitamin D deficiency 05/30/2017  . Other hyperlipidemia 05/30/2017  . Class 1 obesity with serious comorbidity and body mass index (BMI) of 31.0 to 31.9 in adult 05/30/2017  . Migraine without aura and without status migrainosus, not intractable 05/09/2017  . Other fatigue 03/13/2017  . Hyperglycemia 03/13/2017  . Class 1 obesity with serious comorbidity and body mass index (BMI) of 33.0 to 33.9 in adult 03/13/2017  . Overactive bladder 11/14/2015  . Syncope and collapse 09/27/2015  . Nephrolithiasis 09/27/2015  . Acute pyelonephritis  09/27/2015  . IC (interstitial cystitis) 09/27/2015  . Leukocytosis   . Vesico-ureteral reflux 09/15/2014  . History of kidney stones 07/28/2014  . Syncope 03/20/2012    Lauren Cardenas, PT 04/03/19 8:46 AM   [facility] [address] [place], [place], [address] Phone: [phone]   Fax:  [phone]  Name: Lauren Cardenas MRN: [id number] Date of Birth: [birth year]

## 2019-04-10 ENCOUNTER — Ambulatory Visit: Payer: Medicaid Other | Admitting: Physical Therapy

## 2019-04-10 ENCOUNTER — Other Ambulatory Visit: Payer: Self-pay

## 2019-04-10 ENCOUNTER — Encounter: Payer: Self-pay | Admitting: Physical Therapy

## 2019-04-10 DIAGNOSIS — M6281 Muscle weakness (generalized): Secondary | ICD-10-CM | POA: Diagnosis not present

## 2019-04-10 DIAGNOSIS — M62838 Other muscle spasm: Secondary | ICD-10-CM

## 2019-04-10 DIAGNOSIS — N3010 Interstitial cystitis (chronic) without hematuria: Secondary | ICD-10-CM

## 2019-04-10 DIAGNOSIS — R278 Other lack of coordination: Secondary | ICD-10-CM | POA: Diagnosis not present

## 2019-04-10 NOTE — Therapy (Signed)
[facility] [address], [address] [place], [place], [address] Phone: [phone]   Fax:  [phone]  Physical Therapy Treatment  Patient Details  Name: Lauren CurlingRachael D Cardenas MRN: [id number] Date of Birth: 1985-10-05 Referring Provider (PT): Vianne BullsBree Fleck, [facility]   Encounter Date: 04/10/2019  PT End of Session - 04/10/19 0809    Visit Number  12    Date for PT Re-Evaluation  05/06/19    Authorization Type  Medicaid    Authorization Time Period  02/04/19-04/28/2019    Authorization - Visit Number  8    Authorization - Number of Visits  12    PT Start Time  0805    PT Stop Time  0900    PT Time Calculation (min)  55 min    Activity Tolerance  Patient tolerated treatment well;No increased pain    Behavior During Therapy  WFL for tasks assessed/performed       Past Medical History:  Diagnosis Date  . Chronic migraine    neurologist-  dr Lucia Gaskinsahern  . Constipation   . Depression 05/30/2017   with emotional eating  . Dysuria   . GERD (gastroesophageal reflux disease)   . History of acute pyelonephritis    09-27-2015:   11-04-2017  w/ sepsis due to obstruction from kidney stone  . History of kidney stones   . IC (interstitial cystitis)   . Interstitial cystitis   . Left ureteral stone   . OAB (overactive bladder)   . Pre-diabetes   . Urge urinary incontinence   . Urgency of urination   . Urinary reflux   . Vesicoureteral reflux     Past Surgical History:  Procedure Laterality Date  . CHOLECYSTECTOMY N/A 10/26/2017   Procedure: LAPAROSCOPIC CHOLECYSTECTOMY WITH INTRAOPERATIVE CHOLANGIOGRAM ERAS PATHWAY;  Surgeon: Harriette Bouillonornett, Thomas, MD;  [facility]: [facility];  Service: General;  Laterality: N/A;  . CYSTOSCOPY W/ URETERAL STENT PLACEMENT Right 09/27/2015   Procedure: CYSTOSCOPY WITH RETROGRADE PYELOGRAM/URETERAL STENT PLACEMENT;  Surgeon: Hildred LaserBrian James Budzyn, MD;  Location: [facility];  Service: Urology;  Laterality: Right;  . CYSTOSCOPY WITH  RETROGRADE PYELOGRAM, URETEROSCOPY AND STENT PLACEMENT Left 09/09/2014   Procedure: CYSTOSCOPY WITH LEFT RETROGRADE PYELOGRAM, URETEROSCOPY AND STONE EXTRACTION  DOUBLE J STENT PLACEMENT;  Surgeon: Jerilee FieldMatthew Eskridge, MD;  [facility];  Service: Urology;  Laterality: Left;  . CYSTOSCOPY WITH STENT PLACEMENT Left 11/04/2017   Procedure: CYSTOSCOPY WITH RETROGRADE AND LEFT STENT PLACEMENT;  Surgeon: Crista ElliotBell, Eugene D III, MD;  [facility];  Service: Urology;  Laterality: Left;  . CYSTOSCOPY/URETEROSCOPY/HOLMIUM LASER/STENT PLACEMENT Right 10/16/2015   Procedure: RIGHT URETEROSCOPY/HOLMIUM LASER/STENT PLACEMENT;  Surgeon: Jerilee FieldMatthew Eskridge, MD;  [facility] [place];  Service: Urology;  Laterality: Right;  . CYSTOSCOPY/URETEROSCOPY/HOLMIUM LASER/STENT PLACEMENT Left 11/21/2017   Procedure: CYSTOSCOPY/URETEROSCOPY/HOLMIUM LASER/STENT PLACEMENT;  Surgeon: Jerilee FieldEskridge, Matthew, MD;  [facility] [place];  Service: Urology;  Laterality: Left;  ONLY NEEDS 60 MIN  . HOLMIUM LASER APPLICATION Left 09/09/2014   Procedure: HOLMIUM LASER APPLICATION;  Surgeon: Jerilee FieldMatthew Eskridge, MD;  [facility];  Service: Urology;  Laterality: Left;  . HOLMIUM LASER APPLICATION Right 10/16/2015   Procedure: HOLMIUM LASER APPLICATION;  Surgeon: Jerilee FieldMatthew Eskridge, MD;  [facility] [place];  Service: Urology;  Laterality: Right;  . STONE EXTRACTION WITH BASKET Right 10/16/2015   Procedure: STONE EXTRACTION WITH BASKET;  Surgeon: Jerilee FieldMatthew Eskridge, MD;  [facility] [place];  Service: Urology;  Laterality: Right;  . WISDOM TOOTH EXTRACTION  03/2014  There were no vitals filed for this visit.  Subjective Assessment - 04/10/19 0810    Subjective  The pain is not as intense. Leakage is everyday. The pads are 50% less damp. Wearing 1 pad per day.    Patient Stated Goals  less pain, be able to fully empty her bladder    Currently in Pain?  Yes    Pain Score  3      Pain Location  Abdomen    Pain Orientation  Mid    Pain Descriptors / Indicators  Aching;Sore;Cramping    Pain Type  Chronic pain    Pain Onset  More than a month ago    Pain Frequency  Intermittent    Aggravating Factors   sitting, urinating, intercourse with initial penetration    Pain Relieving Factors  uses a donut pillow to sit, heat    Multiple Pain Sites  No                    Pelvic Floor Special Questions - 04/10/19 0001    Pelvic Floor Internal Exam  Patient confirms identification and approves PT to assess the pelvic floor and treatment    Exam Type  Vaginal    Palpation  left pelvic floor is tighter, perineal body decreased mobiility    Strength  weak squeeze, no lift   used 2 fingers to facilitate contraction; slight lift at end       [facility] Adult PT Treatment/Exercise - 04/10/19 0001      Neuro Re-ed    Neuro Re-ed Details   deep breathing with pulling belly in to facilitate the lower abdominal and pelvic floor to contract. Patient needed many tactile and verbal cues. using 2 fingers in the vaginal canal to give extra pressure for facililtating the pelvi cfloor contraction      Lumbar Exercises: Stretches   Active Hamstring Stretch  Right;Left;1 rep;60 seconds    Active Hamstring Stretch Limitations  using a strap, also bring leg across body and out to the side    Press Ups  10 reps    Press Ups Limitations  with overpressure    Quadruped Mid Back Stretch  30 seconds;2 reps    Quadruped Mid Back Stretch Limitations  with legs apart    Piriformis Stretch  Right;Left;1 rep;30 seconds   supine with strap     Lumbar Exercises: Aerobic   Nustep  6 minutes; level 4 while assessing patient      Manual Therapy   Manual Therapy  Joint mobilization;Internal Pelvic Floor    Manual therapy comments  contract relax for ER in prone with towerl roll under knee bil.     Joint Mobilization  P-A mobilization to T3-T12 in prone grade 3; anterior mobilization to bil.  femur grade 3    Internal Pelvic Floor  soft tissue work to the perinela body, bulbocavernosus, levator ani, and urethra sphincter               PT Short Term Goals - 02/20/19 1610      PT SHORT TERM GOAL #1   Title  independent with initial HEP    Time  4    Period  Weeks    Status  Achieved    Target Date  12/20/18      PT SHORT TERM GOAL #2   Title  understand how to massage the pelvic floor to improve tissue mobility    Time  4    Period  Weeks    Status  Achieved    Target Date  12/20/18      PT SHORT TERM GOAL #3   Title  pelvic floor strength is 2/5 due to improved tissue mobilty    Time  4    Period  Weeks    Status  Achieved      PT SHORT TERM GOAL #4   Title  lower abdominal pain decreased >/= 2/10 due to improve tissue mobilty    Time  4    Period  Weeks    Status  Achieved    Target Date  12/20/18        PT Long Term Goals - 04/10/19 0907      PT LONG TERM GOAL #1   Title  independent with HEP and understand how to progress herself    Baseline  still learning and progressing her eduction    Time  12    Period  Weeks    Status  On-going      PT LONG TERM GOAL #2   Title  pain with penile penetration decreased >/= 0-1/10 due to improve tissue mobility    Baseline  70% better; 4/10    Time  12    Period  Weeks    Status  On-going      PT LONG TERM GOAL #3   Title  urinary leakage decreased to none to minimal with sneezng and coughing and not have to wear a pad, pelvic floor strength 3/5    Baseline  leakage with sneezng and coughing wearing a 1 pad per day; pelvic floor strength 2/5; leakage is 40% better    Time  12    Period  Weeks    Status  On-going      PT LONG TERM GOAL #4   Title  lower abdominal pain with sitting decreased >/= 0-1/10    Baseline  decreased by 40%; now is 3/10    Time  12    Period  Weeks    Status  On-going            Plan - 04/10/19 0810    Clinical Impression Statement  Patient is wearing 1 pad per  day with 50% dryer and 1 pad per night. Patient had no pain after therapy. Patient Abodminal pain is now 3/10 instead of 4/10. Patient has tightness in the perineal body and bulbocavernosus. Patient needs tactile cues to to facilitate pelvic floor contraction. Patient had tight hips but after manual work the hips were loser. Patient will benefit from skilled therapy to reduce urinary leakage, improve tissue mobility and strength.    Personal Factors and Comorbidities  Sex;Comorbidity 1    Comorbidities  interstitial cystitis    Examination-Activity Limitations  Toileting;Continence    Rehab Potential  Excellent    PT Frequency  1x / week    PT Duration  12 weeks    PT Treatment/Interventions  Biofeedback;Cryotherapy;Electrical Stimulation;Moist Heat;Ultrasound;Therapeutic activities;Therapeutic exercise;Neuromuscular re-education;Manual techniques;Patient/family education;Dry needling;Passive range of motion    PT Next Visit Plan  soft tissue work to abdomen and pelvic floor,   work on pelvic floor contraction and internal work; renewal in 2 visits    PT Home Exercise Plan  Access Code: [email or web address]    Consulted and Agree with Plan of Care  Patient       Patient will benefit from skilled therapeutic intervention in order to improve the following deficits and impairments:  Increased fascial restricitons, Pain, Decreased coordination,  Decreased mobility, Increased muscle spasms, Decreased activity tolerance, Decreased endurance, Decreased range of motion, Decreased strength  Visit Diagnosis: Other muscle spasm  Other lack of coordination  Muscle weakness (generalized)  Interstitial cystitis     Problem List Patient Active Problem List   Diagnosis Date Noted  . GERD without esophagitis 08/31/2018  . Wheezing 08/31/2018  . Depression, major, recurrent ([place]) 04/25/2018  . Generalized anxiety disorder 04/04/2018  . Pyelonephritis 11/04/2017  . Postoperative upper abdominal pain 10/31/2017   . Prediabetes 05/30/2017  . Vitamin D deficiency 05/30/2017  . Other hyperlipidemia 05/30/2017  . Class 1 obesity with serious comorbidity and body mass index (BMI) of 31.0 to 31.9 in adult 05/30/2017  . Migraine without aura and without status migrainosus, not intractable 05/09/2017  . Other fatigue 03/13/2017  . Hyperglycemia 03/13/2017  . Class 1 obesity with serious comorbidity and body mass index (BMI) of 33.0 to 33.9 in adult 03/13/2017  . Overactive bladder 11/14/2015  . Syncope and collapse 09/27/2015  . Nephrolithiasis 09/27/2015  . Acute pyelonephritis 09/27/2015  . IC (interstitial cystitis) 09/27/2015  . Leukocytosis   . Vesico-ureteral reflux 09/15/2014  . History of kidney stones 07/28/2014  . Syncope 03/20/2012    Earlie Counts, PT 04/10/19 9:09 AM   [facility] [address] [place], [place], [address] Phone: [phone]   Fax:  [phone]  Name: SELIA WAREING MRN: [id number] Date of Birth: [birth year]

## 2019-04-17 ENCOUNTER — Encounter: Payer: Self-pay | Admitting: Physical Therapy

## 2019-04-17 ENCOUNTER — Ambulatory Visit: Payer: Medicaid Other | Admitting: Physical Therapy

## 2019-04-17 ENCOUNTER — Other Ambulatory Visit: Payer: Self-pay

## 2019-04-17 DIAGNOSIS — M62838 Other muscle spasm: Secondary | ICD-10-CM

## 2019-04-17 DIAGNOSIS — N3010 Interstitial cystitis (chronic) without hematuria: Secondary | ICD-10-CM | POA: Diagnosis not present

## 2019-04-17 DIAGNOSIS — M6281 Muscle weakness (generalized): Secondary | ICD-10-CM

## 2019-04-17 DIAGNOSIS — R278 Other lack of coordination: Secondary | ICD-10-CM | POA: Diagnosis not present

## 2019-04-17 NOTE — Therapy (Signed)
[facility] [address] [place], [place], [address] Phone: [phone]   Fax:  [phone]  Physical Therapy Treatment  Patient Details  Name: Lauren Cardenas MRN: [id number] Date of Birth: [birth year] Referring Provider (PT): Azucena Fallen, [facility]   Encounter Date: 04/17/2019  PT End of Session - 04/17/19 0807    Visit Number  13    Date for PT Re-Evaluation  05/06/19    Authorization Type  Medicaid    Authorization Time Period  02/04/19-04/28/2019    Authorization - Visit Number  9    Authorization - Number of Visits  12    PT Start Time  0800    PT Stop Time  0840    PT Time Calculation (min)  40 min    Activity Tolerance  Patient tolerated treatment well;No increased pain    Behavior During Therapy  WFL for tasks assessed/performed       Past Medical History:  Diagnosis Date  . Chronic migraine    neurologist-  dr Jaynee Eagles  . Constipation   . Depression 05/30/2017   with emotional eating  . Dysuria   . GERD (gastroesophageal reflux disease)   . History of acute pyelonephritis    09-27-2015:   11-04-2017  w/ sepsis due to obstruction from kidney stone  . History of kidney stones   . IC (interstitial cystitis)   . Interstitial cystitis   . Left ureteral stone   . OAB (overactive bladder)   . Pre-diabetes   . Urge urinary incontinence   . Urgency of urination   . Urinary reflux   . Vesicoureteral reflux     Past Surgical History:  Procedure Laterality Date  . CHOLECYSTECTOMY N/A 10/26/2017   Procedure: LAPAROSCOPIC CHOLECYSTECTOMY WITH INTRAOPERATIVE CHOLANGIOGRAM ERAS PATHWAY;  Surgeon: Erroll Luna, MD;  Location: [place];  Service: General;  Laterality: N/A;  . CYSTOSCOPY W/ URETERAL STENT PLACEMENT Right 09/27/2015   Procedure: CYSTOSCOPY WITH RETROGRADE PYELOGRAM/URETERAL STENT PLACEMENT;  Surgeon: Nickie Retort, MD;  [facility];  Service: Urology;  Laterality: Right;  . CYSTOSCOPY WITH  RETROGRADE PYELOGRAM, URETEROSCOPY AND STENT PLACEMENT Left 09/09/2014   Procedure: CYSTOSCOPY WITH LEFT RETROGRADE PYELOGRAM, URETEROSCOPY AND STONE EXTRACTION  DOUBLE J STENT PLACEMENT;  Surgeon: Festus Aloe, MD;  Location[facility];  Service: Urology;  Laterality: Left;  . CYSTOSCOPY WITH STENT PLACEMENT Left 11/04/2017   Procedure: CYSTOSCOPY WITH RETROGRADE AND LEFT STENT PLACEMENT;  Surgeon: Lucas Mallow, MD;  [facility];  Service: Urology;  Laterality: Left;  . CYSTOSCOPY/URETEROSCOPY/HOLMIUM LASER/STENT PLACEMENT Right 10/16/2015   Procedure: RIGHT URETEROSCOPY/HOLMIUM LASER/STENT PLACEMENT;  Surgeon: Festus Aloe, MD;  Location: [facility];  Service: Urology;  Laterality: Right;  . CYSTOSCOPY/URETEROSCOPY/HOLMIUM LASER/STENT PLACEMENT Left 11/21/2017   Procedure: CYSTOSCOPY/URETEROSCOPY/HOLMIUM LASER/STENT PLACEMENT;  Surgeon: Festus Aloe, MD;  Location: [facility];  Service: Urology;  Laterality: Left;  ONLY NEEDS 60 MIN  . HOLMIUM LASER APPLICATION Left 07/02/2692   Procedure: HOLMIUM LASER APPLICATION;  Surgeon: Festus Aloe, MD;  [facility];  Service: Urology;  Laterality: Left;  . HOLMIUM LASER APPLICATION Right 8/54/6270   Procedure: HOLMIUM LASER APPLICATION;  Surgeon: Festus Aloe, MD;  Location: [facility];  Service: Urology;  Laterality: Right;  . STONE EXTRACTION WITH BASKET Right 10/16/2015   Procedure: STONE EXTRACTION WITH BASKET;  Surgeon: Festus Aloe, MD;  Location: [facility];  Service: Urology;  Laterality: Right;  . WISDOM TOOTH EXTRACTION  03/2014  There were no vitals filed for this visit.  Subjective Assessment - 04/17/19 0808    Subjective  lI have pain 25% of the day and it is not constant. I did not wear a pad yesterday. WEars a pad 3 times per week. Ater urinating wait 20 seconds and then fully empty bladder.    Patient Stated Goals  less pain, be able to  fully empty her bladder    Currently in Pain?  Yes    Pain Score  2     Pain Location  Abdomen    Pain Orientation  Mid    Pain Descriptors / Indicators  Aching;Cramping;Sore    Pain Type  Chronic pain    Pain Onset  More than a month ago    Pain Frequency  Intermittent    Aggravating Factors   sitting, urinating, intercourse with initial penetration    Pain Relieving Factors  uses a donut pillow to sit, heat    Multiple Pain Sites  No                    Pelvic Floor Special Questions - 04/17/19 0001    Pelvic Floor Internal Exam  Patient confirms identification and approves PT to assess the pelvic floor and treatment    Exam Type  Vaginal    Strength  fair squeeze, definite lift   very small lift       [facility] Adult PT Treatment/Exercise - 04/17/19 0001      Neuro Re-ed    Neuro Re-ed Details   deep breathing with pulling belly in to facilitate the lower abdominal and pelvic floor to contract. Patient needed many tactile and verbal cues. using 2 fingers in the vaginal canal to give extra pressure for facililtating the pelvi cfloor contraction      Lumbar Exercises: Stretches   Quadruped Mid Back Stretch  3 reps;60 seconds    Quadruped Mid Back Stretch Limitations  all three directions with therapist placing overpressure anterior to coccyx and superior transverse perineum    Quad Stretch  Right;Left;60 seconds    Quad Stretch Limitations  using foam roll    ITB Stretch  Right;Left;1 rep;60 seconds   using foam roll   ITB Stretch Limitations  using foam roll    Piriformis Stretch  Right;Left;1 rep;60 seconds    Piriformis Stretch Limitations  using the foam roll      Lumbar Exercises: Aerobic   Nustep  6 minutes; level 4 while assessing patient      Manual Therapy   Manual Therapy  Internal Pelvic Floor    Internal Pelvic Floor  bil. levator ani, introitus, urethra sphincter               PT Short Term Goals - 02/20/19 0823      PT SHORT TERM GOAL  #1   Title  independent with initial HEP    Time  4    Period  Weeks    Status  Achieved    Target Date  12/20/18      PT SHORT TERM GOAL #2   Title  understand how to massage the pelvic floor to improve tissue mobility    Time  4    Period  Weeks    Status  Achieved    Target Date  12/20/18      PT SHORT TERM GOAL #3   Title  pelvic floor strength is 2/5 due to improved tissue mobilty    Time  4    Period  Weeks    Status  Achieved      PT SHORT TERM GOAL #4   Title  lower abdominal pain decreased >/= 2/10 due to improve tissue mobilty    Time  4    Period  Weeks    Status  Achieved    Target Date  12/20/18        PT Long Term Goals - 04/10/19 0907      PT LONG TERM GOAL #1   Title  independent with HEP and understand how to progress herself    Baseline  still learning and progressing her eduction    Time  12    Period  Weeks    Status  On-going      PT LONG TERM GOAL #2   Title  pain with penile penetration decreased >/= 0-1/10 due to improve tissue mobility    Baseline  70% better; 4/10    Time  12    Period  Weeks    Status  On-going      PT LONG TERM GOAL #3   Title  urinary leakage decreased to none to minimal with sneezng and coughing and not have to wear a pad, pelvic floor strength 3/5    Baseline  leakage with sneezng and coughing wearing a 1 pad per day; pelvic floor strength 2/5; leakage is 40% better    Time  12    Period  Weeks    Status  On-going      PT LONG TERM GOAL #4   Title  lower abdominal pain with sitting decreased >/= 0-1/10    Baseline  decreased by 40%; now is 3/10    Time  12    Period  Weeks    Status  On-going            Plan - 04/17/19 0807    Clinical Impression Statement  Patient pain is 25% of the day. Patient has gone 1 day without a pad. Patient wears a pad at night 3 times per week. Patient is able to fully empty her bladder if she sits on the commode for an extra 20 seconds. Patient reports pain with intercourse  is 75% better. Patient continues to need tactile cues to facilitate a pelvic floor contraction. Patient will benefit from skilled therapy to reduce urinary leakage, improve tissue strength and mobility.    Personal Factors and Comorbidities  Sex;Comorbidity 1    Comorbidities  interstitial cystitis    Examination-Activity Limitations  Toileting;Continence    Rehab Potential  Excellent    PT Frequency  1x / week    PT Duration  12 weeks    PT Treatment/Interventions  Biofeedback;Cryotherapy;Electrical Stimulation;Moist Heat;Ultrasound;Therapeutic activities;Therapeutic exercise;Neuromuscular re-education;Manual techniques;Patient/family education;Dry needling;Passive range of motion    PT Next Visit Plan  soft tissue work to abdomen and pelvic floor,   work on pelvic floor contraction and internal work; renewal next visit    PT Home Exercise Plan  Access Code: [email or web address]    Consulted and Agree with Plan of Care  Patient       Patient will benefit from skilled therapeutic intervention in order to improve the following deficits and impairments:  Increased fascial restricitons, Pain, Decreased coordination, Decreased mobility, Increased muscle spasms, Decreased activity tolerance, Decreased endurance, Decreased range of motion, Decreased strength  Visit Diagnosis: Other muscle spasm  Other lack of coordination  Muscle weakness (generalized)  Interstitial cystitis     Problem List Patient  Active Problem List   Diagnosis Date Noted  . GERD without esophagitis 08/31/2018  . Wheezing 08/31/2018  . Depression, major, recurrent ([facility]) 04/25/2018  . Generalized anxiety disorder 04/04/2018  . Pyelonephritis 11/04/2017  . Postoperative upper abdominal pain 10/31/2017  . Prediabetes 05/30/2017  . Vitamin D deficiency 05/30/2017  . Other hyperlipidemia 05/30/2017  . Class 1 obesity with serious comorbidity and body mass index (BMI) of 31.0 to 31.9 in adult 05/30/2017  . Migraine without aura  and without status migrainosus, not intractable 05/09/2017  . Other fatigue 03/13/2017  . Hyperglycemia 03/13/2017  . Class 1 obesity with serious comorbidity and body mass index (BMI) of 33.0 to 33.9 in adult 03/13/2017  . Overactive bladder 11/14/2015  . Syncope and collapse 09/27/2015  . Nephrolithiasis 09/27/2015  . Acute pyelonephritis 09/27/2015  . IC (interstitial cystitis) 09/27/2015  . Leukocytosis   . Vesico-ureteral reflux 09/15/2014  . History of kidney stones 07/28/2014  . Syncope 03/20/2012    Eulis Fosterheryl Izabel Chim, PT 04/17/19 8:46 AM   [facility] [address], [address] [place], [place], [address] Phone: [phone]   Fax:  [phone]  Name: Lauren Cardenas MRN: [id number] Date of Birth: Oct 01, 1985

## 2019-04-24 ENCOUNTER — Other Ambulatory Visit: Payer: Self-pay

## 2019-04-24 ENCOUNTER — Ambulatory Visit: Payer: Medicaid Other | Admitting: Physical Therapy

## 2019-04-24 ENCOUNTER — Encounter: Payer: Self-pay | Admitting: Physical Therapy

## 2019-04-24 DIAGNOSIS — R278 Other lack of coordination: Secondary | ICD-10-CM

## 2019-04-24 DIAGNOSIS — N3010 Interstitial cystitis (chronic) without hematuria: Secondary | ICD-10-CM

## 2019-04-24 DIAGNOSIS — M62838 Other muscle spasm: Secondary | ICD-10-CM

## 2019-04-24 DIAGNOSIS — M6281 Muscle weakness (generalized): Secondary | ICD-10-CM | POA: Diagnosis not present

## 2019-04-24 NOTE — Therapy (Addendum)
[facility] [address] [place], [place], [address] Phone: [phone]   Fax:  [phone]  Physical Therapy Treatment  Patient Details  Name: Lauren Cardenas MRN: [id number] Date of Birth: [birth year] Referring Provider (PT): Azucena Fallen, [facility]   Encounter Date: 04/24/2019  PT End of Session - 04/24/19 0815    Visit Number  [id number]    Date for PT Re-Evaluation  06/26/19    Authorization Type  Medicaid    Authorization - Visit Number  10    Authorization - Number of Visits  12    PT Start Time  0800    PT Stop Time  0840    PT Time Calculation (min)  40 min    Activity Tolerance  Patient tolerated treatment well;No increased pain    Behavior During Therapy  WFL for tasks assessed/performed       Past Medical History:  Diagnosis Date  . Chronic migraine    neurologist-  dr Jaynee Eagles  . Constipation   . Depression 05/30/2017   with emotional eating  . Dysuria   . GERD (gastroesophageal reflux disease)   . History of acute pyelonephritis    09-27-2015:   11-04-2017  w/ sepsis due to obstruction from kidney stone  . History of kidney stones   . IC (interstitial cystitis)   . Interstitial cystitis   . Left ureteral stone   . OAB (overactive bladder)   . Pre-diabetes   . Urge urinary incontinence   . Urgency of urination   . Urinary reflux   . Vesicoureteral reflux     Past Surgical History:  Procedure Laterality Date  . CHOLECYSTECTOMY N/A 10/26/2017   Procedure: LAPAROSCOPIC CHOLECYSTECTOMY WITH INTRAOPERATIVE CHOLANGIOGRAM ERAS PATHWAY;  Surgeon: Erroll Luna, MD;  Location: [facility];  Service: General;  Laterality: N/A;  . CYSTOSCOPY W/ URETERAL STENT PLACEMENT Right 09/27/2015   Procedure: CYSTOSCOPY WITH RETROGRADE PYELOGRAM/URETERAL STENT PLACEMENT;  Surgeon: Nickie Retort, MD;  Location: [facility];  Service: Urology;  Laterality: Right;  . CYSTOSCOPY WITH RETROGRADE PYELOGRAM, URETEROSCOPY AND STENT  PLACEMENT Left 09/09/2014   Procedure: CYSTOSCOPY WITH LEFT RETROGRADE PYELOGRAM, URETEROSCOPY AND STONE EXTRACTION  DOUBLE J STENT PLACEMENT;  Surgeon: Festus Aloe, MD;  Location: [facility];  Service: Urology;  Laterality: Left;  . CYSTOSCOPY WITH STENT PLACEMENT Left 11/04/2017   Procedure: CYSTOSCOPY WITH RETROGRADE AND LEFT STENT PLACEMENT;  Surgeon: Lucas Mallow, MD;  Location: [facility];  Service: Urology;  Laterality: Left;  . CYSTOSCOPY/URETEROSCOPY/HOLMIUM LASER/STENT PLACEMENT Right 10/16/2015   Procedure: RIGHT URETEROSCOPY/HOLMIUM LASER/STENT PLACEMENT;  Surgeon: Festus Aloe, MD;  Location: [facility];  Service: Urology;  Laterality: Right;  . CYSTOSCOPY/URETEROSCOPY/HOLMIUM LASER/STENT PLACEMENT Left 11/21/2017   Procedure: CYSTOSCOPY/URETEROSCOPY/HOLMIUM LASER/STENT PLACEMENT;  Surgeon: Festus Aloe, MD;  Location: [facility];  Service: Urology;  Laterality: Left;  ONLY NEEDS 60 MIN  . HOLMIUM LASER APPLICATION Left 06/28/5850   Procedure: HOLMIUM LASER APPLICATION;  Surgeon: Festus Aloe, MD;  Location: [facility];  Service: Urology;  Laterality: Left;  . HOLMIUM LASER APPLICATION Right 7/78/2423   Procedure: HOLMIUM LASER APPLICATION;  Surgeon: Festus Aloe, MD;  Location: [facility];  Service: Urology;  Laterality: Right;  . STONE EXTRACTION WITH BASKET Right 10/16/2015   Procedure: STONE EXTRACTION WITH BASKET;  Surgeon: Festus Aloe, MD;  Location: [facility];  Service: Urology;  Laterality: Right;  . WISDOM TOOTH EXTRACTION  03/2014    There were no vitals filed  for this visit.  Subjective Assessment - 04/24/19 0805    Subjective  I would like to continue with therapy.    Patient Stated Goals  less pain, be able to fully empty her bladder    Currently in Pain?  Yes    Pain Score  1     Pain Location  Abdomen    Pain Orientation  Mid    Pain Descriptors / Indicators  Aching;Cramping;Sore     Pain Type  Chronic pain    Pain Onset  More than a month ago    Pain Frequency  Intermittent    Aggravating Factors   sitting, eating wrong foods, penile penetration    Pain Relieving Factors  uses donut pillow to sit, heat, eating right foods    Multiple Pain Sites  No         [facility] PT Assessment - 04/24/19 0001      Assessment   Medical Diagnosis  R10.2 Pelvic pain; R30.0 Dysuria    Referring Provider (PT)  Vianne Bulls, [facility]    Onset Date/Surgical Date  04/27/18    Prior Therapy  none      Precautions   Precautions  None      Restrictions   Weight Bearing Restrictions  No      Home Environment   Living Environment  Private residence      Prior Function   Level of Independence  Independent    Vocation  Full time employment    Vocation Requirements  desk job with sitting    Leisure  no exercise      Cognition   Overall Cognitive Status  Within Functional Limits for tasks assessed      Posture/Postural Control   Posture/Postural Control  Postural limitations    Postural Limitations  Rounded Shoulders;Forward head;Decreased lumbar lordosis      AROM   Lumbar Extension  full    Lumbar - Right Side Bend  full    Lumbar - Left Side Bend  full      PROM   Overall PROM Comments  passive left hip flexion full with no pain    Right Hip External Rotation   55    Left Hip External Rotation   50      Strength   Right Hip Extension  5/5    Right Hip External Rotation   5/5    Left Hip Extension  5/5      Palpation   Palpation comment  no tenderness located in the abdomen and suprapubically                Pelvic Floor Special Questions - 04/24/19 0001    Currently Sexually Active  Yes    Is this Painful  Yes   pain level 2/10   Marinoff Scale  discomfort that does not affect completion    Pad use  1   not using them daily   Activities that cause leaking  Coughing;Sneezing    Urinary frequency  2-3 hours    Fecal incontinence  No    Pelvic Floor Internal  Exam  Patient confirms identification and approves PT to assess the pelvic floor and treatment    Exam Type  Vaginal    Strength  fair squeeze, definite lift    Strength # of reps  7    Strength # of seconds  5        [facility] Adult PT Treatment/Exercise - 04/24/19 0001  Lumbar Exercises: Stretches   Quadruped Mid Back Stretch  3 reps;60 seconds    Quadruped Mid Back Stretch Limitations  all three directions with therapist placing overpressure anterior to coccyx and superior transverse perineum    Quad Stretch  Right;Left;60 seconds    Quad Stretch Limitations  using foam roll    ITB Stretch  Right;Left;1 rep;60 seconds   using foam roll   ITB Stretch Limitations  using foam roll    Piriformis Stretch  Right;Left;1 rep;60 seconds    Piriformis Stretch Limitations  using the foam roll      Manual Therapy   Manual Therapy  Internal Pelvic Floor    Internal Pelvic Floor  bil. levator ani, introitus, urethra sphincter, perineal body               PT Short Term Goals - 02/20/19 9242      PT SHORT TERM GOAL #1   Title  independent with initial HEP    Time  4    Period  Weeks    Status  Achieved    Target Date  12/20/18      PT SHORT TERM GOAL #2   Title  understand how to massage the pelvic floor to improve tissue mobility    Time  4    Period  Weeks    Status  Achieved    Target Date  12/20/18      PT SHORT TERM GOAL #3   Title  pelvic floor strength is 2/5 due to improved tissue mobilty    Time  4    Period  Weeks    Status  Achieved      PT SHORT TERM GOAL #4   Title  lower abdominal pain decreased >/= 2/10 due to improve tissue mobilty    Time  4    Period  Weeks    Status  Achieved    Target Date  12/20/18        PT Long Term Goals - 04/24/19 0806      PT LONG TERM GOAL #1   Title  independent with HEP and understand how to progress herself    Baseline  still learning and progressing her education    Time  12    Period  Weeks    Status   On-going      PT LONG TERM GOAL #2   Title  pain with penile penetration decreased >/= 0-1/10 due to improve tissue mobility    Baseline  80% better; 2/10    Time  12    Period  Weeks    Status  On-going      PT LONG TERM GOAL #3   Title  urinary leakage decreased to none to minimal with sneezng and coughing and not have to wear a pad, pelvic floor strength 3/5    Baseline  leakage with sneezng and coughing wearing a 1 pad per day; pelvic floor strength 3/5; leakage is 60% better    Time  12    Period  Weeks    Status  On-going      PT LONG TERM GOAL #4   Title  lower abdominal pain with sitting decreased >/= 0-1/10    Baseline  decreased by 65%; now is 2/10    Time  12    Period  Weeks    Status  On-going            Plan - 04/24/19 0816    Clinical  Impression Statement  Patient has increased pelvic floor strength from 2/5 to 3/5 holding 5 seconds 7 times. Patient is wearing 1 pad several days per week due to urinary leakage decreased by 80%. Patient is able to wait 2-3 hours to urinate now due to managing the urge to urinate. Patient reports the lower abdominal pain is 2/10 and decreased by 60%. Patient has no palpable tenderness located in the abdomen or suprapubically. Pain with penile penetration is 2/10 and 70% better. Patient has full lumbar ROM. Patient hip strength is 5/5. Patient still has to sit on a cushion due to pain. Patient will benefit from skilled therapy to improve coordination to reduce leakage and further reduce pain for improved quality of life.    Personal Factors and Comorbidities  Sex;Comorbidity 1    Comorbidities  interstitial cystitis    Examination-Activity Limitations  Toileting;Continence    Rehab Potential  Excellent    PT Frequency  1x / week    PT Duration  --   9 weeks   PT Treatment/Interventions  Biofeedback;Cryotherapy;Electrical Stimulation;Moist Heat;Ultrasound;Therapeutic activities;Therapeutic exercise;Neuromuscular re-education;Manual  techniques;Patient/family education;Dry needling;Passive range of motion    PT Next Visit Plan  soft tissue work to abdomen and pelvic floor,   work on pelvic floor contraction and internal work    PT Home Exercise Plan  Access Code: [email or web address]    Recommended Other Services  sent renewal on 04/24/2019    Consulted and Agree with Plan of Care  Patient       Patient will benefit from skilled therapeutic intervention in order to improve the following deficits and impairments:  Increased fascial restricitons, Pain, Decreased coordination, Decreased mobility, Increased muscle spasms, Decreased activity tolerance, Decreased endurance, Decreased range of motion, Decreased strength  Visit Diagnosis: Other muscle spasm - Plan: PT plan of care cert/re-cert  Other lack of coordination - Plan: PT plan of care cert/re-cert  Muscle weakness (generalized) - Plan: PT plan of care cert/re-cert  Interstitial cystitis - Plan: PT plan of care cert/re-cert     Problem List Patient Active Problem List   Diagnosis Date Noted  . GERD without esophagitis 08/31/2018  . Wheezing 08/31/2018  . Depression, major, recurrent (HCC) 04/25/2018  . Generalized anxiety disorder 04/04/2018  . Pyelonephritis 11/04/2017  . Postoperative upper abdominal pain 10/31/2017  . Prediabetes 05/30/2017  . Vitamin D deficiency 05/30/2017  . Other hyperlipidemia 05/30/2017  . Class 1 obesity with serious comorbidity and body mass index (BMI) of 31.0 to 31.9 in adult 05/30/2017  . Migraine without aura and without status migrainosus, not intractable 05/09/2017  . Other fatigue 03/13/2017  . Hyperglycemia 03/13/2017  . Class 1 obesity with serious comorbidity and body mass index (BMI) of 33.0 to 33.9 in adult 03/13/2017  . Overactive bladder 11/14/2015  . Syncope and collapse 09/27/2015  . Nephrolithiasis 09/27/2015  . Acute pyelonephritis 09/27/2015  . IC (interstitial cystitis) 09/27/2015  . Leukocytosis   .  Vesico-ureteral reflux 09/15/2014  . History of kidney stones 07/28/2014  . Syncope 03/20/2012    Eulis Fosterheryl Micajah Dennin, PT 04/24/19 8:44 AM   [facility] [address], [address] [place], [place], [address] Phone: [phone]   Fax:  [phone]  Name: Mindi CurlingRachael D Cardenas MRN: [id number] Date of Birth: 10-09-1985

## 2019-05-01 ENCOUNTER — Other Ambulatory Visit: Payer: Self-pay | Admitting: Family Medicine

## 2019-05-01 ENCOUNTER — Encounter: Payer: Self-pay | Admitting: Family Medicine

## 2019-05-01 DIAGNOSIS — J0100 Acute maxillary sinusitis, unspecified: Secondary | ICD-10-CM

## 2019-05-01 MED ORDER — DOXYCYCLINE HYCLATE 100 MG PO TABS: 100.0000 mg | ORAL_TABLET | Freq: Two times a day (BID) | ORAL | 0 refills | Status: AC

## 2019-05-03 ENCOUNTER — Telehealth: Payer: Self-pay | Admitting: Family Medicine

## 2019-05-06 ENCOUNTER — Encounter: Payer: Self-pay | Admitting: Family Medicine

## 2019-05-06 ENCOUNTER — Other Ambulatory Visit: Payer: Self-pay

## 2019-05-06 ENCOUNTER — Ambulatory Visit (INDEPENDENT_AMBULATORY_CARE_PROVIDER_SITE_OTHER): Payer: Medicaid Other | Admitting: Family Medicine

## 2019-05-06 VITALS — BP 117/79 | HR 71 | Temp 98.0°F | Resp 20 | Ht 61.0 in | Wt 199.0 lb

## 2019-05-06 DIAGNOSIS — Z3041 Encounter for surveillance of contraceptive pills: Secondary | ICD-10-CM | POA: Diagnosis not present

## 2019-05-06 DIAGNOSIS — K219 Gastro-esophageal reflux disease without esophagitis: Secondary | ICD-10-CM

## 2019-05-06 DIAGNOSIS — Z6837 Body mass index (BMI) 37.0-37.9, adult: Secondary | ICD-10-CM

## 2019-05-06 DIAGNOSIS — Z0001 Encounter for general adult medical examination with abnormal findings: Secondary | ICD-10-CM

## 2019-05-06 DIAGNOSIS — Z124 Encounter for screening for malignant neoplasm of cervix: Secondary | ICD-10-CM | POA: Diagnosis not present

## 2019-05-06 DIAGNOSIS — F330 Major depressive disorder, recurrent, mild: Secondary | ICD-10-CM

## 2019-05-06 DIAGNOSIS — F411 Generalized anxiety disorder: Secondary | ICD-10-CM | POA: Diagnosis not present

## 2019-05-06 DIAGNOSIS — E559 Vitamin D deficiency, unspecified: Secondary | ICD-10-CM | POA: Diagnosis not present

## 2019-05-06 DIAGNOSIS — Z0000 Encounter for general adult medical examination without abnormal findings: Secondary | ICD-10-CM | POA: Diagnosis not present

## 2019-05-06 DIAGNOSIS — Z23 Encounter for immunization: Secondary | ICD-10-CM

## 2019-05-06 DIAGNOSIS — Z6838 Body mass index (BMI) 38.0-38.9, adult: Secondary | ICD-10-CM | POA: Insufficient documentation

## 2019-05-06 MED ORDER — LEVONORGESTREL-ETHINYL ESTRAD 0.1-20 MG-MCG PO TABS: 1.0000 | ORAL_TABLET | Freq: Every day | ORAL | 11 refills | Status: DC

## 2019-05-06 MED ORDER — FAMOTIDINE 20 MG PO TABS: 20.0000 mg | ORAL_TABLET | Freq: Two times a day (BID) | ORAL | 11 refills | Status: DC

## 2019-05-06 NOTE — Patient Instructions (Signed)
Health Maintenance, Female Adopting a healthy lifestyle and getting preventive care are important in promoting health and wellness. Ask your health care provider about:  The right schedule for you to have regular tests and exams.  Things you can do on your own to prevent diseases and keep yourself healthy. What should I know about diet, weight, and exercise? Eat a healthy diet   Eat a diet that includes plenty of vegetables, fruits, low-fat dairy products, and lean protein.  Do not eat a lot of foods that are high in solid fats, added sugars, or sodium. Maintain a healthy weight Body mass index (BMI) is used to identify weight problems. It estimates body fat based on height and weight. Your health care provider can help determine your BMI and help you achieve or maintain a healthy weight. Get regular exercise Get regular exercise. This is one of the most important things you can do for your health. Most adults should:  Exercise for at least 150 minutes each week. The exercise should increase your heart rate and make you sweat (moderate-intensity exercise).  Do strengthening exercises at least twice a week. This is in addition to the moderate-intensity exercise.  Spend less time sitting. Even light physical activity can be beneficial. Watch cholesterol and blood lipids Have your blood tested for lipids and cholesterol at 35 years of age, then have this test every 5 years. Have your cholesterol levels checked more often if:  Your lipid or cholesterol levels are high.  You are older than 35 years of age.  You are at high risk for heart disease. What should I know about cancer screening? Depending on your health history and family history, you may need to have cancer screening at various ages. This may include screening for:  Breast cancer.  Cervical cancer.  Colorectal cancer.  Skin cancer.  Lung cancer. What should I know about heart disease, diabetes, and high blood  pressure? Blood pressure and heart disease  High blood pressure causes heart disease and increases the risk of stroke. This is more likely to develop in people who have high blood pressure readings, are of African descent, or are overweight.  Have your blood pressure checked: ? Every 3-5 years if you are 18-39 years of age. ? Every year if you are 40 years old or older. Diabetes Have regular diabetes screenings. This checks your fasting blood sugar level. Have the screening done:  Once every three years after age 40 if you are at a normal weight and have a low risk for diabetes.  More often and at a younger age if you are overweight or have a high risk for diabetes. What should I know about preventing infection? Hepatitis B If you have a higher risk for hepatitis B, you should be screened for this virus. Talk with your health care provider to find out if you are at risk for hepatitis B infection. Hepatitis C Testing is recommended for:  Everyone born from 1945 through 1965.  Anyone with known risk factors for hepatitis C. Sexually transmitted infections (STIs)  Get screened for STIs, including gonorrhea and chlamydia, if: ? You are sexually active and are younger than 35 years of age. ? You are older than 35 years of age and your health care provider tells you that you are at risk for this type of infection. ? Your sexual activity has changed since you were last screened, and you are at increased risk for chlamydia or gonorrhea. Ask your health care provider if   you are at risk.  Ask your health care provider about whether you are at high risk for HIV. Your health care provider may recommend a prescription medicine to help prevent HIV infection. If you choose to take medicine to prevent HIV, you should first get tested for HIV. You should then be tested every 3 months for as long as you are taking the medicine. Pregnancy  If you are about to stop having your period (premenopausal) and  you may become pregnant, seek counseling before you get pregnant.  Take 400 to 800 micrograms (mcg) of folic acid every day if you become pregnant.  Ask for birth control (contraception) if you want to prevent pregnancy. Osteoporosis and menopause Osteoporosis is a disease in which the bones lose minerals and strength with aging. This can result in bone fractures. If you are 65 years old or older, or if you are at risk for osteoporosis and fractures, ask your health care provider if you should:  Be screened for bone loss.  Take a calcium or vitamin D supplement to lower your risk of fractures.  Be given hormone replacement therapy (HRT) to treat symptoms of menopause. Follow these instructions at home: Lifestyle  Do not use any products that contain nicotine or tobacco, such as cigarettes, e-cigarettes, and chewing tobacco. If you need help quitting, ask your health care provider.  Do not use street drugs.  Do not share needles.  Ask your health care provider for help if you need support or information about quitting drugs. Alcohol use  Do not drink alcohol if: ? Your health care provider tells you not to drink. ? You are pregnant, may be pregnant, or are planning to become pregnant.  If you drink alcohol: ? Limit how much you use to 0-1 drink a day. ? Limit intake if you are breastfeeding.  Be aware of how much alcohol is in your drink. In the [place] one drink equals one 12 oz bottle of beer (355 mL), one 5 oz glass of wine (148 mL), or one 1 oz glass of hard liquor (44 mL). General instructions  Schedule regular health, dental, and eye exams.  Stay current with your vaccines.  Tell your health care provider if: ? You often feel depressed. ? You have ever been abused or do not feel safe at home. Summary  Adopting a healthy lifestyle and getting preventive care are important in promoting health and wellness.  Follow your health care provider's instructions about healthy  diet, exercising, and getting tested or screened for diseases.  Follow your health care provider's instructions on monitoring your cholesterol and blood pressure. This information is not intended to replace advice given to you by your health care provider. Make sure you discuss any questions you have with your health care provider. Document Released: 12/27/2010 Document Revised: 06/06/2018 Document Reviewed: 06/06/2018 Elsevier Patient Education  2020 [employer]  

## 2019-05-06 NOTE — Progress Notes (Signed)
Subjective:  Patient ID: Lauren Cardenas, female    DOB: [birth year], 33 y.o.   MRN: [id number]  Patient Care Team: Baruch Gouty, FNP as PCP - General (Family Medicine)   Chief Complaint:  Annual Exam (with pap)   HPI: Lauren Cardenas is a 33 y.o. female presenting on 05/06/2019 for Annual Exam (with pap)   Pt presents today for her annual physical exam. States her GYN retired and she would like to get her PAP today. Pt states she has not had any abnormal PAPs in the past. States she has been on OCPs for several years and tolerates them well. She denies any vaginal discharge or vaginal bleeding. States she has not been sexually active in several months. No chance of pregnancy. She reports her anxiety and depression are very well controlled. States she is compliant with medications without associated side effects. She has interstitial cystitis and is followed by urology. States her symptoms have greatly improved. She reports her GERD is well controlled with pepcid. No breakthrogh symptoms. She does do self breast exams and denies abnormalities. She reports her migraines are well controlled and she has not had to take an abortive medication in several months.    Relevant past medical, surgical, family, and social history reviewed and updated as indicated.  Allergies and medications reviewed and updated. Date reviewed: Chart in Epic.   Past Medical History:  Diagnosis Date  . Chronic migraine    neurologist-  dr Jaynee Eagles  . Constipation   . Depression 05/30/2017   with emotional eating  . Dysuria   . GERD (gastroesophageal reflux disease)   . History of acute pyelonephritis    09-27-2015:   11-04-2017  w/ sepsis due to obstruction from kidney stone  . History of kidney stones   . IC (interstitial cystitis)   . Interstitial cystitis   . Left ureteral stone   . OAB (overactive bladder)   . Pre-diabetes   . Urge urinary incontinence   . Urgency of urination   . Urinary reflux    . Vesicoureteral reflux     Past Surgical History:  Procedure Laterality Date  . CHOLECYSTECTOMY N/A 10/26/2017   Procedure: LAPAROSCOPIC CHOLECYSTECTOMY WITH INTRAOPERATIVE CHOLANGIOGRAM ERAS PATHWAY;  Surgeon: Erroll Luna, MD;  Location: [place];  Service: General;  Laterality: N/A;  . CYSTOSCOPY W/ URETERAL STENT PLACEMENT Right 09/27/2015   Procedure: CYSTOSCOPY WITH RETROGRADE PYELOGRAM/URETERAL STENT PLACEMENT;  Surgeon: Nickie Retort, MD;  Location: [facility];  Service: Urology;  Laterality: Right;  . CYSTOSCOPY WITH RETROGRADE PYELOGRAM, URETEROSCOPY AND STENT PLACEMENT Left 09/09/2014   Procedure: CYSTOSCOPY WITH LEFT RETROGRADE PYELOGRAM, URETEROSCOPY AND STONE EXTRACTION  DOUBLE J STENT PLACEMENT;  Surgeon: Festus Aloe, MD;  Location: [facility];  Service: Urology;  Laterality: Left;  . CYSTOSCOPY WITH STENT PLACEMENT Left 11/04/2017   Procedure: CYSTOSCOPY WITH RETROGRADE AND LEFT STENT PLACEMENT;  Surgeon: Lucas Mallow, MD;  Location: [facility];  Service: Urology;  Laterality: Left;  . CYSTOSCOPY/URETEROSCOPY/HOLMIUM LASER/STENT PLACEMENT Right 10/16/2015   Procedure: RIGHT URETEROSCOPY/HOLMIUM LASER/STENT PLACEMENT;  Surgeon: Festus Aloe, MD;  Location: [facility];  Service: Urology;  Laterality: Right;  . CYSTOSCOPY/URETEROSCOPY/HOLMIUM LASER/STENT PLACEMENT Left 11/21/2017   Procedure: CYSTOSCOPY/URETEROSCOPY/HOLMIUM LASER/STENT PLACEMENT;  Surgeon: Festus Aloe, MD;  Location: [facility];  Service: Urology;  Laterality: Left;  ONLY NEEDS 60 MIN  . HOLMIUM LASER APPLICATION Left 6/94/5038   Procedure: HOLMIUM LASER APPLICATION;  Surgeon: Festus Aloe, MD;  Location: [facility];  Service: Urology;  Laterality: Left;  . HOLMIUM LASER APPLICATION Right 0/21/1155   Procedure: HOLMIUM LASER APPLICATION;  Surgeon: Festus Aloe, MD;  Location: [facility];  Service: Urology;  Laterality: Right;  . STONE EXTRACTION WITH BASKET  Right 10/16/2015   Procedure: STONE EXTRACTION WITH BASKET;  Surgeon: Festus Aloe, MD;  Location: [facility];  Service: Urology;  Laterality: Right;  . WISDOM TOOTH EXTRACTION  03/2014    Social History   Socioeconomic History  . Marital status: Married    Spouse name: Broadus John  . Number of children: 1  . Years of education: 57  . Highest education level: Not on file  Occupational History  . 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 Financial resource strain: Not on file  . Food insecurity    Worry: Not on file    Inability: Not on file  . Transportation needs    Medical: Not on file    Non-medical: Not on file  Tobacco Use  . Smoking status: Never Smoker  . Smokeless tobacco: Never Used  Substance and Sexual Activity  . Alcohol use: No  . Drug use: No  . Sexual activity: Yes    Birth control/protection: None  Lifestyle  . Physical activity    Days per week: Not on file    Minutes per session: Not on file  . Stress: Not on file  Relationships  . Social Herbalist on phone: Not on file    Gets together: Not on file    Attends religious service: Not on file    Active member of club or organization: Not on file    Attends meetings of clubs or organizations: Not on file    Relationship status: Not on file  . Intimate partner violence    Fear of current or ex partner: Not on file    Emotionally abused: Not on file    Physically abused: Not on file    Forced sexual activity: Not on file  Other Topics Concern  . Not on file  Social History Narrative   Lives with husband and daughter and in-laws   Caffeine use: 100-131m/week   She drinks about 3 cups of diet caffeine soda per week   She has been going to [facility]   Outpatient Encounter Medications as of 05/06/2019  Medication Sig  . albuterol (PROVENTIL HFA;VENTOLIN HFA) 108 (90 Base) MCG/ACT inhaler Inhale 2 puffs into the lungs every 6 (six) hours  as needed for wheezing or shortness of breath.  .Marland KitchenbuPROPion (WELLBUTRIN SR) 200 MG 12 hr tablet Take 1 tablet (200 mg total) by mouth at bedtime.  . cetirizine (ZYRTEC) 10 MG chewable tablet Chew 10 mg by mouth daily. Per pt  . doxycycline (VIBRA-TABS) 100 MG tablet Take 1 tablet (100 mg total) by mouth 2 (two) times daily for 10 days. 1 po bid  . famotidine (PEPCID) 20 MG tablet Take 1 tablet (20 mg total) by mouth 2 (two) times daily.  . fluticasone (FLOVENT HFA) 44 MCG/ACT inhaler Inhale 1 puff into the lungs 2 (two) times daily.  .Marland Kitchenibuprofen (ADVIL,MOTRIN) 200 MG tablet Take 400 mg by mouth every 6 (six) hours as needed for mild pain.  . metFORMIN (GLUCOPHAGE) 500 MG tablet TAKE 1 TABLET DAILY  . Misc Natural Products (CRANBERRY/PROBIOTIC) TABS Take 1 tablet by mouth at bedtime.  . Multiple Vitamin (MULTI-VITAMIN DAILY PO) Take 1 tablet by  mouth at bedtime.   . OMEGA-3 FATTY ACIDS PO Take by mouth daily. Per pt she was unsure of the dosage.  . propranolol ER (INDERAL LA) 120 MG 24 hr capsule Take 1 capsule (120 mg total) by mouth at bedtime. No further refills will be provided until seen in office.  . Red Yeast Rice 600 MG CAPS Take 2,400 mg by mouth 2 times daily at 12 noon and 4 pm. Per pt takes 1200 mg BID  . rizatriptan (MAXALT-MLT) 10 MG disintegrating tablet Take 1 tablet (10 mg total) by mouth 3 (three) times daily as needed for migraine.  . sertraline (ZOLOFT) 50 MG tablet Take 1 tablet (50 mg total) by mouth at bedtime.  . [DISCONTINUED] famotidine (PEPCID) 20 MG tablet Take 1 tablet (20 mg total) by mouth 2 (two) times daily for 30 days.  Marland Kitchen levonorgestrel-ethinyl estradiol (AVIANE) 0.1-20 MG-MCG tablet Take 1 tablet by mouth daily.  . Meth-Hyo-M Bl-Na Phos-Ph Sal (URIBEL) 118 MG CAPS   . [DISCONTINUED] AVIANE 0.1-20 MG-MCG tablet Take 1 tablet by mouth at bedtime.    No facility-administered encounter medications on file as of 05/06/2019.     Allergies  Allergen Reactions  .  Sudafed [Pseudoephedrine Hcl] Shortness Of Breath  . Amoxicillin Hives    ++ got ceftriaxone in the past++ Has patient had a PCN reaction causing immediate rash, facial/tongue/throat swelling, SOB or lightheadedness with hypotension: Yes Has patient had a PCN reaction causing severe rash involving mucus membranes or skin necrosis: No Has patient had a PCN reaction that required hospitalization: No Has patient had a PCN reaction occurring within the last 10 years: Yes If all of the above answers are "NO", then may proceed with Cephalosporin use.     Review of Systems  Constitutional: Negative for activity change, appetite change, chills, diaphoresis, fatigue, fever and unexpected weight change.  HENT: Negative.   Eyes: Negative.  Negative for photophobia and visual disturbance.  Respiratory: Negative for cough, chest tightness and shortness of breath.   Cardiovascular: Negative for chest pain, palpitations and leg swelling.  Gastrointestinal: Negative for abdominal distention, abdominal pain, anal bleeding, blood in stool, constipation, diarrhea, nausea, rectal pain and vomiting.  Endocrine: Negative.  Negative for cold intolerance, heat intolerance, polydipsia, polyphagia and polyuria.  Genitourinary: Positive for frequency. Negative for decreased urine volume, difficulty urinating, dysuria, flank pain, hematuria, menstrual problem, pelvic pain, urgency, vaginal bleeding, vaginal discharge and vaginal pain.  Musculoskeletal: Negative for arthralgias and myalgias.  Skin: Negative.   Allergic/Immunologic: Negative.   Neurological: Negative for dizziness, tremors, seizures, syncope, facial asymmetry, speech difficulty, weakness, light-headedness, numbness and headaches.  Hematological: Negative.  Does not bruise/bleed easily.  Psychiatric/Behavioral: Negative for confusion, hallucinations, sleep disturbance and suicidal ideas.  All other systems reviewed and are negative.       Objective:   BP 117/79   Pulse 71   Temp 98 F (36.7 C)   Resp 20   Ht '5\' 1"'$  (1.549 m)   Wt 199 lb (90.3 kg)   LMP 04/01/2019   SpO2 95%   BMI 37.60 kg/m    Wt Readings from Last 3 Encounters:  05/06/19 199 lb (90.3 kg)  01/03/19 195 lb 7 oz (88.6 kg)  12/11/18 196 lb 12.8 oz (89.3 kg)    Physical Exam Vitals signs and nursing note reviewed. Exam conducted with a chaperone present.  Constitutional:      General: She is not in acute distress.    Appearance: Normal appearance. She is well-developed  and well-groomed. She is obese. She is not ill-appearing, toxic-appearing or diaphoretic.  HENT:     Head: Normocephalic and atraumatic.     Jaw: There is normal jaw occlusion.     Right Ear: Hearing, tympanic membrane, ear canal and external ear normal.     Left Ear: Hearing, tympanic membrane, ear canal and external ear normal.     Nose: Nose normal.     Mouth/Throat:     Lips: Pink.     Mouth: Mucous membranes are moist.     Pharynx: Oropharynx is clear. Uvula midline.  Eyes:     General: Lids are normal. Vision grossly intact.     Extraocular Movements: Extraocular movements intact.     Conjunctiva/sclera: Conjunctivae normal.     Pupils: Pupils are equal, round, and reactive to light.  Neck:     Musculoskeletal: Normal range of motion and neck supple.     Thyroid: No thyroid mass, thyromegaly or thyroid tenderness.     Vascular: No carotid bruit or JVD.     Trachea: Trachea and phonation normal.  Cardiovascular:     Rate and Rhythm: Normal rate and regular rhythm.     Chest Wall: PMI is not displaced.     Pulses: Normal pulses.     Heart sounds: Normal heart sounds. No murmur. No friction rub. No gallop.   Pulmonary:     Effort: Pulmonary effort is normal. No respiratory distress.     Breath sounds: Normal breath sounds. No wheezing.  Chest:     Chest wall: No mass, lacerations, deformity, swelling, tenderness, crepitus or edema. There is no dullness to percussion.     Breasts:  Tanner Score is 5. Breasts are symmetrical.        Right: Normal.        Left: Normal.  Abdominal:     General: Abdomen is protuberant. Bowel sounds are normal. There is no distension or abdominal bruit.     Palpations: Abdomen is soft. There is no hepatomegaly or splenomegaly.     Tenderness: There is no abdominal tenderness. There is no right CVA tenderness or left CVA tenderness.     Hernia: No hernia is present. There is no hernia in the left inguinal area or right inguinal area.  Genitourinary:    General: Normal vulva.     Exam position: Lithotomy position.     Pubic Area: No rash or pubic lice.      Tanner stage (genital): 5.     Labia:        Right: No rash, tenderness, lesion or injury.        Left: No rash, tenderness, lesion or injury.      Urethra: No prolapse, urethral pain, urethral swelling or urethral lesion.     Vagina: Normal.     Cervix: Normal.     Uterus: Normal.      Adnexa: Left adnexa normal.     Rectum: Normal.  Musculoskeletal: Normal range of motion.     Right lower leg: No edema.     Left lower leg: No edema.  Lymphadenopathy:     Cervical: No cervical adenopathy.     Upper Body:     Right upper body: No supraclavicular, axillary or pectoral adenopathy.     Left upper body: No supraclavicular, axillary or pectoral adenopathy.     Lower Body: No right inguinal adenopathy. No left inguinal adenopathy.  Skin:    General: Skin is warm and dry.  Capillary Refill: Capillary refill takes less than 2 seconds.     Coloration: Skin is not cyanotic, jaundiced or pale.     Findings: No rash.  Neurological:     General: No focal deficit present.     Mental Status: She is alert and oriented to person, place, and time.     Cranial Nerves: Cranial nerves are intact. No cranial nerve deficit.     Sensory: Sensation is intact. No sensory deficit.     Motor: Motor function is intact. No weakness.     Coordination: Coordination is intact. Coordination normal.      Gait: Gait is intact. Gait normal.     Deep Tendon Reflexes: Reflexes are normal and symmetric. Reflexes normal.  Psychiatric:        Attention and Perception: Attention and perception normal.        Mood and Affect: Mood and affect normal.        Speech: Speech normal.        Behavior: Behavior normal. Behavior is cooperative.        Thought Content: Thought content normal.        Cognition and Memory: Cognition and memory normal.        Judgment: Judgment normal.     Results for orders placed or performed in visit on 12/27/18  CBC with Differential/Platelet  Result Value Ref Range   WBC 12.1 (H) 4.0 - 10.5 K/uL   RBC 4.73 3.87 - 5.11 MIL/uL   Hemoglobin 12.2 12.0 - 15.0 g/dL   HCT 39.8 36.0 - 46.0 %   MCV 84.1 80.0 - 100.0 fL   MCH 25.8 (L) 26.0 - 34.0 pg   MCHC 30.7 30.0 - 36.0 g/dL   RDW 13.9 11.5 - 15.5 %   Platelets 275 150 - 400 K/uL   nRBC 0.0 0.0 - 0.2 %   Neutrophils Relative % 67 %   Neutro Abs 8.1 (H) 1.7 - 7.7 K/uL   Lymphocytes Relative 23 %   Lymphs Abs 2.8 0.7 - 4.0 K/uL   Monocytes Relative 5 %   Monocytes Absolute 0.6 0.1 - 1.0 K/uL   Eosinophils Relative 3 %   Eosinophils Absolute 0.4 0.0 - 0.5 K/uL   Basophils Relative 1 %   Basophils Absolute 0.1 0.0 - 0.1 K/uL   Immature Granulocytes 1 %   Abs Immature Granulocytes 0.06 0.00 - 0.07 K/uL  Comprehensive metabolic panel  Result Value Ref Range   Sodium 141 135 - 145 mmol/L   Potassium 4.6 3.5 - 5.1 mmol/L   Chloride 104 98 - 111 mmol/L   CO2 25 22 - 32 mmol/L   Glucose, Bld 112 (H) 70 - 99 mg/dL   BUN 15 6 - 20 mg/dL   Creatinine, Ser 0.84 0.44 - 1.00 mg/dL   Calcium 9.3 8.9 - 10.3 mg/dL   Total Protein 7.3 6.5 - 8.1 g/dL   Albumin 3.6 3.5 - 5.0 g/dL   AST 16 15 - 41 U/L   ALT 17 0 - 44 U/L   Alkaline Phosphatase 38 38 - 126 U/L   Total Bilirubin 0.3 0.3 - 1.2 mg/dL   GFR calc non Af Amer >60 >60 mL/min   GFR calc Af Amer >60 >60 mL/min   Anion gap 12 5 - 15  Lactate dehydrogenase  Result  Value Ref Range   LDH 101 98 - 192 U/L  Vitamin D 25 hydroxy  Result Value Ref Range   Vit D, 25-Hydroxy 30.9  30.0 - 100.0 ng/mL       Pertinent labs & imaging results that were available during my care of the patient were reviewed by me and considered in my medical decision making.  Assessment & Plan:  Jaclynne was seen today for annual exam.  Diagnoses and all orders for this visit:  Annual physical exam Health maintenance discussed. Diet and exercise encouraged. Labs pending.  -     CBC with Differential/Platelet -     CMP14+EGFR -     Lipid panel -     Thyroid Panel With TSH -     Vitamin D 25 hydroxy -     PAP age 38-65  Mild episode of recurrent major depressive disorder ([place]) Generalized anxiety disorder Well controlled with current medications. Will continue.  -     Thyroid Panel With TSH  GERD without esophagitis Well controlled, will continue below. Avoid triggers.  -     CBC with Differential/Platelet -     famotidine (PEPCID) 20 MG tablet; Take 1 tablet (20 mg total) by mouth 2 (two) times daily.  Vitamin D deficiency Labs pending. Continue repletion therapy. If indicated, will change repletion dosage. Eat foods rich in Vit D including milk, orange juice, yogurt with vitamin D added, salmon or mackerel, canned tuna fish, cereals with vitamin D added, and cod liver oil. Get out in the sun but make sure to wear at least SPF 30 sunscreen.  -     Vitamin D 25 hydroxy  Encounter for screening for cervical cancer  -     PAP age 51-65  BMI 37.0-37.9, adult Diet and exercise encouraged. Labs pending.  -     CBC with Differential/Platelet -     CMP14+EGFR -     Lipid panel -     Thyroid Panel With TSH -     Vitamin D 25 hydroxy  Encounter for surveillance of contraceptive pills -     levonorgestrel-ethinyl estradiol (AVIANE) 0.1-20 MG-MCG tablet; Take 1 tablet by mouth daily.     Continue all other maintenance medications.  Follow up plan: Return in about 6  months (around 11/03/2019), or if symptoms worsen or fail to improve.  Continue healthy lifestyle choices, including diet (rich in fruits, vegetables, and lean proteins, and low in salt and simple carbohydrates) and exercise (at least 30 minutes of moderate physical activity daily).  Educational handout given for health maintenance   The above assessment and management plan was discussed with the patient. The patient verbalized understanding of and has agreed to the management plan. Patient is aware to call the clinic if they develop any new symptoms or if symptoms persist or worsen. Patient is aware when to return to the clinic for a follow-up visit. Patient educated on when it is appropriate to go to the emergency department.   Monia Pouch, FNP-C [facility] [phone]

## 2019-05-07 LAB — CMP14+EGFR
ALT: 24 IU/L (ref 0–32)
AST: 23 IU/L (ref 0–40)
Albumin/Globulin Ratio: 1.5 (ref 1.2–2.2)
Albumin: 4.3 g/dL (ref 3.8–4.8)
Alkaline Phosphatase: 66 IU/L (ref 39–117)
BUN/Creatinine Ratio: 19 (ref 9–23)
BUN: 16 mg/dL (ref 6–20)
Bilirubin Total: 0.3 mg/dL (ref 0.0–1.2)
CO2: 23 mmol/L (ref 20–29)
Calcium: 9.7 mg/dL (ref 8.7–10.2)
Chloride: 103 mmol/L (ref 96–106)
Creatinine, Ser: 0.84 mg/dL (ref 0.57–1.00)
GFR calc Af Amer: 105 mL/min/{1.73_m2} (ref >59)
GFR calc non Af Amer: 91 mL/min/{1.73_m2} (ref >59)
Globulin, Total: 2.8 g/dL (ref 1.5–4.5)
Glucose: 96 mg/dL (ref 65–99)
Potassium: 5 mmol/L (ref 3.5–5.2)
Sodium: 141 mmol/L (ref 134–144)
Total Protein: 7.1 g/dL (ref 6.0–8.5)

## 2019-05-07 LAB — THYROID PANEL WITH TSH
Free Thyroxine Index: 2.9 (ref 1.2–4.9)
T3 Uptake Ratio: 28 % (ref 24–39)
T4, Total: 10.5 ug/dL (ref 4.5–12.0)
TSH: 2.53 u[IU]/mL (ref 0.450–4.500)

## 2019-05-07 LAB — CBC WITH DIFFERENTIAL/PLATELET
Basophils Absolute: 0.1 10*3/uL (ref 0.0–0.2)
Basos: 1 %
EOS (ABSOLUTE): 0.3 10*3/uL (ref 0.0–0.4)
Eos: 3 %
Hematocrit: 40.8 % (ref 34.0–46.6)
Hemoglobin: 13.5 g/dL (ref 11.1–15.9)
Immature Grans (Abs): 0.1 10*3/uL (ref 0.0–0.1)
Immature Granulocytes: 1 %
Lymphocytes Absolute: 2.7 10*3/uL (ref 0.7–3.1)
Lymphs: 24 %
MCH: 26 pg — ABNORMAL LOW (ref 26.6–33.0)
MCHC: 33.1 g/dL (ref 31.5–35.7)
MCV: 79 fL (ref 79–97)
Monocytes Absolute: 0.5 10*3/uL (ref 0.1–0.9)
Monocytes: 5 %
Neutrophils Absolute: 7.4 10*3/uL — ABNORMAL HIGH (ref 1.4–7.0)
Neutrophils: 66 %
Platelets: 264 10*3/uL (ref 150–450)
RBC: 5.19 x10E6/uL (ref 3.77–5.28)
RDW: 13.8 % (ref 11.7–15.4)
WBC: 11.2 10*3/uL — ABNORMAL HIGH (ref 3.4–10.8)

## 2019-05-07 LAB — LIPID PANEL
Chol/HDL Ratio: 3.3 ratio (ref 0.0–4.4)
Cholesterol, Total: 164 mg/dL (ref 100–199)
HDL: 49 mg/dL (ref >39)
LDL Chol Calc (NIH): 70 mg/dL (ref 0–99)
Triglycerides: 281 mg/dL — ABNORMAL HIGH (ref 0–149)
VLDL Cholesterol Cal: 45 mg/dL — ABNORMAL HIGH (ref 5–40)

## 2019-05-07 LAB — VITAMIN D 25 HYDROXY (VIT D DEFICIENCY, FRACTURES): Vit D, 25-Hydroxy: 26.7 ng/mL — ABNORMAL LOW (ref 30.0–100.0)

## 2019-05-08 ENCOUNTER — Other Ambulatory Visit: Payer: Self-pay

## 2019-05-08 ENCOUNTER — Encounter: Payer: Self-pay | Admitting: Physical Therapy

## 2019-05-08 ENCOUNTER — Ambulatory Visit: Payer: Medicaid Other | Attending: Urology | Admitting: Physical Therapy

## 2019-05-08 DIAGNOSIS — M6281 Muscle weakness (generalized): Secondary | ICD-10-CM | POA: Diagnosis not present

## 2019-05-08 DIAGNOSIS — N3010 Interstitial cystitis (chronic) without hematuria: Secondary | ICD-10-CM | POA: Insufficient documentation

## 2019-05-08 DIAGNOSIS — M62838 Other muscle spasm: Secondary | ICD-10-CM | POA: Diagnosis not present

## 2019-05-08 DIAGNOSIS — R278 Other lack of coordination: Secondary | ICD-10-CM | POA: Diagnosis not present

## 2019-05-08 NOTE — Therapy (Signed)
[facility] [address], [address] [place], [place], [address] Phone: [phone]   Fax:  [phone]  Physical Therapy Treatment  Patient Details  Name: Lauren CurlingRachael D Cardenas MRN: [id number] Date of Birth: October 09, 1985 Referring Provider (PT): Vianne BullsBree Fleck, [facility]   Encounter Date: 05/08/2019  PT End of Session - 05/08/19 0805    Visit Number  15    Date for PT Re-Evaluation  06/26/19    Authorization Type  Medicaid    Authorization Time Period  05/02/2019-06/26/2019    Authorization - Visit Number  1    Authorization - Number of Visits  8    PT Start Time  0800    PT Stop Time  0840    PT Time Calculation (min)  40 min    Activity Tolerance  Patient tolerated treatment well;No increased pain    Behavior During Therapy  WFL for tasks assessed/performed       Past Medical History:  Diagnosis Date  . Chronic migraine    neurologist-  dr Lucia Gaskinsahern  . Constipation   . Depression 05/30/2017   with emotional eating  . Dysuria   . GERD (gastroesophageal reflux disease)   . History of acute pyelonephritis    09-27-2015:   11-04-2017  w/ sepsis due to obstruction from kidney stone  . History of kidney stones   . IC (interstitial cystitis)   . Interstitial cystitis   . Left ureteral stone   . OAB (overactive bladder)   . Pre-diabetes   . Urge urinary incontinence   . Urgency of urination   . Urinary reflux   . Vesicoureteral reflux     Past Surgical History:  Procedure Laterality Date  . CHOLECYSTECTOMY N/A 10/26/2017   Procedure: LAPAROSCOPIC CHOLECYSTECTOMY WITH INTRAOPERATIVE CHOLANGIOGRAM ERAS PATHWAY;  Surgeon: Harriette Bouillonornett, Thomas, MD;  [facility];  Service: General;  Laterality: N/A;  . CYSTOSCOPY W/ URETERAL STENT PLACEMENT Right 09/27/2015   Procedure: CYSTOSCOPY WITH RETROGRADE PYELOGRAM/URETERAL STENT PLACEMENT;  Surgeon: Hildred LaserBrian James Budzyn, MD;  Location: [facility];  Service: Urology;  Laterality: Right;  . CYSTOSCOPY WITH  RETROGRADE PYELOGRAM, URETEROSCOPY AND STENT PLACEMENT Left 09/09/2014   Procedure: CYSTOSCOPY WITH LEFT RETROGRADE PYELOGRAM, URETEROSCOPY AND STONE EXTRACTION  DOUBLE J STENT PLACEMENT;  Surgeon: Jerilee FieldMatthew Eskridge, MD;  [facility];  Service: Urology;  Laterality: Left;  . CYSTOSCOPY WITH STENT PLACEMENT Left 11/04/2017   Procedure: CYSTOSCOPY WITH RETROGRADE AND LEFT STENT PLACEMENT;  Surgeon: Crista ElliotBell, Eugene D III, MD;  [facility];  Service: Urology;  Laterality: Left;  . CYSTOSCOPY/URETEROSCOPY/HOLMIUM LASER/STENT PLACEMENT Right 10/16/2015   Procedure: RIGHT URETEROSCOPY/HOLMIUM LASER/STENT PLACEMENT;  Surgeon: Jerilee FieldMatthew Eskridge, MD;  [facility] [place];  Service: Urology;  Laterality: Right;  . CYSTOSCOPY/URETEROSCOPY/HOLMIUM LASER/STENT PLACEMENT Left 11/21/2017   Procedure: CYSTOSCOPY/URETEROSCOPY/HOLMIUM LASER/STENT PLACEMENT;  Surgeon: Jerilee FieldEskridge, Matthew, MD;  [facility] [place];  Service: Urology;  Laterality: Left;  ONLY NEEDS 60 MIN  . HOLMIUM LASER APPLICATION Left 09/09/2014   Procedure: HOLMIUM LASER APPLICATION;  Surgeon: Jerilee FieldMatthew Eskridge, MD;  [facility];  Service: Urology;  Laterality: Left;  . HOLMIUM LASER APPLICATION Right 10/16/2015   Procedure: HOLMIUM LASER APPLICATION;  Surgeon: Jerilee FieldMatthew Eskridge, MD;  [facility] [place];  Service: Urology;  Laterality: Right;  . STONE EXTRACTION WITH BASKET Right 10/16/2015   Procedure: STONE EXTRACTION WITH BASKET;  Surgeon: Jerilee FieldMatthew Eskridge, MD;  [facility] [place];  Service: Urology;  Laterality: Right;  . WISDOM TOOTH EXTRACTION  03/2014  There were no vitals filed for this visit.  Subjective Assessment - 05/08/19 0807    Subjective  I feel better. I am on my cycle today.    Patient Stated Goals  less pain, be able to fully empty her bladder    Currently in Pain?  Yes    Pain Score  3     Pain Location  Abdomen    Pain Orientation  Mid    Pain  Descriptors / Indicators  Aching;Cramping;Sore    Pain Type  Chronic pain    Pain Onset  More than a month ago    Pain Frequency  Intermittent    Aggravating Factors   sitting, eating wrong foods, penile penetration, cycle    Pain Relieving Factors  still uses the donut pillow to sit, heat, eating right foods    Multiple Pain Sites  No                       [facility] Adult PT Treatment/Exercise - 05/08/19 0001      Lumbar Exercises: Stretches   Press Ups  5 reps    Quadruped Mid Back Stretch  3 reps;60 seconds   using foam roll   Quadruped Mid Back Stretch Limitations  all three directions with therapist placing overpressure anterior to coccyx and superior transverse perineum    Quad Stretch  Right;Left;60 seconds    Quad Stretch Limitations  using foam roll    ITB Stretch  Right;Left;1 rep;60 seconds   using foam roll   ITB Stretch Limitations  using foam roll    Piriformis Stretch  Right;Left;1 rep;60 seconds    Piriformis Stretch Limitations  using the foam roll      Lumbar Exercises: Aerobic   Nustep  6 minutes; level 4 while assessing patient      Lumbar Exercises: Supine   Ab Set  5 reps;5 seconds    AB Set Limitations  with tactile cues to contract the lower abdominal    Clam  10 reps;1 second   each side   Clam Limitations  tactile cues to engage the abdominals    Dead Bug  10 reps   each side   Dead Bug Limitations  VC to engage the abdominals    Bridge  15 reps    Bridge Limitations  with feet on roll    Other Supine Lumbar Exercises  lay on back with ball between legs and flex and extend knees      Lumbar Exercises: Quadruped   Opposite Arm/Leg Raise  Right arm/Left leg;Left arm/Right leg;10 reps;1 second    Other Quadruped Lumbar Exercises  quadruped with walking hands back and forth engaging the core               PT Short Term Goals - 02/20/19 0823      PT SHORT TERM GOAL #1   Title  independent with initial HEP    Time  4    Period   Weeks    Status  Achieved    Target Date  12/20/18      PT SHORT TERM GOAL #2   Title  understand how to massage the pelvic floor to improve tissue mobility    Time  4    Period  Weeks    Status  Achieved    Target Date  12/20/18      PT SHORT TERM GOAL #3   Title  pelvic floor strength is 2/5 due  to improved tissue mobilty    Time  4    Period  Weeks    Status  Achieved      PT SHORT TERM GOAL #4   Title  lower abdominal pain decreased >/= 2/10 due to improve tissue mobilty    Time  4    Period  Weeks    Status  Achieved    Target Date  12/20/18        PT Long Term Goals - 05/08/19 0841      PT LONG TERM GOAL #1   Title  independent with HEP and understand how to progress herself    Baseline  still learning and progressing her education    Time  12    Period  Weeks    Status  On-going      PT LONG TERM GOAL #2   Title  pain with penile penetration decreased >/= 0-1/10 due to improve tissue mobility    Baseline  80% better; 2/10    Time  12    Period  Weeks    Status  On-going      PT LONG TERM GOAL #3   Title  urinary leakage decreased to none to minimal with sneezng and coughing and not have to wear a pad, pelvic floor strength 3/5    Baseline  leakage with sneezng and coughing wearing a 1 pad per day; pelvic floor strength 3/5; leakage is 75% better    Time  12    Period  Weeks    Status  On-going      PT LONG TERM GOAL #4   Title  lower abdominal pain with sitting decreased >/= 0-1/10    Baseline  decreased by 65%; now is 2/10    Time  12    Period  Weeks    Status  On-going            Plan - 05/08/19 0818    Clinical Impression Statement  Patient reports her urinary leakage is 75% better. Patient is having abdominal pain due to being on her cycle. The stretches are helping her pain. This visit focused on core strength and egaging the abdominals. Patient will benefit from skilled therapy to improve coordination to reduce leakage and further reduce  pain for improved quality of life.    Personal Factors and Comorbidities  Sex;Comorbidity 1    Comorbidities  interstitial cystitis    Examination-Activity Limitations  Toileting;Continence    Rehab Potential  Excellent    PT Frequency  1x / week    PT Duration  --   9 weeks   PT Treatment/Interventions  Biofeedback;Cryotherapy;Electrical Stimulation;Moist Heat;Ultrasound;Therapeutic activities;Therapeutic exercise;Neuromuscular re-education;Manual techniques;Patient/family education;Dry needling;Passive range of motion    PT Next Visit Plan  soft tissue work to abdomen and pelvic floor,   work on pelvic floor contraction and internal work; core strength    PT Home Exercise Plan  Access Code: [email or web address]    Consulted and Agree with Plan of Care  Patient       Patient will benefit from skilled therapeutic intervention in order to improve the following deficits and impairments:  Increased fascial restricitons, Pain, Decreased coordination, Decreased mobility, Increased muscle spasms, Decreased activity tolerance, Decreased endurance, Decreased range of motion, Decreased strength  Visit Diagnosis: Other muscle spasm  Other lack of coordination  Muscle weakness (generalized)  Interstitial cystitis     Problem List Patient Active Problem List   Diagnosis Date Noted  . BMI 37.0-37.9, adult  05/06/2019  . GERD without esophagitis 08/31/2018  . Wheezing 08/31/2018  . Depression, major, recurrent (HCC) 04/25/2018  . Generalized anxiety disorder 04/04/2018  . Pyelonephritis 11/04/2017  . Postoperative upper abdominal pain 10/31/2017  . Prediabetes 05/30/2017  . Vitamin D deficiency 05/30/2017  . Other hyperlipidemia 05/30/2017  . Migraine without aura and without status migrainosus, not intractable 05/09/2017  . Other fatigue 03/13/2017  . Hyperglycemia 03/13/2017  . Overactive bladder 11/14/2015  . Syncope and collapse 09/27/2015  . Nephrolithiasis 09/27/2015  . Acute  pyelonephritis 09/27/2015  . IC (interstitial cystitis) 09/27/2015  . Leukocytosis   . Vesico-ureteral reflux 09/15/2014  . History of kidney stones 07/28/2014  . Syncope 03/20/2012    Eulis Foster, PT 05/08/19 8:43 AM   [facility] [address] [place], [place], [address] Phone: [phone]   Fax:  [phone]  Name: JARETSSI KRAKER MRN: [id number] Date of Birth: April 05, 1986

## 2019-05-12 LAB — IGP, APTIMA HPV, RFX 16/18,45: HPV Aptima: NEGATIVE

## 2019-05-15 DIAGNOSIS — R102 Pelvic and perineal pain: Secondary | ICD-10-CM | POA: Diagnosis not present

## 2019-05-15 DIAGNOSIS — N200 Calculus of kidney: Secondary | ICD-10-CM | POA: Diagnosis not present

## 2019-05-16 ENCOUNTER — Other Ambulatory Visit: Payer: Self-pay

## 2019-05-16 ENCOUNTER — Encounter: Payer: Self-pay | Admitting: Physical Therapy

## 2019-05-16 ENCOUNTER — Ambulatory Visit: Payer: Medicaid Other | Admitting: Physical Therapy

## 2019-05-16 DIAGNOSIS — M6281 Muscle weakness (generalized): Secondary | ICD-10-CM | POA: Diagnosis not present

## 2019-05-16 DIAGNOSIS — N3010 Interstitial cystitis (chronic) without hematuria: Secondary | ICD-10-CM | POA: Diagnosis not present

## 2019-05-16 DIAGNOSIS — R278 Other lack of coordination: Secondary | ICD-10-CM | POA: Diagnosis not present

## 2019-05-16 DIAGNOSIS — M62838 Other muscle spasm: Secondary | ICD-10-CM | POA: Diagnosis not present

## 2019-05-16 NOTE — Patient Instructions (Signed)
Access Code: [email or web address]  URL: [email or web address]  Date: 05/16/2019  Prepared by: Earlie Counts   Exercises Seated Piriformis Stretch with Trunk Bend - 2 reps - 1 sets - 30sec hold - 1x daily - 7x weekly Seated Hamstring Stretch - 2 reps - 1 sets - 30 sec hold - 1x daily - 7x weekly Quadruped Cat Camel - 10 reps - 1 sets - 1x daily - 7x weekly Child's Pose Stretch - 1 reps - 1 sets - 1 min hold - 1x daily - 7x weekly Childs Pose Side Bend - 2 reps - 1 sets - 30 sec hold - 1x daily - 7x weekly Prone Press Up - 2 reps - 1 sets - 5 sec hold - 1x daily - 7x weekly Supine Lower Trunk Rotation - 2 reps - 1 sets - 15 sec hold - 1x daily - 7x weekly Supine Pelvic Floor Stretch - 1 reps - 1 sets - 30 sec hold - 1x daily - 7x weekly Seated Hip Flexor Stretch - 2 reps - 1 sets - 30 sec hold - 1x daily - 7x weekly Bird Dog - 10 reps - 1 sets - 1x daily - 7x weekly Supine Bridge - 10 reps - 1 sets - 1x daily - 7x weekly Bridge with Abduction and Resistance Loop - 10 reps - 1 sets - 1x daily - 7x weekly Supine Hip Adduction Isometric with Ball - 10 reps - 1 sets - 1x daily - 7x weekly Quadruped Ambulance person - 10 reps - 1 sets - 1x daily - 7x weekly Marching Bridge - 10 reps - 1 sets - 1x daily - 7x weekly Clamshell - 10 reps - 1 sets - 1x daily - 7x weekly [facility] [address], [place], [place] [address] Phone # [phone] Fax [phone]

## 2019-05-16 NOTE — Therapy (Signed)
[facility] [address] [place], [place], [address] Phone: [phone]   Fax:  [phone]  Physical Therapy Treatment  Patient Details  Name: Lauren Cardenas MRN: [id number] Date of Birth: [birth year] Referring Provider (PT): Vianne Bulls, [facility]   Encounter Date: 05/16/2019  PT End of Session - 05/16/19 0841    Visit Number  16    Date for PT Re-Evaluation  06/26/19    Authorization Type  Medicaid    Authorization Time Period  05/02/2019-06/26/2019    Authorization - Visit Number  2    Authorization - Number of Visits  8    PT Start Time  0800    PT Stop Time  0840    PT Time Calculation (min)  40 min    Activity Tolerance  Patient tolerated treatment well;No increased pain    Behavior During Therapy  WFL for tasks assessed/performed       Past Medical History:  Diagnosis Date  . Chronic migraine    neurologist-  dr Lucia Gaskins  . Constipation   . Depression 05/30/2017   with emotional eating  . Dysuria   . GERD (gastroesophageal reflux disease)   . History of acute pyelonephritis    09-27-2015:   11-04-2017  w/ sepsis due to obstruction from kidney stone  . History of kidney stones   . IC (interstitial cystitis)   . Interstitial cystitis   . Left ureteral stone   . OAB (overactive bladder)   . Pre-diabetes   . Urge urinary incontinence   . Urgency of urination   . Urinary reflux   . Vesicoureteral reflux     Past Surgical History:  Procedure Laterality Date  . CHOLECYSTECTOMY N/A 10/26/2017   Procedure: LAPAROSCOPIC CHOLECYSTECTOMY WITH INTRAOPERATIVE CHOLANGIOGRAM ERAS PATHWAY;  Surgeon: Harriette Bouillon, MD;  [facility];  Service: General;  Laterality: N/A;  . CYSTOSCOPY W/ URETERAL STENT PLACEMENT Right 09/27/2015   Procedure: CYSTOSCOPY WITH RETROGRADE PYELOGRAM/URETERAL STENT PLACEMENT;  Surgeon: Hildred Laser, MD;  [facility];  Service: Urology;  Laterality: Right;  . CYSTOSCOPY WITH  RETROGRADE PYELOGRAM, URETEROSCOPY AND STENT PLACEMENT Left 09/09/2014   Procedure: CYSTOSCOPY WITH LEFT RETROGRADE PYELOGRAM, URETEROSCOPY AND STONE EXTRACTION  DOUBLE J STENT PLACEMENT;  Surgeon: Jerilee Field, MD;  Location[facility];  Service: Urology;  Laterality: Left;  . CYSTOSCOPY WITH STENT PLACEMENT Left 11/04/2017   Procedure: CYSTOSCOPY WITH RETROGRADE AND LEFT STENT PLACEMENT;  Surgeon: Crista Elliot, MD;  [facility];  Service: Urology;  Laterality: Left;  . CYSTOSCOPY/URETEROSCOPY/HOLMIUM LASER/STENT PLACEMENT Right 10/16/2015   Procedure: RIGHT URETEROSCOPY/HOLMIUM LASER/STENT PLACEMENT;  Surgeon: Jerilee Field, MD;  Location[facility];  Service: Urology;  Laterality: Right;  . CYSTOSCOPY/URETEROSCOPY/HOLMIUM LASER/STENT PLACEMENT Left 11/21/2017   Procedure: CYSTOSCOPY/URETEROSCOPY/HOLMIUM LASER/STENT PLACEMENT;  Surgeon: Jerilee Field, MD;  Location[facility];  Service: Urology;  Laterality: Left;  ONLY NEEDS 60 MIN  . HOLMIUM LASER APPLICATION Left 09/09/2014   Procedure: HOLMIUM LASER APPLICATION;  Surgeon: Jerilee Field, MD;  [facility];  Service: Urology;  Laterality: Left;  . HOLMIUM LASER APPLICATION Right 10/16/2015   Procedure: HOLMIUM LASER APPLICATION;  Surgeon: Jerilee Field, MD;  Location: [facility];  Service: Urology;  Laterality: Right;  . STONE EXTRACTION WITH BASKET Right 10/16/2015   Procedure: STONE EXTRACTION WITH BASKET;  Surgeon: Jerilee Field, MD;  [facility];  Service: Urology;  Laterality: Right;  . WISDOM TOOTH EXTRACTION  03/2014  There were no vitals filed for this visit.  Subjective Assessment - 05/16/19 0810    Subjective  I hav a crick in my neck. The pelvic floor is alot better. I have no kidney stones. I do not have to see the urologist for 1 year. I have no abdominal pain today.    Patient Stated Goals  less pain, be able to fully empty her  bladder    Currently in Pain?  No/denies                       [facility] Adult PT Treatment/Exercise - 05/16/19 0001      Lumbar Exercises: Stretches   Active Hamstring Stretch  Right;Left;1 rep;30 seconds    Active Hamstring Stretch Limitations  with strap    Press Ups  10 reps    Quadruped Mid Back Stretch  3 reps;60 seconds   using foam roll   Quadruped Mid Back Stretch Limitations  all three directions with therapist placing overpressure anterior to coccyx and superior transverse perineum    Piriformis Stretch  Right;Left;1 rep;60 seconds    Piriformis Stretch Limitations  using a strap      Lumbar Exercises: Supine   Bent Knee Raise  10 reps;1 second   each leg   Bent Knee Raise Limitations  go from bent knee to SLR    Bridge  15 reps    Bridge Limitations  with feet on roll    Bridge with March  10 reps;1 second   each side   Other Supine Lumbar Exercises  lay on back with ball between legs and flex and extend knees      Lumbar Exercises: Sidelying   Clam  Right;Left;10 reps;1 second      Lumbar Exercises: Quadruped   Opposite Arm/Leg Raise  Right arm/Left leg;Left arm/Right leg;10 reps;1 second    Opposite Arm/Leg Raise Limitations  tactile cues to keep spinal neutral    Other Quadruped Lumbar Exercises  quadruped with walking hands back and forth then the knees engaging the core    Other Quadruped Lumbar Exercises  quadruped hip ER 10x each side keep spinal neutral             PT Education - 05/16/19 0837    Education Details  Access Code: [email or web address]    Person(s) Educated  Patient    Methods  Explanation;Demonstration;Verbal cues;Handout    Comprehension  Verbalized understanding;Returned demonstration       PT Short Term Goals - 02/20/19 0823      PT SHORT TERM GOAL #1   Title  independent with initial HEP    Time  4    Period  Weeks    Status  Achieved    Target Date  12/20/18      PT SHORT TERM GOAL #2   Title  understand how to  massage the pelvic floor to improve tissue mobility    Time  4    Period  Weeks    Status  Achieved    Target Date  12/20/18      PT SHORT TERM GOAL #3   Title  pelvic floor strength is 2/5 due to improved tissue mobilty    Time  4    Period  Weeks    Status  Achieved      PT SHORT TERM GOAL #4   Title  lower abdominal pain decreased >/= 2/10 due to improve tissue mobilty    Time  4  Period  Weeks    Status  Achieved    Target Date  12/20/18        PT Long Term Goals - 05/16/19 0838      PT LONG TERM GOAL #1   Title  independent with HEP and understand how to progress herself    Baseline  still learning and progressing her education    Time  12    Period  Weeks    Status  On-going      PT LONG TERM GOAL #2   Title  pain with penile penetration decreased >/= 0-1/10 due to improve tissue mobility    Baseline  80% better; 2/10    Time  12    Period  Weeks    Status  On-going      PT LONG TERM GOAL #3   Title  urinary leakage decreased to none to minimal with sneezng and coughing and not have to wear a pad, pelvic floor strength 3/5    Baseline  leakage with sneezng and coughing wearing a 1 pad per day; pelvic floor strength 3/5; leakage is 80% better    Time  12    Period  Weeks    Status  On-going      PT LONG TERM GOAL #4   Title  lower abdominal pain with sitting decreased >/= 0-1/10    Time  12    Period  Weeks    Status  Achieved            Plan - 05/16/19 0817    Clinical Impression Statement  Patient reports she is wearing a pad 4 days out of the week that are damp. Patient reports intercourse is 80% better. Patient is able to empty her bladder 60% better and if she sits for 10-15 seconds longe able to fully empty. Patient does not have abdominal pain today and was able to focus on her core strength. Patient will benefit from skilled therapy to improve coordination to reduce leakage and further reduce pain for improve quality of life.    Personal  Factors and Comorbidities  Sex;Comorbidity 1    Comorbidities  interstitial cystitis    Examination-Activity Limitations  Toileting;Continence    PT Frequency  1x / week    PT Duration  --   9 weeks   PT Treatment/Interventions  Biofeedback;Cryotherapy;Electrical Stimulation;Moist Heat;Ultrasound;Therapeutic activities;Therapeutic exercise;Neuromuscular re-education;Manual techniques;Patient/family education;Dry needling;Passive range of motion    PT Next Visit Plan  soft tissue work to abdomen and pelvic floor,   work on pelvic floor contraction and internal work; core strength    PT Home Exercise Plan  Access Code: [email or web address]    Consulted and Agree with Plan of Care  Patient       Patient will benefit from skilled therapeutic intervention in order to improve the following deficits and impairments:  Increased fascial restricitons, Pain, Decreased coordination, Decreased mobility, Increased muscle spasms, Decreased activity tolerance, Decreased endurance, Decreased range of motion, Decreased strength  Visit Diagnosis: Other muscle spasm  Other lack of coordination  Muscle weakness (generalized)  Interstitial cystitis     Problem List Patient Active Problem List   Diagnosis Date Noted  . BMI 37.0-37.9, adult 05/06/2019  . GERD without esophagitis 08/31/2018  . Wheezing 08/31/2018  . Depression, major, recurrent ([place]) 04/25/2018  . Generalized anxiety disorder 04/04/2018  . Pyelonephritis 11/04/2017  . Postoperative upper abdominal pain 10/31/2017  . Prediabetes 05/30/2017  . Vitamin D deficiency 05/30/2017  . Other hyperlipidemia 05/30/2017  .  Migraine without aura and without status migrainosus, not intractable 05/09/2017  . Other fatigue 03/13/2017  . Hyperglycemia 03/13/2017  . Overactive bladder 11/14/2015  . Syncope and collapse 09/27/2015  . Nephrolithiasis 09/27/2015  . Acute pyelonephritis 09/27/2015  . IC (interstitial cystitis) 09/27/2015  . Leukocytosis   .  Vesico-ureteral reflux 09/15/2014  . History of kidney stones 07/28/2014  . Syncope 03/20/2012    Eulis Fosterheryl Nelsie Domino, PT 05/16/19 8:42 AM   [facility] [address] [place], [place], [address] Phone: [phone]   Fax:  [phone]  Name: Lauren Cardenas MRN: [id number] Date of Birth: [birth year]

## 2019-05-21 ENCOUNTER — Encounter: Payer: Medicaid Other | Admitting: Physical Therapy

## 2019-05-29 ENCOUNTER — Other Ambulatory Visit: Payer: Self-pay

## 2019-05-29 ENCOUNTER — Encounter: Payer: Self-pay | Admitting: Physical Therapy

## 2019-05-29 ENCOUNTER — Ambulatory Visit: Payer: Medicaid Other | Attending: Urology | Admitting: Physical Therapy

## 2019-05-29 DIAGNOSIS — M62838 Other muscle spasm: Secondary | ICD-10-CM | POA: Insufficient documentation

## 2019-05-29 DIAGNOSIS — R278 Other lack of coordination: Secondary | ICD-10-CM | POA: Insufficient documentation

## 2019-05-29 DIAGNOSIS — N3010 Interstitial cystitis (chronic) without hematuria: Secondary | ICD-10-CM | POA: Insufficient documentation

## 2019-05-29 DIAGNOSIS — M6281 Muscle weakness (generalized): Secondary | ICD-10-CM | POA: Insufficient documentation

## 2019-05-29 NOTE — Therapy (Signed)
[facility] [address], [address] [place], [place], [address] Phone: [phone]   Fax:  [phone]  Physical Therapy Treatment  Patient Details  Name: Lauren Cardenas MRN: [id number] Date of Birth: 1986-02-04 Referring Provider (PT): Vianne Bulls, [facility]   Encounter Date: 05/29/2019  PT End of Session - 05/29/19 0842    Visit Number  17    Date for PT Re-Evaluation  06/26/19    Authorization Type  Medicaid    Authorization Time Period  05/02/2019-06/26/2019    Authorization - Visit Number  3    Authorization - Number of Visits  8    PT Start Time  0800    PT Stop Time  0842    PT Time Calculation (min)  42 min    Activity Tolerance  Patient tolerated treatment well;No increased pain    Behavior During Therapy  WFL for tasks assessed/performed       Past Medical History:  Diagnosis Date  . Chronic migraine    neurologist-  dr Lucia Gaskins  . Constipation   . Depression 05/30/2017   with emotional eating  . Dysuria   . GERD (gastroesophageal reflux disease)   . History of acute pyelonephritis    09-27-2015:   11-04-2017  w/ sepsis due to obstruction from kidney stone  . History of kidney stones   . IC (interstitial cystitis)   . Interstitial cystitis   . Left ureteral stone   . OAB (overactive bladder)   . Pre-diabetes   . Urge urinary incontinence   . Urgency of urination   . Urinary reflux   . Vesicoureteral reflux     Past Surgical History:  Procedure Laterality Date  . CHOLECYSTECTOMY N/A 10/26/2017   Procedure: LAPAROSCOPIC CHOLECYSTECTOMY WITH INTRAOPERATIVE CHOLANGIOGRAM ERAS PATHWAY;  Surgeon: Harriette Bouillon, MD;  [facility]: [facility];  Service: General;  Laterality: N/A;  . CYSTOSCOPY W/ URETERAL STENT PLACEMENT Right 09/27/2015   Procedure: CYSTOSCOPY WITH RETROGRADE PYELOGRAM/URETERAL STENT PLACEMENT;  Surgeon: Hildred Laser, MD;  Location: [facility];  Service: Urology;  Laterality: Right;  . CYSTOSCOPY WITH  RETROGRADE PYELOGRAM, URETEROSCOPY AND STENT PLACEMENT Left 09/09/2014   Procedure: CYSTOSCOPY WITH LEFT RETROGRADE PYELOGRAM, URETEROSCOPY AND STONE EXTRACTION  DOUBLE J STENT PLACEMENT;  Surgeon: Jerilee Field, MD;  Location: [facility];  Service: Urology;  Laterality: Left;  . CYSTOSCOPY WITH STENT PLACEMENT Left 11/04/2017   Procedure: CYSTOSCOPY WITH RETROGRADE AND LEFT STENT PLACEMENT;  Surgeon: Crista Elliot, MD;  Location: [facility];  Service: Urology;  Laterality: Left;  . CYSTOSCOPY/URETEROSCOPY/HOLMIUM LASER/STENT PLACEMENT Right 10/16/2015   Procedure: RIGHT URETEROSCOPY/HOLMIUM LASER/STENT PLACEMENT;  Surgeon: Jerilee Field, MD;  Location: [facility];  Service: Urology;  Laterality: Right;  . CYSTOSCOPY/URETEROSCOPY/HOLMIUM LASER/STENT PLACEMENT Left 11/21/2017   Procedure: CYSTOSCOPY/URETEROSCOPY/HOLMIUM LASER/STENT PLACEMENT;  Surgeon: Jerilee Field, MD;  Location: [facility];  Service: Urology;  Laterality: Left;  ONLY NEEDS 60 MIN  . HOLMIUM LASER APPLICATION Left 09/09/2014   Procedure: HOLMIUM LASER APPLICATION;  Surgeon: Jerilee Field, MD;  Location: [facility];  Service: Urology;  Laterality: Left;  . HOLMIUM LASER APPLICATION Right 10/16/2015   Procedure: HOLMIUM LASER APPLICATION;  Surgeon: Jerilee Field, MD;  Location: [facility];  Service: Urology;  Laterality: Right;  . STONE EXTRACTION WITH BASKET Right 10/16/2015   Procedure: STONE EXTRACTION WITH BASKET;  Surgeon: Jerilee Field, MD;  Location: [facility];  Service: Urology;  Laterality: Right;  . WISDOM TOOTH EXTRACTION  03/2014  There were no vitals filed for this visit.  Subjective Assessment - 05/29/19 0810    Subjective  No abdominal pain today. Emptying the bladder is 75% better. Urinary leakage  is 75% better    Patient Stated Goals  less pain, be able to fully empty her bladder    Currently in Pain?  No/denies    Multiple Pain Sites  No                     Pelvic Floor Special Questions - 05/29/19 0001    Pelvic Floor Internal Exam  Patient confirms identification and approves PT to assess the pelvic floor and treatment    Exam Type  Vaginal    Palpation  left side tighter than right    Strength  fair squeeze, definite lift        [facility] Adult PT Treatment/Exercise - 05/29/19 0001      Neuro Re-ed    Neuro Re-ed Details   tapping to the pelvic floor muscles to contract with more on right than left      Lumbar Exercises: Aerobic   Recumbent Bike  7 min level 2 while assessing patient       Manual Therapy   Manual Therapy  Internal Pelvic Floor    Internal Pelvic Floor  soft tissue work to right obturator internist, along bil. introitus, left  obturator internist, left levator ani and left side of bladder               PT Short Term Goals - 02/20/19 0823      PT SHORT TERM GOAL #1   Title  independent with initial HEP    Time  4    Period  Weeks    Status  Achieved    Target Date  12/20/18      PT SHORT TERM GOAL #2   Title  understand how to massage the pelvic floor to improve tissue mobility    Time  4    Period  Weeks    Status  Achieved    Target Date  12/20/18      PT SHORT TERM GOAL #3   Title  pelvic floor strength is 2/5 due to improved tissue mobilty    Time  4    Period  Weeks    Status  Achieved      PT SHORT TERM GOAL #4   Title  lower abdominal pain decreased >/= 2/10 due to improve tissue mobilty    Time  4    Period  Weeks    Status  Achieved    Target Date  12/20/18        PT Long Term Goals - 05/29/19 0845      PT LONG TERM GOAL #2   Title  pain with penile penetration decreased >/= 0-1/10 due to improve tissue mobility    Baseline  85% better; 2/10    Time  12    Period  Weeks    Status  On-going      PT LONG TERM GOAL #3   Title  urinary leakage decreased to none to minimal with sneezng and coughing and not have to wear a pad, pelvic floor  strength 3/5    Baseline  leakage with sneezng and coughing wearing a 3 pad per week; pelvic floor strength 3/5; leakage is 80% better    Time  12    Period  Weeks    Status  On-going  Plan - 05/29/19 0814    Clinical Impression Statement  Patient wears 3 pads per week not instead of 4. Patient reports pain with intercourse is 85% better. Patient reports urinary leakage is 75% better. Patietn is emptying her bladder with 75% greater ease. Patient has more tightess on the left pelvic floor compared to the right. Patient pelvic floor strength is 3/5. Patient will benefit from skilled therapy to improve coordination to reduce leakage and further reduce pain for improve quality of life.    Personal Factors and Comorbidities  Sex;Comorbidity 1    Comorbidities  interstitial cystitis    Examination-Activity Limitations  Toileting;Continence    Rehab Potential  Excellent    PT Frequency  1x / week    PT Duration  --   9 weeks   PT Treatment/Interventions  Biofeedback;Cryotherapy;Electrical Stimulation;Moist Heat;Ultrasound;Therapeutic activities;Therapeutic exercise;Neuromuscular re-education;Manual techniques;Patient/family education;Dry needling;Passive range of motion    PT Next Visit Plan  soft tissue work to left pelvic floor,   work on pelvic floor contraction on the left and internal work; core strength    PT Home Exercise Plan  Access Code: [email or web address]    Consulted and Agree with Plan of Care  Patient       Patient will benefit from skilled therapeutic intervention in order to improve the following deficits and impairments:  Increased fascial restricitons, Pain, Decreased coordination, Decreased mobility, Increased muscle spasms, Decreased activity tolerance, Decreased endurance, Decreased range of motion, Decreased strength  Visit Diagnosis: Other muscle spasm  Other lack of coordination  Muscle weakness (generalized)  Interstitial cystitis     Problem  List Patient Active Problem List   Diagnosis Date Noted  . BMI 37.0-37.9, adult 05/06/2019  . GERD without esophagitis 08/31/2018  . Wheezing 08/31/2018  . Depression, major, recurrent (HCC) 04/25/2018  . Generalized anxiety disorder 04/04/2018  . Pyelonephritis 11/04/2017  . Postoperative upper abdominal pain 10/31/2017  . Prediabetes 05/30/2017  . Vitamin D deficiency 05/30/2017  . Other hyperlipidemia 05/30/2017  . Migraine without aura and without status migrainosus, not intractable 05/09/2017  . Other fatigue 03/13/2017  . Hyperglycemia 03/13/2017  . Overactive bladder 11/14/2015  . Syncope and collapse 09/27/2015  . Nephrolithiasis 09/27/2015  . Acute pyelonephritis 09/27/2015  . IC (interstitial cystitis) 09/27/2015  . Leukocytosis   . Vesico-ureteral reflux 09/15/2014  . History of kidney stones 07/28/2014  . Syncope 03/20/2012    Eulis Fosterheryl Jezreel Sisk, PT 05/29/19 8:46 AM   [facility] [address], [address] [place], [place], [address] Phone: [phone]   Fax:  [phone]  Name: Lauren Cardenas MRN: [id number] Date of Birth: 1985/08/19

## 2019-06-01 NOTE — Progress Notes (Signed)
I agree with Lauren Cardenas Lauren Treatment and Plan of Care.

## 2019-06-05 ENCOUNTER — Encounter: Payer: Self-pay | Admitting: Physical Therapy

## 2019-06-05 ENCOUNTER — Ambulatory Visit: Payer: Medicaid Other | Admitting: Physical Therapy

## 2019-06-05 ENCOUNTER — Other Ambulatory Visit: Payer: Self-pay

## 2019-06-05 DIAGNOSIS — M62838 Other muscle spasm: Secondary | ICD-10-CM | POA: Diagnosis not present

## 2019-06-05 DIAGNOSIS — R278 Other lack of coordination: Secondary | ICD-10-CM

## 2019-06-05 DIAGNOSIS — N3010 Interstitial cystitis (chronic) without hematuria: Secondary | ICD-10-CM | POA: Diagnosis not present

## 2019-06-05 DIAGNOSIS — M6281 Muscle weakness (generalized): Secondary | ICD-10-CM

## 2019-06-05 NOTE — Therapy (Addendum)
[facility] [address], [place], [place], [address] Phone: [phone]   Fax:  [phone]  Physical Therapy Treatment  Patient Details  Name: Lauren Cardenas MRN: [id number] Date of Birth: [birth year] Referring Provider (PT): Azucena Fallen, [employer]   Encounter Date: 06/05/2019  PT End of Session - 06/05/19 0841    Visit Number  18    Date for PT Re-Evaluation  06/26/19    Authorization Type  Medicaid    Authorization Time Period  05/02/2019-06/26/2019    Authorization - Visit Number  4    Authorization - Number of Visits  8    PT Start Time  0800    PT Stop Time  5638    PT Time Calculation (min)  42 min    Activity Tolerance  Patient tolerated treatment well;No increased pain    Behavior During Therapy  WFL for tasks assessed/performed       Past Medical History:  Diagnosis Date  . Chronic migraine    neurologist-  dr Jaynee Eagles  . Constipation   . Depression 05/30/2017   with emotional eating  . Dysuria   . GERD (gastroesophageal reflux disease)   . History of acute pyelonephritis    09-27-2015:   11-04-2017  w/ sepsis due to obstruction from kidney stone  . History of kidney stones   . IC (interstitial cystitis)   . Interstitial cystitis   . Left ureteral stone   . OAB (overactive bladder)   . Pre-diabetes   . Urge urinary incontinence   . Urgency of urination   . Urinary reflux   . Vesicoureteral reflux     Past Surgical History:  Procedure Laterality Date  . CHOLECYSTECTOMY N/A 10/26/2017   Procedure: LAPAROSCOPIC CHOLECYSTECTOMY WITH INTRAOPERATIVE CHOLANGIOGRAM ERAS PATHWAY;  Surgeon: Erroll Luna, MD;  Location: [facility];  Service: General;  Laterality: N/A;  . CYSTOSCOPY W/ URETERAL STENT PLACEMENT Right 09/27/2015   Procedure: CYSTOSCOPY WITH RETROGRADE PYELOGRAM/URETERAL STENT PLACEMENT;  Surgeon: Nickie Retort, MD;  [facility];  Service: Urology;  Laterality: Right;  . CYSTOSCOPY WITH  RETROGRADE PYELOGRAM, URETEROSCOPY AND STENT PLACEMENT Left 09/09/2014   Procedure: CYSTOSCOPY WITH LEFT RETROGRADE PYELOGRAM, URETEROSCOPY AND STONE EXTRACTION  DOUBLE J STENT PLACEMENT;  Surgeon: Festus Aloe, MD;  Location: [facility];  Service: Urology;  Laterality: Left;  . CYSTOSCOPY WITH STENT PLACEMENT Left 11/04/2017   Procedure: CYSTOSCOPY WITH RETROGRADE AND LEFT STENT PLACEMENT;  Surgeon: Lucas Mallow, MD;  Location: [facility];  Service: Urology;  Laterality: Left;  . CYSTOSCOPY/URETEROSCOPY/HOLMIUM LASER/STENT PLACEMENT Right 10/16/2015   Procedure: RIGHT URETEROSCOPY/HOLMIUM LASER/STENT PLACEMENT;  Surgeon: Festus Aloe, MD;  Location: [facility];  Service: Urology;  Laterality: Right;  . CYSTOSCOPY/URETEROSCOPY/HOLMIUM LASER/STENT PLACEMENT Left 11/21/2017   Procedure: CYSTOSCOPY/URETEROSCOPY/HOLMIUM LASER/STENT PLACEMENT;  Surgeon: Festus Aloe, MD;  Location: [facility];  Service: Urology;  Laterality: Left;  ONLY NEEDS 60 MIN  . HOLMIUM LASER APPLICATION Left 7/56/4332   Procedure: HOLMIUM LASER APPLICATION;  Surgeon: Festus Aloe, MD;  [facility];  Service: Urology;  Laterality: Left;  . HOLMIUM LASER APPLICATION Right 9/51/8841   Procedure: HOLMIUM LASER APPLICATION;  Surgeon: Festus Aloe, MD;  Location: [facility];  Service: Urology;  Laterality: Right;  . STONE EXTRACTION WITH BASKET Right 10/16/2015   Procedure: STONE EXTRACTION WITH BASKET;  Surgeon: Festus Aloe, MD;  Location: [facility];  Service: Urology;  Laterality: Right;  . WISDOM TOOTH EXTRACTION  03/2014  There were no vitals filed for this visit.  Subjective Assessment - 06/05/19 0805    Subjective  I feel good today. I feel so much better. I am having less pain, less burning,and less abdominal pain.    Patient Stated Goals  less pain, be able to fully empty her bladder    Currently in Pain?  No/denies    Multiple Pain  Sites  No                    Pelvic Floor Special Questions - 06/05/19 0001    Pelvic Floor Internal Exam  Patient confirms identification and approves PT to assess the pelvic floor and treatment    Exam Type  Vaginal    Strength  fair squeeze, definite lift        [facility] Adult PT Treatment/Exercise - 06/05/19 0001      Lumbar Exercises: Stretches   ITB Stretch  Right;Left;1 rep;60 seconds    ITB Stretch Limitations  using foam roll    Piriformis Stretch  Right;Left;1 rep;60 seconds    Piriformis Stretch Limitations  using the foam roll      Lumbar Exercises: Aerobic   Recumbent Bike  7 min level 3 while assessing patient       Manual Therapy   Manual Therapy  Internal Pelvic Floor    Internal Pelvic Floor  soft tissue work to left side of pelvic floor in left sidely to puborectalis, ATLA, urethra sphincter, levator ani               PT Short Term Goals - 02/20/19 2330      PT SHORT TERM GOAL #1   Title  independent with initial HEP    Time  4    Period  Weeks    Status  Achieved    Target Date  12/20/18      PT SHORT TERM GOAL #2   Title  understand how to massage the pelvic floor to improve tissue mobility    Time  4    Period  Weeks    Status  Achieved    Target Date  12/20/18      PT SHORT TERM GOAL #3   Title  pelvic floor strength is 2/5 due to improved tissue mobilty    Time  4    Period  Weeks    Status  Achieved      PT SHORT TERM GOAL #4   Title  lower abdominal pain decreased >/= 2/10 due to improve tissue mobilty    Time  4    Period  Weeks    Status  Achieved    Target Date  12/20/18        PT Long Term Goals - 06/05/19 0811      PT LONG TERM GOAL #3   Title  urinary leakage decreased to none to minimal with sneezng and coughing and not have to wear a pad, pelvic floor strength 3/5    Baseline  a little bit some times, if more pelvic pain will leak    Time  12    Period  Weeks    Status  On-going      PT LONG TERM  GOAL #4   Title  lower abdominal pain with sitting decreased >/= 0-1/10    Time  12    Period  Weeks    Status  Achieved            Plan -  06/05/19 0818    Clinical Impression Statement  Patient is now wearing 2 pads per week. Patient reports urinary leakage is better by 85%. Patient is able to empty her bladder 80% better. Patient urinary leakage is 85% better. Patient left pelvic floor is softer. Patient will benefit from skilled therapy to improve coordination to reduce leakage and further reduce pain for improved coordination to reduce leakage and further reduce pain for improved quality of life.    Personal Factors and Comorbidities  Sex;Comorbidity 1    Comorbidities  interstitial cystitis    Examination-Activity Limitations  Toileting;Continence    Rehab Potential  Excellent    PT Frequency  1x / week    PT Duration  --   9 weeks   PT Treatment/Interventions  Biofeedback;Cryotherapy;Electrical Stimulation;Moist Heat;Ultrasound;Therapeutic activities;Therapeutic exercise;Neuromuscular re-education;Manual techniques;Patient/family education;Dry needling;Passive range of motion    PT Next Visit Plan  soft tissue work to left pelvic floor,   work on pelvic floor contraction on the left and internal work; core strength; discharge next visit    PT Home Exercise Plan  Access Code: [email or web address]    Consulted and Agree with Plan of Care  Patient       Patient will benefit from skilled therapeutic intervention in order to improve the following deficits and impairments:  Increased fascial restricitons, Pain, Decreased coordination, Decreased mobility, Increased muscle spasms, Decreased activity tolerance, Decreased endurance, Decreased range of motion, Decreased strength  Visit Diagnosis: Other muscle spasm  Other lack of coordination  Muscle weakness (generalized)  Interstitial cystitis     Problem List Patient Active Problem List   Diagnosis Date Noted  . BMI 37.0-37.9, adult  05/06/2019  . GERD without esophagitis 08/31/2018  . Wheezing 08/31/2018  . Depression, major, recurrent ([place]) 04/25/2018  . Generalized anxiety disorder 04/04/2018  . Pyelonephritis 11/04/2017  . Postoperative upper abdominal pain 10/31/2017  . Prediabetes 05/30/2017  . Vitamin D deficiency 05/30/2017  . Other hyperlipidemia 05/30/2017  . Migraine without aura and without status migrainosus, not intractable 05/09/2017  . Other fatigue 03/13/2017  . Hyperglycemia 03/13/2017  . Overactive bladder 11/14/2015  . Syncope and collapse 09/27/2015  . Nephrolithiasis 09/27/2015  . Acute pyelonephritis 09/27/2015  . IC (interstitial cystitis) 09/27/2015  . Leukocytosis   . Vesico-ureteral reflux 09/15/2014  . History of kidney stones 07/28/2014  . Syncope 03/20/2012    Earlie Counts, PT 06/05/19 8:46 AM   [facility] [address] [place], [place], [address] Phone: [phone]   Fax:  [phone]  Name: Lauren Cardenas MRN: [id number] Date of Birth: 1985/12/31  PHYSICAL THERAPY DISCHARGE SUMMARY  Visits from Start of Care: 18  Current functional level related to goals / functional outcomes: See above. Patient was ready for discharge just did not come to the last visit for the official discharge session.    Remaining deficits: See above.   Education / Equipment: HEP Plan: Patient agrees to discharge.  Patient goals were met. Patient is being discharged due to meeting the stated rehab goals.  Thank you for the referral. Earlie Counts, PT 08/26/19 9:22 AM  ?????

## 2019-06-06 ENCOUNTER — Other Ambulatory Visit: Payer: Self-pay | Admitting: Family Medicine

## 2019-06-06 DIAGNOSIS — F411 Generalized anxiety disorder: Secondary | ICD-10-CM

## 2019-06-06 DIAGNOSIS — F330 Major depressive disorder, recurrent, mild: Secondary | ICD-10-CM

## 2019-06-06 DIAGNOSIS — G43009 Migraine without aura, not intractable, without status migrainosus: Secondary | ICD-10-CM

## 2019-06-12 ENCOUNTER — Ambulatory Visit: Payer: Medicaid Other | Admitting: Physical Therapy

## 2019-06-13 ENCOUNTER — Ambulatory Visit: Payer: Medicaid Other | Admitting: Family Medicine

## 2019-07-01 ENCOUNTER — Other Ambulatory Visit (HOSPITAL_COMMUNITY): Payer: Self-pay | Admitting: *Deleted

## 2019-07-01 DIAGNOSIS — D72829 Elevated white blood cell count, unspecified: Secondary | ICD-10-CM

## 2019-07-02 ENCOUNTER — Inpatient Hospital Stay (HOSPITAL_COMMUNITY): Payer: Medicaid Other | Attending: Hematology

## 2019-07-09 ENCOUNTER — Ambulatory Visit (HOSPITAL_COMMUNITY): Payer: Medicaid Other | Admitting: Hematology

## 2019-07-09 ENCOUNTER — Encounter: Payer: Self-pay | Admitting: Family Medicine

## 2019-07-10 ENCOUNTER — Other Ambulatory Visit: Payer: Self-pay | Admitting: Family Medicine

## 2019-07-10 DIAGNOSIS — F331 Major depressive disorder, recurrent, moderate: Secondary | ICD-10-CM

## 2019-07-10 DIAGNOSIS — F411 Generalized anxiety disorder: Secondary | ICD-10-CM

## 2019-07-10 MED ORDER — SERTRALINE HCL 100 MG PO TABS: 100.0000 mg | ORAL_TABLET | Freq: Every day | ORAL | 5 refills | Status: DC

## 2019-09-26 ENCOUNTER — Encounter: Payer: Self-pay | Admitting: *Deleted

## 2019-10-07 ENCOUNTER — Other Ambulatory Visit: Payer: Self-pay | Admitting: *Deleted

## 2019-10-07 DIAGNOSIS — F331 Major depressive disorder, recurrent, moderate: Secondary | ICD-10-CM

## 2019-10-07 DIAGNOSIS — K219 Gastro-esophageal reflux disease without esophagitis: Secondary | ICD-10-CM

## 2019-10-07 DIAGNOSIS — G43009 Migraine without aura, not intractable, without status migrainosus: Secondary | ICD-10-CM

## 2019-10-07 DIAGNOSIS — R062 Wheezing: Secondary | ICD-10-CM

## 2019-10-07 DIAGNOSIS — Z3041 Encounter for surveillance of contraceptive pills: Secondary | ICD-10-CM

## 2019-10-07 DIAGNOSIS — F411 Generalized anxiety disorder: Secondary | ICD-10-CM

## 2019-10-07 DIAGNOSIS — F330 Major depressive disorder, recurrent, mild: Secondary | ICD-10-CM

## 2019-10-07 DIAGNOSIS — R7303 Prediabetes: Secondary | ICD-10-CM

## 2019-10-07 MED ORDER — METFORMIN HCL 500 MG PO TABS: 500.0000 mg | ORAL_TABLET | Freq: Every day | ORAL | 0 refills | Status: DC

## 2019-10-07 MED ORDER — FAMOTIDINE 20 MG PO TABS: 20.0000 mg | ORAL_TABLET | Freq: Two times a day (BID) | ORAL | 0 refills | Status: DC

## 2019-10-07 MED ORDER — FLOVENT HFA 44 MCG/ACT IN AERO: 1.0000 | INHALATION_SPRAY | Freq: Two times a day (BID) | RESPIRATORY_TRACT | 0 refills | Status: DC

## 2019-10-07 MED ORDER — LEVONORGESTREL-ETHINYL ESTRAD 0.1-20 MG-MCG PO TABS: 1.0000 | ORAL_TABLET | Freq: Every day | ORAL | 2 refills | Status: DC

## 2019-10-07 MED ORDER — BUPROPION HCL ER (SR) 200 MG PO TB12: 200.0000 mg | ORAL_TABLET | Freq: Every day | ORAL | 0 refills | Status: DC

## 2019-10-07 MED ORDER — PROPRANOLOL HCL ER 120 MG PO CP24: 120.0000 mg | ORAL_CAPSULE | Freq: Every day | ORAL | 0 refills | Status: DC

## 2019-10-07 MED ORDER — ALBUTEROL SULFATE HFA 108 (90 BASE) MCG/ACT IN AERS: 2.0000 | INHALATION_SPRAY | Freq: Four times a day (QID) | RESPIRATORY_TRACT | 0 refills | Status: DC | PRN

## 2019-10-07 MED ORDER — SERTRALINE HCL 100 MG PO TABS: 100.0000 mg | ORAL_TABLET | Freq: Every day | ORAL | 0 refills | Status: DC

## 2019-10-18 DIAGNOSIS — R350 Frequency of micturition: Secondary | ICD-10-CM | POA: Diagnosis not present

## 2019-10-18 DIAGNOSIS — N200 Calculus of kidney: Secondary | ICD-10-CM | POA: Diagnosis not present

## 2019-10-18 DIAGNOSIS — R300 Dysuria: Secondary | ICD-10-CM | POA: Diagnosis not present

## 2019-11-06 ENCOUNTER — Ambulatory Visit: Payer: Medicaid Other | Admitting: Family Medicine

## 2019-11-19 ENCOUNTER — Telehealth: Payer: Medicaid Other | Admitting: Physician Assistant

## 2019-11-19 DIAGNOSIS — M542 Cervicalgia: Secondary | ICD-10-CM | POA: Diagnosis not present

## 2019-11-19 DIAGNOSIS — M62838 Other muscle spasm: Secondary | ICD-10-CM

## 2019-11-19 MED ORDER — TIZANIDINE HCL 2 MG PO CAPS: 2.0000 mg | ORAL_CAPSULE | Freq: Three times a day (TID) | ORAL | 0 refills | Status: AC

## 2019-11-19 NOTE — Progress Notes (Signed)
We are sorry that you are not feeling well.  Here is how we plan to help!  Based on what you have shared with me it looks like you mostly have acute neck pain.  Acute neck pain is defined as musculoskeletal pain that can resolve in 1-3 weeks with conservative treatment.  If you fail to improve or if your symptoms worsen, please proceed to the Urgent Care or Emergency Department.   I have prescribedTizanidine 2 mg every eight hours as needed which is a muscle relaxer   Please keep in mind that muscle relaxer's can cause fatigue and should not be taken while at work or driving.  Neck pain is very common.  The pain often gets better over time.  The cause of neck pain is usually not dangerous.  Most people can learn to manage their pain on their own.  Home Care  Stay active.  Start with short walks on flat ground if you can.  Try to walk farther each day.  Do not sit, drive or stand in one place for more than 30 minutes.  Do not stay in bed.  Do not avoid exercise or work.  Activity can help your back heal faster.  Be careful when you bend or lift an object.  Bend at your knees, keep the object close to you, and do not twist.  Sleep on a firm mattress.  Lie on your side, and bend your knees.  If you lie on your back, put a pillow under your knees.  Only take medicines as told by your doctor.  Put ice on the injured area.  Put ice in a plastic bag  Place a towel between your skin and the bag  Leave the ice on for 15-20 minutes, 3-4 times a day for the first 2-3 days. 210 After that, you can switch between ice and heat packs.  Ask your doctor about back exercises or massage.  Avoid feeling anxious or stressed.  Find good ways to deal with stress, such as exercise.  Get Help Right Way If:  Your pain does not go away with rest or medicine.  Your pain does not go away in 1 week.  You have new problems.  You do not feel well.  The pain spreads into your legs.  You cannot control  when you poop (bowel movement) or pee (urinate)  You feel sick to your stomach (nauseous) or throw up (vomit)  You have belly (abdominal) pain.  You feel like you may pass out (faint).  If you develop a fever.  Make Sure you:  Understand these instructions.  Will watch your condition  Will get help right away if you are not doing well or get worse.  Your e-visit answers were reviewed by a board certified advanced clinical practitioner to complete your personal care plan.  Depending on the condition, your plan could have included both over the counter or prescription medications.  If there is a problem please reply  once you have received a response from your provider.  Your safety is important to [place].  If you have drug allergies check your prescription carefully.    You can use MyChart to ask questions about today's visit, request a non-urgent call back, or ask for a work or school excuse for 24 hours related to this e-Visit. If it has been greater than 24 hours you will need to follow up with your provider, or enter a new e-Visit to address those concerns.  You will  get an e-mail in the next two days asking about your experience.  I hope that your e-visit has been valuable and will speed your recovery. Thank you for using e-visits.  Greater than 5 minutes, yet less than 10 minutes of time have been spent researching, coordinating and implementing care for this patient today.

## 2019-12-17 ENCOUNTER — Other Ambulatory Visit: Payer: Self-pay | Admitting: *Deleted

## 2019-12-17 DIAGNOSIS — G43009 Migraine without aura, not intractable, without status migrainosus: Secondary | ICD-10-CM

## 2019-12-18 MED ORDER — PROPRANOLOL HCL ER 120 MG PO CP24: 120.0000 mg | ORAL_CAPSULE | Freq: Every day | ORAL | 0 refills | Status: DC

## 2019-12-18 NOTE — Telephone Encounter (Signed)
Refill sent patient aware  

## 2019-12-18 NOTE — Addendum Note (Signed)
Addended by: Ignacia Bayley on: 12/18/2019 03:41 PM   Modules accepted: Orders

## 2019-12-18 NOTE — Telephone Encounter (Signed)
Pt states she does not use [employer] [facility] and has not received the refill for propranolol ER (INDERAL LA) 120 MG 24 hr capsule. Requesting rx to be sent to [facility].

## 2020-02-07 ENCOUNTER — Other Ambulatory Visit: Payer: Self-pay

## 2020-02-07 DIAGNOSIS — F411 Generalized anxiety disorder: Secondary | ICD-10-CM

## 2020-02-07 DIAGNOSIS — F331 Major depressive disorder, recurrent, moderate: Secondary | ICD-10-CM

## 2020-02-07 MED ORDER — SERTRALINE HCL 100 MG PO TABS: 100.0000 mg | ORAL_TABLET | Freq: Every day | ORAL | 0 refills | Status: DC

## 2020-03-31 ENCOUNTER — Other Ambulatory Visit: Payer: Self-pay | Admitting: Family Medicine

## 2020-03-31 DIAGNOSIS — G43009 Migraine without aura, not intractable, without status migrainosus: Secondary | ICD-10-CM

## 2020-04-30 DIAGNOSIS — R32 Unspecified urinary incontinence: Secondary | ICD-10-CM | POA: Diagnosis not present

## 2020-05-13 DIAGNOSIS — N200 Calculus of kidney: Secondary | ICD-10-CM | POA: Diagnosis not present

## 2020-05-13 DIAGNOSIS — N3946 Mixed incontinence: Secondary | ICD-10-CM | POA: Diagnosis not present

## 2020-05-13 DIAGNOSIS — R300 Dysuria: Secondary | ICD-10-CM | POA: Diagnosis not present

## 2020-06-09 DIAGNOSIS — R32 Unspecified urinary incontinence: Secondary | ICD-10-CM | POA: Diagnosis not present

## 2020-06-12 ENCOUNTER — Ambulatory Visit (INDEPENDENT_AMBULATORY_CARE_PROVIDER_SITE_OTHER): Payer: Medicaid Other | Admitting: Nurse Practitioner

## 2020-06-12 DIAGNOSIS — J069 Acute upper respiratory infection, unspecified: Secondary | ICD-10-CM | POA: Diagnosis not present

## 2020-06-12 NOTE — Addendum Note (Signed)
Addended by: Margorie John on: 06/12/2020 02:54 PM   Modules accepted: Orders

## 2020-06-12 NOTE — Progress Notes (Signed)
Virtual Visit via telephone Note Due to COVID-19 pandemic this visit was conducted virtually. This visit type was conducted due to national recommendations for restrictions regarding the COVID-19 Pandemic (e.g. social distancing, sheltering in place) in an effort to limit this patient's exposure and mitigate transmission in our community. All issues noted in this document were discussed and addressed.  A physical exam was not performed with this format.  I connected with Lauren Cardenas on 06/12/20 at 11:50 by telephone and verified that I am speaking with the correct person using two identifiers. Lauren Cardenas is currently located at home and  Her daughter is currently with her during visit. The provider, Mary-Margaret Daphine Deutscher, FNP is located in their office at time of visit.  I discussed the limitations, risks, security and privacy concerns of performing an evaluation and management service by telephone and the availability of in person appointments. I also discussed with the patient that there may be a patient responsible charge related to this service. The patient expressed understanding and agreed to proceed.   History and Present Illness:   Chief Complaint: URI   HPI Patient calls in c/o cough, and  congestion that started 3 days ago. Cough has become productive. Intermittent headache.she denies body aches or fever. Is able to taste or smell. Denies any on kown covid exposure. She has not had covid vaccines.   Review of Systems  Constitutional: Negative for chills and fever.  HENT: Positive for congestion. Negative for ear discharge, ear pain, sinus pain and sore throat.   Respiratory: Positive for cough (wet cough).   Neurological: Positive for headaches.  Psychiatric/Behavioral: Negative.   All other systems reviewed and are negative.    Observations/Objective: Alert and oriented- answers all questions appropriately No distress Deep wet cough   Assessment and  Plan: Lauren Cardenas in today with chief complaint of URI   1. Upper respiratory infection with cough and congestion 1. Take meds as prescribed 2. Use a cool mist humidifier especially during the winter months and when heat has been humid. 3. Use saline nose sprays frequently 4. Saline irrigations of the nose can be very helpful if done frequently.  * 4X daily for 1 week*  * Use of a nettie pot can be helpful with this. Follow directions with this* 5. Drink plenty of fluids 6. Keep thermostat turn down low 7.For any cough or congestion  Use plain Mucinex- regular strength or max strength is fine   * Children- consult with Pharmacist for dosing 8. For fever or aces or pains- take tylenol or ibuprofen appropriate for age and weight.  * for fevers greater than 101 orally you may alternate ibuprofen and tylenol every  3 hours. Quarantine until test results are back   - Novel Coronavirus, NAA ([facility]); Future     Follow Up Instructions: prn    I discussed the assessment and treatment plan with the patient. The patient was provided an opportunity to ask questions and all were answered. The patient agreed with the plan and demonstrated an understanding of the instructions.   The patient was advised to call back or seek an in-person evaluation if the symptoms worsen or if the condition fails to improve as anticipated.  The above assessment and management plan was discussed with the patient. The patient verbalized understanding of and has agreed to the management plan. Patient is aware to call the clinic if symptoms persist or worsen. Patient is aware when to return to the clinic for  a follow-up visit. Patient educated on when it is appropriate to go to the emergency department.   Time call ended:  12:03  I provided 13 minutes of non-face-to-face time during this encounter.    Mary-Margaret Daphine Deutscher, FNP

## 2020-06-13 LAB — NOVEL CORONAVIRUS, NAA: SARS-CoV-2, NAA: NOT DETECTED

## 2020-06-13 LAB — SARS-COV-2, NAA 2 DAY TAT

## 2020-06-15 ENCOUNTER — Telehealth: Payer: Self-pay

## 2020-06-15 NOTE — Telephone Encounter (Signed)
Delsym OTC along with flonase nasal spray

## 2020-06-15 NOTE — Telephone Encounter (Signed)
Pt saw MMM for televisit on 06/12/20 and was tested for covid. Covid result was negative but pt says she still has a bad cough and congestion. Wants to know if MMM can send medicine in to pharmacy for her?  Please advise and call patient.

## 2020-06-15 NOTE — Telephone Encounter (Signed)
Patient aware and verbalizes understanding. 

## 2020-06-16 DIAGNOSIS — J029 Acute pharyngitis, unspecified: Secondary | ICD-10-CM | POA: Diagnosis not present

## 2020-06-16 DIAGNOSIS — R059 Cough, unspecified: Secondary | ICD-10-CM | POA: Diagnosis not present

## 2020-06-16 DIAGNOSIS — J40 Bronchitis, not specified as acute or chronic: Secondary | ICD-10-CM | POA: Diagnosis not present

## 2020-06-16 DIAGNOSIS — R52 Pain, unspecified: Secondary | ICD-10-CM | POA: Diagnosis not present

## 2020-07-02 ENCOUNTER — Other Ambulatory Visit: Payer: Medicaid Other

## 2020-07-02 DIAGNOSIS — Z20822 Contact with and (suspected) exposure to covid-19: Secondary | ICD-10-CM

## 2020-07-02 DIAGNOSIS — Z8616 Personal history of COVID-19: Secondary | ICD-10-CM

## 2020-07-02 HISTORY — DX: Personal history of COVID-19: Z86.16

## 2020-07-06 LAB — NOVEL CORONAVIRUS, NAA: SARS-CoV-2, NAA: DETECTED — AB

## 2020-07-06 LAB — SPECIMEN STATUS REPORT

## 2020-08-03 ENCOUNTER — Other Ambulatory Visit: Payer: Self-pay | Admitting: Family Medicine

## 2020-08-03 DIAGNOSIS — F411 Generalized anxiety disorder: Secondary | ICD-10-CM

## 2020-08-03 DIAGNOSIS — F331 Major depressive disorder, recurrent, moderate: Secondary | ICD-10-CM

## 2020-08-04 ENCOUNTER — Other Ambulatory Visit: Payer: Self-pay | Admitting: *Deleted

## 2020-08-04 DIAGNOSIS — R062 Wheezing: Secondary | ICD-10-CM

## 2020-08-04 DIAGNOSIS — Z3041 Encounter for surveillance of contraceptive pills: Secondary | ICD-10-CM

## 2020-08-31 DIAGNOSIS — R32 Unspecified urinary incontinence: Secondary | ICD-10-CM | POA: Diagnosis not present

## 2020-09-25 ENCOUNTER — Other Ambulatory Visit: Payer: Self-pay | Admitting: Family Medicine

## 2020-09-25 DIAGNOSIS — F411 Generalized anxiety disorder: Secondary | ICD-10-CM

## 2020-09-25 DIAGNOSIS — K219 Gastro-esophageal reflux disease without esophagitis: Secondary | ICD-10-CM

## 2020-09-25 DIAGNOSIS — Z3041 Encounter for surveillance of contraceptive pills: Secondary | ICD-10-CM

## 2020-09-25 DIAGNOSIS — G43009 Migraine without aura, not intractable, without status migrainosus: Secondary | ICD-10-CM

## 2020-09-25 DIAGNOSIS — F331 Major depressive disorder, recurrent, moderate: Secondary | ICD-10-CM

## 2020-09-25 MED ORDER — FAMOTIDINE 20 MG PO TABS: 20.0000 mg | ORAL_TABLET | Freq: Two times a day (BID) | ORAL | 0 refills | Status: DC

## 2020-09-25 MED ORDER — LEVONORGESTREL-ETHINYL ESTRAD 0.1-20 MG-MCG PO TABS: 1.0000 | ORAL_TABLET | Freq: Every day | ORAL | 0 refills | Status: DC

## 2020-09-25 NOTE — Addendum Note (Signed)
Addended by: Julious Payer D on: 09/25/2020 04:07 PM   Modules accepted: Orders

## 2020-10-05 ENCOUNTER — Encounter: Payer: Self-pay | Admitting: Family Medicine

## 2020-10-05 ENCOUNTER — Other Ambulatory Visit: Payer: Self-pay

## 2020-10-05 ENCOUNTER — Ambulatory Visit (INDEPENDENT_AMBULATORY_CARE_PROVIDER_SITE_OTHER): Payer: Medicaid Other | Admitting: Family Medicine

## 2020-10-05 VITALS — BP 131/79 | HR 95 | Temp 97.7°F | Ht 61.0 in | Wt 208.4 lb

## 2020-10-05 DIAGNOSIS — F411 Generalized anxiety disorder: Secondary | ICD-10-CM

## 2020-10-05 DIAGNOSIS — N3010 Interstitial cystitis (chronic) without hematuria: Secondary | ICD-10-CM

## 2020-10-05 DIAGNOSIS — G43009 Migraine without aura, not intractable, without status migrainosus: Secondary | ICD-10-CM

## 2020-10-05 DIAGNOSIS — Z7689 Persons encountering health services in other specified circumstances: Secondary | ICD-10-CM

## 2020-10-05 DIAGNOSIS — R7303 Prediabetes: Secondary | ICD-10-CM | POA: Diagnosis not present

## 2020-10-05 DIAGNOSIS — Z3041 Encounter for surveillance of contraceptive pills: Secondary | ICD-10-CM

## 2020-10-05 DIAGNOSIS — Z0001 Encounter for general adult medical examination with abnormal findings: Secondary | ICD-10-CM | POA: Diagnosis not present

## 2020-10-05 DIAGNOSIS — E559 Vitamin D deficiency, unspecified: Secondary | ICD-10-CM

## 2020-10-05 DIAGNOSIS — K219 Gastro-esophageal reflux disease without esophagitis: Secondary | ICD-10-CM | POA: Diagnosis not present

## 2020-10-05 DIAGNOSIS — Z0000 Encounter for general adult medical examination without abnormal findings: Secondary | ICD-10-CM

## 2020-10-05 DIAGNOSIS — F331 Major depressive disorder, recurrent, moderate: Secondary | ICD-10-CM | POA: Diagnosis not present

## 2020-10-05 MED ORDER — SERTRALINE HCL 100 MG PO TABS: 1.0000 | ORAL_TABLET | Freq: Every day | ORAL | 3 refills | Status: DC

## 2020-10-05 MED ORDER — NURTEC 75 MG PO TBDP: 1.0000 | ORAL_TABLET | Freq: Every day | ORAL | 0 refills | Status: DC | PRN

## 2020-10-05 MED ORDER — FAMOTIDINE 20 MG PO TABS: 20.0000 mg | ORAL_TABLET | Freq: Two times a day (BID) | ORAL | 3 refills | Status: DC

## 2020-10-05 MED ORDER — LEVONORGESTREL-ETHINYL ESTRAD 0.1-20 MG-MCG PO TABS: 1.0000 | ORAL_TABLET | Freq: Every day | ORAL | 4 refills | Status: DC

## 2020-10-05 MED ORDER — BUPROPION HCL ER (SR) 200 MG PO TB12: 200.0000 mg | ORAL_TABLET | Freq: Every day | ORAL | 0 refills | Status: DC

## 2020-10-05 NOTE — Progress Notes (Signed)
Lauren Cardenas is a 35 y.o. female presents to office today for annual physical exam examination.    Concerns today include: 1.  Obesity Patient was seeing healthy weight and wellness but was lost to follow-up due to multiple issues going on.  She was doing well on Wellbutrin 200 mg daily and would like to renew this.  She does plan on reaching out to healthy weight and wellness to reestablish care she was doing really well with them.  She is not on Metformin anymore and has been off for over 3 months.  She has history of prediabetes but has never been diagnosed with diabetes.  There is a family history of diabetes, specifically in her father, who died from complications.  2.  Migraine headaches Patient is on propranolol 120 mg daily for migraine prevention and she does fairly well on this.  She has about 2 migraines per month.  She was previously managed by Dr. Lavell Anchors but was told just to follow-up if needed since symptoms are well controlled.  She has Maxalt on hand if needed but notes that it tastes pretty bad  Occupation: works 10-4, Marital status: married, Substance use: none Diet: fair, Exercise: no structured Last eye exam: UTD, vision stable. New glasses Last dental exam: UTD Last colonoscopy: n.a Last mammogram: n/a Last pap smear: UTD Refills needed today: none Immunizations needed:  Immunization History  Administered Date(s) Administered  . DTaP 03/15/1989, 03/12/1990  . Hepatitis B 04/04/1996, 05/13/1996, 10/15/1996  . IPV 03/15/1989, 03/12/1990  . Influenza Split 06/14/2012  . Influenza,inj,Quad PF,6+ Mos 08/17/2017, 04/04/2018, 05/06/2019  . MMR 03/12/1990  . Tdap 06/16/2012     Past Medical History:  Diagnosis Date  . Chronic migraine    neurologist-  dr Jaynee Eagles  . Constipation   . Depression 05/30/2017   with emotional eating  . Dysuria   . GERD (gastroesophageal reflux disease)   . History of acute pyelonephritis    09-27-2015:   11-04-2017  w/ sepsis due  to obstruction from kidney stone  . History of kidney stones   . IC (interstitial cystitis)   . Interstitial cystitis   . Left ureteral stone   . OAB (overactive bladder)   . Pre-diabetes   . Urge urinary incontinence   . Urgency of urination   . Urinary reflux   . Vesicoureteral reflux    Social History   Socioeconomic History  . Marital status: Married    Spouse name: Broadus John  . Number of children: 1  . Years of education: 57  . Highest education level: Not on file  Occupational History  . Occupation: [employer]  Tobacco Use  . Smoking status: Never Smoker  . Smokeless tobacco: Never Used  Vaping Use  . Vaping Use: Never used  Substance and Sexual Activity  . Alcohol use: No  . Drug use: No  . Sexual activity: Yes    Birth control/protection: None  Other Topics Concern  . Not on file  Social History Narrative   Lives with husband and daughter and in-laws   Caffeine use: 100-174m/week   She drinks about 3 cups of diet caffeine soda per week   [place] [employer] [place] [place]  Social Determinants of Health   Financial Resource Strain: Not on file  Food Insecurity: Not on file  Transportation Needs: Not on file  Physical Activity: Not on file  Stress: Not on file  Social Connections: Not on file  Intimate Partner Violence: Not on file   Past Surgical History:  Procedure Laterality Date  . CHOLECYSTECTOMY N/A 10/26/2017   Procedure: LAPAROSCOPIC CHOLECYSTECTOMY WITH INTRAOPERATIVE CHOLANGIOGRAM ERAS PATHWAY;  Surgeon: Erroll Luna, MD;  Location: [facility];  Service: General;  Laterality: N/A;  . CYSTOSCOPY W/ URETERAL STENT PLACEMENT Right 09/27/2015   Procedure: CYSTOSCOPY WITH RETROGRADE PYELOGRAM/URETERAL STENT PLACEMENT;  Surgeon: Nickie Retort, MD;  Location: [facility];  Service: Urology;  Laterality: Right;  . CYSTOSCOPY WITH RETROGRADE PYELOGRAM, URETEROSCOPY AND STENT PLACEMENT Left 09/09/2014   Procedure: CYSTOSCOPY  WITH LEFT RETROGRADE PYELOGRAM, URETEROSCOPY AND STONE EXTRACTION  DOUBLE J STENT PLACEMENT;  Surgeon: Festus Aloe, MD;  Location: [facility];  Service: Urology;  Laterality: Left;  . CYSTOSCOPY WITH STENT PLACEMENT Left 11/04/2017   Procedure: CYSTOSCOPY WITH RETROGRADE AND LEFT STENT PLACEMENT;  Surgeon: Lucas Mallow, MD;  Location: [facility];  Service: Urology;  Laterality: Left;  . CYSTOSCOPY/URETEROSCOPY/HOLMIUM LASER/STENT PLACEMENT Right 10/16/2015   Procedure: RIGHT URETEROSCOPY/HOLMIUM LASER/STENT PLACEMENT;  Surgeon: Festus Aloe, MD;  Location: [facility];  Service: Urology;  Laterality: Right;  . CYSTOSCOPY/URETEROSCOPY/HOLMIUM LASER/STENT PLACEMENT Left 11/21/2017   Procedure: CYSTOSCOPY/URETEROSCOPY/HOLMIUM LASER/STENT PLACEMENT;  Surgeon: Festus Aloe, MD;  Location: [facility];  Service: Urology;  Laterality: Left;  ONLY NEEDS 60 MIN  . HOLMIUM LASER APPLICATION Left 3/66/2947   Procedure: HOLMIUM LASER APPLICATION;  Surgeon: Festus Aloe, MD;  Location: [facility];  Service: Urology;  Laterality: Left;  . HOLMIUM LASER APPLICATION Right 6/54/6503   Procedure: HOLMIUM LASER APPLICATION;  Surgeon: Festus Aloe, MD;  Location: [facility];  Service: Urology;  Laterality: Right;  . STONE EXTRACTION WITH BASKET Right 10/16/2015   Procedure: STONE EXTRACTION WITH BASKET;  Surgeon: Festus Aloe, MD;  Location: [facility];  Service: Urology;  Laterality: Right;  . WISDOM TOOTH EXTRACTION  03/2014   Family History  Problem Relation Age of Onset  . Hypertension Mother   . Obesity Mother   . Diabetes Father   . Kidney disease Father   . Sudden death Father     Current Outpatient Medications:  .  albuterol (VENTOLIN HFA) 108 (90 Base) MCG/ACT inhaler, Inhale 2 puffs into the lungs every 6 (six) hours as needed for wheezing or shortness of breath., Disp: 18 g, Rfl: 0 .  buPROPion (WELLBUTRIN SR) 200 MG 12 hr  tablet, Take 1 tablet (200 mg total) by mouth at bedtime., Disp: 90 tablet, Rfl: 0 .  cetirizine (ZYRTEC) 10 MG chewable tablet, Chew 10 mg by mouth daily. Per pt, Disp: , Rfl:  .  famotidine (PEPCID) 20 MG tablet, Take 1 tablet (20 mg total) by mouth 2 (two) times daily., Disp: 180 tablet, Rfl: 0 .  fluticasone (FLOVENT HFA) 44 MCG/ACT inhaler, Inhale 1 puff into the lungs 2 (two) times daily., Disp: 31.8 Inhaler, Rfl: 0 .  ibuprofen (ADVIL,MOTRIN) 200 MG tablet, Take 400 mg by mouth every 6 (six) hours as needed for mild pain., Disp: , Rfl:  .  levonorgestrel-ethinyl estradiol (AVIANE) 0.1-20 MG-MCG tablet, Take 1 tablet by mouth daily., Disp: 28 tablet, Rfl: 0 .  metFORMIN (GLUCOPHAGE) 500 MG tablet, Take 1 tablet (500 mg total) by mouth daily., Disp: 90 tablet, Rfl: 0 .  Meth-Hyo-M Bl-Na Phos-Ph Sal (URIBEL) 118 MG CAPS, , Disp: , Rfl:  .  Misc Natural Products (CRANBERRY/PROBIOTIC) TABS, Take 1 tablet by mouth at bedtime., Disp: , Rfl:  .  Multiple Vitamin (MULTI-VITAMIN DAILY PO), Take 1 tablet  by mouth at bedtime. , Disp: , Rfl:  .  OMEGA-3 FATTY ACIDS PO, Take by mouth daily. Per pt she was unsure of the dosage., Disp: , Rfl:  .  propranolol ER (INDERAL LA) 120 MG 24 hr capsule, TAKE 1 CAPSULE AT BEDTIME, Disp: 90 capsule, Rfl: 0 .  Red Yeast Rice 600 MG CAPS, Take 2,400 mg by mouth 2 times daily at 12 noon and 4 pm. Per pt takes 1200 mg BID, Disp: , Rfl:  .  rizatriptan (MAXALT-MLT) 10 MG disintegrating tablet, Take 1 tablet (10 mg total) by mouth 3 (three) times daily as needed for migraine., Disp: 9 tablet, Rfl: 11 .  sertraline (ZOLOFT) 100 MG tablet, TAKE 1 TABLET DAILY, Disp: 90 tablet, Rfl: 0  Allergies  Allergen Reactions  . Sudafed [Pseudoephedrine Hcl] Shortness Of Breath  . Amoxicillin Hives    ++ got ceftriaxone in the past++ Has patient had a PCN reaction causing immediate rash, facial/tongue/throat swelling, SOB or lightheadedness with hypotension: Yes Has patient had a  PCN reaction causing severe rash involving mucus membranes or skin necrosis: No Has patient had a PCN reaction that required hospitalization: No Has patient had a PCN reaction occurring within the last 10 years: Yes If all of the above answers are "NO", then may proceed with Cephalosporin use.      ROS: Review of Systems Pertinent items noted in HPI and remainder of comprehensive ROS otherwise negative.    Physical exam BP 131/79   Pulse 95   Temp 97.7 F (36.5 C)   Ht '5\' 1"'  (1.549 m)   Wt 208 lb 6.4 oz (94.5 kg)   LMP 09/25/2020   SpO2 95%   BMI 39.38 kg/m  General appearance: alert, cooperative, appears stated age and moderately obese Head: Normocephalic, without obvious abnormality, atraumatic Eyes: negative findings: lids and lashes normal, conjunctivae and sclerae normal, corneas clear and pupils equal, round, reactive to light and accomodation Ears: normal TM's and external ear canals both ears Nose: Nares normal. Septum midline. Mucosa normal. No drainage or sinus tenderness. Throat: lips, mucosa, and tongue normal; teeth and gums normal Neck: no adenopathy, supple, symmetrical, trachea midline and thyroid not enlarged, symmetric, no tenderness/mass/nodules Back: symmetric, no curvature. ROM normal. No CVA tenderness. Lungs: clear to auscultation bilaterally Heart: regular rate and rhythm, S1, S2 normal, no murmur, click, rub or gallop Abdomen: soft, non-tender; bowel sounds normal; no masses,  no organomegaly Extremities: extremities normal, atraumatic, no cyanosis or edema Pulses: 2+ and symmetric Skin: Skin color, texture, turgor normal. No rashes or lesions Lymph nodes: Cervical, supraclavicular, and axillary nodes normal. Neurologic: Alert and oriented X 3, normal strength and tone. Normal symmetric reflexes. Normal coordination and gait Psych: Mood stable, speech normal, affect appropriate.  Patient is pleasant and interactive Depression screen [facility] 2/9 10/05/2020  05/06/2019 12/11/2018 12/11/2018 09/08/2018  Decreased Interest 0 0 0 0 0  Down, Depressed, Hopeless 0 0 0 0 0  PHQ - 2 Score 0 0 0 0 0  Altered sleeping - - 0 - -  Tired, decreased energy - - 1 - -  Change in appetite - - 0 - -  Feeling bad or failure about yourself  - - 0 - -  Trouble concentrating - - 3 - -  Moving slowly or fidgety/restless - - 0 - -  Suicidal thoughts - - 0 - -  PHQ-9 Score - - 4 - -  Difficult doing work/chores - - - - -    Assessment/  Plan: Tora Perches here for annual physical exam.   Annual physical exam  Establishing care with new doctor, encounter for  Prediabetes - Plan: CMP14+EGFR, Bayer DCA Hb A1c Waived  Morbid obesity ([place]) - Plan: Lipid panel, Bayer DCA Hb A1c Waived, TSH, buPROPion (WELLBUTRIN SR) 200 MG 12 hr tablet  Vitamin D deficiency - Plan: VITAMIN D 25 Hydroxy (Vit-D Deficiency, Fractures)  Migraine without aura and without status migrainosus, not intractable - Plan: CMP14+EGFR, CBC, Rimegepant Sulfate (NURTEC) 75 MG TBDP  Generalized anxiety disorder - Plan: buPROPion (WELLBUTRIN SR) 200 MG 12 hr tablet, sertraline (ZOLOFT) 100 MG tablet  GERD without esophagitis - Plan: famotidine (PEPCID) 20 MG tablet  Encounter for surveillance of contraceptive pills - Plan: levonorgestrel-ethinyl estradiol (AVIANE) 0.1-20 MG-MCG tablet  Moderate episode of recurrent major depressive disorder ([place]) - Plan: buPROPion (WELLBUTRIN SR) 200 MG 12 hr tablet, sertraline (ZOLOFT) 100 MG tablet  Interstitial cystitis  Morbidly obese.  I have given her the handout back to the healthy weight and wellness, which she was very successful with.  Cushingoid body habitus.  Check A1c, CMP, fasting lipid panel, TSH.  She will come in for fasting labs at her earliest convenience.  We will plan to recheck vitamin D given deficiency in the past as well.  Wellbutrin has been restarted per her request.  She will continue Zoloft.  OCPs have been renewed.  Pepcid has  been renewed.  Encouraged twice daily dosing if having ongoing GERD symptoms  Sample of Nurtec provided.  Will renew Maxalt as needed  IC is not currently flared (caused by foods) but denoted.  Counseled on healthy lifestyle choices, including diet (rich in fruits, vegetables and lean meats and low in salt and simple carbohydrates) and exercise (at least 30 minutes of moderate physical activity daily).  Patient to follow up in 1 year for annual exam or sooner if needed.  Derion Kreiter M. Lajuana Ripple, DO

## 2020-10-05 NOTE — Patient Instructions (Addendum)
Please come in for fasting labs at your earliest convenience  Please remember to arrive to your appointment 15 minutes prior to your scheduled slot.  If you are late to future visits I may be unable to accommodate your appointment and you may be asked to reschedule.   Preventive Care 21-37 Years Old, Female Preventive care refers to lifestyle choices and visits with your health care provider that can promote health and wellness. This includes:  A yearly physical exam. This is also called an annual wellness visit.  Regular dental and eye exams.  Immunizations.  Screening for certain conditions.  Healthy lifestyle choices, such as: ? Eating a healthy diet. ? Getting regular exercise. ? Not using drugs or products that contain nicotine and tobacco. ? Limiting alcohol use. What can I expect for my preventive care visit? Physical exam Your health care provider may check your:  Height and weight. These may be used to calculate your BMI (body mass index). BMI is a measurement that tells if you are at a healthy weight.  Heart rate and blood pressure.  Body temperature.  Skin for abnormal spots. Counseling Your health care provider may ask you questions about your:  Past medical problems.  Family's medical history.  Alcohol, tobacco, and drug use.  Emotional well-being.  Home life and relationship well-being.  Sexual activity.  Diet, exercise, and sleep habits.  Work and work environment.  Access to firearms.  Method of birth control.  Menstrual cycle.  Pregnancy history. What immunizations do I need? Vaccines are usually given at various ages, according to a schedule. Your health care provider will recommend vaccines for you based on your age, medical history, and lifestyle or other factors, such as travel or where you work.   What tests do I need? Blood tests  Lipid and cholesterol levels. These may be checked every 5 years starting at age 20.  Hepatitis C  test.  Hepatitis B test. Screening  Diabetes screening. This is done by checking your blood sugar (glucose) after you have not eaten for a while (fasting).  STD (sexually transmitted disease) testing, if you are at risk.  BRCA-related cancer screening. This may be done if you have a family history of breast, ovarian, tubal, or peritoneal cancers.  Pelvic exam and Pap test. This may be done every 3 years starting at age 21. Starting at age 30, this may be done every 5 years if you have a Pap test in combination with an HPV test. Talk with your health care provider about your test results, treatment options, and if necessary, the need for more tests.   Follow these instructions at home: Eating and drinking  Eat a healthy diet that includes fresh fruits and vegetables, whole grains, lean protein, and low-fat dairy products.  Take vitamin and mineral supplements as recommended by your health care provider.  Do not drink alcohol if: ? Your health care provider tells you not to drink. ? You are pregnant, may be pregnant, or are planning to become pregnant.  If you drink alcohol: ? Limit how much you have to 0-1 drink a day. ? Be aware of how much alcohol is in your drink. In the [place] one drink equals one 12 oz bottle of beer (355 mL), one 5 oz glass of wine (148 mL), or one 1 oz glass of hard liquor (44 mL).   Lifestyle  Take daily care of your teeth and gums. Brush your teeth every morning and night with fluoride toothpaste. Floss   one time each day.  Stay active. Exercise for at least 30 minutes 5 or more days each week.  Do not use any products that contain nicotine or tobacco, such as cigarettes, e-cigarettes, and chewing tobacco. If you need help quitting, ask your health care provider.  Do not use drugs.  If you are sexually active, practice safe sex. Use a condom or other form of protection to prevent STIs (sexually transmitted infections).  If you do not wish to become  pregnant, use a form of birth control. If you plan to become pregnant, see your health care provider for a prepregnancy visit.  Find healthy ways to cope with stress, such as: ? Meditation, yoga, or listening to music. ? Journaling. ? Talking to a trusted person. ? Spending time with friends and family. Safety  Always wear your seat belt while driving or riding in a vehicle.  Do not drive: ? If you have been drinking alcohol. Do not ride with someone who has been drinking. ? When you are tired or distracted. ? While texting.  Wear a helmet and other protective equipment during sports activities.  If you have firearms in your house, make sure you follow all gun safety procedures.  Seek help if you have been physically or sexually abused. What's next?  Go to your health care provider once a year for an annual wellness visit.  Ask your health care provider how often you should have your eyes and teeth checked.  Stay up to date on all vaccines. This information is not intended to replace advice given to you by your health care provider. Make sure you discuss any questions you have with your health care provider. Document Revised: 02/09/2020 Document Reviewed: 02/22/2018 Elsevier Patient Education  2021 [employer].

## 2020-12-23 ENCOUNTER — Other Ambulatory Visit: Payer: Self-pay | Admitting: Family Medicine

## 2020-12-23 DIAGNOSIS — R062 Wheezing: Secondary | ICD-10-CM

## 2020-12-23 MED ORDER — ALBUTEROL SULFATE HFA 108 (90 BASE) MCG/ACT IN AERS
2.0000 | INHALATION_SPRAY | Freq: Four times a day (QID) | RESPIRATORY_TRACT | 0 refills | Status: DC | PRN
Start: 2020-12-23 — End: 2020-12-24

## 2020-12-23 MED ORDER — FLUTICASONE PROPIONATE HFA 44 MCG/ACT IN AERO: 1.0000 | INHALATION_SPRAY | Freq: Two times a day (BID) | RESPIRATORY_TRACT | 3 refills | Status: DC

## 2020-12-23 NOTE — Telephone Encounter (Signed)
  Prescription Request  12/23/2020  What is the name of the medication or equipment? Albuterol & Slowvent  Have you contacted your pharmacy to request a refill? (if applicable) no  Which pharmacy would you like this sent to? [facility] (closer to her work)   Patient notified that their request is being sent to the clinical staff for review and that they should receive a response within 2 business days.    Gottschalk's pt

## 2020-12-24 ENCOUNTER — Telehealth: Payer: Self-pay

## 2020-12-24 MED ORDER — PROAIR HFA 108 (90 BASE) MCG/ACT IN AERS: 2.0000 | INHALATION_SPRAY | Freq: Four times a day (QID) | RESPIRATORY_TRACT | 3 refills | Status: DC | PRN

## 2020-12-24 NOTE — Telephone Encounter (Signed)
REFILLED INHALER WITH PROHAIR SO INSURANCE WILL COVER, NO PA NEEDED

## 2021-01-18 ENCOUNTER — Telehealth: Payer: Medicaid Other | Admitting: Nurse Practitioner

## 2021-01-18 ENCOUNTER — Encounter: Payer: Self-pay | Admitting: Nurse Practitioner

## 2021-01-18 DIAGNOSIS — L551 Sunburn of second degree: Secondary | ICD-10-CM | POA: Insufficient documentation

## 2021-01-18 MED ORDER — ACETAMINOPHEN 500 MG PO TABS: 500.0000 mg | ORAL_TABLET | Freq: Four times a day (QID) | ORAL | 0 refills | Status: AC | PRN

## 2021-01-18 MED ORDER — VASELINE PETROLATUM GAUZE EX PADS: 1.0000 | MEDICATED_PAD | Freq: Every day | CUTANEOUS | 0 refills | Status: DC

## 2021-01-18 MED ORDER — CALAMINE EX LOTN: 1.0000 "application " | TOPICAL_LOTION | CUTANEOUS | 0 refills | Status: DC | PRN

## 2021-01-18 NOTE — Assessment & Plan Note (Addendum)
Patient had moderate to severe sunburn few days ago.  Skin is blistering, red and painful.  Advised patient to use cold compress, calamine lotion, Vaseline gauze dressing, and follow-up with worsening or unresolved symptoms.  Patient unable to use ibuprofen due to high interaction with antidepressant.  Education provided to watch for signs and symptoms of skin infection.  Patient verbalized understanding.  Tylenol 500 mg tablet for pain.  Rx sent to pharmacy.
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
## 2021-01-24 DIAGNOSIS — H5213 Myopia, bilateral: Secondary | ICD-10-CM | POA: Diagnosis not present

## 2021-02-11 DIAGNOSIS — R739 Hyperglycemia, unspecified: Secondary | ICD-10-CM | POA: Diagnosis not present

## 2021-02-11 DIAGNOSIS — R81 Glycosuria: Secondary | ICD-10-CM | POA: Diagnosis not present

## 2021-02-11 DIAGNOSIS — R300 Dysuria: Secondary | ICD-10-CM | POA: Diagnosis not present

## 2021-03-12 DIAGNOSIS — N130 Hydronephrosis with ureteropelvic junction obstruction: Secondary | ICD-10-CM | POA: Diagnosis not present

## 2021-03-12 DIAGNOSIS — N200 Calculus of kidney: Secondary | ICD-10-CM | POA: Diagnosis not present

## 2021-03-18 DIAGNOSIS — N202 Calculus of kidney with calculus of ureter: Secondary | ICD-10-CM | POA: Diagnosis not present

## 2021-03-26 ENCOUNTER — Other Ambulatory Visit: Payer: Self-pay | Admitting: Urology

## 2021-03-29 ENCOUNTER — Other Ambulatory Visit: Payer: Self-pay | Admitting: Urology

## 2021-04-01 ENCOUNTER — Other Ambulatory Visit: Payer: Self-pay | Admitting: Family Medicine

## 2021-04-01 DIAGNOSIS — G43009 Migraine without aura, not intractable, without status migrainosus: Secondary | ICD-10-CM

## 2021-04-02 MED ORDER — PROPRANOLOL HCL ER 120 MG PO CP24: 120.0000 mg | ORAL_CAPSULE | Freq: Every day | ORAL | 0 refills | Status: DC

## 2021-04-16 ENCOUNTER — Encounter (HOSPITAL_BASED_OUTPATIENT_CLINIC_OR_DEPARTMENT_OTHER): Payer: Self-pay | Admitting: Urology

## 2021-04-16 ENCOUNTER — Other Ambulatory Visit: Payer: Self-pay

## 2021-04-16 NOTE — Progress Notes (Signed)
Spoke w/ via phone for pre-op interview---pt Lab needs dos----  I stat, ekg, urine poct             Lab results------none COVID test -----patient states asymptomatic no test needed Arrive at -------1100 am 04-20-2021 NPO after MN NO Solid Food.  Clear liquids from MN until---1000 am Med rec completed Medications to take morning of surgery -----flovent, aluterol inhaler prn/bring inhaler, certrizine, oxybutynin prn, nutrec prn Diabetic medication -----none dos, pt checks cbg every few days fasting cbg 155 to 160, after meals cbg 180. Patient instructed no nail polish to be worn day of surgery Patient instructed to bring photo id and insurance card day of surgery Patient aware to have Driver (ride ) / caregiver    for 24 hours after surgery  Patient Special Instructions -----none Pre-Op special Istructions -----none Patient verbalized understanding of instructions that were given at this phone interview. Patient denies shortness of breath, chest pain, fever, cough at this phone interview.

## 2021-04-19 NOTE — Anesthesia Preprocedure Evaluation (Addendum)
Anesthesia Evaluation  Patient identified by MRN, date of birth, ID band Patient awake    Reviewed: Allergy & Precautions, NPO status , Patient's Chart, lab work & pertinent test results  History of Anesthesia Complications Negative for: history of anesthetic complications  Airway Mallampati: II  TM Distance: <3 FB Neck ROM: Full    Dental no notable dental hx.    Pulmonary asthma ,    Pulmonary exam normal        Cardiovascular negative cardio ROS Normal cardiovascular exam     Neuro/Psych  Headaches, Anxiety Depression    GI/Hepatic Neg liver ROS, GERD  Controlled,  Endo/Other  diabetes, Type 2, Oral Hypoglycemic AgentsBMI 38  Renal/GU Renal stones  negative genitourinary   Musculoskeletal negative musculoskeletal ROS (+)   Abdominal   Peds  Hematology negative hematology ROS (+)   Anesthesia Other Findings Day of surgery medications reviewed with patient.  Reproductive/Obstetrics negative OB ROS                            Anesthesia Physical Anesthesia Plan  ASA: 2  Anesthesia Plan: General   Post-op Pain Management:    Induction: Intravenous  PONV Risk Score and Plan: 4 or greater and Treatment may vary due to age or medical condition, Ondansetron, Dexamethasone, Midazolam and Scopolamine patch - Pre-op  Airway Management Planned: LMA  Additional Equipment: None  Intra-op Plan:   Post-operative Plan: Extubation in OR  Informed Consent: I have reviewed the patients History and Physical, chart, labs and discussed the procedure including the risks, benefits and alternatives for the proposed anesthesia with the patient or authorized representative who has indicated his/her understanding and acceptance.     Dental advisory given  Plan Discussed with: CRNA  Anesthesia Plan Comments:        Anesthesia Quick Evaluation

## 2021-04-20 ENCOUNTER — Encounter (HOSPITAL_BASED_OUTPATIENT_CLINIC_OR_DEPARTMENT_OTHER): Payer: Self-pay | Admitting: Urology

## 2021-04-20 ENCOUNTER — Encounter (HOSPITAL_BASED_OUTPATIENT_CLINIC_OR_DEPARTMENT_OTHER)
Admission: RE | Disposition: A | Discharge status: Home / Self Care | Payer: Self-pay | Source: Home / Self Care | Attending: Urology

## 2021-04-20 ENCOUNTER — Ambulatory Visit (HOSPITAL_BASED_OUTPATIENT_CLINIC_OR_DEPARTMENT_OTHER): Payer: Medicaid Other | Admitting: Anesthesiology

## 2021-04-20 ENCOUNTER — Ambulatory Visit (HOSPITAL_BASED_OUTPATIENT_CLINIC_OR_DEPARTMENT_OTHER)
Admission: RE | Admit: 2021-04-20 | Discharge: 2021-04-20 | Disposition: A | Discharge status: Home / Self Care | Payer: Medicaid Other | Attending: Urology | Admitting: Urology

## 2021-04-20 DIAGNOSIS — Z880 Allergy status to penicillin: Secondary | ICD-10-CM | POA: Insufficient documentation

## 2021-04-20 DIAGNOSIS — Z7951 Long term (current) use of inhaled steroids: Secondary | ICD-10-CM | POA: Diagnosis not present

## 2021-04-20 DIAGNOSIS — N132 Hydronephrosis with renal and ureteral calculous obstruction: Secondary | ICD-10-CM | POA: Insufficient documentation

## 2021-04-20 DIAGNOSIS — N1339 Other hydronephrosis: Secondary | ICD-10-CM | POA: Diagnosis not present

## 2021-04-20 DIAGNOSIS — Z8616 Personal history of COVID-19: Secondary | ICD-10-CM | POA: Insufficient documentation

## 2021-04-20 DIAGNOSIS — N202 Calculus of kidney with calculus of ureter: Secondary | ICD-10-CM | POA: Diagnosis not present

## 2021-04-20 DIAGNOSIS — Z7984 Long term (current) use of oral hypoglycemic drugs: Secondary | ICD-10-CM | POA: Insufficient documentation

## 2021-04-20 DIAGNOSIS — K219 Gastro-esophageal reflux disease without esophagitis: Secondary | ICD-10-CM | POA: Diagnosis not present

## 2021-04-20 DIAGNOSIS — Z87442 Personal history of urinary calculi: Secondary | ICD-10-CM | POA: Diagnosis not present

## 2021-04-20 DIAGNOSIS — E119 Type 2 diabetes mellitus without complications: Secondary | ICD-10-CM | POA: Insufficient documentation

## 2021-04-20 DIAGNOSIS — N136 Pyonephrosis: Secondary | ICD-10-CM | POA: Diagnosis not present

## 2021-04-20 DIAGNOSIS — N201 Calculus of ureter: Secondary | ICD-10-CM

## 2021-04-20 HISTORY — DX: Other complications of anesthesia, initial encounter: T88.59XA

## 2021-04-20 HISTORY — DX: Type 2 diabetes mellitus without complications: E11.9

## 2021-04-20 HISTORY — PX: CYSTOSCOPY/URETEROSCOPY/HOLMIUM LASER/STENT PLACEMENT: SHX6546

## 2021-04-20 LAB — POCT PREGNANCY, URINE: Preg Test, Ur: NEGATIVE

## 2021-04-20 LAB — POCT I-STAT, CHEM 8
BUN: 13 mg/dL (ref 6–20)
Calcium, Ion: 0.99 mmol/L — ABNORMAL LOW (ref 1.15–1.40)
Chloride: 107 mmol/L (ref 98–111)
Creatinine, Ser: 0.6 mg/dL (ref 0.44–1.00)
Glucose, Bld: 137 mg/dL — ABNORMAL HIGH (ref 70–99)
HCT: 41 % (ref 36.0–46.0)
Hemoglobin: 13.9 g/dL (ref 12.0–15.0)
Potassium: 4.7 mmol/L (ref 3.5–5.1)
Sodium: 136 mmol/L (ref 135–145)
TCO2: 23 mmol/L (ref 22–32)

## 2021-04-20 LAB — GLUCOSE, CAPILLARY: Glucose-Capillary: 130 mg/dL — ABNORMAL HIGH (ref 70–99)

## 2021-04-20 SURGERY — CYSTOSCOPY/URETEROSCOPY/HOLMIUM LASER/STENT PLACEMENT
Anesthesia: General | Site: Renal | Laterality: Left

## 2021-04-20 MED ORDER — CEFAZOLIN SODIUM-DEXTROSE 2-4 GM/100ML-% IV SOLN
INTRAVENOUS | Status: AC
  Filled 2021-04-20: qty 100

## 2021-04-20 MED ORDER — ACETAMINOPHEN 500 MG PO TABS
1000.0000 mg | ORAL_TABLET | Freq: Once | ORAL | Status: AC
  Administered 2021-04-20: 1000 mg via ORAL

## 2021-04-20 MED ORDER — DEXAMETHASONE SODIUM PHOSPHATE 10 MG/ML IJ SOLN
INTRAMUSCULAR | Status: AC
  Filled 2021-04-20: qty 1

## 2021-04-20 MED ORDER — ACETAMINOPHEN 500 MG PO TABS
ORAL_TABLET | ORAL | Status: AC
  Filled 2021-04-20: qty 2

## 2021-04-20 MED ORDER — SODIUM CHLORIDE 0.9 % IR SOLN
Status: DC | PRN
  Administered 2021-04-20: 1000 mL

## 2021-04-20 MED ORDER — KETOROLAC TROMETHAMINE 30 MG/ML IJ SOLN
INTRAMUSCULAR | Status: DC | PRN
  Administered 2021-04-20: 30 mg via INTRAVENOUS

## 2021-04-20 MED ORDER — SCOPOLAMINE 1 MG/3DAYS TD PT72
1.0000 | MEDICATED_PATCH | Freq: Once | TRANSDERMAL | Status: DC
  Administered 2021-04-20: 1.5 mg via TRANSDERMAL

## 2021-04-20 MED ORDER — ONDANSETRON HCL 4 MG/2ML IJ SOLN
INTRAMUSCULAR | Status: AC
  Filled 2021-04-20: qty 4

## 2021-04-20 MED ORDER — FENTANYL CITRATE (PF) 100 MCG/2ML IJ SOLN
INTRAMUSCULAR | Status: AC
  Filled 2021-04-20: qty 2

## 2021-04-20 MED ORDER — SCOPOLAMINE 1 MG/3DAYS TD PT72
MEDICATED_PATCH | TRANSDERMAL | Status: AC
  Filled 2021-04-20: qty 1

## 2021-04-20 MED ORDER — LACTATED RINGERS IV SOLN: INTRAVENOUS | Status: DC

## 2021-04-20 MED ORDER — FENTANYL CITRATE (PF) 100 MCG/2ML IJ SOLN
INTRAMUSCULAR | Status: DC | PRN
  Administered 2021-04-20 (×2): 50 ug via INTRAVENOUS

## 2021-04-20 MED ORDER — IOHEXOL 300 MG/ML  SOLN
INTRAMUSCULAR | Status: DC | PRN
  Administered 2021-04-20: 8 mL via URETHRAL

## 2021-04-20 MED ORDER — DEXAMETHASONE SODIUM PHOSPHATE 10 MG/ML IJ SOLN
INTRAMUSCULAR | Status: DC | PRN
  Administered 2021-04-20: 10 mg via INTRAVENOUS

## 2021-04-20 MED ORDER — LIDOCAINE 2% (20 MG/ML) 5 ML SYRINGE
INTRAMUSCULAR | Status: DC | PRN
  Administered 2021-04-20: 100 mg via INTRAVENOUS

## 2021-04-20 MED ORDER — LIDOCAINE 2% (20 MG/ML) 5 ML SYRINGE
INTRAMUSCULAR | Status: AC
  Filled 2021-04-20: qty 5

## 2021-04-20 MED ORDER — KETOROLAC TROMETHAMINE 30 MG/ML IJ SOLN
INTRAMUSCULAR | Status: AC
  Filled 2021-04-20: qty 1

## 2021-04-20 MED ORDER — PROPOFOL 10 MG/ML IV BOLUS
INTRAVENOUS | Status: AC
  Filled 2021-04-20: qty 20

## 2021-04-20 MED ORDER — ONDANSETRON HCL 4 MG/2ML IJ SOLN
INTRAMUSCULAR | Status: DC | PRN
  Administered 2021-04-20 (×2): 4 mg via INTRAVENOUS

## 2021-04-20 MED ORDER — MIDAZOLAM HCL 5 MG/5ML IJ SOLN
INTRAMUSCULAR | Status: DC | PRN
  Administered 2021-04-20 (×2): 1 mg via INTRAVENOUS

## 2021-04-20 MED ORDER — CEFAZOLIN SODIUM-DEXTROSE 2-4 GM/100ML-% IV SOLN
2.0000 g | Freq: Once | INTRAVENOUS | Status: AC
  Administered 2021-04-20: 2 g via INTRAVENOUS

## 2021-04-20 MED ORDER — MIDAZOLAM HCL 2 MG/2ML IJ SOLN
INTRAMUSCULAR | Status: AC
  Filled 2021-04-20: qty 2

## 2021-04-20 MED ORDER — PROPOFOL 10 MG/ML IV BOLUS
INTRAVENOUS | Status: DC | PRN
  Administered 2021-04-20: 200 mg via INTRAVENOUS

## 2021-04-20 SURGICAL SUPPLY — 24 items
BAG DRAIN URO-CYSTO SKYTR STRL (DRAIN) ×2 IMPLANT
BAG DRN UROCATH (DRAIN) ×1
BASKET ZERO TIP NITINOL 2.4FR (BASKET) ×2 IMPLANT
BSKT STON RTRVL ZERO TP 2.4FR (BASKET) ×1
CATH URET 5FR 28IN CONE TIP (BALLOONS)
CATH URET 5FR 28IN OPEN ENDED (CATHETERS) ×2 IMPLANT
CATH URET 5FR 70CM CONE TIP (BALLOONS) IMPLANT
CATH URET DUAL LUMEN 6-10FR 50 (CATHETERS) IMPLANT
CLOTH BEACON ORANGE TIMEOUT ST (SAFETY) ×2 IMPLANT
GLOVE SURG ENC MOIS LTX SZ7.5 (GLOVE) ×2 IMPLANT
GLOVE SURG ENC MOIS LTX SZ8 (GLOVE) IMPLANT
GOWN STRL REUS W/TWL LRG LVL3 (GOWN DISPOSABLE) ×2 IMPLANT
GUIDEWIRE ANG ZIPWIRE 038X150 (WIRE) ×2 IMPLANT
GUIDEWIRE STR DUAL SENSOR (WIRE) ×2 IMPLANT
GUIDEWIRE ZIPWRE .038 STRAIGHT (WIRE) ×2 IMPLANT
IV NS IRRIG 3000ML ARTHROMATIC (IV SOLUTION) ×2 IMPLANT
KIT TURNOVER CYSTO (KITS) ×2 IMPLANT
MANIFOLD NEPTUNE II (INSTRUMENTS) ×2 IMPLANT
NS IRRIG 500ML POUR BTL (IV SOLUTION) ×4 IMPLANT
PACK CYSTO (CUSTOM PROCEDURE TRAY) ×2 IMPLANT
SHEATH URETERAL 12FRX28CM (UROLOGICAL SUPPLIES) ×2 IMPLANT
STENT URET 6FRX24 CONTOUR (STENTS) ×2 IMPLANT
TUBE CONNECTING 12X1/4 (SUCTIONS) ×2 IMPLANT
TUBING UROLOGY SET (TUBING) ×2 IMPLANT

## 2021-04-20 NOTE — Transfer of Care (Signed)
Immediate Anesthesia Transfer of Care Note  Patient: Mindi Curling  Procedure(s) Performed: CYSTOSCOPY/RETROGRADE/URETEROSCOPY/BASKET STONE EXTRACTION/STENT PLACEMENT (Left: Renal)  Patient Location: PACU  Anesthesia Type:General  Level of Consciousness: drowsy and patient cooperative  Airway & Oxygen Therapy: Patient Spontanous Breathing and Patient connected to face mask oxygen  Post-op Assessment: Report given to RN and Post -op Vital signs reviewed and stable  Post vital signs: Reviewed and stable  Last Vitals:  Vitals Value Taken Time  BP 106/74 04/20/21 1345  Temp    Pulse 69 04/20/21 1346  Resp 11 04/20/21 1346  SpO2 98 % 04/20/21 1346  Vitals shown include unvalidated device data.  Last Pain:  Vitals:   04/20/21 1150  TempSrc: Oral  PainSc: 0-No pain      Patients Stated Pain Goal: 3 (04/20/21 1150)  Complications: No notable events documented.

## 2021-04-20 NOTE — Op Note (Signed)
Preoperative diagnosis: Left ureteral stone, left renal stone Postoperative diagnosis: Same  Procedure: Cystoscopy with left retrograde pyelogram, left ureteroscopy, stone basket extraction, left ureteral stent placement  Surgeon: Mena Goes  Anesthesia: General  Indication for procedure: Lauren Cardenas is a 35 year old female with a history of stones.  She had left flank pain last month and ultrasound revealed left hydro CT scan revealed a 4 mm left proximal stone.  She was brought today for above procedure.  Findings: On cystoscopy the urethra and bladder were unremarkable.  No stone or foreign body in the bladder.  Left retrograde pyelogram-this outlined a small filling defect in the left distal ureter consistent with the stone and some mild proximal hydronephrosis.  Left ureteroscopy found the stone in the left distal ureter where it was basketed and removed.  Upper tract pyeloscopy revealed the stone in the left lower pole where it was basketed and removed intact.  Description of procedure: After consent was obtained patient brought to the operating room.  After adequate anesthesia she placed in lithotomy position and prepped and draped in the usual sterile fashion.  Timeout was performed to confirm the patient and procedure.  Cystoscope was passed per urethra and the bladder inspected.  Left ureteral orifice cannulated with a 5 [place] open-ended catheter and left retrograde injection of contrast was performed.  A sensor wire was advanced and coiled in the upper pole and the open-ended catheter backed out.  I passed a dual-lumen semirigid ureteroscope along the wire into the distal left ureter where the stone was located.  It was encircled with a 0 tip basket and removed intact without difficulty.  Reinspection of the distal left ureter noted it to be normal and I took the semirigid all the way up to the proximal ureter without difficulty and again ureter was normal and free of any stone or stone  fragment or injury.  A second Glidewire was advanced under direct vision and the semirigid backed out.  I passed a short access sheath over the sensor wire without difficulty.  I then passed a dual-lumen digital ureteroscope into the left kidney and another stone was located in the left lower pole.  It was ensnared with a 0 tip basket and was longer than it was wide and would not pass into the proximal ureter in its current arrangement.  I took it up to the upper pole calyx and dropped it and then regress but where it was now in line with the ureter and it was removed intact without difficulty.  Reinspection of the collecting system noted there to be no other stones.  The access sheath was backed out on the ureteroscope and the collecting system renal pelvis ureter inspected on the way out noted to be stone free and without injury.  The wire was backloaded on the cystoscope and a 6 x 24 cm stent advanced.  The wire was removed with a good coil seen in the kidney and a good coil in the bladder.  The scope was removed and she was awakened taken to cover him in stable condition.  String was tucked.  Complications: None  Blood loss: Minimal  Specimens: Stones to patient/office  Drains: 6 x 24 cm left ureteral stent with string  Disposition: Patient stable to PACU-I went over the procedure with her husband.  We discussed postop care and follow-up.  All questions answered.

## 2021-04-20 NOTE — Anesthesia Postprocedure Evaluation (Signed)
Anesthesia Post Note  Patient: Mindi Curling  Procedure(s) Performed: CYSTOSCOPY/RETROGRADE/URETEROSCOPY/BASKET STONE EXTRACTION/STENT PLACEMENT (Left: Renal)     Patient location during evaluation: PACU Anesthesia Type: General Level of consciousness: awake and alert and oriented Pain management: pain level controlled Vital Signs Assessment: post-procedure vital signs reviewed and stable Respiratory status: spontaneous breathing, nonlabored ventilation and respiratory function stable Cardiovascular status: blood pressure returned to baseline Postop Assessment: no apparent nausea or vomiting Anesthetic complications: no   No notable events documented.  Last Vitals:  Vitals:   04/20/21 1445 04/20/21 1509  BP: 113/74 105/72  Pulse: 68 71  Resp: 10 16  Temp: 36.7 C 36.7 C  SpO2: 95% 95%    Last Pain:  Vitals:   04/20/21 1509  TempSrc:   PainSc: 0-No pain                 Shanda Howells

## 2021-04-20 NOTE — Discharge Instructions (Signed)
Removal of the stent: Remove the stent on Monday morning, 04/26/2021.

## 2021-04-20 NOTE — Anesthesia Procedure Notes (Signed)
Procedure Name: LMA Insertion Date/Time: 04/20/2021 12:54 PM Performed by: Marny Lowenstein, CRNA Pre-anesthesia Checklist: Patient identified, Emergency Drugs available, Suction available and Patient being monitored Patient Re-evaluated:Patient Re-evaluated prior to induction Oxygen Delivery Method: Circle system utilized Preoxygenation: Pre-oxygenation with 100% oxygen Induction Type: IV induction Ventilation: Mask ventilation without difficulty LMA: LMA inserted LMA Size: 4.0 Number of attempts: 1 Airway Equipment and Method: Patient positioned with wedge pillow Placement Confirmation: positive ETCO2 and breath sounds checked- equal and bilateral Tube secured with: Tape Dental Injury: Teeth and Oropharynx as per pre-operative assessment

## 2021-04-20 NOTE — H&P (Signed)
H&P  Chief Complaint: Left ureteral stone, left renal stone  History of Present Illness: Lauren Cardenas is a 35 year old female with history of kidney stones.  She presented with left flank pain and a limited left renal ultrasound March 12, 2021 revealed moderate left hydro and a left lower pole stone.  She failed a trial of observation for presumed stone.  A CT scan March 18, 2021 revealed a left proximal stone of 4 mm and mild proximal hydronephrosis.  She also had a 3 mm left lower pole stone.  There was also a 1 to 2 mm right kidney stone.  She continues to have flank pain and has not seen a stone pass.  She elected to proceed with left ureteroscopy.  She has been well without dysuria or gross hematuria.  Past Medical History:  Diagnosis Date   Chronic migraine    neurologist-  dr Lucia Gaskins   Depression 05/30/2017   with emotional eating   Diabetes mellitus without complication (HCC)    DM type 2   Dysuria    GERD (gastroesophageal reflux disease)    History of acute pyelonephritis    09-27-2015:   11-04-2017  w/ sepsis due to obstruction from kidney stone   History of COVID-19 07/02/2020   mild symptoms x few days all symptoms resolved   History of kidney stones    IC (interstitial cystitis)    Left ureteral stone    OAB (overactive bladder)    Urge urinary incontinence    Vesicoureteral reflux    Past Surgical History:  Procedure Laterality Date   CHOLECYSTECTOMY N/A 10/26/2017   Procedure: LAPAROSCOPIC CHOLECYSTECTOMY WITH INTRAOPERATIVE CHOLANGIOGRAM ERAS PATHWAY;  Surgeon: Harriette Bouillon, MD;  Location: [facility];  Service: General;  Laterality: N/A;   CYSTOSCOPY W/ URETERAL STENT PLACEMENT Right 09/27/2015   Procedure: CYSTOSCOPY WITH RETROGRADE PYELOGRAM/URETERAL STENT PLACEMENT;  Surgeon: Hildred Laser, MD;  Location: [facility];  Service: Urology;  Laterality: Right;   CYSTOSCOPY WITH RETROGRADE PYELOGRAM, URETEROSCOPY AND STENT PLACEMENT Left 09/09/2014   Procedure: CYSTOSCOPY  WITH LEFT RETROGRADE PYELOGRAM, URETEROSCOPY AND STONE EXTRACTION  DOUBLE J STENT PLACEMENT;  Surgeon: Jerilee Field, MD;  Location: [facility];  Service: Urology;  Laterality: Left;   CYSTOSCOPY WITH STENT PLACEMENT Left 11/04/2017   Procedure: CYSTOSCOPY WITH RETROGRADE AND LEFT STENT PLACEMENT;  Surgeon: Crista Elliot, MD;  Location: [facility];  Service: Urology;  Laterality: Left;   CYSTOSCOPY/URETEROSCOPY/HOLMIUM LASER/STENT PLACEMENT Right 10/16/2015   Procedure: RIGHT URETEROSCOPY/HOLMIUM LASER/STENT PLACEMENT;  Surgeon: Jerilee Field, MD;  Location: [facility];  Service: Urology;  Laterality: Right;   CYSTOSCOPY/URETEROSCOPY/HOLMIUM LASER/STENT PLACEMENT Left 11/21/2017   Procedure: CYSTOSCOPY/URETEROSCOPY/HOLMIUM LASER/STENT PLACEMENT;  Surgeon: Jerilee Field, MD;  Location: [facility];  Service: Urology;  Laterality: Left;  ONLY NEEDS 60 MIN   HOLMIUM LASER APPLICATION Left 09/09/2014   Procedure: HOLMIUM LASER APPLICATION;  Surgeon: Jerilee Field, MD;  Location: [facility];  Service: Urology;  Laterality: Left;   HOLMIUM LASER APPLICATION Right 10/16/2015   Procedure: HOLMIUM LASER APPLICATION;  Surgeon: Jerilee Field, MD;  Location: [facility];  Service: Urology;  Laterality: Right;   STONE EXTRACTION WITH BASKET Right 10/16/2015   Procedure: STONE EXTRACTION WITH BASKET;  Surgeon: Jerilee Field, MD;  Location: [facility];  Service: Urology;  Laterality: Right;   WISDOM TOOTH EXTRACTION  03/2014    Home Medications:  Medications Prior to Admission  Medication Sig Dispense Refill Last Dose   acetaminophen (TYLENOL) 500 MG tablet  Take 1 tablet (500 mg total) by mouth every 6 (six) hours as needed. 30 tablet 0 Past Month   cetirizine (ZYRTEC) 10 MG chewable tablet Chew 10 mg by mouth daily. Per pt   04/19/2021   famotidine (PEPCID) 20 MG tablet Take 1 tablet (20 mg total) by mouth 2 (two) times daily. (Patient taking  differently: Take 20 mg by mouth at bedtime.) 180 tablet 3 04/19/2021   ibuprofen (ADVIL) 400 MG tablet Take 400 mg by mouth every 6 (six) hours as needed for mild pain.   Past Week   ketorolac (TORADOL) 10 MG tablet Take 10 mg by mouth every 6 (six) hours as needed.   Past Month   metFORMIN (GLUCOPHAGE) 500 MG tablet Take by mouth at bedtime.   04/19/2021   Misc Natural Products (CRANBERRY/PROBIOTIC) TABS Take 1 tablet by mouth at bedtime.   Past Month   Multiple Vitamin (MULTI-VITAMIN DAILY PO) Take 1 tablet by mouth at bedtime.    Past Month   nitrofurantoin, macrocrystal-monohydrate, (MACROBID) 100 MG capsule Take 100 mg by mouth at bedtime.      OMEGA-3 FATTY ACIDS PO Take by mouth daily. Per pt she was unsure of the dosage.   Past Month   oxybutynin (DITROPAN) 5 MG tablet Take 5 mg by mouth 4 (four) times daily as needed for bladder spasms.   Past Week   PROAIR HFA 108 (90 Base) MCG/ACT inhaler Inhale 2 puffs into the lungs every 6 (six) hours as needed for wheezing or shortness of breath. (Patient taking differently: Inhale 2 puffs into the lungs every 6 (six) hours as needed for wheezing or shortness of breath. albuterol) 8 g 3 Past Month   propranolol ER (INDERAL LA) 120 MG 24 hr capsule Take 1 capsule (120 mg total) by mouth at bedtime. 90 capsule 0 04/19/2021 at 2130   sertraline (ZOLOFT) 100 MG tablet Take 1 tablet (100 mg total) by mouth daily. (Patient taking differently: Take 1 tablet by mouth at bedtime.) 90 tablet 3 04/19/2021   fluticasone (FLOVENT HFA) 44 MCG/ACT inhaler Inhale 1 puff into the lungs 2 (two) times daily. (Patient taking differently: Inhale 1 puff into the lungs 2 (two) times daily.) 31.8 each 3 04/18/2021   Meth-Hyo-M Bl-Na Phos-Ph Sal (URIBEL) 118 MG CAPS as needed.   04/14/2021   Rimegepant Sulfate (NURTEC) 75 MG TBDP Take 1 tablet by mouth daily as needed. 2 tablet 0 More than a month   rizatriptan (MAXALT-MLT) 10 MG disintegrating tablet Take 1 tablet (10 mg  total) by mouth 3 (three) times daily as needed for migraine. 9 tablet 11 More than a month   Allergies:  Allergies  Allergen Reactions   Amoxicillin Hives    ++ got ceftriaxone in the past++ Has patient had a PCN reaction causing immediate rash, facial/tongue/throat swelling, SOB or lightheadedness with hypotension: Yes Has patient had a PCN reaction causing severe rash involving mucus membranes or skin necrosis: No Has patient had a PCN reaction that required hospitalization: No Has patient had a PCN reaction occurring within the last 10 years: Yes If all of the above answers are "NO", then may proceed with Cephalosporin use.  ++ got ceftriaxone in the past++ Has patient had a PCN reaction causing immediate rash, facial/tongue/throat swelling, SOB or lightheadedness with hypotension: Yes Has patient had a PCN reaction causing severe rash involving mucus membranes or skin necrosis: No Has patient had a PCN reaction that required hospitalization: No Has patient had a PCN reaction occurring  within the last 10 years: Yes If all of the above answers are "NO", then may proceed with Cephalosporin use.   Sudafed [Pseudoephedrine Hcl] Shortness Of Breath    Family History  Problem Relation Age of Onset   Hypertension Mother    Obesity Mother    Diabetes Father    Kidney disease Father    Sudden death Father    Social History:  reports that she has never smoked. She has never used smokeless tobacco. She reports that she does not drink alcohol and does not use drugs.  ROS: A complete review of systems was performed.  All systems are negative except for pertinent findings as noted. Review of Systems  All other systems reviewed and are negative.   Physical Exam:  Vital signs in last 24 hours: Temp:  [99 F (37.2 C)] 99 F (37.2 C) (10/25 1150) Pulse Rate:  [70] 70 (10/25 1150) Resp:  [17] 17 (10/25 1150) BP: (118)/(79) 118/79 (10/25 1150) SpO2:  [99 %] 99 % (10/25 1150) Weight:   [90.3 kg] 90.3 kg (10/25 1150) General:  Alert and oriented, No acute distress HEENT: Normocephalic, atraumatic Cardiovascular: Regular rate and rhythm Lungs: Regular rate and effort Abdomen: Soft, nontender, nondistended, no abdominal masses Back: No CVA tenderness Extremities: No edema Neurologic: Grossly intact  Laboratory Data:  Results for orders placed or performed during the hospital encounter of 04/20/21 (from the past 24 hour(s))  Pregnancy, urine POC     Status: None   Collection Time: 04/20/21 11:32 AM  Result Value Ref Range   Preg Test, Ur NEGATIVE NEGATIVE  I-STAT, chem 8     Status: Abnormal   Collection Time: 04/20/21 12:20 PM  Result Value Ref Range   Sodium 136 135 - 145 mmol/L   Potassium 4.7 3.5 - 5.1 mmol/L   Chloride 107 98 - 111 mmol/L   BUN 13 6 - 20 mg/dL   Creatinine, Ser 9.32 0.44 - 1.00 mg/dL   Glucose, Bld 355 (H) 70 - 99 mg/dL   Calcium, Ion 7.32 (L) 1.15 - 1.40 mmol/L   TCO2 23 22 - 32 mmol/L   Hemoglobin 13.9 12.0 - 15.0 g/dL   HCT 20.2 54.2 - 70.6 %   No results found for this or any previous visit (from the past 240 hour(s)). Creatinine: Recent Labs    04/20/21 1220  CREATININE 0.60    Impression/Assessment/plan:  I discussed with the patient the nature, potential benefits, risks and alternatives to cystoscopy with left retrograde, left ureteroscopy, laser lithotripsy and stent placement, including side effects of the proposed treatment, the likelihood of the patient achieving the goals of the procedure, and any potential problems that might occur during the procedure or recuperation.  We discussed she may have passed the stone.  We also discussed she may need a staged procedure with a prestent and repeat ureteroscopy in a few weeks.  All questions answered. Patient elects to proceed.    Jerilee Field 04/20/2021, 12:28 PM

## 2021-04-21 ENCOUNTER — Encounter (HOSPITAL_BASED_OUTPATIENT_CLINIC_OR_DEPARTMENT_OTHER): Payer: Self-pay | Admitting: Urology

## 2021-04-24 ENCOUNTER — Encounter (HOSPITAL_COMMUNITY): Payer: Self-pay

## 2021-04-24 ENCOUNTER — Other Ambulatory Visit: Payer: Self-pay

## 2021-04-24 ENCOUNTER — Emergency Department (HOSPITAL_COMMUNITY)
Admission: EM | Admit: 2021-04-24 | Discharge: 2021-04-25 | Disposition: A | Discharge status: Home / Self Care | Payer: Medicaid Other | Attending: Emergency Medicine | Admitting: Emergency Medicine

## 2021-04-24 DIAGNOSIS — D72829 Elevated white blood cell count, unspecified: Secondary | ICD-10-CM | POA: Insufficient documentation

## 2021-04-24 DIAGNOSIS — G8918 Other acute postprocedural pain: Secondary | ICD-10-CM | POA: Insufficient documentation

## 2021-04-24 DIAGNOSIS — Z79899 Other long term (current) drug therapy: Secondary | ICD-10-CM | POA: Insufficient documentation

## 2021-04-24 DIAGNOSIS — Z8616 Personal history of COVID-19: Secondary | ICD-10-CM | POA: Insufficient documentation

## 2021-04-24 DIAGNOSIS — K219 Gastro-esophageal reflux disease without esophagitis: Secondary | ICD-10-CM | POA: Diagnosis not present

## 2021-04-24 DIAGNOSIS — K760 Fatty (change of) liver, not elsewhere classified: Secondary | ICD-10-CM | POA: Diagnosis not present

## 2021-04-24 DIAGNOSIS — R319 Hematuria, unspecified: Secondary | ICD-10-CM | POA: Diagnosis not present

## 2021-04-24 DIAGNOSIS — Z466 Encounter for fitting and adjustment of urinary device: Secondary | ICD-10-CM | POA: Diagnosis not present

## 2021-04-24 DIAGNOSIS — R109 Unspecified abdominal pain: Secondary | ICD-10-CM | POA: Insufficient documentation

## 2021-04-24 DIAGNOSIS — E1165 Type 2 diabetes mellitus with hyperglycemia: Secondary | ICD-10-CM | POA: Insufficient documentation

## 2021-04-24 DIAGNOSIS — R300 Dysuria: Secondary | ICD-10-CM | POA: Diagnosis not present

## 2021-04-24 DIAGNOSIS — Z7984 Long term (current) use of oral hypoglycemic drugs: Secondary | ICD-10-CM | POA: Insufficient documentation

## 2021-04-24 DIAGNOSIS — N200 Calculus of kidney: Secondary | ICD-10-CM | POA: Diagnosis not present

## 2021-04-24 LAB — URINALYSIS, ROUTINE W REFLEX MICROSCOPIC
Bacteria, UA: NONE SEEN
Bilirubin Urine: NEGATIVE
Glucose, UA: NEGATIVE mg/dL
Ketones, ur: NEGATIVE mg/dL
Nitrite: NEGATIVE
Protein, ur: 100 mg/dL — AB
RBC / HPF: >50 RBC/hpf — ABNORMAL HIGH (ref 0–5)
Specific Gravity, Urine: 1.017 (ref 1.005–1.030)
pH: 6 (ref 5.0–8.0)

## 2021-04-24 LAB — CBC WITH DIFFERENTIAL/PLATELET
Abs Immature Granulocytes: 0.11 10*3/uL — ABNORMAL HIGH (ref 0.00–0.07)
Basophils Absolute: 0.1 10*3/uL (ref 0.0–0.1)
Basophils Relative: 1 %
Eosinophils Absolute: 0.9 10*3/uL — ABNORMAL HIGH (ref 0.0–0.5)
Eosinophils Relative: 6 %
HCT: 42.5 % (ref 36.0–46.0)
Hemoglobin: 13.5 g/dL (ref 12.0–15.0)
Immature Granulocytes: 1 %
Lymphocytes Relative: 20 %
Lymphs Abs: 3.1 10*3/uL (ref 0.7–4.0)
MCH: 25.1 pg — ABNORMAL LOW (ref 26.0–34.0)
MCHC: 31.8 g/dL (ref 30.0–36.0)
MCV: 79 fL — ABNORMAL LOW (ref 80.0–100.0)
Monocytes Absolute: 0.7 10*3/uL (ref 0.1–1.0)
Monocytes Relative: 4 %
Neutro Abs: 11 10*3/uL — ABNORMAL HIGH (ref 1.7–7.7)
Neutrophils Relative %: 68 %
Platelets: 298 10*3/uL (ref 150–400)
RBC: 5.38 MIL/uL — ABNORMAL HIGH (ref 3.87–5.11)
RDW: 14.5 % (ref 11.5–15.5)
WBC: 15.8 10*3/uL — ABNORMAL HIGH (ref 4.0–10.5)
nRBC: 0 % (ref 0.0–0.2)

## 2021-04-24 LAB — COMPREHENSIVE METABOLIC PANEL
ALT: 19 U/L (ref 0–44)
AST: 19 U/L (ref 15–41)
Albumin: 3.9 g/dL (ref 3.5–5.0)
Alkaline Phosphatase: 62 U/L (ref 38–126)
Anion gap: 8 (ref 5–15)
BUN: 20 mg/dL (ref 6–20)
CO2: 27 mmol/L (ref 22–32)
Calcium: 9.2 mg/dL (ref 8.9–10.3)
Chloride: 101 mmol/L (ref 98–111)
Creatinine, Ser: 0.71 mg/dL (ref 0.44–1.00)
GFR, Estimated: >60 mL/min (ref >60)
Glucose, Bld: 138 mg/dL — ABNORMAL HIGH (ref 70–99)
Potassium: 4.1 mmol/L (ref 3.5–5.1)
Sodium: 136 mmol/L (ref 135–145)
Total Bilirubin: 0.6 mg/dL (ref 0.3–1.2)
Total Protein: 7.9 g/dL (ref 6.5–8.1)

## 2021-04-24 LAB — PREGNANCY, URINE: Preg Test, Ur: NEGATIVE

## 2021-04-24 NOTE — ED Provider Notes (Signed)
Emergency Medicine Provider Triage Evaluation Note  Lauren Cardenas , a 35 y.o. female  was evaluated in triage.  Pt complains of left-sided flank pain, dysuria and hematuria.  She underwent a cystoscopy and stent placement on 04/20/2021 and was told to come to the ER if she began having pain or hematuria.  Her symptoms started today.  Denies any fever.  Review of Systems  Positive: Hematuria, flank pain Negative: Fever  Physical Exam  BP (!) 139/95 (BP Location: Right Arm)   Pulse 74   Temp 98.7 F (37.1 C) (Oral)   Resp 18   Ht 5\' 1"  (1.549 m)   Wt 88.5 kg   LMP 03/26/2021   SpO2 96%   BMI 36.84 kg/m  Gen:   Awake, no distress   Resp:  Normal effort  MSK:   Moves extremities without difficulty  Other:  Abdomen is soft  Medical Decision Making  Medically screening exam initiated at 5:45 PM.  Appropriate orders placed.  03/28/2021 was informed that the remainder of the evaluation will be completed by another provider, this initial triage assessment does not replace that evaluation, and the importance of remaining in the ED until their evaluation is complete.  Will obtain lab work and urinalysis   Mindi Curling, Dietrich Pates 04/24/21 1746    04/26/21, MD 04/28/21 [phone]

## 2021-04-24 NOTE — ED Notes (Signed)
Dark green and gold save tubes in lab.

## 2021-04-24 NOTE — ED Triage Notes (Signed)
Patient reports that she had a stent placed in her left ureter 4 days ago. Patient is now having hematuria today and increased pain.

## 2021-04-24 NOTE — ED Notes (Signed)
Attempted to obtain labs, unable, pt states she always has an ultrasound IV due to being difficult stick

## 2021-04-25 ENCOUNTER — Emergency Department (HOSPITAL_COMMUNITY): Payer: Medicaid Other

## 2021-04-25 ENCOUNTER — Encounter (HOSPITAL_COMMUNITY): Payer: Self-pay | Admitting: Student

## 2021-04-25 DIAGNOSIS — R319 Hematuria, unspecified: Secondary | ICD-10-CM | POA: Diagnosis not present

## 2021-04-25 DIAGNOSIS — N200 Calculus of kidney: Secondary | ICD-10-CM | POA: Diagnosis not present

## 2021-04-25 DIAGNOSIS — K760 Fatty (change of) liver, not elsewhere classified: Secondary | ICD-10-CM | POA: Diagnosis not present

## 2021-04-25 DIAGNOSIS — Z466 Encounter for fitting and adjustment of urinary device: Secondary | ICD-10-CM | POA: Diagnosis not present

## 2021-04-25 MED ORDER — HYDROMORPHONE HCL 1 MG/ML IJ SOLN
0.5000 mg | Freq: Once | INTRAMUSCULAR | Status: AC
  Administered 2021-04-25: 0.5 mg via INTRAVENOUS
  Filled 2021-04-25: qty 1

## 2021-04-25 NOTE — ED Provider Notes (Signed)
[facility]-EMERGENCY DEPT Provider Note   CSN: [id number] Arrival date & time: 04/24/21  1719     History Chief Complaint  Patient presents with   Post-op Problem    Lauren Cardenas is a 35 y.o. female with a hx of kidney stones with recent left sided stent placement 04/20/21, OAB, migraines, DM, hyperlipidemia, and depression who presents to the ED with complaints of hematuria that began last night.  Patient relays that she has had some mild discomfort & intermittent dysuria since stent placement, however over the the past 24 hours she has had increased pain to the left flank radiating into the left abdomen with new hematuria.  No alleviating or aggravating factors.  She is post to be taking the stent out on Monday morning.  She denies fever, nausea, vomiting, or diarrhea.    HPI     Past Medical History:  Diagnosis Date   Chronic migraine    neurologist-  dr Lucia Gaskins   Depression 05/30/2017   with emotional eating   Diabetes mellitus without complication (HCC)    DM type 2   Dysuria    GERD (gastroesophageal reflux disease)    History of acute pyelonephritis    09-27-2015:   11-04-2017  w/ sepsis due to obstruction from kidney stone   History of COVID-19 07/02/2020   mild symptoms x few days all symptoms resolved   History of kidney stones    IC (interstitial cystitis)    Left ureteral stone    OAB (overactive bladder)    Urge urinary incontinence    Vesicoureteral reflux     Patient Active Problem List   Diagnosis Date Noted   Sunburn, blistering 01/18/2021   BMI 37.0-37.9, adult 05/06/2019   GERD without esophagitis 08/31/2018   Wheezing 08/31/2018   Depression, major, recurrent (HCC) 04/25/2018   Generalized anxiety disorder 04/04/2018   Pyelonephritis 11/04/2017   Postoperative upper abdominal pain 10/31/2017   Prediabetes 05/30/2017   Vitamin D deficiency 05/30/2017   Other hyperlipidemia 05/30/2017   Migraine without aura and  without status migrainosus, not intractable 05/09/2017   Other fatigue 03/13/2017   Hyperglycemia 03/13/2017   Overactive bladder 11/14/2015   Syncope and collapse 09/27/2015   Nephrolithiasis 09/27/2015   Acute pyelonephritis 09/27/2015   Interstitial cystitis 09/27/2015   Leukocytosis    Vesico-ureteral reflux 09/15/2014   History of kidney stones 07/28/2014   Syncope 03/20/2012    Past Surgical History:  Procedure Laterality Date   CHOLECYSTECTOMY N/A 10/26/2017   Procedure: LAPAROSCOPIC CHOLECYSTECTOMY WITH INTRAOPERATIVE CHOLANGIOGRAM ERAS PATHWAY;  Surgeon: Harriette Bouillon, MD;  Location: [facility];  Service: General;  Laterality: N/A;   CYSTOSCOPY W/ URETERAL STENT PLACEMENT Right 09/27/2015   Procedure: CYSTOSCOPY WITH RETROGRADE PYELOGRAM/URETERAL STENT PLACEMENT;  Surgeon: Hildred Laser, MD;  Location: [facility];  Service: Urology;  Laterality: Right;   CYSTOSCOPY WITH RETROGRADE PYELOGRAM, URETEROSCOPY AND STENT PLACEMENT Left 09/09/2014   Procedure: CYSTOSCOPY WITH LEFT RETROGRADE PYELOGRAM, URETEROSCOPY AND STONE EXTRACTION  DOUBLE J STENT PLACEMENT;  Surgeon: Jerilee Field, MD;  Location: [facility];  Service: Urology;  Laterality: Left;   CYSTOSCOPY WITH STENT PLACEMENT Left 11/04/2017   Procedure: CYSTOSCOPY WITH RETROGRADE AND LEFT STENT PLACEMENT;  Surgeon: Crista Elliot, MD;  Location: [facility];  Service: Urology;  Laterality: Left;   CYSTOSCOPY/URETEROSCOPY/HOLMIUM LASER/STENT PLACEMENT Right 10/16/2015   Procedure: RIGHT URETEROSCOPY/HOLMIUM LASER/STENT PLACEMENT;  Surgeon: Jerilee Field, MD;  Location: [facility];  Service: Urology;  Laterality: Right;  CYSTOSCOPY/URETEROSCOPY/HOLMIUM LASER/STENT PLACEMENT Left 11/21/2017   Procedure: CYSTOSCOPY/URETEROSCOPY/HOLMIUM LASER/STENT PLACEMENT;  Surgeon: Jerilee Field, MD;  Location: [facility];  Service: Urology;  Laterality: Left;  ONLY NEEDS 60 MIN   CYSTOSCOPY/URETEROSCOPY/HOLMIUM  LASER/STENT PLACEMENT Left 04/20/2021   Procedure: CYSTOSCOPY/RETROGRADE/URETEROSCOPY/BASKET STONE EXTRACTION/STENT PLACEMENT;  Surgeon: Jerilee Field, MD;  Location: [facility];  Service: Urology;  Laterality: Left;   HOLMIUM LASER APPLICATION Left 09/09/2014   Procedure: HOLMIUM LASER APPLICATION;  Surgeon: Jerilee Field, MD;  Location: [facility];  Service: Urology;  Laterality: Left;   HOLMIUM LASER APPLICATION Right 10/16/2015   Procedure: HOLMIUM LASER APPLICATION;  Surgeon: Jerilee Field, MD;  Location: [facility];  Service: Urology;  Laterality: Right;   STONE EXTRACTION WITH BASKET Right 10/16/2015   Procedure: STONE EXTRACTION WITH BASKET;  Surgeon: Jerilee Field, MD;  Location: [facility];  Service: Urology;  Laterality: Right;   WISDOM TOOTH EXTRACTION  03/2014     OB History     Gravida  1   Para  1   Term  1   Preterm      AB      Living  1      SAB      IAB      Ectopic      Multiple      Live Births  1           Family History  Problem Relation Age of Onset   Hypertension Mother    Obesity Mother    Diabetes Father    Kidney disease Father    Sudden death Father     Social History   Tobacco Use   Smoking status: Never   Smokeless tobacco: Never  Vaping Use   Vaping Use: Never used  Substance Use Topics   Alcohol use: No   Drug use: No    Home Medications Prior to Admission medications   Medication Sig Start Date End Date Taking? Authorizing Provider  acetaminophen (TYLENOL) 500 MG tablet Take 1 tablet (500 mg total) by mouth every 6 (six) hours as needed. 01/18/21   Daryll Drown, NP  cetirizine (ZYRTEC) 10 MG chewable tablet Chew 10 mg by mouth daily. Per pt    [provider]  famotidine (PEPCID) 20 MG tablet Take 1 tablet (20 mg total) by mouth 2 (two) times daily. Patient taking differently: Take 20 mg by mouth at bedtime. 10/05/20 04/20/21  Delynn Flavin M, DO   fluticasone (FLOVENT HFA) 44 MCG/ACT inhaler Inhale 1 puff into the lungs 2 (two) times daily. Patient taking differently: Inhale 1 puff into the lungs 2 (two) times daily. 12/23/20   Raliegh Ip, DO  ibuprofen (ADVIL) 400 MG tablet Take 400 mg by mouth every 6 (six) hours as needed for mild pain.    [provider]  ketorolac (TORADOL) 10 MG tablet Take 10 mg by mouth every 6 (six) hours as needed.    [provider]  metFORMIN (GLUCOPHAGE) 500 MG tablet Take by mouth at bedtime.    [provider]  Meth-Hyo-M Bl-Na Phos-Ph Sal (URIBEL) 118 MG CAPS as needed. 12/17/18   [provider]  Misc Natural Products (CRANBERRY/PROBIOTIC) TABS Take 1 tablet by mouth at bedtime.    [provider]  Multiple Vitamin (MULTI-VITAMIN DAILY PO) Take 1 tablet by mouth at bedtime.     [provider]  nitrofurantoin, macrocrystal-monohydrate, (MACROBID) 100 MG capsule Take 100 mg by mouth at bedtime.  [provider]  OMEGA-3 FATTY ACIDS PO Take by mouth daily. Per pt she was unsure of the dosage.    [provider]  oxybutynin (DITROPAN) 5 MG tablet Take 5 mg by mouth 4 (four) times daily as needed for bladder spasms.    [provider]  PROAIR HFA [phone] Base) MCG/ACT inhaler Inhale 2 puffs into the lungs every 6 (six) hours as needed for wheezing or shortness of breath. Patient taking differently: Inhale 2 puffs into the lungs every 6 (six) hours as needed for wheezing or shortness of breath. albuterol 12/24/20   Delynn Flavin M, DO  propranolol ER (INDERAL LA) 120 MG 24 hr capsule Take 1 capsule (120 mg total) by mouth at bedtime. 04/02/21   Raliegh Ip, DO  Rimegepant Sulfate (NURTEC) 75 MG TBDP Take 1 tablet by mouth daily as needed. 10/05/20   Raliegh Ip, DO  rizatriptan (MAXALT-MLT) 10 MG disintegrating tablet Take 1 tablet (10 mg total) by mouth 3 (three) times daily as needed for migraine. 09/06/18    Sonny Masters, FNP  sertraline (ZOLOFT) 100 MG tablet Take 1 tablet (100 mg total) by mouth daily. Patient taking differently: Take 1 tablet by mouth at bedtime. 10/05/20   Raliegh Ip, DO    Allergies    Amoxicillin and Sudafed [pseudoephedrine hcl]  Review of Systems   Review of Systems  Constitutional:  Negative for chills and fever.  Respiratory:  Negative for cough and shortness of breath.   Cardiovascular:  Negative for chest pain.  Gastrointestinal:  Positive for abdominal pain. Negative for diarrhea, nausea and vomiting.  Genitourinary:  Positive for dysuria, flank pain and hematuria.  Neurological:  Negative for syncope.  All other systems reviewed and are negative.  Physical Exam Updated Vital Signs BP 117/82 (BP Location: Right Arm)   Pulse 73   Temp 99.6 F (37.6 C) (Oral)   Resp 16   Ht  (1.549 m)   Wt 88.5 kg   LMP 03/26/2021   SpO2 97%   BMI 36.84 kg/m   Physical Exam Vitals and nursing note reviewed.  Constitutional:      General: She is not in acute distress.    Appearance: She is well-developed. She is not toxic-appearing.  HENT:     Head: Normocephalic and atraumatic.  Eyes:     General:        Right eye: No discharge.        Left eye: No discharge.     Conjunctiva/sclera: Conjunctivae normal.  Cardiovascular:     Rate and Rhythm: Normal rate and regular rhythm.  Pulmonary:     Effort: Pulmonary effort is normal. No respiratory distress.     Breath sounds: Normal breath sounds. No wheezing, rhonchi or rales.  Abdominal:     General: There is no distension.     Palpations: Abdomen is soft.     Tenderness: There is abdominal tenderness (mild left sided). There is no guarding or rebound.  Musculoskeletal:     Cervical back: Neck supple.  Skin:    General: Skin is warm and dry.     Findings: No rash.  Neurological:     Mental Status: She is alert.     Comments: Clear speech.   Psychiatric:        Behavior: Behavior normal.     ED Results / Procedures / Treatments   Labs (all labs ordered are listed, but only abnormal results are displayed) Labs Reviewed  COMPREHENSIVE METABOLIC PANEL - Abnormal; Notable for the following components:      Result Value   Glucose, Bld 138 (*)    All other components within normal limits  CBC WITH DIFFERENTIAL/PLATELET - Abnormal; Notable for the following components:   WBC 15.8 (*)    RBC 5.38 (*)    MCV 79.0 (*)    MCH 25.1 (*)    Neutro Abs 11.0 (*)    Eosinophils Absolute 0.9 (*)    Abs Immature Granulocytes 0.11 (*)    All other components within normal limits  URINALYSIS, ROUTINE W REFLEX MICROSCOPIC - Abnormal; Notable for the following components:   Color, Urine RED (*)    APPearance CLOUDY (*)    Hgb urine dipstick LARGE (*)    Protein, ur 100 (*)    Leukocytes,Ua SMALL (*)    RBC / HPF >50 (*)    All other components within normal limits  URINE CULTURE  PREGNANCY, URINE    EKG None  Radiology CT Renal Stone Study  Result Date: 04/25/2021 CLINICAL DATA:  Flank pain, recent left ureteral stent placement, hematuria EXAM: CT ABDOMEN AND PELVIS WITHOUT CONTRAST TECHNIQUE: Multidetector CT imaging of the abdomen and pelvis was performed following the standard protocol without IV contrast. COMPARISON:  03/18/2021 FINDINGS: Lower chest: Lung bases are clear. Hepatobiliary: Mild hepatic steatosis. Status post cholecystectomy. No intrahepatic or extrahepatic ductal dilatation. Pancreas: Within normal limits. Spleen: Within normal limits. Adrenals/Urinary Tract: Adrenal glands are within normal limits. Bilateral renal cortical scarring. 2 mm nonobstructing left upper pole renal calculus (series 2/image 34). 3 mm nonobstructing right upper pole renal calculus (series 2/image 38). Left double-pigtail ureteral stent in satisfactory position. No hydronephrosis. Prior proximal ureteral calculus is no longer visualized. Bladder is within normal limits. Stomach/Bowel: Stomach  is within normal limits. No evidence of bowel obstruction. Normal appendix (series 2/image 59). No colonic wall thickening or inflammatory changes. Vascular/Lymphatic: No evidence of abdominal aortic aneurysm. No suspicious abdominopelvic lymphadenopathy. Reproductive: Uterus is within normal limits. Bilateral ovaries are within normal limits. Other: No abdominopelvic ascites. Musculoskeletal: Visualized osseous structures are within normal limits. IMPRESSION: Left double-pigtail ureteral stent in satisfactory position. No hydronephrosis. Prior proximal ureteral calculus is no longer visualized. Bilateral nonobstructing renal calculi measuring 2-3 mm. Mild hepatic steatosis. Electronically Signed   By: Charline Bills M.D.   On: 04/25/2021 01:35    Procedures Procedures   Medications Ordered in ED Medications - No data to display  ED Course  I have reviewed the triage vital signs and the nursing notes.  Pertinent labs & imaging results that were available during my care of the patient were reviewed by me and considered in my medical decision making (see chart for details).    MDM Rules/Calculators/A&P                           Patient presents to the ED with complaints of hematuria and increased pain. Nontoxic, vitals without significant abnormality. Mild left sided abdominal tenderness on exam, no peritoneal signs.   Additional history obtained:  Additional history obtained from chart review & nursing note review.   Per chart review patient presented with left flank pain and had a limited left renal ultrasound on March 12, 2021 with moderate left hydro and a left lower pole stone, A CT scan March 18, 2021 revealed a left proximal stone of 4 mm and mild proximal hydronephrosis.  She also had a 3 mm left lower  pole stone.  There was also a 1 to 2 mm right kidney stone.  She continued to have flank pain and had not seen a stone pass therefore she underwent cystoscopy with left  retrograde pyelogram, left ureteroscopy, stone basket extraction, left ureteral stent placement 04/20/2021.  Lab Tests:  I Ordered, reviewed, and interpreted labs, which included:  CBC: Mild leukocytosis CMP: Hyperglycemia without acidosis.  Renal function preserved. Urinalysis: Hematuria, small leukocytes, notable squamous epithelial cells, no bacteria, does not appear consistent with infection, culture was sent prior to my assessment.  Imaging Studies ordered:  I ordered imaging studies which included CT renal stone study, I independently reviewed, formal radiology impression shows:  Left double-pigtail ureteral stent in satisfactory position. No hydronephrosis. Prior proximal ureteral calculus is no longer visualized. Bilateral nonobstructing renal calculi measuring 2-3 mm. Mild hepatic steatosis.   ED Course:  Patient likely with pain and hematuria related to her stent placement.  This is in satisfactory position on CT without complicating features.  Urinalysis is not concerning for obvious infection.  Patient is feeling better status post analgesics, overall appears appropriate for discharge home with close urology follow-up. I discussed results, treatment plan, need for follow-up, and return precautions with the patient. Provided opportunity for questions, patient confirmed understanding and is in agreement with plan.   Findings and plan of care discussed with supervising physician Dr. Manus Gunning who is in agreement.   Portions of this note were generated with Scientist, clinical (histocompatibility and immunogenetics). Dictation errors may occur despite best attempts at proofreading.  Final Clinical Impression(s) / ED Diagnoses Final diagnoses:  Hematuria, unspecified type  Post-operative pain    Rx / DC Orders ED Discharge Orders     None        Cherly Anderson, PA-C 04/25/21 0259    Glynn Octave, MD 04/25/21 [phone]

## 2021-04-25 NOTE — Discharge Instructions (Addendum)
You were seen in the emergency department today for blood in your urine as well as pain.  Your labs did show that you do have blood in your urine, however your urine does not look significantly infected, this was sent for culture.  Your CT scan shows that the stent is in appropriate place.  Please call the urology office tomorrow to make them aware of your ER visit and to schedule close follow-up.  Return to the emergency department for new or worsening symptoms including but not limited to new or worsening pain, inability to urinate, fever, increased bleeding, dizziness, passing out, or any other concerns.

## 2021-04-26 ENCOUNTER — Telehealth: Payer: Self-pay

## 2021-04-26 NOTE — Telephone Encounter (Signed)
Transition Care Management Unsuccessful Follow-up Telephone Call  Date of discharge and from where:  04/25/2021 from [facility] ED  Attempts:  1st Attempt  Reason for unsuccessful TCM follow-up call:  Left voice message

## 2021-04-27 LAB — URINE CULTURE: Culture: 20000 — AB

## 2021-04-27 NOTE — Telephone Encounter (Signed)
Transition Care Management Follow-up Telephone Call Date of discharge and from where: 04/24/2021 from [facility] How have you been since you were released from the hospital? Pt stated that she is feeling much better. Pt will follow up with Urologist.  Any questions or concerns? No  Items Reviewed: Did the pt receive and understand the discharge instructions provided? Yes  Medications obtained and verified? Yes  Other? No  Any new allergies since your discharge? No  Dietary orders reviewed? No Do you have support at home? Yes   Functional Questionnaire: (I = Independent and D = Dependent) ADLs: I Bathing/Dressing- I Meal Prep- I Eating- I Maintaining continence- I Transferring/Ambulation- I Managing Meds- I  Follow up appointments reviewed: PCP Hospital f/u appt confirmed? No   Specialist Hospital f/u appt confirmed? Yes  Pt will urologist to see if her appt can be moved up.  Are transportation arrangements needed? No  If their condition worsens, is the pt aware to call PCP or go to the Emergency Dept.? Yes Was the patient provided with contact information for the PCP's office or ED? Yes Was to pt encouraged to call back with questions or concerns? Yes

## 2021-04-28 ENCOUNTER — Telehealth: Payer: Self-pay

## 2021-04-28 NOTE — Telephone Encounter (Signed)
Post ED Visit - Positive Culture Follow-up  Culture report reviewed by antimicrobial stewardship pharmacist: [facility] []  , Pharm.D. []  Enzo Bi, Pharm.D., BCPS AQ-ID []  , Pharm.D., BCPS []  Celedonio Miyamoto, .D., BCPS []  [place], .D., BCPS, AAHIVP []  Georgina Pillion, Pharm.D., BCPS, AAHIVP []  [address], PharmD, BCPS []  , PharmD, BCPS []  [place], PharmD, BCPS []  [address], PharmD []  , PharmD, BCPS []  Estella Husk, PharmD  Pharmacy Team [x]  Lysle Pearl, PharmD []  , PharmD []  Phillips Climes, PharmD []  , Rph []  Agapito Games) , PharmD []  Verlan Friends, PharmD []  , PharmD []  Mervyn Gay, PharmD []  , PharmD []  Vinnie Level, PharmD []  [facility], PharmD []  , PharmD []  Lissa Merlin, PharmD   Positive urine culture NO treatment indicated per ED provider  , PA-C. no further patient follow-up is required at this time.  Greer Pickerel 04/28/2021, 4:33 PM

## 2021-05-14 DIAGNOSIS — N3020 Other chronic cystitis without hematuria: Secondary | ICD-10-CM | POA: Diagnosis not present

## 2021-06-01 DIAGNOSIS — N3946 Mixed incontinence: Secondary | ICD-10-CM | POA: Diagnosis not present

## 2021-06-01 DIAGNOSIS — N130 Hydronephrosis with ureteropelvic junction obstruction: Secondary | ICD-10-CM | POA: Diagnosis not present

## 2021-06-01 DIAGNOSIS — N3020 Other chronic cystitis without hematuria: Secondary | ICD-10-CM | POA: Diagnosis not present

## 2021-06-25 ENCOUNTER — Encounter: Payer: Self-pay | Admitting: Family Medicine

## 2021-06-25 ENCOUNTER — Ambulatory Visit: Payer: Medicaid Other | Admitting: Family Medicine

## 2021-06-25 DIAGNOSIS — J069 Acute upper respiratory infection, unspecified: Secondary | ICD-10-CM | POA: Diagnosis not present

## 2021-06-25 DIAGNOSIS — U071 COVID-19: Secondary | ICD-10-CM

## 2021-06-25 MED ORDER — FLUTICASONE PROPIONATE 50 MCG/ACT NA SUSP: 2.0000 | Freq: Every day | NASAL | 6 refills | Status: DC

## 2021-06-25 MED ORDER — GUAIFENESIN ER 600 MG PO TB12: 1200.0000 mg | ORAL_TABLET | Freq: Two times a day (BID) | ORAL | 0 refills | Status: AC

## 2021-06-25 MED ORDER — MOLNUPIRAVIR EUA 200MG CAPSULE: 4.0000 | ORAL_CAPSULE | Freq: Two times a day (BID) | ORAL | 0 refills | Status: AC

## 2021-06-25 NOTE — Progress Notes (Signed)
Virtual Visit via telephone Note Due to COVID-19 pandemic this visit was conducted virtually. This visit type was conducted due to national recommendations for restrictions regarding the COVID-19 Pandemic (e.g. social distancing, sheltering in place) in an effort to limit this patient's exposure and mitigate transmission in our community. All issues noted in this document were discussed and addressed.  A physical exam was not performed with this format.   I connected with Lauren Cardenas on 06/25/2021 at 1235 by telephone and verified that I am speaking with the correct person using two identifiers. Lauren Cardenas is currently located at home and family is currently with them during visit. The provider, Kari Baars, FNP is located in their office at time of visit.  I discussed the limitations, risks, security and privacy concerns of performing an evaluation and management service by virtual visit and the availability of in person appointments. I also discussed with the patient that there may be a patient responsible charge related to this service. The patient expressed understanding and agreed to proceed.  Subjective:  Patient ID: Lauren Cardenas, female    DOB: 23-Apr-1986, 35 y.o.   MRN: [id number]  Chief Complaint:  Covid Positive   HPI: Lauren Cardenas is a 35 y.o. female presenting on 06/25/2021 for Covid Positive   Pt reports URI symptoms since Thursday. Her husband was positive for COVID so she has been testing at home. States her test was positive on Wednesday night. She woke up Thursday morning with cough, congestion, rhinorrhea, fatigue, and diarrhea. She has been taking Alka-Seltzer cold and cough, zinc, vit C, and vit B3 without relief of symptoms. She has not tried Mucinex or Flonase.   URI  This is a new problem. The current episode started yesterday. The problem has been gradually worsening. Associated symptoms include congestion, coughing, diarrhea, headaches and  rhinorrhea. Pertinent negatives include no abdominal pain, chest pain, nausea, sinus pain, sore throat, vomiting or wheezing. She has tried decongestant for the symptoms. The treatment provided no relief.    Relevant past medical, surgical, family, and social history reviewed and updated as indicated.  Allergies and medications reviewed and updated.   Past Medical History:  Diagnosis Date   Chronic migraine    neurologist-  dr Lucia Gaskins   Depression 05/30/2017   with emotional eating   Diabetes mellitus without complication (HCC)    DM type 2   Dysuria    GERD (gastroesophageal reflux disease)    History of acute pyelonephritis    09-27-2015:   11-04-2017  w/ sepsis due to obstruction from kidney stone   History of COVID-19 07/02/2020   mild symptoms x few days all symptoms resolved   History of kidney stones    IC (interstitial cystitis)    Left ureteral stone    OAB (overactive bladder)    Urge urinary incontinence    Vesicoureteral reflux     Past Surgical History:  Procedure Laterality Date   CHOLECYSTECTOMY N/A 10/26/2017   Procedure: LAPAROSCOPIC CHOLECYSTECTOMY WITH INTRAOPERATIVE CHOLANGIOGRAM ERAS PATHWAY;  Surgeon: Harriette Bouillon, MD;  Location: [facility];  Service: General;  Laterality: N/A;   CYSTOSCOPY W/ URETERAL STENT PLACEMENT Right 09/27/2015   Procedure: CYSTOSCOPY WITH RETROGRADE PYELOGRAM/URETERAL STENT PLACEMENT;  Surgeon: Hildred Laser, MD;  Location: [facility];  Service: Urology;  Laterality: Right;   CYSTOSCOPY WITH RETROGRADE PYELOGRAM, URETEROSCOPY AND STENT PLACEMENT Left 09/09/2014   Procedure: CYSTOSCOPY WITH LEFT RETROGRADE PYELOGRAM, URETEROSCOPY AND STONE EXTRACTION  DOUBLE J STENT PLACEMENT;  Surgeon: Jerilee Field, MD;  Location: [facility];  Service: Urology;  Laterality: Left;   CYSTOSCOPY WITH STENT PLACEMENT Left 11/04/2017   Procedure: CYSTOSCOPY WITH RETROGRADE AND LEFT STENT PLACEMENT;  Surgeon: Crista Elliot, MD;  Location: [facility];  Service:  Urology;  Laterality: Left;   CYSTOSCOPY/URETEROSCOPY/HOLMIUM LASER/STENT PLACEMENT Right 10/16/2015   Procedure: RIGHT URETEROSCOPY/HOLMIUM LASER/STENT PLACEMENT;  Surgeon: Jerilee Field, MD;  Location: [facility];  Service: Urology;  Laterality: Right;   CYSTOSCOPY/URETEROSCOPY/HOLMIUM LASER/STENT PLACEMENT Left 11/21/2017   Procedure: CYSTOSCOPY/URETEROSCOPY/HOLMIUM LASER/STENT PLACEMENT;  Surgeon: Jerilee Field, MD;  Location: [facility];  Service: Urology;  Laterality: Left;  ONLY NEEDS 60 MIN   CYSTOSCOPY/URETEROSCOPY/HOLMIUM LASER/STENT PLACEMENT Left 04/20/2021   Procedure: CYSTOSCOPY/RETROGRADE/URETEROSCOPY/BASKET STONE EXTRACTION/STENT PLACEMENT;  Surgeon: Jerilee Field, MD;  Location: [facility];  Service: Urology;  Laterality: Left;   HOLMIUM LASER APPLICATION Left 09/09/2014   Procedure: HOLMIUM LASER APPLICATION;  Surgeon: Jerilee Field, MD;  Location: [facility];  Service: Urology;  Laterality: Left;   HOLMIUM LASER APPLICATION Right 10/16/2015   Procedure: HOLMIUM LASER APPLICATION;  Surgeon: Jerilee Field, MD;  Location: [facility];  Service: Urology;  Laterality: Right;   STONE EXTRACTION WITH BASKET Right 10/16/2015   Procedure: STONE EXTRACTION WITH BASKET;  Surgeon: Jerilee Field, MD;  Location: [facility];  Service: Urology;  Laterality: Right;   WISDOM TOOTH EXTRACTION  03/2014    Social History   Socioeconomic History   Marital status: Married    Spouse name: [name]   Number of children: 1   Years of education: 12   Highest education level: Not on file  Occupational History   Occupation: [employer]  Tobacco Use   Smoking status: Never   Smokeless tobacco: Never  Vaping Use   Vaping Use: Never used  Substance and Sexual Activity   Alcohol use: No   Drug use: No   Sexual activity: Yes    Birth control/protection: None  Other Topics Concern   Not on file   Social History Narrative   Lives with husband and daughter and in-laws   Daughter, Summer, with nonverbal Autism   Her spouse (who works from home) is very supportive   Caffeine use: 100-150mg /week   She drinks about 3 cups of diet caffeine soda per week   She has been going to [facility]   Social Determinants of Health   Financial Resource Strain: Not on file  Food Insecurity: Not on file  Transportation Needs: Not on file  Physical Activity: Not on file  Stress: Not on file  Social Connections: Not on file  Intimate Partner Violence: Not on file    Outpatient Encounter Medications as of 06/25/2021  Medication Sig   fluticasone (FLONASE) 50 MCG/ACT nasal spray Place 2 sprays into both nostrils daily.   guaiFENesin (MUCINEX) 600 MG 12 hr tablet Take 2 tablets (1,200 mg total) by mouth 2 (two) times daily for 10 days.   molnupiravir EUA (LAGEVRIO) 200 mg CAPS capsule Take 4 capsules (800 mg total) by mouth 2 (two) times daily for 5 days.   acetaminophen (TYLENOL) 500 MG tablet Take 1 tablet (500 mg total) by mouth every 6 (six) hours as needed. (Patient taking differently: Take 500 mg by mouth every 6 (six) hours as needed for mild pain.)   cetirizine (ZYRTEC) 10 MG chewable tablet Chew 10 mg by mouth daily as needed for allergies.   famotidine (PEPCID) 20 MG  tablet Take 1 tablet (20 mg total) by mouth 2 (two) times daily. (Patient taking differently: Take 20 mg by mouth at bedtime.)   fluticasone (FLOVENT HFA) 44 MCG/ACT inhaler Inhale 1 puff into the lungs 2 (two) times daily. (Patient taking differently: Inhale 1 puff into the lungs 2 (two) times daily.)   ibuprofen (ADVIL) 400 MG tablet Take 400 mg by mouth every 6 (six) hours as needed for mild pain.   ketorolac (TORADOL) 10 MG tablet Take 10 mg by mouth every 6 (six) hours as needed.   metFORMIN (GLUCOPHAGE) 500 MG tablet Take 500 mg by mouth at bedtime.   Meth-Hyo-M Bl-Na Phos-Ph Sal (URIBEL) 118 MG CAPS  Take 1 capsule by mouth as needed (for urinary pain/discomfort).   Multiple Vitamin (MULTI-VITAMIN DAILY PO) Take 1 tablet by mouth at bedtime.    nitrofurantoin, macrocrystal-monohydrate, (MACROBID) 100 MG capsule Take 100 mg by mouth as needed (to prevent UTI).   OMEGA-3 FATTY ACIDS PO Take 1,000 mg by mouth daily. Per pt she was unsure of the dosage.   oxybutynin (DITROPAN) 5 MG tablet Take 5 mg by mouth 4 (four) times daily as needed for bladder spasms.   PROAIR HFA 108 (90 Base) MCG/ACT inhaler Inhale 2 puffs into the lungs every 6 (six) hours as needed for wheezing or shortness of breath. (Patient taking differently: Inhale 2 puffs into the lungs every 6 (six) hours as needed for wheezing or shortness of breath. albuterol)   propranolol ER (INDERAL LA) 120 MG 24 hr capsule Take 1 capsule (120 mg total) by mouth at bedtime.   Rimegepant Sulfate (NURTEC) 75 MG TBDP Take 1 tablet by mouth daily as needed. (Patient taking differently: Take 75 mg by mouth daily as needed (for migraines).)   rizatriptan (MAXALT-MLT) 10 MG disintegrating tablet Take 1 tablet (10 mg total) by mouth 3 (three) times daily as needed for migraine.   sertraline (ZOLOFT) 100 MG tablet Take 1 tablet (100 mg total) by mouth daily. (Patient taking differently: Take 1 tablet by mouth at bedtime.)   No facility-administered encounter medications on file as of 06/25/2021.    Allergies  Allergen Reactions   Amoxicillin Hives    ++ got ceftriaxone in the past++ Has patient had a PCN reaction causing immediate rash, facial/tongue/throat swelling, SOB or lightheadedness with hypotension: Yes Has patient had a PCN reaction causing severe rash involving mucus membranes or skin necrosis: No Has patient had a PCN reaction that required hospitalization: No Has patient had a PCN reaction occurring within the last 10 years: Yes If all of the above answers are "NO", then may proceed with Cephalosporin use.  ++ got ceftriaxone in the  past++ Has patient had a PCN reaction causing immediate rash, facial/tongue/throat swelling, SOB or lightheadedness with hypotension: Yes Has patient had a PCN reaction causing severe rash involving mucus membranes or skin necrosis: No Has patient had a PCN reaction that required hospitalization: No Has patient had a PCN reaction occurring within the last 10 years: Yes If all of the above answers are "NO", then may proceed with Cephalosporin use.   Sudafed [Pseudoephedrine Hcl] Shortness Of Breath    Review of Systems  Constitutional:  Positive for activity change, appetite change, chills, fatigue and fever. Negative for diaphoresis and unexpected weight change.  HENT:  Positive for congestion, postnasal drip and rhinorrhea. Negative for sinus pressure, sinus pain and sore throat.   Respiratory:  Positive for cough. Negative for choking, chest tightness, shortness of breath, wheezing and  stridor.   Cardiovascular:  Negative for chest pain, palpitations and leg swelling.  Gastrointestinal:  Positive for diarrhea. Negative for abdominal distention, abdominal pain, anal bleeding, blood in stool, constipation, nausea, rectal pain and vomiting.  Genitourinary:  Negative for decreased urine volume.  Neurological:  Positive for headaches. Negative for dizziness, tremors, seizures, syncope, facial asymmetry, speech difficulty, weakness, light-headedness and numbness.  Psychiatric/Behavioral:  Negative for confusion.   All other systems reviewed and are negative.       Observations/Objective: No vital signs or physical exam, this was a virtual health encounter.  Pt alert and oriented, answers all questions appropriately, and able to speak in full sentences.    Assessment and Plan: Maciah was seen today for covid positive.  Diagnoses and all orders for this visit:  URI with cough and congestion Positive self-administered antigen test for COVID-19 Positive for COVID at home. Onset of  symptoms yesterday. Does work in healthcare setting. Due to underlying comorbidities and BMI, will initiate antiviral therapy along with Mucinex and Flonase. Continue symptomatic care at home. Report any new, worsening, or persistent symptoms.  -     guaiFENesin (MUCINEX) 600 MG 12 hr tablet; Take 2 tablets (1,200 mg total) by mouth 2 (two) times daily for 10 days. -     fluticasone (FLONASE) 50 MCG/ACT nasal spray; Place 2 sprays into both nostrils daily. -     molnupiravir EUA (LAGEVRIO) 200 mg CAPS capsule; Take 4 capsules (800 mg total) by mouth 2 (two) times daily for 5 days.     Follow Up Instructions: Return if symptoms worsen or fail to improve.    I discussed the assessment and treatment plan with the patient. The patient was provided an opportunity to ask questions and all were answered. The patient agreed with the plan and demonstrated an understanding of the instructions.   The patient was advised to call back or seek an in-person evaluation if the symptoms worsen or if the condition fails to improve as anticipated.  The above assessment and management plan was discussed with the patient. The patient verbalized understanding of and has agreed to the management plan. Patient is aware to call the clinic if they develop any new symptoms or if symptoms persist or worsen. Patient is aware when to return to the clinic for a follow-up visit. Patient educated on when it is appropriate to go to the emergency department.    I provided 15 minutes of time during this telephone encounter.   Kari Baars, FNP-C [facility] [address] [place], [place] [address] [phone] 06/25/2021

## 2021-08-05 ENCOUNTER — Encounter: Payer: Self-pay | Admitting: *Deleted

## 2021-08-06 DIAGNOSIS — H66003 Acute suppurative otitis media without spontaneous rupture of ear drum, bilateral: Secondary | ICD-10-CM | POA: Diagnosis not present

## 2021-08-06 DIAGNOSIS — J020 Streptococcal pharyngitis: Secondary | ICD-10-CM | POA: Diagnosis not present

## 2021-08-10 DIAGNOSIS — N3020 Other chronic cystitis without hematuria: Secondary | ICD-10-CM | POA: Diagnosis not present

## 2021-08-20 DIAGNOSIS — H6692 Otitis media, unspecified, left ear: Secondary | ICD-10-CM | POA: Diagnosis not present

## 2021-09-02 DIAGNOSIS — N390 Urinary tract infection, site not specified: Secondary | ICD-10-CM | POA: Diagnosis not present

## 2021-09-09 ENCOUNTER — Other Ambulatory Visit: Payer: Self-pay | Admitting: Family Medicine

## 2021-09-09 MED ORDER — PROPRANOLOL HCL ER 120 MG PO CP24: 120.0000 mg | ORAL_CAPSULE | Freq: Every day | ORAL | 0 refills | Status: DC

## 2021-09-16 DIAGNOSIS — N3001 Acute cystitis with hematuria: Secondary | ICD-10-CM | POA: Diagnosis not present

## 2021-10-03 DIAGNOSIS — N390 Urinary tract infection, site not specified: Secondary | ICD-10-CM | POA: Diagnosis not present

## 2021-10-07 ENCOUNTER — Ambulatory Visit (INDEPENDENT_AMBULATORY_CARE_PROVIDER_SITE_OTHER): Payer: Medicaid Other | Admitting: Family Medicine

## 2021-10-07 ENCOUNTER — Encounter: Payer: Self-pay | Admitting: Family Medicine

## 2021-10-07 VITALS — BP 130/81 | HR 84 | Temp 97.7°F | Ht 61.0 in | Wt 206.0 lb

## 2021-10-07 DIAGNOSIS — G43009 Migraine without aura, not intractable, without status migrainosus: Secondary | ICD-10-CM | POA: Diagnosis not present

## 2021-10-07 DIAGNOSIS — F411 Generalized anxiety disorder: Secondary | ICD-10-CM

## 2021-10-07 DIAGNOSIS — R062 Wheezing: Secondary | ICD-10-CM | POA: Diagnosis not present

## 2021-10-07 DIAGNOSIS — E559 Vitamin D deficiency, unspecified: Secondary | ICD-10-CM

## 2021-10-07 DIAGNOSIS — E119 Type 2 diabetes mellitus without complications: Secondary | ICD-10-CM | POA: Diagnosis not present

## 2021-10-07 DIAGNOSIS — F331 Major depressive disorder, recurrent, moderate: Secondary | ICD-10-CM | POA: Diagnosis not present

## 2021-10-07 DIAGNOSIS — R7303 Prediabetes: Secondary | ICD-10-CM | POA: Diagnosis not present

## 2021-10-07 DIAGNOSIS — Z0001 Encounter for general adult medical examination with abnormal findings: Secondary | ICD-10-CM

## 2021-10-07 DIAGNOSIS — K219 Gastro-esophageal reflux disease without esophagitis: Secondary | ICD-10-CM

## 2021-10-07 DIAGNOSIS — Z0000 Encounter for general adult medical examination without abnormal findings: Secondary | ICD-10-CM

## 2021-10-07 LAB — BAYER DCA HB A1C WAIVED: HB A1C (BAYER DCA - WAIVED): 8 % — ABNORMAL HIGH (ref 4.8–5.6)

## 2021-10-07 MED ORDER — FAMOTIDINE 20 MG PO TABS: 20.0000 mg | ORAL_TABLET | Freq: Every day | ORAL | 3 refills | Status: DC

## 2021-10-07 MED ORDER — PROPRANOLOL HCL ER 120 MG PO CP24: 120.0000 mg | ORAL_CAPSULE | Freq: Every day | ORAL | 3 refills | Status: DC

## 2021-10-07 MED ORDER — SERTRALINE HCL 100 MG PO TABS: 100.0000 mg | ORAL_TABLET | Freq: Every day | ORAL | 3 refills | Status: DC

## 2021-10-07 MED ORDER — FLUTICASONE PROPIONATE HFA 44 MCG/ACT IN AERO: 1.0000 | INHALATION_SPRAY | Freq: Two times a day (BID) | RESPIRATORY_TRACT | 3 refills | Status: DC

## 2021-10-07 NOTE — Progress Notes (Signed)
? ?Lauren Cardenas is a 35 y.o. female presents to office today for annual physical exam examination.   ? ?Concerns today include: ?1.  None.  She reports that she is currently being treated with antibiotics for recurrent UTI.  She has an appointment with her urologist soon.  Was put on hydroxyzine apparently by urology ? 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 DTaP 03/15/1989, 03/12/1990  ? Hepatitis B 04/04/1996, 05/13/1996, 10/15/1996  ? IPV 03/15/1989, 03/12/1990  ? Influenza Split 06/14/2012  ? Influenza,inj,Quad PF,6+ Mos 08/17/2017, 04/04/2018, 05/06/2019  ? MMR 03/12/1990  ? Tdap 06/16/2012  ? ? ? ?Past Medical History:  ?Diagnosis Date  ? Chronic migraine   ? neurologist-  dr ahern  ? Depression 05/30/2017  ? with emotional eating  ? Diabetes mellitus without complication (HCC)   ? DM type 2  ? Dysuria   ? GERD (gastroesophageal reflux disease)   ? History of acute pyelonephritis   ? 09-27-2015:   11-04-2017  w/ sepsis due to obstruction from kidney stone  ? History of COVID-19 07/02/2020  ? mild symptoms x few days all symptoms resolved  ? History of kidney stones   ? IC (interstitial cystitis)   ? Left ureteral stone   ? OAB (overactive bladder)   ? Urge urinary incontinence   ? Vesicoureteral reflux   ? ?Social History  ? ?Socioeconomic History  ? Marital status: Married  ?  Spouse name: Joseph  ? Number of children: 1  ? Years of education: 12  ? Highest education level: Not on file  ?Occupational History  ? Occupation: [employer]  ?Tobacco Use  ? Smoking status: Never  ? Smokeless tobacco: Never  ?Vaping Use  ? Vaping Use: Never used  ?Substance and Sexual Activity  ? Alcohol use: No  ? Drug use: No  ? Sexual  activity: Yes  ?  Birth control/protection: None  ?Other Topics Concern  ? Not on file  ?Social History Narrative  ? Lives with husband and daughter and in-laws  ? Daughter, Summer, with nonverbal Autism  ? Her spouse (who works from home) is very supportive  ? Caffeine use: 100-150mg/week  ? She drinks about 3 cups of diet caffeine soda per week  ? She has been going to [facility]  ? ?Social Determinants of Health  ? ?Financial Resource Strain: Not on file  ?Food Insecurity: Not on file  ?Transportation Needs: Not on file  ?Physical Activity: Not on file  ?Stress: Not on file  ?Social Connections: Not on file  ?Intimate Partner Violence: Not on file  ? ?Past Surgical History:  ?Procedure Laterality Date  ? CHOLECYSTECTOMY N/A 10/26/2017  ? Procedure: LAPAROSCOPIC CHOLECYSTECTOMY WITH INTRAOPERATIVE CHOLANGIOGRAM ERAS PATHWAY;  Surgeon: Cornett, Thomas, MD;  Location: [facility];  Service: General;  Laterality: N/A;  ? CYSTOSCOPY W/ URETERAL STENT PLACEMENT Right 09/27/2015  ? Procedure: CYSTOSCOPY WITH RETROGRADE PYELOGRAM/URETERAL STENT PLACEMENT;  Surgeon: Brian James Budzyn, MD;  Location: [facility];  Service: Urology;  Laterality: Right;  ? CYSTOSCOPY WITH RETROGRADE PYELOGRAM, URETEROSCOPY AND STENT PLACEMENT Left 09/09/2014  ? Procedure: CYSTOSCOPY WITH LEFT RETROGRADE PYELOGRAM, URETEROSCOPY AND STONE EXTRACTION  DOUBLE J STENT PLACEMENT;  Surgeon: Matthew Eskridge, MD;  Location: [facility];    Service: Urology;  Laterality: Left;  ? CYSTOSCOPY WITH STENT PLACEMENT Left 11/04/2017  ? Procedure: CYSTOSCOPY WITH RETROGRADE AND LEFT STENT PLACEMENT;  Surgeon: Lucas Mallow, MD;  Location: [facility];  Service: Urology;  Laterality: Left;  ? CYSTOSCOPY/URETEROSCOPY/HOLMIUM LASER/STENT PLACEMENT Right 10/16/2015  ? Procedure: RIGHT URETEROSCOPY/HOLMIUM LASER/STENT PLACEMENT;  Surgeon: Festus Aloe, MD;  Location: [facility];  Service: Urology;  Laterality: Right;  ?  CYSTOSCOPY/URETEROSCOPY/HOLMIUM LASER/STENT PLACEMENT Left 11/21/2017  ? Procedure: CYSTOSCOPY/URETEROSCOPY/HOLMIUM LASER/STENT PLACEMENT;  Surgeon: Festus Aloe, MD;  Location: [facility];  Service: Urology;  Laterality: Left;  ONLY NEEDS 60 MIN  ? CYSTOSCOPY/URETEROSCOPY/HOLMIUM LASER/STENT PLACEMENT Left 04/20/2021  ? Procedure: CYSTOSCOPY/RETROGRADE/URETEROSCOPY/BASKET STONE EXTRACTION/STENT PLACEMENT;  Surgeon: Festus Aloe, MD;  Location: [facility];  Service: Urology;  Laterality: Left;  ? HOLMIUM LASER APPLICATION Left 08/10/863  ? Procedure: HOLMIUM LASER APPLICATION;  Surgeon: Festus Aloe, MD;  Location: [facility];  Service: Urology;  Laterality: Left;  ? HOLMIUM LASER APPLICATION Right 7/84/6962  ? Procedure: HOLMIUM LASER APPLICATION;  Surgeon: Festus Aloe, MD;  Location: [facility];  Service: Urology;  Laterality: Right;  ? STONE EXTRACTION WITH BASKET Right 10/16/2015  ? Procedure: STONE EXTRACTION WITH BASKET;  Surgeon: Festus Aloe, MD;  Location[facility];  Service: Urology;  Laterality: Right;  ? WISDOM TOOTH EXTRACTION  03/2014  ? ?Family History  ?Problem Relation Age of Onset  ? Hypertension Mother   ? Obesity Mother   ? Diabetes Father   ? Kidney disease Father   ? Sudden death Father   ? ? ?Current Outpatient Medications:  ?  acetaminophen (TYLENOL) 500 MG tablet, Take 1 tablet (500 mg total) by mouth every 6 (six) hours as needed. (Patient taking differently: Take 500 mg by mouth every 6 (six) hours as needed for mild pain.), Disp: 30 tablet, Rfl: 0 ?  cetirizine (ZYRTEC) 10 MG chewable tablet, Chew 10 mg by mouth daily as needed for allergies., Disp: , Rfl:  ?  fluticasone (FLONASE) 50 MCG/ACT nasal spray, Place 2 sprays into both nostrils daily., Disp: 16 g, Rfl: 6 ?  fluticasone (FLOVENT HFA) 44 MCG/ACT inhaler, Inhale 1 puff into the lungs 2 (two) times daily. (Patient taking differently: Inhale 1 puff  into the lungs 2 (two) times daily.), Disp: 31.8 each, Rfl: 3 ?  hydrOXYzine (ATARAX) 10 MG tablet, Take 10 mg by mouth 3 (three) times daily as needed., Disp: , Rfl:  ?  ibuprofen (ADVIL) 400 MG tablet, Take 400 mg by mouth every 6 (six) hours as needed for mild pain., Disp: , Rfl:  ?  metFORMIN (GLUCOPHAGE) 500 MG tablet, Take 500 mg by mouth at bedtime., Disp: , Rfl:  ?  Meth-Hyo-M Bl-Na Phos-Ph Sal (URIBEL) 118 MG CAPS, Take 1 capsule by mouth as needed (for urinary pain/discomfort)., Disp: , Rfl:  ?  Multiple Vitamin (MULTI-VITAMIN DAILY PO), Take 1 tablet by mouth at bedtime. , Disp: , Rfl:  ?  nitrofurantoin, macrocrystal-monohydrate, (MACROBID) 100 MG capsule, Take 100 mg by mouth as needed (to prevent UTI)., Disp: , Rfl:  ?  OMEGA-3 FATTY ACIDS PO, Take 1,000 mg by mouth daily. Per pt she was unsure of the dosage., Disp: , Rfl:  ?  oxybutynin (DITROPAN) 5 MG tablet, Take 5 mg by mouth 4 (four) times daily as needed for bladder spasms., Disp: , Rfl:  ?  PROAIR HFA 108 (90 Base) MCG/ACT inhaler, Inhale 2 puffs into the lungs every 6 (six) hours as  needed for wheezing or shortness of breath. (Patient taking differently: Inhale 2 puffs into the lungs every 6 (six) hours as needed for wheezing or shortness of breath. albuterol), Disp: 8 g, Rfl: 3 ?  propranolol ER (INDERAL LA) 120 MG 24 hr capsule, Take 1 capsule (120 mg total) by mouth at bedtime., Disp: 30 capsule, Rfl: 0 ?  Rimegepant Sulfate (NURTEC) 75 MG TBDP, Take 1 tablet by mouth daily as needed. (Patient taking differently: Take 75 mg by mouth daily as needed (for migraines).), Disp: 2 tablet, Rfl: 0 ?  rizatriptan (MAXALT-MLT) 10 MG disintegrating tablet, Take 1 tablet (10 mg total) by mouth 3 (three) times daily as needed for migraine., Disp: 9 tablet, Rfl: 11 ?  sertraline (ZOLOFT) 100 MG tablet, Take 1 tablet (100 mg total) by mouth daily. (Patient taking differently: Take 1 tablet by mouth at bedtime.), Disp: 90 tablet, Rfl: 3 ?  famotidine  (PEPCID) 20 MG tablet, Take 1 tablet (20 mg total) by mouth 2 (two) times daily. (Patient taking differently: Take 20 mg by mouth at bedtime.), Disp: 180 tablet, Rfl: 3 ? ?Allergies  ?Allergen Reactions  ? Amoxicillin Hives  ?  ++ got ce

## 2021-10-08 ENCOUNTER — Encounter: Payer: Medicaid Other | Admitting: Family Medicine

## 2021-10-08 LAB — CMP14+EGFR
ALT: 16 IU/L (ref 0–32)
AST: 14 IU/L (ref 0–40)
Albumin/Globulin Ratio: 1.5 (ref 1.2–2.2)
Albumin: 4.1 g/dL (ref 3.8–4.8)
Alkaline Phosphatase: 84 IU/L (ref 44–121)
BUN/Creatinine Ratio: 19 (ref 9–23)
BUN: 13 mg/dL (ref 6–20)
Bilirubin Total: 0.2 mg/dL (ref 0.0–1.2)
CO2: 26 mmol/L (ref 20–29)
Calcium: 9.6 mg/dL (ref 8.7–10.2)
Chloride: 101 mmol/L (ref 96–106)
Creatinine, Ser: 0.7 mg/dL (ref 0.57–1.00)
Globulin, Total: 2.8 g/dL (ref 1.5–4.5)
Glucose: 181 mg/dL — ABNORMAL HIGH (ref 70–99)
Potassium: 5 mmol/L (ref 3.5–5.2)
Sodium: 141 mmol/L (ref 134–144)
Total Protein: 6.9 g/dL (ref 6.0–8.5)
eGFR: 114 mL/min/{1.73_m2} (ref >59)

## 2021-10-08 LAB — CBC
Hematocrit: 39.6 % (ref 34.0–46.6)
Hemoglobin: 13 g/dL (ref 11.1–15.9)
MCH: 25.6 pg — ABNORMAL LOW (ref 26.6–33.0)
MCHC: 32.8 g/dL (ref 31.5–35.7)
MCV: 78 fL — ABNORMAL LOW (ref 79–97)
Platelets: 282 10*3/uL (ref 150–450)
RBC: 5.07 x10E6/uL (ref 3.77–5.28)
RDW: 14.6 % (ref 11.7–15.4)
WBC: 11.3 10*3/uL — ABNORMAL HIGH (ref 3.4–10.8)

## 2021-10-08 LAB — LIPID PANEL
Chol/HDL Ratio: 3.1 ratio (ref 0.0–4.4)
Cholesterol, Total: 171 mg/dL (ref 100–199)
HDL: 55 mg/dL (ref >39)
LDL Chol Calc (NIH): 77 mg/dL (ref 0–99)
Triglycerides: 239 mg/dL — ABNORMAL HIGH (ref 0–149)
VLDL Cholesterol Cal: 39 mg/dL (ref 5–40)

## 2021-10-08 LAB — TSH: TSH: 1.8 u[IU]/mL (ref 0.450–4.500)

## 2021-10-08 LAB — VITAMIN D 25 HYDROXY (VIT D DEFICIENCY, FRACTURES): Vit D, 25-Hydroxy: 24.9 ng/mL — ABNORMAL LOW (ref 30.0–100.0)

## 2021-10-12 ENCOUNTER — Ambulatory Visit: Payer: Medicaid Other | Admitting: Family Medicine

## 2021-10-12 ENCOUNTER — Encounter: Payer: Self-pay | Admitting: Family Medicine

## 2021-10-12 VITALS — BP 128/79 | HR 85 | Temp 97.5°F | Ht 61.0 in | Wt 203.6 lb

## 2021-10-12 DIAGNOSIS — E1169 Type 2 diabetes mellitus with other specified complication: Secondary | ICD-10-CM

## 2021-10-12 DIAGNOSIS — E119 Type 2 diabetes mellitus without complications: Secondary | ICD-10-CM | POA: Insufficient documentation

## 2021-10-12 DIAGNOSIS — E785 Hyperlipidemia, unspecified: Secondary | ICD-10-CM | POA: Diagnosis not present

## 2021-10-12 DIAGNOSIS — E1129 Type 2 diabetes mellitus with other diabetic kidney complication: Secondary | ICD-10-CM | POA: Insufficient documentation

## 2021-10-12 MED ORDER — OZEMPIC (0.25 OR 0.5 MG/DOSE) 2 MG/1.5ML ~~LOC~~ SOPN: PEN_INJECTOR | SUBCUTANEOUS | 0 refills | Status: AC

## 2021-10-12 MED ORDER — LANCETS MISC: 3 refills | Status: AC

## 2021-10-12 MED ORDER — ROSUVASTATIN CALCIUM 5 MG PO TABS: 5.0000 mg | ORAL_TABLET | Freq: Every day | ORAL | 3 refills | Status: DC

## 2021-10-12 MED ORDER — BLOOD GLUCOSE METER KIT: PACK | 0 refills | Status: AC

## 2021-10-12 MED ORDER — GLUCOSE BLOOD VI STRP: ORAL_STRIP | 3 refills | Status: AC

## 2021-10-12 NOTE — Progress Notes (Signed)
? ?Subjective: ?CC:new onset DM ?PCP: Janora Norlander, DO ?GDJ:MEQASTM D Lauren Cardenas is a 36 y.o. female presenting to clinic today for: ? ?1. Type 2 Diabetes with morbid obesity, hyperlipidemia:  ?This is a new diagnosis for patient.  She is treated with metformin already for prediabetes.  She has not yet seen her eye doctor but we will plan to inform of her new diagnosis soon.  No reports of foot ulcerations or sensory changes.  Her father was a really bad diabetic who unfortunately had amputations, vision loss and was on hemodialysis before he passed away due to complications of uncontrolled diabetes.  She understands repercussions of uncontrolled blood sugar.  She is not currently on any cholesterol-lowering medications but was noted to have elevated triglycerides on last visit.  She is interested in the GLP class of treatment for assistance with weight loss as well would like to proceed with that.  No personal or family history of medullary thyroid cancers, multiple endocrine type II neoplasia.  She has never had pancreatitis.  Not currently using any form of birth control but is sexually active with her spouse. ? ?Last eye exam: needs ?Last foot exam: needs ?Last A1c:  ?Lab Results  ?Component Value Date  ? HGBA1C 8.0 (H) 10/07/2021  ? ?Nephropathy screen indicated?: needs ?Last flu, zoster and/or pneumovax:  ?Immunization History  ?Administered Date(s) Administered  ? DTaP 03/15/1989, 03/12/1990  ? Hepatitis B 04/04/1996, 05/13/1996, 10/15/1996  ? IPV 03/15/1989, 03/12/1990  ? Influenza Split 06/14/2012  ? Influenza,inj,Quad PF,6+ Mos 08/17/2017, 04/04/2018, 05/06/2019  ? MMR 03/12/1990  ? Tdap 06/16/2012  ? ?ROS: Per HPI ? ?Allergies  ?Allergen Reactions  ? Amoxicillin Hives  ?  ++ got ceftriaxone in the past++ ?Has patient had a PCN reaction causing immediate rash, facial/tongue/throat swelling, SOB or lightheadedness with hypotension: Yes ?Has patient had a PCN reaction causing severe rash involving mucus  membranes or skin necrosis: No ?Has patient had a PCN reaction that required hospitalization: No ?Has patient had a PCN reaction occurring within the last 10 years: Yes ?If all of the above answers are "NO", then may proceed with Cephalosporin use. ? ?++ got ceftriaxone in the past++ ?Has patient had a PCN reaction causing immediate rash, facial/tongue/throat swelling, SOB or lightheadedness with hypotension: Yes ?Has patient had a PCN reaction causing severe rash involving mucus membranes or skin necrosis: No ?Has patient had a PCN reaction that required hospitalization: No ?Has patient had a PCN reaction occurring within the last 10 years: Yes ?If all of the above answers are "NO", then may proceed with Cephalosporin use.  ? Sudafed [Pseudoephedrine Hcl] Shortness Of Breath  ? ?Past Medical History:  ?Diagnosis Date  ? Chronic migraine   ? neurologist-  dr Jaynee Eagles  ? Depression 05/30/2017  ? with emotional eating  ? Diabetes mellitus without complication ([place])   ? DM type 2  ? Dysuria   ? GERD (gastroesophageal reflux disease)   ? History of acute pyelonephritis   ? 09-27-2015:   11-04-2017  w/ sepsis due to obstruction from kidney stone  ? History of COVID-19 07/02/2020  ? mild symptoms x few days all symptoms resolved  ? History of kidney stones   ? IC (interstitial cystitis)   ? Left ureteral stone   ? OAB (overactive bladder)   ? Urge urinary incontinence   ? Vesicoureteral reflux   ? ? ?Current Outpatient Medications:  ?  acetaminophen (TYLENOL) 500 MG tablet, Take 1 tablet (500 mg total) by mouth  every 6 (six) hours as needed. (Patient taking differently: Take 500 mg by mouth every 6 (six) hours as needed for mild pain.), Disp: 30 tablet, Rfl: 0 ?  cetirizine (ZYRTEC) 10 MG chewable tablet, Chew 10 mg by mouth daily as needed for allergies., Disp: , Rfl:  ?  famotidine (PEPCID) 20 MG tablet, Take 1 tablet (20 mg total) by mouth daily. For acid reflux, Disp: 100 tablet, Rfl: 3 ?  fluticasone (FLONASE) 50  MCG/ACT nasal spray, Place 2 sprays into both nostrils daily., Disp: 16 g, Rfl: 6 ?  fluticasone (FLOVENT HFA) 44 MCG/ACT inhaler, Inhale 1 puff into the lungs 2 (two) times daily., Disp: 31.8 each, Rfl: 3 ?  hydrOXYzine (ATARAX) 10 MG tablet, Take 10 mg by mouth 3 (three) times daily as needed., Disp: , Rfl:  ?  metFORMIN (GLUCOPHAGE) 500 MG tablet, Take 500 mg by mouth at bedtime., Disp: , Rfl:  ?  Meth-Hyo-M Bl-Na Phos-Ph Sal (URIBEL) 118 MG CAPS, Take 1 capsule by mouth as needed (for urinary pain/discomfort)., Disp: , Rfl:  ?  Multiple Vitamin (MULTI-VITAMIN DAILY PO), Take 1 tablet by mouth at bedtime. , Disp: , Rfl:  ?  nitrofurantoin, macrocrystal-monohydrate, (MACROBID) 100 MG capsule, Take 100 mg by mouth as needed (to prevent UTI)., Disp: , Rfl:  ?  OMEGA-3 FATTY ACIDS PO, Take 1,000 mg by mouth daily. Per pt she was unsure of the dosage., Disp: , Rfl:  ?  oxybutynin (DITROPAN) 5 MG tablet, Take 5 mg by mouth 4 (four) times daily as needed for bladder spasms., Disp: , Rfl:  ?  PROAIR HFA 108 (90 Base) MCG/ACT inhaler, Inhale 2 puffs into the lungs every 6 (six) hours as needed for wheezing or shortness of breath. (Patient taking differently: Inhale 2 puffs into the lungs every 6 (six) hours as needed for wheezing or shortness of breath. albuterol), Disp: 8 g, Rfl: 3 ?  propranolol ER (INDERAL LA) 120 MG 24 hr capsule, Take 1 capsule (120 mg total) by mouth at bedtime., Disp: 100 capsule, Rfl: 3 ?  Rimegepant Sulfate (NURTEC) 75 MG TBDP, Take 1 tablet by mouth daily as needed. (Patient taking differently: Take 75 mg by mouth daily as needed (for migraines).), Disp: 2 tablet, Rfl: 0 ?  rizatriptan (MAXALT-MLT) 10 MG disintegrating tablet, Take 1 tablet (10 mg total) by mouth 3 (three) times daily as needed for migraine., Disp: 9 tablet, Rfl: 11 ?  rosuvastatin (CRESTOR) 5 MG tablet, Take 1 tablet (5 mg total) by mouth daily., Disp: 90 tablet, Rfl: 3 ?  Semaglutide,0.25 or 0.5MG/DOS, (OZEMPIC, 0.25 OR 0.5  MG/DOSE,) 2 MG/1.5ML SOPN, Inject 0.25 mg into the skin every 7 (seven) days for 28 days, THEN 0.5 mg every 7 (seven) days., Disp: 4.5 mL, Rfl: 0 ?  sertraline (ZOLOFT) 100 MG tablet, Take 1 tablet (100 mg total) by mouth daily., Disp: 100 tablet, Rfl: 3 ?Social History  ? ?Socioeconomic History  ? Marital status: Married  ?  Spouse name: Broadus John  ? Number of children: 1  ? Years of education: 17  ? Highest education level: Not on file  ?Occupational History  ? 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 Smoking status: Never  ? Smokeless tobacco: Never  ?Vaping Use  ? Vaping Use: Never used  ?Substance and Sexual Activity  ? Alcohol use: No  ? Drug use: No  ? Sexual activity: Yes  ?  Birth control/protection: None  ?Other Topics Concern  ? Not on file  ?  Social History Narrative  ? Lives with husband and daughter and in-laws  ? Daughter, Summer, with nonverbal Autism  ? Her spouse (who works from home) is very supportive  ? Caffeine use: 100-169m/week  ? She drinks about 3 cups of diet caffeine soda per week  ? She has been going [place] [facility] ? ?Social Determinants of Health  ? ?Financial Resource Strain: Not on file  ?Food Insecurity: Not on file  ?Transportation Needs: Not on file  ?Physical Activity: Not on file  ?Stress: Not on file  ?Social Connections: Not on file  ?Intimate Partner Violence: Not on file  ? ?Family History  ?Problem Relation Age of Onset  ? Hypertension Mother   ? Obesity Mother   ? Diabetes Father   ? Kidney disease Father   ? Sudden death Father   ? ? ?Objective: ?Office vital signs reviewed. ?BP 128/79   Pulse 85   Temp (!) 97.5 ?F (36.4 ?C)   Ht _0  (1.549 m)   Wt 203 lb 9.6 oz (92.4 kg)   LMP 07/16/2021 (Exact Date)   SpO2 98%   BMI 38.47 kg/m?  ? ?Physical Examination:  ?General: Awake, alert, morbidly obese, No acute distress ?HEENT: sclera white, MMM ?Cardio: regular rate and rhythm  ?Pulm: normal WOB on room air ?MSK: normal gait and  station ?Neuro: See DM foot ? ?Diabetic Foot Exam - Simple   ?Simple Foot Form ?Diabetic Foot exam was performed with the following findings: Yes 10/12/2021  8:59 AM  ?Visual Inspection ?No deformities, no ulcerations, no ot

## 2021-10-12 NOTE — Patient Instructions (Addendum)
Ozempic: inject once every 7 days for sugar and weight. ?Rosuvastatin: take 1 tablet each night for cholesterol ?AVOID: carbonated beverages, large meals, heavy meals. ? ?Diabetes Mellitus and Standards of Medical Care ?Living with and managing diabetes (diabetes mellitus) can be complicated. Your diabetes treatment may be managed by a team of health care providers, including: ?A physician who specializes in diabetes (endocrinologist). You might also have visits with a nurse practitioner or physician assistant. ?Nurses. ?A registered dietitian. ?A certified diabetes care and education specialist. ?An exercise specialist. ?A pharmacist. ?An eye doctor. ?A foot specialist (podiatrist). ?A dental care provider. ?A primary care provider. ?A mental health care provider. ?How to manage your diabetes ?You can do many things to successfully manage your diabetes. Your health care providers will follow guidelines to help you get the best quality of care. Here are general guidelines for your diabetes management plan. Your health care providers may give you more specific instructions. ?Physical exams ?When you are diagnosed with diabetes, and each year after that, your health care provider will ask about your medical and family history. You will have a physical exam, which may include: ?Measuring your height, weight, and body mass index (BMI). ?Checking your blood pressure. This will be done at every routine medical visit. Your target blood pressure may vary depending on your medical conditions, your age, and other factors. ?A thyroid exam. ?A skin exam. ?Screening for nerve damage (peripheral neuropathy). This may include checking the pulse in your legs and feet and the level of sensation in your hands and feet. ?A foot exam to inspect the structure and skin of your feet, including checking for cuts, bruises, redness, blisters, sores, or other problems. ?Screening for blood vessel (vascular) problems. This may include checking  the pulse in your legs and feet and checking your temperature. ?Blood tests ?Depending on your treatment plan and your personal needs, you may have the following tests: ?Hemoglobin A1C (HbA1C). This test provides information about blood sugar (glucose) control over the previous 2-3 months. It is used to adjust your treatment plan, if needed. This test will be done: ?At least 2 times a year, if you are meeting your treatment goals. ?4 times a year, if you are not meeting your treatment goals or if your goals have changed. ?Lipid testing, including total cholesterol, LDL and HDL cholesterol, and triglyceride levels. ?The goal for LDL is less than 100 mg/dL (5.5 mmol/L). If you are at high risk for complications, the goal is less than 70 mg/dL (3.9 mmol/L). ?The goal for HDL is 40 mg/dL (2.2 mmol/L) or higher for men, and 50 mg/dL (2.8 mmol/L) or higher for women. An HDL cholesterol of 60 mg/dL (3.3 mmol/L) or higher gives some protection against heart disease. ?The goal for triglycerides is less than 150 mg/dL (8.3 mmol/L). ?Liver function tests. ?Kidney function tests. ?Thyroid function tests. ? ?Dental and eye exams ? ?Visit your dentist two times a year. ?If you have type 1 diabetes, your health care provider may recommend an eye exam within 5 years after you are diagnosed, and then once a year after your first exam. ?For children with type 1 diabetes, the health care provider may recommend an eye exam when your child is age 18 or older and has had diabetes for 3-5 years. After the first exam, your child should get an eye exam once a year. ?If you have type 2 diabetes, your health care provider may recommend an eye exam as soon as you are diagnosed, and  then every 1-2 years after your first exam. ?Immunizations ?A yearly flu (influenza) vaccine is recommended annually for everyone 6 months or older. This is especially important if you have diabetes. ?The pneumonia (pneumococcal) vaccine is recommended for everyone  2 years or older who has diabetes. If you are age 62 or older, you may get the pneumonia vaccine as a series of two separate shots. ?The hepatitis B vaccine is recommended for adults shortly after being diagnosed with diabetes. ?Adults and children with diabetes should receive all other vaccines according to age-specific recommendations from the Centers for Disease Control and Prevention (CDC). ?Mental and emotional health ?Screening for symptoms of eating disorders, anxiety, and depression is recommended at the time of diagnosis and after as needed. If your screening shows that you have symptoms, you may need more evaluation. You may work with a mental health care provider. ?Follow these instructions at home: ?Treatment plan ?You will monitor your blood glucose levels and may give yourself insulin. Your treatment plan will be reviewed at every medical visit. You and your health care provider will discuss: ?How you are taking your medicines, including insulin. ?Any side effects you have. ?Your blood glucose level target goals. ?How often you monitor your blood glucose level. ?Lifestyle habits, such as activity level and tobacco, alcohol, and substance use. ?Education ?Your health care provider will assess how well you are monitoring your blood glucose levels and whether you are taking your insulin and medicines correctly. He or she may refer you to: ?A certified diabetes care and education specialist to manage your diabetes throughout your life, starting at diagnosis. ?A registered dietitian who can create and review your personal nutrition plan. ?An exercise specialist who can discuss your activity level and exercise plan. ?General instructions ?Take over-the-counter and prescription medicines only as told by your health care provider. ?Keep all follow-up visits. This is important. ?Where to find support ?There are many diabetes support networks, including: ?American Diabetes Association (ADA): diabetes.org ?Defeat  Diabetes Foundation: defeatdiabetes.org ?Where to find more information ?American Diabetes Association (ADA): www.diabetes.org ?Association of Diabetes Care & Education Specialists (ADCES): diabeteseducator.org ?International Diabetes Federation (IDF): http://hill.biz/ ?Summary ?Managing diabetes (diabetes mellitus) can be complicated. Your diabetes treatment may be managed by a team of health care providers. ?Your health care providers follow guidelines to help you get the best quality care. ?You should have physical exams, blood tests, blood pressure monitoring, immunizations, and screening tests regularly. Stay updated on how to manage your diabetes. ?Your health care providers may also give you more specific instructions based on your individual health. ?This information is not intended to replace advice given to you by your health care provider. Make sure you discuss any questions you have with your health care provider. ?Document Revised: 12/19/2019 Document Reviewed: 12/19/2019 ?Elsevier Patient Education ? 2023 [employer] ? ?

## 2021-10-13 LAB — MICROALBUMIN / CREATININE URINE RATIO
Creatinine, Urine: 120.1 mg/dL
Microalb/Creat Ratio: 556 mg/g creat — ABNORMAL HIGH (ref 0–29)
Microalbumin, Urine: 667.8 ug/mL

## 2021-10-20 DIAGNOSIS — N390 Urinary tract infection, site not specified: Secondary | ICD-10-CM | POA: Diagnosis not present

## 2021-11-04 DIAGNOSIS — N3020 Other chronic cystitis without hematuria: Secondary | ICD-10-CM | POA: Diagnosis not present

## 2021-11-04 DIAGNOSIS — R8271 Bacteriuria: Secondary | ICD-10-CM | POA: Diagnosis not present

## 2021-11-07 ENCOUNTER — Encounter: Payer: Self-pay | Admitting: Family Medicine

## 2021-11-29 ENCOUNTER — Encounter (HOSPITAL_BASED_OUTPATIENT_CLINIC_OR_DEPARTMENT_OTHER): Payer: Self-pay | Admitting: Emergency Medicine

## 2021-11-29 ENCOUNTER — Inpatient Hospital Stay (HOSPITAL_BASED_OUTPATIENT_CLINIC_OR_DEPARTMENT_OTHER)
Admission: EM | Admit: 2021-11-29 | Discharge: 2021-12-02 | DRG: 690 | Disposition: A | Discharge status: Home / Self Care | Payer: Medicaid Other | Attending: Internal Medicine | Admitting: Internal Medicine

## 2021-11-29 ENCOUNTER — Other Ambulatory Visit: Payer: Self-pay

## 2021-11-29 ENCOUNTER — Emergency Department (HOSPITAL_BASED_OUTPATIENT_CLINIC_OR_DEPARTMENT_OTHER): Payer: Medicaid Other

## 2021-11-29 DIAGNOSIS — N12 Tubulo-interstitial nephritis, not specified as acute or chronic: Secondary | ICD-10-CM | POA: Diagnosis present

## 2021-11-29 DIAGNOSIS — F411 Generalized anxiety disorder: Secondary | ICD-10-CM | POA: Diagnosis present

## 2021-11-29 DIAGNOSIS — Z880 Allergy status to penicillin: Secondary | ICD-10-CM

## 2021-11-29 DIAGNOSIS — E1169 Type 2 diabetes mellitus with other specified complication: Secondary | ICD-10-CM | POA: Diagnosis present

## 2021-11-29 DIAGNOSIS — R6510 Systemic inflammatory response syndrome (SIRS) of non-infectious origin without acute organ dysfunction: Secondary | ICD-10-CM

## 2021-11-29 DIAGNOSIS — Z888 Allergy status to other drugs, medicaments and biological substances status: Secondary | ICD-10-CM | POA: Diagnosis not present

## 2021-11-29 DIAGNOSIS — J45909 Unspecified asthma, uncomplicated: Secondary | ICD-10-CM | POA: Diagnosis present

## 2021-11-29 DIAGNOSIS — Z23 Encounter for immunization: Secondary | ICD-10-CM

## 2021-11-29 DIAGNOSIS — A409 Streptococcal sepsis, unspecified: Secondary | ICD-10-CM | POA: Diagnosis not present

## 2021-11-29 DIAGNOSIS — K219 Gastro-esophageal reflux disease without esophagitis: Secondary | ICD-10-CM | POA: Diagnosis not present

## 2021-11-29 DIAGNOSIS — R197 Diarrhea, unspecified: Secondary | ICD-10-CM | POA: Diagnosis present

## 2021-11-29 DIAGNOSIS — N10 Acute tubulo-interstitial nephritis: Principal | ICD-10-CM | POA: Diagnosis present

## 2021-11-29 DIAGNOSIS — Z79899 Other long term (current) drug therapy: Secondary | ICD-10-CM | POA: Diagnosis not present

## 2021-11-29 DIAGNOSIS — F330 Major depressive disorder, recurrent, mild: Secondary | ICD-10-CM | POA: Diagnosis not present

## 2021-11-29 DIAGNOSIS — Z833 Family history of diabetes mellitus: Secondary | ICD-10-CM | POA: Diagnosis not present

## 2021-11-29 DIAGNOSIS — Z7985 Long-term (current) use of injectable non-insulin antidiabetic drugs: Secondary | ICD-10-CM | POA: Diagnosis not present

## 2021-11-29 DIAGNOSIS — Z9049 Acquired absence of other specified parts of digestive tract: Secondary | ICD-10-CM

## 2021-11-29 DIAGNOSIS — R000 Tachycardia, unspecified: Secondary | ICD-10-CM

## 2021-11-29 DIAGNOSIS — R1030 Lower abdominal pain, unspecified: Secondary | ICD-10-CM | POA: Diagnosis not present

## 2021-11-29 DIAGNOSIS — E785 Hyperlipidemia, unspecified: Secondary | ICD-10-CM | POA: Diagnosis not present

## 2021-11-29 DIAGNOSIS — Z87442 Personal history of urinary calculi: Secondary | ICD-10-CM

## 2021-11-29 DIAGNOSIS — Z8249 Family history of ischemic heart disease and other diseases of the circulatory system: Secondary | ICD-10-CM

## 2021-11-29 DIAGNOSIS — Z8616 Personal history of COVID-19: Secondary | ICD-10-CM | POA: Diagnosis not present

## 2021-11-29 DIAGNOSIS — E119 Type 2 diabetes mellitus without complications: Secondary | ICD-10-CM

## 2021-11-29 DIAGNOSIS — E1165 Type 2 diabetes mellitus with hyperglycemia: Secondary | ICD-10-CM | POA: Diagnosis not present

## 2021-11-29 DIAGNOSIS — Z6837 Body mass index (BMI) 37.0-37.9, adult: Secondary | ICD-10-CM

## 2021-11-29 DIAGNOSIS — Z841 Family history of disorders of kidney and ureter: Secondary | ICD-10-CM

## 2021-11-29 DIAGNOSIS — G43009 Migraine without aura, not intractable, without status migrainosus: Secondary | ICD-10-CM | POA: Diagnosis present

## 2021-11-29 DIAGNOSIS — F339 Major depressive disorder, recurrent, unspecified: Secondary | ICD-10-CM | POA: Diagnosis not present

## 2021-11-29 DIAGNOSIS — A419 Sepsis, unspecified organism: Secondary | ICD-10-CM

## 2021-11-29 DIAGNOSIS — E1129 Type 2 diabetes mellitus with other diabetic kidney complication: Secondary | ICD-10-CM

## 2021-11-29 DIAGNOSIS — N200 Calculus of kidney: Secondary | ICD-10-CM | POA: Diagnosis not present

## 2021-11-29 LAB — LACTIC ACID, PLASMA
Lactic Acid, Venous: 1.1 mmol/L (ref 0.5–1.9)
Lactic Acid, Venous: 1.2 mmol/L (ref 0.5–1.9)

## 2021-11-29 LAB — COMPREHENSIVE METABOLIC PANEL
ALT: 17 U/L (ref 0–44)
AST: 15 U/L (ref 15–41)
Albumin: 4.3 g/dL (ref 3.5–5.0)
Alkaline Phosphatase: 59 U/L (ref 38–126)
Anion gap: 16 — ABNORMAL HIGH (ref 5–15)
BUN: 17 mg/dL (ref 6–20)
CO2: 20 mmol/L — ABNORMAL LOW (ref 22–32)
Calcium: 9.6 mg/dL (ref 8.9–10.3)
Chloride: 101 mmol/L (ref 98–111)
Creatinine, Ser: 0.89 mg/dL (ref 0.44–1.00)
GFR, Estimated: >60 mL/min (ref >60)
Glucose, Bld: 162 mg/dL — ABNORMAL HIGH (ref 70–99)
Potassium: 3.9 mmol/L (ref 3.5–5.1)
Sodium: 137 mmol/L (ref 135–145)
Total Bilirubin: 0.6 mg/dL (ref 0.3–1.2)
Total Protein: 8.3 g/dL — ABNORMAL HIGH (ref 6.5–8.1)

## 2021-11-29 LAB — GLUCOSE, CAPILLARY: Glucose-Capillary: 124 mg/dL — ABNORMAL HIGH (ref 70–99)

## 2021-11-29 LAB — URINALYSIS, ROUTINE W REFLEX MICROSCOPIC
Bilirubin Urine: NEGATIVE
Glucose, UA: NEGATIVE mg/dL
Ketones, ur: NEGATIVE mg/dL
Nitrite: POSITIVE — AB
Protein, ur: 100 mg/dL — AB
Specific Gravity, Urine: 1.025 (ref 1.005–1.030)
pH: 6 (ref 5.0–8.0)

## 2021-11-29 LAB — URINALYSIS, MICROSCOPIC (REFLEX)

## 2021-11-29 LAB — CBC
HCT: 43.9 % (ref 36.0–46.0)
Hemoglobin: 13.9 g/dL (ref 12.0–15.0)
MCH: 25.1 pg — ABNORMAL LOW (ref 26.0–34.0)
MCHC: 31.7 g/dL (ref 30.0–36.0)
MCV: 79.4 fL — ABNORMAL LOW (ref 80.0–100.0)
Platelets: 219 10*3/uL (ref 150–400)
RBC: 5.53 MIL/uL — ABNORMAL HIGH (ref 3.87–5.11)
RDW: 14.7 % (ref 11.5–15.5)
WBC: 19.1 10*3/uL — ABNORMAL HIGH (ref 4.0–10.5)
nRBC: 0 % (ref 0.0–0.2)

## 2021-11-29 LAB — PREGNANCY, URINE: Preg Test, Ur: NEGATIVE

## 2021-11-29 LAB — CBG MONITORING, ED: Glucose-Capillary: 181 mg/dL — ABNORMAL HIGH (ref 70–99)

## 2021-11-29 LAB — LIPASE, BLOOD: Lipase: 19 U/L (ref 11–51)

## 2021-11-29 MED ORDER — PNEUMOCOCCAL 20-VAL CONJ VACC 0.5 ML IM SUSY
0.5000 mL | PREFILLED_SYRINGE | INTRAMUSCULAR | Status: AC
  Administered 2021-12-01: 0.5 mL via INTRAMUSCULAR
  Filled 2021-11-29: qty 0.5

## 2021-11-29 MED ORDER — INSULIN ASPART 100 UNIT/ML IJ SOLN
0.0000 [IU] | Freq: Three times a day (TID) | INTRAMUSCULAR | Status: DC
  Administered 2021-11-30 (×2): 2 [IU] via SUBCUTANEOUS
  Administered 2021-11-30: 3 [IU] via SUBCUTANEOUS
  Administered 2021-12-01 – 2021-12-02 (×2): 2 [IU] via SUBCUTANEOUS

## 2021-11-29 MED ORDER — ALBUTEROL SULFATE (2.5 MG/3ML) 0.083% IN NEBU
3.0000 mL | INHALATION_SOLUTION | Freq: Four times a day (QID) | RESPIRATORY_TRACT | Status: DC | PRN
Start: 2021-11-29 — End: 2021-12-02

## 2021-11-29 MED ORDER — ENOXAPARIN SODIUM 40 MG/0.4ML IJ SOSY
40.0000 mg | PREFILLED_SYRINGE | INTRAMUSCULAR | Status: DC
  Administered 2021-11-29 – 2021-12-01 (×3): 40 mg via SUBCUTANEOUS
  Filled 2021-11-29 (×3): qty 0.4

## 2021-11-29 MED ORDER — SERTRALINE HCL 100 MG PO TABS
100.0000 mg | ORAL_TABLET | Freq: Every day | ORAL | Status: DC
  Administered 2021-11-30 – 2021-12-02 (×3): 100 mg via ORAL
  Filled 2021-11-29 (×3): qty 1

## 2021-11-29 MED ORDER — FAMOTIDINE 20 MG PO TABS
20.0000 mg | ORAL_TABLET | Freq: Every day | ORAL | Status: DC
  Administered 2021-11-29 – 2021-12-02 (×4): 20 mg via ORAL
  Filled 2021-11-29 (×4): qty 1

## 2021-11-29 MED ORDER — ONDANSETRON HCL 4 MG/2ML IJ SOLN
4.0000 mg | Freq: Once | INTRAMUSCULAR | Status: AC
  Administered 2021-11-29: 4 mg via INTRAVENOUS

## 2021-11-29 MED ORDER — ROSUVASTATIN CALCIUM 5 MG PO TABS
5.0000 mg | ORAL_TABLET | Freq: Every day | ORAL | Status: DC
  Administered 2021-11-30 – 2021-12-02 (×3): 5 mg via ORAL
  Filled 2021-11-29 (×3): qty 1

## 2021-11-29 MED ORDER — IOHEXOL 300 MG/ML  SOLN
100.0000 mL | Freq: Once | INTRAMUSCULAR | Status: AC | PRN
Start: 2021-11-29 — End: 2021-11-29
  Administered 2021-11-29: 100 mL via INTRAVENOUS

## 2021-11-29 MED ORDER — LACTATED RINGERS IV SOLN: INTRAVENOUS | Status: AC

## 2021-11-29 MED ORDER — SODIUM CHLORIDE 0.9 % IV BOLUS
1000.0000 mL | Freq: Once | INTRAVENOUS | Status: AC
Start: 2021-11-29 — End: 2021-11-29
  Administered 2021-11-29: 1000 mL via INTRAVENOUS

## 2021-11-29 MED ORDER — MORPHINE SULFATE (PF) 4 MG/ML IV SOLN
4.0000 mg | Freq: Once | INTRAVENOUS | Status: AC
  Administered 2021-11-29: 4 mg via INTRAVENOUS
  Filled 2021-11-29: qty 1

## 2021-11-29 MED ORDER — PROPRANOLOL HCL ER 60 MG PO CP24
120.0000 mg | ORAL_CAPSULE | Freq: Every day | ORAL | Status: DC
  Administered 2021-11-29 – 2021-12-01 (×3): 120 mg via ORAL
  Filled 2021-11-29 (×3): qty 2

## 2021-11-29 MED ORDER — POLYETHYLENE GLYCOL 3350 17 G PO PACK: 17.0000 g | PACK | Freq: Every day | ORAL | Status: DC | PRN

## 2021-11-29 MED ORDER — CIPROFLOXACIN IN D5W 400 MG/200ML IV SOLN
400.0000 mg | Freq: Once | INTRAVENOUS | Status: AC
Start: 2021-11-29 — End: 2021-11-29
  Administered 2021-11-29: 400 mg via INTRAVENOUS
  Filled 2021-11-29: qty 200

## 2021-11-29 MED ORDER — IBUPROFEN 800 MG PO TABS
800.0000 mg | ORAL_TABLET | Freq: Once | ORAL | Status: AC
  Administered 2021-11-29: 800 mg via ORAL
  Filled 2021-11-29: qty 1

## 2021-11-29 MED ORDER — ACETAMINOPHEN 325 MG PO TABS
650.0000 mg | ORAL_TABLET | Freq: Four times a day (QID) | ORAL | Status: DC | PRN
  Administered 2021-11-30 – 2021-12-02 (×5): 650 mg via ORAL
  Filled 2021-11-29 (×5): qty 2

## 2021-11-29 MED ORDER — ACETAMINOPHEN 650 MG RE SUPP
650.0000 mg | Freq: Four times a day (QID) | RECTAL | Status: DC | PRN
  Administered 2021-11-30: 650 mg via RECTAL
  Filled 2021-11-29: qty 1

## 2021-11-29 MED ORDER — BUDESONIDE 0.25 MG/2ML IN SUSP
0.2500 mg | Freq: Two times a day (BID) | RESPIRATORY_TRACT | Status: DC
  Administered 2021-11-30 – 2021-12-02 (×5): 0.25 mg via RESPIRATORY_TRACT
  Filled 2021-11-29 (×5): qty 2

## 2021-11-29 MED ORDER — ACETAMINOPHEN 500 MG PO TABS
1000.0000 mg | ORAL_TABLET | Freq: Once | ORAL | Status: AC
  Administered 2021-11-29: 1000 mg via ORAL

## 2021-11-29 MED ORDER — SODIUM CHLORIDE 0.9 % IV BOLUS
1000.0000 mL | Freq: Once | INTRAVENOUS | Status: AC
  Administered 2021-11-29: 1000 mL via INTRAVENOUS

## 2021-11-29 MED ORDER — SODIUM CHLORIDE 0.9% FLUSH
3.0000 mL | Freq: Two times a day (BID) | INTRAVENOUS | Status: DC
  Administered 2021-11-29 – 2021-12-01 (×4): 3 mL via INTRAVENOUS

## 2021-11-29 MED ORDER — HYDROXYZINE HCL 10 MG PO TABS: 10.0000 mg | ORAL_TABLET | Freq: Three times a day (TID) | ORAL | Status: DC | PRN

## 2021-11-29 MED ORDER — SODIUM CHLORIDE 0.9 % IV SOLN
1.0000 g | INTRAVENOUS | Status: DC
  Administered 2021-11-29 – 2021-12-01 (×3): 1 g via INTRAVENOUS
  Filled 2021-11-29 (×3): qty 10

## 2021-11-29 NOTE — ED Notes (Signed)
Patient aware urine sample needed

## 2021-11-29 NOTE — ED Notes (Signed)
Patient o2 dropped to 88%, 2L [place] applied to maintain spo2 >94%. Provider notified.

## 2021-11-29 NOTE — ED Provider Notes (Signed)
[facility] DEPT Provider Note   CSN: [id number] Arrival date & time: 11/29/21  1025     History  Chief Complaint  Patient presents with   Emesis   Diarrhea    Lauren Cardenas is a 36 y.o. female.  HPI  36 year old female with past medical history of cholecystectomy, kidney stones presents to the emergency department with concern for generalized abdominal pain, nausea/vomiting/diarrhea and fever.  Patient states that she started to feel generally unwell yesterday, overnight is when her symptoms really started.  No blood in the emesis or stools.  She complains of generalized abdominal cramping.  She admits to intermittent dysuria but no hematuria or flank pain.  Denies any chest pain, cough, congestion.     Home Medications Prior to Admission medications   Medication Sig Start Date End Date Taking? Authorizing Provider  acetaminophen (TYLENOL) 500 MG tablet Take 1 tablet (500 mg total) by mouth every 6 (six) hours as needed. Patient taking differently: Take 500 mg by mouth every 6 (six) hours as needed for mild pain. 01/18/21   Ivy Lynn, NP  blood glucose meter kit and supplies Dispense based on patient and insurance preference. Use up to four times daily as directed. (FOR ICD-10 E10.9, E11.9). 10/12/21   Janora Norlander, DO  cetirizine (ZYRTEC) 10 MG chewable tablet Chew 10 mg by mouth daily as needed for allergies.    [provider]  famotidine (PEPCID) 20 MG tablet Take 1 tablet (20 mg total) by mouth daily. For acid reflux 10/07/21   Ronnie Doss M, DO  fluticasone (FLONASE) 50 MCG/ACT nasal spray Place 2 sprays into both nostrils daily. 06/25/21   Baruch Gouty, FNP  fluticasone (FLOVENT HFA) 44 MCG/ACT inhaler Inhale 1 puff into the lungs 2 (two) times daily. 10/07/21   Ronnie Doss M, DO  glucose blood test strip Use as instructed once daily as directed for sugar monitoring E11.9 10/12/21   Janora Norlander, DO   hydrOXYzine (ATARAX) 10 MG tablet Take 10 mg by mouth 3 (three) times daily as needed.    [provider]  Lancets MISC UAD to check BGs once daily as directed E11.9 10/12/21   Ronnie Doss M, DO  metFORMIN (GLUCOPHAGE) 500 MG tablet Take 500 mg by mouth at bedtime.    [provider]  Meth-Hyo-M Bl-Na Phos-Ph Sal (URIBEL) 118 MG CAPS Take 1 capsule by mouth as needed (for urinary pain/discomfort). 12/17/18   [provider]  Multiple Vitamin (MULTI-VITAMIN DAILY PO) Take 1 tablet by mouth at bedtime.     [provider]  nitrofurantoin, macrocrystal-monohydrate, (MACROBID) 100 MG capsule Take 100 mg by mouth as needed (to prevent UTI).    [provider]  OMEGA-3 FATTY ACIDS PO Take 1,000 mg by mouth daily. Per pt she was unsure of the dosage.    [provider]  oxybutynin (DITROPAN) 5 MG tablet Take 5 mg by mouth 4 (four) times daily as needed for bladder spasms.    [provider]  PROAIR HFA 108 [phone] Base) MCG/ACT inhaler Inhale 2 puffs into the lungs every 6 (six) hours as needed for wheezing or shortness of breath. Patient taking differently: Inhale 2 puffs into the lungs every 6 (six) hours as needed for wheezing or shortness of breath. albuterol 12/24/20   Ronnie Doss M, DO  propranolol ER (INDERAL LA) 120 MG 24 hr capsule Take 1 capsule (120 mg total) by mouth at bedtime. 10/07/21   Ronnie Doss  M, DO  Rimegepant Sulfate (NURTEC) 75 MG TBDP Take 1 tablet by mouth daily as needed. Patient taking differently: Take 75 mg by mouth daily as needed (for migraines). 10/05/20   Janora Norlander, DO  rizatriptan (MAXALT-MLT) 10 MG disintegrating tablet Take 1 tablet (10 mg total) by mouth 3 (three) times daily as needed for migraine. 09/06/18   Baruch Gouty, FNP  rosuvastatin (CRESTOR) 5 MG tablet Take 1 tablet (5 mg total) by mouth daily. 10/12/21   Janora Norlander, DO  Semaglutide,0.25 or 0.5MG/DOS, (OZEMPIC, 0.25 OR  0.5 MG/DOSE,) 2 MG/1.5ML SOPN Inject 0.25 mg into the skin every 7 (seven) days for 28 days, THEN 0.5 mg every 7 (seven) days. 10/12/21 01/04/22  Janora Norlander, DO  sertraline (ZOLOFT) 100 MG tablet Take 1 tablet (100 mg total) by mouth daily. 10/07/21   Janora Norlander, DO      Allergies    Amoxicillin and Sudafed [pseudoephedrine hcl]    Review of Systems   Review of Systems  Constitutional:  Positive for chills, fatigue and fever.  Respiratory:  Negative for shortness of breath.   Cardiovascular:  Negative for chest pain.  Gastrointestinal:  Positive for abdominal pain, diarrhea, nausea and vomiting. Negative for blood in stool.  Genitourinary:  Positive for dysuria. Negative for difficulty urinating, flank pain and frequency.  Skin:  Negative for rash.  Neurological:  Negative for headaches.   Physical Exam Updated Vital Signs BP 123/66   Pulse (!) 120   Temp (!) 102.9 F (39.4 C) (Oral)   Resp 14   Ht '5\' 1"'  (1.549 m)   Wt 90.7 kg   SpO2 90%   BMI 37.79 kg/m  Physical Exam Vitals and nursing note reviewed.  Constitutional:      General: She is not in acute distress.    Appearance: Normal appearance.  HENT:     Head: Normocephalic.     Mouth/Throat:     Mouth: Mucous membranes are moist.  Cardiovascular:     Rate and Rhythm: Tachycardia present.  Pulmonary:     Effort: Pulmonary effort is normal. No respiratory distress.  Abdominal:     Palpations: Abdomen is soft.     Tenderness: There is no abdominal tenderness. There is no guarding or rebound.  Skin:    General: Skin is warm.  Neurological:     Mental Status: She is alert and oriented to person, place, and time. Mental status is at baseline.  Psychiatric:        Mood and Affect: Mood normal.    ED Results / Procedures / Treatments   Labs (all labs ordered are listed, but only abnormal results are displayed) Labs Reviewed  COMPREHENSIVE METABOLIC PANEL - Abnormal; Notable for the following  components:      Result Value   CO2 20 (*)    Glucose, Bld 162 (*)    Total Protein 8.3 (*)    Anion gap 16 (*)    All other components within normal limits  CBC - Abnormal; Notable for the following components:   WBC 19.1 (*)    RBC 5.53 (*)    MCV 79.4 (*)    MCH 25.1 (*)    All other components within normal limits  URINALYSIS, ROUTINE W REFLEX MICROSCOPIC - Abnormal; Notable for the following components:   Color, Urine STRAW (*)    Hgb urine dipstick SMALL (*)    Protein, ur 100 (*)    Nitrite POSITIVE (*)  Leukocytes,Ua SMALL (*)    All other components within normal limits  URINALYSIS, MICROSCOPIC (REFLEX) - Abnormal; Notable for the following components:   Bacteria, UA MANY (*)    All other components within normal limits  CBG MONITORING, ED - Abnormal; Notable for the following components:   Glucose-Capillary 181 (*)    All other components within normal limits  LIPASE, BLOOD  PREGNANCY, URINE  LACTIC ACID, PLASMA  LACTIC ACID, PLASMA    EKG None  Radiology No results found.  Procedures .Critical Care Performed by: Lorelle Gibbs, DO Authorized by: Lorelle Gibbs, DO   Critical care provider statement:    Critical care time (minutes):  45   Critical care time was exclusive of:  Separately billable procedures and treating other patients   Critical care was necessary to treat or prevent imminent or life-threatening deterioration of the following conditions:  Sepsis   Critical care was time spent personally by me on the following activities:  Development of treatment plan with patient or surrogate, discussions with consultants, evaluation of patient's response to treatment, examination of patient, ordering and review of laboratory studies, ordering and review of radiographic studies, ordering and performing treatments and interventions, pulse oximetry, re-evaluation of patient's condition and review of old charts   I assumed direction of critical care for  this patient from another provider in my specialty: no     Care discussed with: admitting provider      Medications Ordered in ED Medications  sodium chloride 0.9 % bolus 1,000 mL (has no administration in time range)  sodium chloride 0.9 % bolus 1,000 mL (0 mLs Intravenous Stopped 11/29/21 1206)  ondansetron (ZOFRAN) injection 4 mg (4 mg Intravenous Given 11/29/21 1111)  acetaminophen (TYLENOL) tablet 1,000 mg (1,000 mg Oral Given 11/29/21 1110)  iohexol (OMNIPAQUE) 300 MG/ML solution 100 mL (100 mLs Intravenous Contrast Given 11/29/21 1354)    ED Course/ Medical Decision Making/ A&P                           Medical Decision Making Amount and/or Complexity of Data Reviewed Labs: ordered. Radiology: ordered.  Risk OTC drugs. Prescription drug management.   36 year old female presents emergency department with generalized abdominal pain, nausea/vomiting/diarrhea.  She is febrile and tachycardic on arrival.  Benign abdominal exam.  Blood work shows a leukocytosis, chemistry shows no acute abnormalities.  Urinalysis is positive for UTI, pregnancy test is negative.  Initial lactic is 1.1.  CT of the abdomen pelvis shows findings of pyelonephritis without any other acute abnormalities.  After IV fluids, antipyretic patient continues to be febrile and tachycardic.  She appears unwell and is complaining of ongoing nausea.  Believe the patient will require inpatient treatment of pyelonephritis with IV antibiotics.  Antibiotics ordered per penicillin allergy.  Septic protocol and orders included, repeat lactic is pending.  Patients evaluation and results requires admission for further treatment and care.  Spoke with hospitalist, reviewed patient's ED course and they accept admission.  Patient agrees with admission plan, offers no new complaints and is stable/unchanged at time of admit.  Critical interventions: Treatment per septic protocol, IV hydration, IV antibiotics, admission for  pyelonephritis        Final Clinical Impression(s) / ED Diagnoses Final diagnoses:  None    Rx / DC Orders ED Discharge Orders     None         Lorelle Gibbs, DO 11/29/21 1452

## 2021-11-29 NOTE — ED Notes (Signed)
Report given to [place] at [facility] [address].

## 2021-11-29 NOTE — H&P (Addendum)
History and Physical   MERIDITH ROMICK MGQ:[id number] DOB: 1986-03-02 DOA: 11/29/2021  PCP: Janora Norlander, DO   Patient coming from: Home  Chief Complaint: Abdominal pain, nausea, vomiting, diarrhea  HPI: Lauren Cardenas is a 36 y.o. female with medical history significant of syncope, migraines, diabetes, hyperlipidemia, obesity, depression, anxiety, GERD, asthma presenting with nausea vomiting diarrhea and abdominal pain.  Patient reports ongoing abdominal pain with associated nausea vomiting and diarrhea since yesterday.  She states the symptoms intensified overnight.  Reports the emesis is nonbloody and nonbilious and she has not noticed any blood in her stools.  Reports her abdominal pain is crampy and generalized.  Has not tried anything at home. She also reports chronic dysuria due to interstitial cystitis, but had not noticed any obvious change in dysuria.  Denies current flank pain.  She denies fevers, chills, chest pain, shortness of breath, constipation.  She does have a history of prior episodes of pyelonephritis and renal stones.  ED Course: Vital signs in the ED significant for fever to 102.9, tachycardic in the 110s to 120s, blood pressure in the 267T to 245Y systolic.  Lab work-up included CMP with bicarb 20, gap of 16, glucose 162, protein 8.3.  CBC with leukocytosis to 19.1.  Lactic acid normal x2.  Lipase normal.  Urinalysis with hemoglobin, protein, leukocytes, nitrates, bacteria.  Blood cultures pending.  CT of the abdomen pelvis showed findings consistent with pyelonephritis at the left kidney and a 3 mm nonobstructing renal stone at the right kidney.  Patient received morphine, Tylenol, ibuprofen in the ED.  Also received a dose of ciprofloxacin, 2 L of IV fluids and was started on a rate of 150 cc an hour.  Review of Systems: As per HPI otherwise all other systems reviewed and are negative.  Past Medical History:  Diagnosis Date   Acute pyelonephritis 09/27/2015    Chronic migraine    neurologist-  dr Jaynee Eagles   Depression 05/30/2017   with emotional eating   Diabetes mellitus without complication ([place])    DM type 2   Dysuria    GERD (gastroesophageal reflux disease)    History of acute pyelonephritis    09-27-2015:   11-04-2017  w/ sepsis due to obstruction from kidney stone   History of COVID-19 07/02/2020   mild symptoms x few days all symptoms resolved   History of kidney stones    IC (interstitial cystitis)    Left ureteral stone    OAB (overactive bladder)    Urge urinary incontinence    Vesicoureteral reflux     Past Surgical History:  Procedure Laterality Date   CHOLECYSTECTOMY N/A 10/26/2017   Procedure: LAPAROSCOPIC CHOLECYSTECTOMY WITH INTRAOPERATIVE CHOLANGIOGRAM ERAS PATHWAY;  Surgeon: Erroll Luna, MD;  Location: [place];  Service: General;  Laterality: N/A;   CYSTOSCOPY W/ URETERAL STENT PLACEMENT Right 09/27/2015   Procedure: CYSTOSCOPY WITH RETROGRADE PYELOGRAM/URETERAL STENT PLACEMENT;  Surgeon: Nickie Retort, MD;  Location: [facility];  Service: Urology;  Laterality: Right;   CYSTOSCOPY WITH RETROGRADE PYELOGRAM, URETEROSCOPY AND STENT PLACEMENT Left 09/09/2014   Procedure: CYSTOSCOPY WITH LEFT RETROGRADE PYELOGRAM, URETEROSCOPY AND STONE EXTRACTION  DOUBLE J STENT PLACEMENT;  Surgeon: Festus Aloe, MD;  Location: [facility];  Service: Urology;  Laterality: Left;   CYSTOSCOPY WITH STENT PLACEMENT Left 11/04/2017   Procedure: CYSTOSCOPY WITH RETROGRADE AND LEFT STENT PLACEMENT;  Surgeon: Lucas Mallow, MD;  Location: [facility];  Service: Urology;  Laterality: Left;   CYSTOSCOPY/URETEROSCOPY/HOLMIUM LASER/STENT PLACEMENT Right 10/16/2015  Procedure: RIGHT URETEROSCOPY/HOLMIUM LASER/STENT PLACEMENT;  Surgeon: Festus Aloe, MD;  Location: [facility];  Service: Urology;  Laterality: Right;   CYSTOSCOPY/URETEROSCOPY/HOLMIUM LASER/STENT PLACEMENT Left 11/21/2017   Procedure: CYSTOSCOPY/URETEROSCOPY/HOLMIUM LASER/STENT  PLACEMENT;  Surgeon: Festus Aloe, MD;  Location: [facility];  Service: Urology;  Laterality: Left;  ONLY NEEDS 60 MIN   CYSTOSCOPY/URETEROSCOPY/HOLMIUM LASER/STENT PLACEMENT Left 04/20/2021   Procedure: CYSTOSCOPY/RETROGRADE/URETEROSCOPY/BASKET STONE EXTRACTION/STENT PLACEMENT;  Surgeon: Festus Aloe, MD;  Location: [facility];  Service: Urology;  Laterality: Left;   HOLMIUM LASER APPLICATION Left 3/42/8768   Procedure: HOLMIUM LASER APPLICATION;  Surgeon: Festus Aloe, MD;  Location: [facility];  Service: Urology;  Laterality: Left;   HOLMIUM LASER APPLICATION Right 07/11/7260   Procedure: HOLMIUM LASER APPLICATION;  Surgeon: Festus Aloe, MD;  Location: [facility];  Service: Urology;  Laterality: Right;   STONE EXTRACTION WITH BASKET Right 10/16/2015   Procedure: STONE EXTRACTION WITH BASKET;  Surgeon: Festus Aloe, MD;  [facility];  Service: Urology;  Laterality: Right;   WISDOM TOOTH EXTRACTION  03/2014    Social History  reports that she has never smoked. She has never used smokeless tobacco. She reports that she does not drink alcohol and does not use drugs.  Allergies  Allergen Reactions   Amoxicillin Hives    ++ got ceftriaxone in the past++ Has patient had a PCN reaction causing immediate rash, facial/tongue/throat swelling, SOB or lightheadedness with hypotension: Yes Has patient had a PCN reaction causing severe rash involving mucus membranes or skin necrosis: No Has patient had a PCN reaction that required hospitalization: No Has patient had a PCN reaction occurring within the last 10 years: Yes If all of the above answers are "NO", then may proceed with Cephalosporin use.  ++ got ceftriaxone in the past++ Has patient had a PCN reaction causing immediate rash, facial/tongue/throat swelling, SOB or lightheadedness with hypotension: Yes Has patient had a PCN reaction causing severe rash  involving mucus membranes or skin necrosis: No Has patient had a PCN reaction that required hospitalization: No Has patient had a PCN reaction occurring within the last 10 years: Yes If all of the above answers are "NO", then may proceed with Cephalosporin use.   Sudafed [Pseudoephedrine Hcl] Shortness Of Breath    Family History  Problem Relation Age of Onset   Hypertension Mother    Obesity Mother    Diabetes Father    Kidney disease Father    Sudden death Father   Reviewed on admission  Prior to Admission medications   Medication Sig Start Date End Date Taking? Authorizing Provider  acetaminophen (TYLENOL) 500 MG tablet Take 1 tablet (500 mg total) by mouth every 6 (six) hours as needed. Patient taking differently: Take 500 mg by mouth every 6 (six) hours as needed for mild pain. 01/18/21   Ivy Lynn, NP  blood glucose meter kit and supplies Dispense based on patient and insurance preference. Use up to four times daily as directed. (FOR ICD-10 E10.9, E11.9). 10/12/21   Janora Norlander, DO  cetirizine (ZYRTEC) 10 MG chewable tablet Chew 10 mg by mouth daily as needed for allergies.    [provider]  famotidine (PEPCID) 20 MG tablet Take 1 tablet (20 mg total) by mouth daily. For acid reflux 10/07/21   Ronnie Doss M, DO  fluticasone (FLONASE) 50 MCG/ACT nasal spray Place 2 sprays into both nostrils daily. 06/25/21   Baruch Gouty, FNP  fluticasone (FLOVENT HFA) 44  MCG/ACT inhaler Inhale 1 puff into the lungs 2 (two) times daily. 10/07/21   Ronnie Doss M, DO  glucose blood test strip Use as instructed once daily as directed for sugar monitoring E11.9 10/12/21   Janora Norlander, DO  hydrOXYzine (ATARAX) 10 MG tablet Take 10 mg by mouth 3 (three) times daily as needed.    [provider]  Lancets MISC UAD to check BGs once daily as directed E11.9 10/12/21   Ronnie Doss M, DO  metFORMIN (GLUCOPHAGE) 500 MG tablet Take 500 mg by mouth at  bedtime.    [provider]  Meth-Hyo-M Bl-Na Phos-Ph Sal (URIBEL) 118 MG CAPS Take 1 capsule by mouth as needed (for urinary pain/discomfort). 12/17/18   [provider]  Multiple Vitamin (MULTI-VITAMIN DAILY PO) Take 1 tablet by mouth at bedtime.     [provider]  nitrofurantoin, macrocrystal-monohydrate, (MACROBID) 100 MG capsule Take 100 mg by mouth as needed (to prevent UTI).    [provider]  OMEGA-3 FATTY ACIDS PO Take 1,000 mg by mouth daily. Per pt she was unsure of the dosage.    [provider]  oxybutynin (DITROPAN) 5 MG tablet Take 5 mg by mouth 4 (four) times daily as needed for bladder spasms.    [provider]  PROAIR HFA 108 [phone] Base) MCG/ACT inhaler Inhale 2 puffs into the lungs every 6 (six) hours as needed for wheezing or shortness of breath. Patient taking differently: Inhale 2 puffs into the lungs every 6 (six) hours as needed for wheezing or shortness of breath. albuterol 12/24/20   Ronnie Doss M, DO  propranolol ER (INDERAL LA) 120 MG 24 hr capsule Take 1 capsule (120 mg total) by mouth at bedtime. 10/07/21   Janora Norlander, DO  Rimegepant Sulfate (NURTEC) 75 MG TBDP Take 1 tablet by mouth daily as needed. Patient taking differently: Take 75 mg by mouth daily as needed (for migraines). 10/05/20   Janora Norlander, DO  rizatriptan (MAXALT-MLT) 10 MG disintegrating tablet Take 1 tablet (10 mg total) by mouth 3 (three) times daily as needed for migraine. 09/06/18   Baruch Gouty, FNP  rosuvastatin (CRESTOR) 5 MG tablet Take 1 tablet (5 mg total) by mouth daily. 10/12/21   Janora Norlander, DO  Semaglutide,0.25 or 0.5MG/DOS, (OZEMPIC, 0.25 OR 0.5 MG/DOSE,) 2 MG/1.5ML SOPN Inject 0.25 mg into the skin every 7 (seven) days for 28 days, THEN 0.5 mg every 7 (seven) days. 10/12/21 01/04/22  Janora Norlander, DO  sertraline (ZOLOFT) 100 MG tablet Take 1 tablet (100 mg total) by mouth daily. 10/07/21   Janora Norlander, DO    Physical Exam: Vitals:   11/29/21 1458 11/29/21 1500 11/29/21 1514 11/29/21 1741  BP: (!) 136/97 (!) 137/91  109/70  Pulse: (!) 124 (!) 111  (!) 117  Resp:  11  18  Temp:   (!) 101.1 F (38.4 C) (!) 102.9 F (39.4 C)  TempSrc:   Oral Oral  SpO2: 99% 97%  92%  Weight:      Height:        Physical Exam Constitutional:      General: She is not in acute distress.    Appearance: Normal appearance. She is obese.  HENT:     Head: Normocephalic and atraumatic.     Mouth/Throat:     Mouth: Mucous membranes are moist.     Pharynx: Oropharynx is clear.  Eyes:     Extraocular Movements: Extraocular movements  intact.     Pupils: Pupils are equal, round, and reactive to light.  Cardiovascular:     Rate and Rhythm: Normal rate and regular rhythm.     Pulses: Normal pulses.     Heart sounds: Normal heart sounds.  Pulmonary:     Effort: Pulmonary effort is normal. No respiratory distress.     Breath sounds: Normal breath sounds.  Abdominal:     General: Bowel sounds are normal. There is no distension.     Palpations: Abdomen is soft.     Tenderness: There is abdominal tenderness.  Musculoskeletal:        General: No swelling or deformity.  Skin:    General: Skin is warm and dry.  Neurological:     General: No focal deficit present.     Mental Status: Mental status is at baseline.   Labs on Admission: I have personally reviewed following labs and imaging studies  CBC: Recent Labs  Lab 11/29/21 1040  WBC 19.1*  HGB 13.9  HCT 43.9  MCV 79.4*  PLT 062    Basic Metabolic Panel: Recent Labs  Lab 11/29/21 1040  NA 137  K 3.9  CL 101  CO2 20*  GLUCOSE 162*  BUN 17  CREATININE 0.89  CALCIUM 9.6    GFR: Estimated Creatinine Clearance: 88.8 mL/min (by C-G formula based on SCr of 0.89 mg/dL).  Liver Function Tests: Recent Labs  Lab 11/29/21 1040  AST 15  ALT 17  ALKPHOS 59  BILITOT 0.6  PROT 8.3*  ALBUMIN 4.3    Urine analysis:     Component Value Date/Time   COLORURINE STRAW (A) 11/29/2021 1216   APPEARANCEUR CLEAR 11/29/2021 1216   APPEARANCEUR Hazy (A) 10/06/2017 0900   LABSPEC 1.025 11/29/2021 1216   PHURINE 6.0 11/29/2021 1216   GLUCOSEU NEGATIVE 11/29/2021 1216   HGBUR SMALL (A) 11/29/2021 1216   BILIRUBINUR NEGATIVE 11/29/2021 1216   BILIRUBINUR Positive (A) 10/06/2017 0900   KETONESUR NEGATIVE 11/29/2021 1216   PROTEINUR 100 (A) 11/29/2021 1216   UROBILINOGEN negative 06/12/2014 1536   UROBILINOGEN 1.0 03/20/2012 2124   NITRITE POSITIVE (A) 11/29/2021 1216   LEUKOCYTESUR SMALL (A) 11/29/2021 1216    Radiological Exams on Admission: CT Abdomen Pelvis W Contrast  Result Date: 11/29/2021 CLINICAL DATA:  Lower abdominal pain with UTI symptoms. History of kidney stones. EXAM: CT ABDOMEN AND PELVIS WITH CONTRAST TECHNIQUE: Multidetector CT imaging of the abdomen and pelvis was performed using the standard protocol following bolus administration of intravenous contrast. RADIATION DOSE REDUCTION: This exam was performed according to the departmental dose-optimization program which includes automated exposure control, adjustment of the mA and/or kV according to patient size and/or use of iterative reconstruction technique. CONTRAST:  147m OMNIPAQUE IOHEXOL 300 MG/ML  SOLN COMPARISON:  CT stone study 04/25/2021 FINDINGS: Lower chest: Unremarkable. Hepatobiliary: No suspicious focal abnormality within the liver parenchyma. Gallbladder surgically absent. No intrahepatic or extrahepatic biliary dilation. Pancreas: No focal mass lesion. No dilatation of the main duct. No intraparenchymal cyst. No peripancreatic edema. Spleen: No splenomegaly. No focal mass lesion. Adrenals/Urinary Tract: No adrenal nodule or mass. Cortical scarring noted in both kidneys. 3 mm nonobstructing stone identified interpolar right kidney. 2.4 cm region of hypoperfusion is identified in the anterior interpolar left kidney (sagittal 98/6) with a second  18 mm region of parenchymal hypoperfusion towards the upper pole the left kidney sagittal 103/6). Imaging appearance is suspicious for pyelonephritis. Patient noted at a 2 mm nonobstructing stone in the upper interpolar left  kidney. No evidence for hydroureter. The urinary bladder appears normal for the degree of distention. Stomach/Bowel: Stomach is unremarkable. No gastric wall thickening. No evidence of outlet obstruction. Duodenum is normally positioned as is the ligament of Treitz. No small bowel wall thickening. No small bowel dilatation. The terminal ileum is normal. The appendix is not well visualized, but there is no edema or inflammation in the region of the cecum. No gross colonic mass. No colonic wall thickening. Vascular/Lymphatic: No abdominal aortic aneurysm. There is no gastrohepatic or hepatoduodenal ligament lymphadenopathy. No retroperitoneal or mesenteric lymphadenopathy. No pelvic sidewall lymphadenopathy. Reproductive: The uterus is unremarkable.  There is no adnexal mass. Other: No intraperitoneal free fluid. Musculoskeletal: No worrisome lytic or sclerotic osseous abnormality. IMPRESSION: 1. Two areas of parenchymal hypoperfusion/segmental edema in the left kidney, compatible with pyelonephritis. 2. 3 mm nonobstructing stone identified interpolar right kidney. Electronically Signed   By: [name] M.D.   On: 11/29/2021 14:11    EKG: Not performed in the emergency department.  Assessment/Plan Principal Problem:   Pyelonephritis Active Problems:   Migraine without aura and without status migrainosus, not intractable   Hyperlipidemia associated with type 2 diabetes mellitus (HCC)   Morbid obesity (HCC)   Generalized anxiety disorder   Depression, major, recurrent ([place])   GERD without esophagitis   Diabetes ([place])   Pyelonephritis > Patient presenting with abdominal pain, nausea, vomiting, diarrhea and dysuria.  Found to have leukocytosis to 19.1, urinalysis showing  hemoglobin, protein, leukocytes, nitrates, bacteria, and CT of the abdomen pelvis with findings consistent of left-sided pyelonephritis. > Patient received ciprofloxacin in the ED.  As well as Tylenol and Advil. > Patient noted to be tachycardic likely associated with fever in the 110s to 120s in the ED. - Transfer patient to telemetry and start cardiac monitoring considering tachycardia - Transition patient from ciprofloxacin to ceftriaxone, has tolerated this in the past per chart review - Add on urine culture - Trend fever curve and WBC - Supportive care, continue IV fluids  Hyperlipidemia - Continue home rosuvastatin  Diabetes - SSI  Anxiety Depression - Continue home hydroxyzine and sertraline  GERD - Continue home Pepcid  Asthma - Replace home Flovent with formulary Pulmicort - As needed albuterol  Migraines - Is prescribed as needed Nurtec and rizatriptan but states she has not taken these in a long time - Continue home propranolol  Obesity - Noted  DVT prophylaxis: Lovenox Code Status:   Full Family Communication:  None on admission.  She states her husband was with her earlier and is up-to-date. Disposition Plan:   Patient is from:  Home  Anticipated DC to:  Home  Anticipated DC date:  2 to 3 days  Anticipated DC barriers: None  Consults called:  None Admission status:  Inpatient, telemetry  Severity of Illness: The appropriate patient status for this patient is INPATIENT. Inpatient status is judged to be reasonable and necessary in order to provide the required intensity of service to ensure the patient's safety. The patient's presenting symptoms, physical exam findings, and initial radiographic and laboratory data in the context of their chronic comorbidities is felt to place them at high risk for further clinical deterioration. Furthermore, it is not anticipated that the patient will be medically stable for discharge from the hospital within 2 midnights of  admission.   * I certify that at the point of admission it is my clinical judgment that the patient will require inpatient hospital care spanning beyond 2 midnights from the point  of admission due to high intensity of service, high risk for further deterioration and high frequency of surveillance required.Marcelyn Bruins MD [facility]  How to contact the [facility] Attending or Consulting provider [place] or covering provider during after hours [place], for this patient?   Check the care team in [facility] and look for a) attending/consulting [facility] provider listed and b) the [facility] team listed Log into [email or web address] and use [place]'s universal password to access. If you do not have the password, please contact the hospital operator. Locate the [facility] provider you are looking for under [facility] and page to a number that you can be directly reached. If you still have difficulty reaching the provider, please page the [facility] (Director on Call) for the Hospitalists listed on amion for assistance.  11/29/2021, 6:29 PM

## 2021-11-29 NOTE — ED Notes (Signed)
Report given to tia from [facility]

## 2021-11-29 NOTE — ED Triage Notes (Signed)
Pt reports nausea yesterday. States last night she began having n/v/d. Also reports her urine is white and milky. Pt taking ozempic. Pt took tylenol around 330 this morning. Pt endorses full body aches, worse in the legs.

## 2021-11-29 NOTE — Sepsis Progress Note (Signed)
eLink monitoring code sepsis.  

## 2021-11-30 ENCOUNTER — Encounter (HOSPITAL_COMMUNITY): Payer: Self-pay | Admitting: Internal Medicine

## 2021-11-30 DIAGNOSIS — E785 Hyperlipidemia, unspecified: Secondary | ICD-10-CM | POA: Diagnosis not present

## 2021-11-30 DIAGNOSIS — N12 Tubulo-interstitial nephritis, not specified as acute or chronic: Secondary | ICD-10-CM | POA: Diagnosis not present

## 2021-11-30 DIAGNOSIS — G43009 Migraine without aura, not intractable, without status migrainosus: Secondary | ICD-10-CM | POA: Diagnosis not present

## 2021-11-30 DIAGNOSIS — E119 Type 2 diabetes mellitus without complications: Secondary | ICD-10-CM | POA: Diagnosis not present

## 2021-11-30 DIAGNOSIS — A409 Streptococcal sepsis, unspecified: Secondary | ICD-10-CM

## 2021-11-30 DIAGNOSIS — E1169 Type 2 diabetes mellitus with other specified complication: Secondary | ICD-10-CM | POA: Diagnosis not present

## 2021-11-30 LAB — CBC
HCT: 41.4 % (ref 36.0–46.0)
Hemoglobin: 12.4 g/dL (ref 12.0–15.0)
MCH: 25.1 pg — ABNORMAL LOW (ref 26.0–34.0)
MCHC: 30 g/dL (ref 30.0–36.0)
MCV: 83.8 fL (ref 80.0–100.0)
Platelets: 183 10*3/uL (ref 150–400)
RBC: 4.94 MIL/uL (ref 3.87–5.11)
RDW: 15.2 % (ref 11.5–15.5)
WBC: 20.2 10*3/uL — ABNORMAL HIGH (ref 4.0–10.5)
nRBC: 0 % (ref 0.0–0.2)

## 2021-11-30 LAB — COMPREHENSIVE METABOLIC PANEL
ALT: 65 U/L — ABNORMAL HIGH (ref 0–44)
AST: 70 U/L — ABNORMAL HIGH (ref 15–41)
Albumin: 3 g/dL — ABNORMAL LOW (ref 3.5–5.0)
Alkaline Phosphatase: 78 U/L (ref 38–126)
Anion gap: 8 (ref 5–15)
BUN: 9 mg/dL (ref 6–20)
CO2: 24 mmol/L (ref 22–32)
Calcium: 7.9 mg/dL — ABNORMAL LOW (ref 8.9–10.3)
Chloride: 107 mmol/L (ref 98–111)
Creatinine, Ser: 0.75 mg/dL (ref 0.44–1.00)
GFR, Estimated: >60 mL/min (ref >60)
Glucose, Bld: 121 mg/dL — ABNORMAL HIGH (ref 70–99)
Potassium: 3.4 mmol/L — ABNORMAL LOW (ref 3.5–5.1)
Sodium: 139 mmol/L (ref 135–145)
Total Bilirubin: 0.6 mg/dL (ref 0.3–1.2)
Total Protein: 6.6 g/dL (ref 6.5–8.1)

## 2021-11-30 LAB — GLUCOSE, CAPILLARY
Glucose-Capillary: 121 mg/dL — ABNORMAL HIGH (ref 70–99)
Glucose-Capillary: 145 mg/dL — ABNORMAL HIGH (ref 70–99)
Glucose-Capillary: 155 mg/dL — ABNORMAL HIGH (ref 70–99)
Glucose-Capillary: 111 mg/dL — ABNORMAL HIGH (ref 70–99)

## 2021-11-30 MED ORDER — OXYBUTYNIN CHLORIDE 5 MG PO TABS
5.0000 mg | ORAL_TABLET | Freq: Four times a day (QID) | ORAL | Status: DC | PRN
Start: 2021-11-30 — End: 2021-12-02

## 2021-11-30 MED ORDER — PROCHLORPERAZINE EDISYLATE 10 MG/2ML IJ SOLN: 10.0000 mg | Freq: Four times a day (QID) | INTRAMUSCULAR | Status: DC | PRN

## 2021-11-30 MED ORDER — PROMETHAZINE HCL 25 MG PO TABS
25.0000 mg | ORAL_TABLET | Freq: Four times a day (QID) | ORAL | Status: DC | PRN
  Administered 2021-11-30: 25 mg via ORAL
  Filled 2021-11-30: qty 1

## 2021-11-30 MED ORDER — PROMETHAZINE HCL 25 MG RE SUPP: 25.0000 mg | Freq: Four times a day (QID) | RECTAL | Status: DC | PRN

## 2021-11-30 MED ORDER — ONDANSETRON HCL 4 MG/2ML IJ SOLN
4.0000 mg | Freq: Once | INTRAMUSCULAR | Status: AC
  Administered 2021-11-30: 4 mg via INTRAVENOUS
  Filled 2021-11-30: qty 2

## 2021-11-30 MED ORDER — ONDANSETRON HCL 4 MG/2ML IJ SOLN
4.0000 mg | Freq: Three times a day (TID) | INTRAMUSCULAR | Status: DC | PRN
  Administered 2021-11-30: 4 mg via INTRAVENOUS
  Filled 2021-11-30: qty 2

## 2021-11-30 MED ORDER — RIZATRIPTAN BENZOATE 10 MG PO TBDP: 10.0000 mg | ORAL_TABLET | Freq: Three times a day (TID) | ORAL | Status: DC | PRN

## 2021-11-30 MED ORDER — SUMATRIPTAN SUCCINATE 50 MG PO TABS
100.0000 mg | ORAL_TABLET | ORAL | Status: DC | PRN
  Administered 2021-11-30: 100 mg via ORAL
  Filled 2021-11-30 (×2): qty 2

## 2021-11-30 NOTE — Progress Notes (Signed)
[facility] PROGRESS NOTE    Progress Note  Lauren Cardenas  JKD:[id number] DOB: 1986-01-18 DOA: 11/29/2021 PCP: Raliegh Ip, DO     Brief Narrative:   Lauren Cardenas is an 36 y.o. female past medical history significant for syncope obesity and asthma comes in for nausea, vomiting, diarrhea and abdominal pain burning when she urinates temperature of 102 tachycardic blood pressure in the 100s to 130s anion gap of 16, bicarb of 20 white count of 19 UA was consistent with infection.  CT scan of the abdomen and pelvis showed left-sided pyelonephritis  Assessment/Plan:   Sepsis due to acute Pyelonephritis Tmax of 103.4 continues to have a leukocytosis. She was fluid resuscitated started empirically on IV Rocephin. blood cultures are pending.  Go ahead and send urine cultures. Relates her nausea and vomiting are improved tolerating her diet.  Hyperlipidemia: Continue statins.  Diabetes mellitus type 2 with hyperglycemia: Hold metformin and semaglutide. Check an A1c which is pending. Continue sliding scale insulin blood glucose fairly controlled.  Anxiety/depression: Continue sertraline and hydroxyzine.  Asthma: Stable continue inhalers.  Migraine without aura and without status migrainosus, not intractable Continue current medication.  And propanolol scheduled.  Obesity: Noted.  DVT prophylaxis: lovenox Family Communication:none Status is: Inpatient Remains inpatient appropriate because: Sepsis due to severe pyelonephritis    Code Status:     Code Status Orders  (From admission, onward)           Start     Ordered   11/29/21 1827  Full code  Continuous        11/29/21 1827           Code Status History     Date Active Date Inactive Code Status Order ID Comments User Context   11/04/2017 1643 11/09/2017 1555 Full Code [id number]  Glade Lloyd, MD ED   11/01/2017 0049 11/02/2017 1426 Full Code [id number]  Manus Rudd, MD ED          IV Access:   Peripheral IV   Procedures and diagnostic studies:   CT Abdomen Pelvis W Contrast  Result Date: 11/29/2021 CLINICAL DATA:  Lower abdominal pain with UTI symptoms. History of kidney stones. EXAM: CT ABDOMEN AND PELVIS WITH CONTRAST TECHNIQUE: Multidetector CT imaging of the abdomen and pelvis was performed using the standard protocol following bolus administration of intravenous contrast. RADIATION DOSE REDUCTION: This exam was performed according to the departmental dose-optimization program which includes automated exposure control, adjustment of the mA and/or kV according to patient size and/or use of iterative reconstruction technique. CONTRAST:  OMNIPAQUE IOHEXOL 300 MG/ML  SOLN COMPARISON:  CT stone study 04/25/2021 FINDINGS: Lower chest: Unremarkable. Hepatobiliary: No suspicious focal abnormality within the liver parenchyma. Gallbladder surgically absent. No intrahepatic or extrahepatic biliary dilation. Pancreas: No focal mass lesion. No dilatation of the main duct. No intraparenchymal cyst. No peripancreatic edema. Spleen: No splenomegaly. No focal mass lesion. Adrenals/Urinary Tract: No adrenal nodule or mass. Cortical scarring noted in both kidneys. 3 mm nonobstructing stone identified interpolar right kidney. 2.4 cm region of hypoperfusion is identified in the anterior interpolar left kidney (sagittal 98/6) with a second 18 mm region of parenchymal hypoperfusion towards the upper pole the left kidney sagittal 103/6). Imaging appearance is suspicious for pyelonephritis. Patient noted at a 2 mm nonobstructing stone in the upper interpolar left kidney. No evidence for hydroureter. The urinary bladder appears normal for the degree of distention. Stomach/Bowel: Stomach is unremarkable. No gastric wall thickening. No evidence of outlet obstruction.  Duodenum is normally positioned as is the ligament of Treitz. No small bowel wall thickening. No small bowel dilatation. The  terminal ileum is normal. The appendix is not well visualized, but there is no edema or inflammation in the region of the cecum. No gross colonic mass. No colonic wall thickening. Vascular/Lymphatic: No abdominal aortic aneurysm. There is no gastrohepatic or hepatoduodenal ligament lymphadenopathy. No retroperitoneal or mesenteric lymphadenopathy. No pelvic sidewall lymphadenopathy. Reproductive: The uterus is unremarkable.  There is no adnexal mass. Other: No intraperitoneal free fluid. Musculoskeletal: No worrisome lytic or sclerotic osseous abnormality. IMPRESSION: 1. Two areas of parenchymal hypoperfusion/segmental edema in the left kidney, compatible with pyelonephritis. 2. 3 mm nonobstructing stone identified interpolar right kidney. Electronically Signed   By: Kennith Center M.D.   On: 11/29/2021 14:11     Medical Consultants:   None.   Subjective:    Lauren Cardenas nausea and vomiting are improved we will like to try diet today.  Objective:    Vitals:   11/30/21 0015 11/30/21 0116 11/30/21 0216 11/30/21 0310  BP: 123/70 127/72 120/71 110/70  Pulse: (!) 123 (!) 115 (!) 106 (!) 102  Resp: 19 18 19 17   Temp: (!) 103.4 F (39.7 C) (!) 103.2 F (39.6 C) 99.8 F (37.7 C) (!) 101.7 F (38.7 C)  TempSrc: Oral Oral Oral Oral  SpO2: 90% 95% 94% 91%  Weight:      Height:       SpO2: 91 %   Intake/Output Summary (Last 24 hours) at 11/30/2021 0639 Last data filed at 11/29/2021 2200 Gross per 24 hour  Intake 2400.55 ml  Output --  Net 2400.55 ml   Filed Weights   11/29/21 1028  Weight: 90.7 kg    Exam: General exam: In no acute distress. Respiratory system: Good air movement and clear to auscultation. Cardiovascular system: S1 & S2 heard, RRR. No JVD. Gastrointestinal system: Abdomen is nondistended, soft and nontender.  Extremities: No pedal edema. Skin: No rashes, lesions or ulcers Psychiatry: Judgement and insight appear normal. Mood & affect appropriate.    Data  Reviewed:    Labs: Basic Metabolic Panel: Recent Labs  Lab 11/29/21 1040  NA 137  K 3.9  CL 101  CO2 20*  GLUCOSE 162*  BUN 17  CREATININE 0.89  CALCIUM 9.6   GFR Estimated Creatinine Clearance: 88.8 mL/min (by C-G formula based on SCr of 0.89 mg/dL). Liver Function Tests: Recent Labs  Lab 11/29/21 1040  AST 15  ALT 17  ALKPHOS 59  BILITOT 0.6  PROT 8.3*  ALBUMIN 4.3   Recent Labs  Lab 11/29/21 1040  LIPASE 19   No results for input(s): AMMONIA in the last 168 hours. Coagulation profile No results for input(s): INR, PROTIME in the last 168 hours. COVID-19 Labs  No results for input(s): DDIMER, FERRITIN, LDH, CRP in the last 72 hours.  Lab Results  Component Value Date   SARSCOV2NAA Detected (A) 07/02/2020   SARSCOV2NAA Not Detected 06/12/2020    CBC: Recent Labs  Lab 11/29/21 1040  WBC 19.1*  HGB 13.9  HCT 43.9  MCV 79.4*  PLT 219   Cardiac Enzymes: No results for input(s): CKTOTAL, CKMB, CKMBINDEX, TROPONINI in the last 168 hours. BNP (last 3 results) No results for input(s): PROBNP in the last 8760 hours. CBG: Recent Labs  Lab 11/29/21 1032 11/29/21 2207  GLUCAP 181* 124*   D-Dimer: No results for input(s): DDIMER in the last 72 hours. Hgb A1c: No results for input(s):  HGBA1C in the last 72 hours. Lipid Profile: No results for input(s): CHOL, HDL, LDLCALC, TRIG, CHOLHDL, LDLDIRECT in the last 72 hours. Thyroid function studies: No results for input(s): TSH, T4TOTAL, T3FREE, THYROIDAB in the last 72 hours.  Invalid input(s): FREET3 Anemia work up: No results for input(s): VITAMINB12, FOLATE, FERRITIN, TIBC, IRON, RETICCTPCT in the last 72 hours. Sepsis Labs: Recent Labs  Lab 11/29/21 1040 11/29/21 1250 11/29/21 1606  WBC 19.1*  --   --   LATICACIDVEN  --  1.1 1.2   Microbiology No results found for this or any previous visit (from the past 240 hour(s)).   Medications:    budesonide  0.25 mg Nebulization BID   enoxaparin  (LOVENOX) injection  40 mg Subcutaneous Q24H   famotidine  20 mg Oral Daily   insulin aspart  0-15 Units Subcutaneous TID WC   [START ON 12/01/2021] pneumococcal 20-valent conjugate vaccine  0.5 mL Intramuscular Tomorrow-1000   propranolol ER  120 mg Oral QHS   rosuvastatin  5 mg Oral Daily   sertraline  100 mg Oral Daily   sodium chloride flush  3 mL Intravenous Q12H   Continuous Infusions:  cefTRIAXone (ROCEPHIN)  IV 1 g (11/29/21 2117)   lactated ringers 150 mL/hr at 11/29/21 1846      LOS: 1 day   Marinda ElkAbraham Feliz Ortiz  [facility]  11/30/2021, 6:39 AM

## 2021-11-30 NOTE — Progress Notes (Signed)
   11/30/21 0015  Assess: MEWS Score  Temp (!) 103.4 F (39.7 C)  BP 123/70  MAP (mmHg) 85  Pulse Rate (!) 123  Resp 19  SpO2 90 %  O2 Device Room Air  Assess: MEWS Score  MEWS Temp 2  MEWS Systolic 0  MEWS Pulse 2  MEWS RR 0  MEWS LOC 0  MEWS Score 4  MEWS Score Color Red  Assess: if the MEWS score is Yellow or Red  Were vital signs taken at a resting state? Yes  Focused Assessment No change from prior assessment  Does the patient meet 2 or more of the SIRS criteria? Yes  Does the patient have a confirmed or suspected source of infection? Yes  Provider and Rapid Response Notified? Yes  MEWS guidelines implemented *See Row Information* Yes  Treat  MEWS Interventions Administered prn meds/treatments  Pain Scale 0-10  Pain Score 0  Take Vital Signs  Increase Vital Sign Frequency  Red: Q 1hr X 4 then Q 4hr X 4, if remains red, continue Q 4hrs  Escalate  MEWS: Escalate Red: discuss with charge nurse/RN and provider, consider discussing with RRT  Notify: Charge Nurse/RN  Name of Charge Nurse/RN Notified Meredith, RN  Date Charge Nurse/RN Notified 11/30/21  Time Charge Nurse/RN Notified 0017  Notify: Provider  Provider Name/Title Johann Capers, NP  Date Provider Notified 11/30/21  Time Provider Notified 0017  Method of Notification Page  Notification Reason Other (Comment) (Red Mews, Fever, Nausea)  Provider response See new orders  Date of Provider Response 11/30/21  Assess: SIRS CRITERIA  SIRS Temperature  1  SIRS Pulse 1  SIRS Respirations  0  SIRS WBC 0  SIRS Score Sum  2   Patient is alert and oriented x4. Tylenol suppository administered. Zofran IV given for nausea. Ice packs behind neck, bilateral heels.

## 2021-11-30 NOTE — Progress Notes (Signed)
   11/30/21 1636  Assess: MEWS Score  Temp (!) 103.6 F (39.8 C)  BP 131/84  MAP (mmHg) 96  Pulse Rate (!) 102  SpO2 (!) 84 % (RN notified)  O2 Device Room Air  Assess: MEWS Score  MEWS Temp 2  MEWS Systolic 0  MEWS Pulse 1  MEWS RR 0  MEWS LOC 0  MEWS Score 3  MEWS Score Color Yellow  Assess: if the MEWS score is Yellow or Red  Were vital signs taken at a resting state? Yes  Focused Assessment No change from prior assessment  Does the patient meet 2 or more of the SIRS criteria? Yes  Does the patient have a confirmed or suspected source of infection? Yes  Provider and Rapid Response Notified? Yes  MEWS guidelines implemented *See Row Information* Yes  Treat  MEWS Interventions Other (Comment) (notified MD)  Pain Scale 0-10  Pain Score 7  Pain Type Acute pain  Pain Location Generalized  Pain Orientation Right;Left;Anterior;Posterior  Pain Descriptors / Indicators Aching  Pain Frequency Constant  Pain Onset On-going  Patients Stated Pain Goal 2  Take Vital Signs  Increase Vital Sign Frequency  Yellow: Q 2hr X 2 then Q 4hr X 2, if remains yellow, continue Q 4hrs  Escalate  MEWS: Escalate Yellow: discuss with charge nurse/RN and consider discussing with provider and RRT  Notify: Charge Nurse/RN  Name of Charge Nurse/RN Notified Adline Peals  Date Charge Nurse/RN Notified 11/30/21  Time Charge Nurse/RN Notified 1648  Notify: Provider  Provider Name/Title Olevia Bowens  Date Provider Notified 11/30/21  Time Provider Notified 1645  Method of Notification Page  Notification Reason Change in status  Assess: SIRS CRITERIA  SIRS Temperature  1  SIRS Pulse 1  SIRS Respirations  0  SIRS WBC 0  SIRS Score Sum  2

## 2021-12-01 DIAGNOSIS — E785 Hyperlipidemia, unspecified: Secondary | ICD-10-CM | POA: Diagnosis not present

## 2021-12-01 DIAGNOSIS — F411 Generalized anxiety disorder: Secondary | ICD-10-CM | POA: Diagnosis not present

## 2021-12-01 DIAGNOSIS — N12 Tubulo-interstitial nephritis, not specified as acute or chronic: Secondary | ICD-10-CM | POA: Diagnosis not present

## 2021-12-01 DIAGNOSIS — E1169 Type 2 diabetes mellitus with other specified complication: Secondary | ICD-10-CM | POA: Diagnosis not present

## 2021-12-01 DIAGNOSIS — K219 Gastro-esophageal reflux disease without esophagitis: Secondary | ICD-10-CM | POA: Diagnosis not present

## 2021-12-01 DIAGNOSIS — F330 Major depressive disorder, recurrent, mild: Secondary | ICD-10-CM | POA: Diagnosis not present

## 2021-12-01 LAB — CBC
HCT: 35.9 % — ABNORMAL LOW (ref 36.0–46.0)
Hemoglobin: 10.9 g/dL — ABNORMAL LOW (ref 12.0–15.0)
MCH: 25.2 pg — ABNORMAL LOW (ref 26.0–34.0)
MCHC: 30.4 g/dL (ref 30.0–36.0)
MCV: 83.1 fL (ref 80.0–100.0)
Platelets: 216 10*3/uL (ref 150–400)
RBC: 4.32 MIL/uL (ref 3.87–5.11)
RDW: 14.9 % (ref 11.5–15.5)
WBC: 14.8 10*3/uL — ABNORMAL HIGH (ref 4.0–10.5)
nRBC: 0 % (ref 0.0–0.2)

## 2021-12-01 LAB — BASIC METABOLIC PANEL
Anion gap: 6 (ref 5–15)
BUN: 10 mg/dL (ref 6–20)
CO2: 27 mmol/L (ref 22–32)
Calcium: 8.4 mg/dL — ABNORMAL LOW (ref 8.9–10.3)
Chloride: 107 mmol/L (ref 98–111)
Creatinine, Ser: 0.62 mg/dL (ref 0.44–1.00)
GFR, Estimated: >60 mL/min (ref >60)
Glucose, Bld: 111 mg/dL — ABNORMAL HIGH (ref 70–99)
Potassium: 3.6 mmol/L (ref 3.5–5.1)
Sodium: 140 mmol/L (ref 135–145)

## 2021-12-01 LAB — GLUCOSE, CAPILLARY
Glucose-Capillary: 110 mg/dL — ABNORMAL HIGH (ref 70–99)
Glucose-Capillary: 112 mg/dL — ABNORMAL HIGH (ref 70–99)
Glucose-Capillary: 121 mg/dL — ABNORMAL HIGH (ref 70–99)
Glucose-Capillary: 191 mg/dL — ABNORMAL HIGH (ref 70–99)

## 2021-12-01 LAB — HIV ANTIBODY (ROUTINE TESTING W REFLEX): HIV Screen 4th Generation wRfx: NONREACTIVE

## 2021-12-01 MED ORDER — FOSFOMYCIN TROMETHAMINE 3 G PO PACK
3.0000 g | PACK | Freq: Once | ORAL | Status: AC
  Administered 2021-12-01: 3 g via ORAL
  Filled 2021-12-01: qty 3

## 2021-12-01 NOTE — Progress Notes (Addendum)
[facility] Progress Note  Patient: Lauren Cardenas     UUE:[id number]  DOA: 11/29/2021   PCP: Raliegh Ip, DO       Brief hospital course: This is a 36 year old female who has diabetes mellitus, interstitial cystitis, episodes of pyelonephritis and nephrolithiasis, obesity, depression and anxiety, migraines, GERD, asthma and presents to the hospital for abdominal pain nausea vomiting and diarrhea. She was found to have a temperature of 102.9 in the ED with tachycardia and WBC count of 19.1.  CT of the abdomen revealed left-sided pyelonephritis and a 3 mm nonobstructing right renal stone.  The patient was admitted, started on IV antibiotics and IV fluids per sepsis protocol.  Subjective:  She has mild nausea today and states that her abdomen is quite tender.  She also has neck and back pain from laying in bed.  Assessment and Plan: Principal Problem:   Pyelonephritis-sepsis ruled out as there was no endorgan failure -cont ceftriaxone-I reviewed her prior culture from last year which revealed she grew some enterococci faecalis and therefore I added fosfomycin - Follow-up on urine culture and sensitivities  Active Problems:    Migraine without aura and without status migrainosus, not intractable -Continue propranolol and Imitrex    Morbid obesity (HCC) Body mass index is 37.79 kg/m.    Generalized anxiety disorder, depression -Continue Zoloft     GERD without esophagitis -Continue Pepcid    Diabetes (HCC), uncontrolled with hyperglycemia -Continue sliding scale insulin Hemoglobin A1C    Component Value Date/Time   HGBA1C 8.0 (H) 10/07/2021 0932      DVT prophylaxis:  enoxaparin (LOVENOX) injection 40 mg Start: 11/29/21 2200     Code Status: Full Code  Consultants: None Level of Care: Level of care: Med-Surg Disposition Plan:  Status is: Inpatient Remains inpatient appropriate because: Pyelonephritis  Objective:   Vitals:   12/01/21 0102  12/01/21 0426 12/01/21 0930 12/01/21 1357  BP: 130/89 119/76  119/77  Pulse: 87 78  84  Resp: 16 18  16   Temp: (!) 101.2 F (38.4 C) 98.9 F (37.2 C)  98.2 F (36.8 C)  TempSrc: Oral Oral  Oral  SpO2: 100% 99% 95% 100%  Weight:      Height:       Filed Weights   11/29/21 1028  Weight: 90.7 kg   Exam: General exam: Appears comfortable  HEENT: PERRLA, oral mucosa moist, no sclera icterus or thrush Respiratory system: Clear to auscultation. Respiratory effort normal. Cardiovascular system: S1 & S2 heard, regular rate and rhythm Gastrointestinal system: Abdomen soft, tender throughout the abdomen, nondistended. Normal bowel sounds   Central nervous system: Alert and oriented. No focal neurological deficits. Extremities: No cyanosis, clubbing or edema Skin: No rashes or ulcers Psychiatry:  Mood & affect appropriate.    Imaging and lab data was personally reviewed    CBC: Recent Labs  Lab 11/29/21 1040 11/30/21 0626  WBC 19.1* 20.2*  HGB 13.9 12.4  HCT 43.9 41.4  MCV 79.4* 83.8  PLT 219 183   Basic Metabolic Panel: Recent Labs  Lab 11/29/21 1040 11/30/21 0626  NA 137 139  K 3.9 3.4*  CL 101 107  CO2 20* 24  GLUCOSE 162* 121*  BUN 17 9  CREATININE 0.89 0.75  CALCIUM 9.6 7.9*   GFR: Estimated Creatinine Clearance: 98.8 mL/min (by C-G formula based on SCr of 0.75 mg/dL).  Scheduled Meds:  budesonide  0.25 mg Nebulization BID   enoxaparin (LOVENOX) injection  40 mg Subcutaneous Q24H  famotidine  20 mg Oral Daily   insulin aspart  0-15 Units Subcutaneous TID WC   propranolol ER  120 mg Oral QHS   rosuvastatin  5 mg Oral Daily   sertraline  100 mg Oral Daily   sodium chloride flush  3 mL Intravenous Q12H   Continuous Infusions:  cefTRIAXone (ROCEPHIN)  IV 1 g (11/30/21 2150)     LOS: 2 days   Author: Calvert Cantor  12/01/2021 5:05 PM

## 2021-12-01 NOTE — Consult Note (Addendum)
   [facility] CM Inpatient Consult   12/01/2021  Lauren Cardenas 1985/11/18 [id number]  Addendum - Follow up: 12/02/21 1400   Call attempt unsuccessful, left voicemail message requesting a return call, HIPAA compliant with call back phone number  Managed Medicaid: Healthy Blue  Coverage for [place] at [facility], remote review  Primary Care Provider:  Janora Norlander, DO, [place], is an embedded provider with a Chronic Care Management team and program, and is listed for the transition of care follow up and appointments.  Patient was screened for Managed Medicaid for  practice service needs for chronic care management  Plan: A referral can be sent to Managed Medicaid Care Management for post hospital follow up needs. Continue to follow for progress and needs  Please contact for further questions,  Natividad Brood, RN BSN [facility] Liaison  [phone] business mobile phone Toll free office [phone]  Fax number: [phone] [email or web address]

## 2021-12-01 NOTE — Progress Notes (Signed)
  Transition of Care (TOC) Screening Note   Patient Details  Name: REOLA SCHUENKE Date of Birth: 1985/08/17   Transition of Care [facility]) CM/SW Contact:    Aislee Landgren, Marjie Skiff, RN Phone Number: 12/01/2021, 10:39 AM    Transition of Care Department [facility]) has reviewed patient and no TOC needs have been identified at this time. We will continue to monitor patient advancement through interdisciplinary progression rounds. If new patient transition needs arise, please place a TOC consult.

## 2021-12-02 DIAGNOSIS — R6510 Systemic inflammatory response syndrome (SIRS) of non-infectious origin without acute organ dysfunction: Secondary | ICD-10-CM

## 2021-12-02 DIAGNOSIS — N12 Tubulo-interstitial nephritis, not specified as acute or chronic: Secondary | ICD-10-CM | POA: Diagnosis not present

## 2021-12-02 DIAGNOSIS — F330 Major depressive disorder, recurrent, mild: Secondary | ICD-10-CM | POA: Diagnosis not present

## 2021-12-02 DIAGNOSIS — F411 Generalized anxiety disorder: Secondary | ICD-10-CM | POA: Diagnosis not present

## 2021-12-02 DIAGNOSIS — E1169 Type 2 diabetes mellitus with other specified complication: Secondary | ICD-10-CM | POA: Diagnosis not present

## 2021-12-02 DIAGNOSIS — K219 Gastro-esophageal reflux disease without esophagitis: Secondary | ICD-10-CM | POA: Diagnosis not present

## 2021-12-02 DIAGNOSIS — E785 Hyperlipidemia, unspecified: Secondary | ICD-10-CM | POA: Diagnosis not present

## 2021-12-02 LAB — URINE CULTURE: Culture: NO GROWTH

## 2021-12-02 LAB — GLUCOSE, CAPILLARY: Glucose-Capillary: 121 mg/dL — ABNORMAL HIGH (ref 70–99)

## 2021-12-02 MED ORDER — LEVOFLOXACIN 750 MG PO TABS
750.0000 mg | ORAL_TABLET | Freq: Every day | ORAL | Status: DC
  Administered 2021-12-02: 750 mg via ORAL
  Filled 2021-12-02: qty 1

## 2021-12-02 MED ORDER — LEVOFLOXACIN 500 MG PO TABS: 500.0000 mg | ORAL_TABLET | Freq: Every day | ORAL | Status: DC

## 2021-12-02 MED ORDER — LEVOFLOXACIN 750 MG PO TABS: 750.0000 mg | ORAL_TABLET | Freq: Every day | ORAL | 0 refills | Status: AC

## 2021-12-02 MED ORDER — PROMETHAZINE HCL 25 MG PO TABS: 25.0000 mg | ORAL_TABLET | Freq: Four times a day (QID) | ORAL | 0 refills | Status: DC | PRN

## 2021-12-02 MED ORDER — LEVOFLOXACIN 750 MG PO TABS
750.0000 mg | ORAL_TABLET | Freq: Every day | ORAL | Status: DC
Start: 2021-12-02 — End: 2021-12-02

## 2021-12-02 NOTE — Discharge Summary (Signed)
Physician Discharge Summary   Patient: Lauren Cardenas MRN: [id number] DOB: 12-22-1985  Admit date:     11/29/2021  Discharge date: 12/02/21  Discharge Physician: Debbe Odea   PCP: Janora Norlander, DO   Recommendations at discharge:   F/u with urology for recurrent UTIs  Discharge Diagnoses: Principal Problem:   Pyelonephritis Active Problems:   Migraine without aura and without status migrainosus, not intractable   Hyperlipidemia associated with type 2 diabetes mellitus ([place])   Morbid obesity ([place])   Generalized anxiety disorder   Depression, major, recurrent ([place])   GERD without esophagitis   Diabetes ([place])   SIRS (systemic inflammatory response syndrome) ([place])  Resolved Problems:   * No resolved hospital problems. [facility] Course: This is a 36 year old female who has diabetes mellitus, interstitial cystitis, episodes of pyelonephritis and nephrolithiasis, obesity, depression and anxiety, migraines, GERD, asthma and presents to the hospital for abdominal pain nausea vomiting and diarrhea. She was found to have a temperature of 102.9 in the ED with tachycardia and WBC count of 19.1.  CT of the abdomen revealed left-sided pyelonephritis and a 3 mm nonobstructing right renal stone.  The patient was admitted, started on IV antibiotics and IV fluids per sepsis protocol.    Assessment and Plan: Principal Problem:   Pyelonephritis with SIRS- sepsis ruled out as there was no endorgan failure -cont ceftriaxone-I reviewed her prior culture from last year which revealed she grew some enterococci faecalis and therefore I added fosfomycin - Follow-up on urine culture and sensitivities   Active Problems:     Migraine without aura and without status migrainosus, not intractable -Continue propranolol and Imitrex     Morbid obesity ([place]) Body mass index is 37.79 kg/m.     Generalized anxiety disorder, depression -Continue Zoloft    GERD without esophagitis -Continue  Pepcid   Diabetes ([place]), uncontrolled with hyperglycemia -Continue sliding scale insulin Hemoglobin A1C Labs (B[place])          Diet recommendation:  Discharge Diet Orders (From admission, onward)     Start     Ordered   12/02/21 0000  Diet - low sodium heart healthy        12/02/21 1009           Cardiac and Carb modified diet DISCHARGE MEDICATION:     Medication List     TAKE these medications    acetaminophen 500 MG tablet Commonly known as: TYLENOL Take 1 tablet (500 mg total) by mouth every 6 (six) hours as needed. What changed: reasons to take this   blood glucose meter kit and supplies Dispense based on patient and insurance preference. Use up to four times daily as directed. (FOR ICD-10 E10.9, E11.9).   cetirizine 10 MG chewable tablet Commonly known as: ZYRTEC Chew 10 mg by mouth daily.   famotidine 20 MG tablet Commonly known as: PEPCID Take 1 tablet (20 mg total) by mouth daily. For acid reflux What changed:  when to take this additional instructions   fluticasone 44 MCG/ACT inhaler Commonly known as: Flovent HFA Inhale 1 puff into the lungs 2 (two) times daily.   fluticasone 50 MCG/ACT nasal spray Commonly known as: FLONASE Place 2 sprays into both nostrils daily. What changed:  when to take this reasons to take this   glucose blood test strip Use as instructed once daily as directed for sugar monitoring E11.9   hydrOXYzine 10 MG tablet Commonly known as: ATARAX Take 10 mg by mouth at bedtime as  needed ("for urinary frequency").   Lancets Misc UAD to check BGs once daily as directed E11.9   levofloxacin 750 MG tablet Commonly known as: LEVAQUIN Take 1 tablet (750 mg total) by mouth daily for 7 days.   metFORMIN 500 MG tablet Commonly known as: GLUCOPHAGE Take 500 mg by mouth at bedtime.   Nurtec 75 MG Tbdp Generic drug: Rimegepant Sulfate Take 1 tablet by mouth daily as needed. What changed:  how much to take reasons to  take this   OMEGA-3 FATTY ACIDS PO Take 1,000 mg by mouth daily.   oxybutynin 5 MG tablet Commonly known as: DITROPAN Take 5 mg by mouth 4 (four) times daily as needed for bladder spasms.   Ozempic (0.25 or 0.5 MG/DOSE) 2 MG/1.5ML Sopn Generic drug: Semaglutide(0.25 or 0.5MG/DOS) Inject 0.25 mg into the skin every 7 (seven) days for 28 days, THEN 0.5 mg every 7 (seven) days. Start taking on: October 12, 2021 What changed: See the new instructions.   ProAir HFA 108 (90 Base) MCG/ACT inhaler Generic drug: albuterol Inhale 2 puffs into the lungs every 6 (six) hours as needed for wheezing or shortness of breath.   promethazine 25 MG tablet Commonly known as: PHENERGAN Take 1 tablet (25 mg total) by mouth every 6 (six) hours as needed for nausea or vomiting.   propranolol ER 120 MG 24 hr capsule Commonly known as: INDERAL LA Take 1 capsule (120 mg total) by mouth at bedtime.   rizatriptan 10 MG disintegrating tablet Commonly known as: MAXALT-MLT Take 1 tablet (10 mg total) by mouth 3 (three) times daily as needed for migraine. What changed: reasons to take this   rosuvastatin 5 MG tablet Commonly known as: Crestor Take 1 tablet (5 mg total) by mouth daily. What changed: when to take this   sertraline 100 MG tablet Commonly known as: ZOLOFT Take 1 tablet (100 mg total) by mouth daily.   Uribel 118 MG Caps Take 1 capsule by mouth daily as needed (for urinary pain/discomfort).   Vitamin D-3 25 MCG (1000 UT) Caps Take 1,000 Units by mouth daily with lunch.        Discharge Exam: Filed Weights   11/29/21 1028  Weight: 90.7 kg    Condition at discharge: stable  The results of significant diagnostics from this hospitalization (including imaging, microbiology, ancillary and laboratory) are listed below for reference.   Imaging Studies: CT Abdomen Pelvis W Contrast  Result Date: 11/29/2021 CLINICAL DATA:  Lower abdominal pain with UTI symptoms. History of kidney stones.  EXAM: CT ABDOMEN AND PELVIS WITH CONTRAST TECHNIQUE: Multidetector CT imaging of the abdomen and pelvis was performed using the standard protocol following bolus administration of intravenous contrast. RADIATION DOSE REDUCTION: This exam was performed according to the departmental dose-optimization program which includes automated exposure control, adjustment of the mA and/or kV according to patient size and/or use of iterative reconstruction technique. CONTRAST:  120m OMNIPAQUE IOHEXOL 300 MG/ML  SOLN COMPARISON:  CT stone study 04/25/2021 FINDINGS: Lower chest: Unremarkable. Hepatobiliary: No suspicious focal abnormality within the liver parenchyma. Gallbladder surgically absent. No intrahepatic or extrahepatic biliary dilation. Pancreas: No focal mass lesion. No dilatation of the main duct. No intraparenchymal cyst. No peripancreatic edema. Spleen: No splenomegaly. No focal mass lesion. Adrenals/Urinary Tract: No adrenal nodule or mass. Cortical scarring noted in both kidneys. 3 mm nonobstructing stone identified interpolar right kidney. 2.4 cm region of hypoperfusion is identified in the anterior interpolar left kidney (sagittal 98/6) with a second 18 mm region  of parenchymal hypoperfusion towards the upper pole the left kidney sagittal 103/6). Imaging appearance is suspicious for pyelonephritis. Patient noted at a 2 mm nonobstructing stone in the upper interpolar left kidney. No evidence for hydroureter. The urinary bladder appears normal for the degree of distention. Stomach/Bowel: Stomach is unremarkable. No gastric wall thickening. No evidence of outlet obstruction. Duodenum is normally positioned as is the ligament of Treitz. No small bowel wall thickening. No small bowel dilatation. The terminal ileum is normal. The appendix is not well visualized, but there is no edema or inflammation in the region of the cecum. No gross colonic mass. No colonic wall thickening. Vascular/Lymphatic: No abdominal aortic  aneurysm. There is no gastrohepatic or hepatoduodenal ligament lymphadenopathy. No retroperitoneal or mesenteric lymphadenopathy. No pelvic sidewall lymphadenopathy. Reproductive: The uterus is unremarkable.  There is no adnexal mass. Other: No intraperitoneal free fluid. Musculoskeletal: No worrisome lytic or sclerotic osseous abnormality. IMPRESSION: 1. Two areas of parenchymal hypoperfusion/segmental edema in the left kidney, compatible with pyelonephritis. 2. 3 mm nonobstructing stone identified interpolar right kidney. Electronically Signed   By: [name] M.D.   On: 11/29/2021 14:11    Microbiology: Results for orders placed or performed during the hospital encounter of 11/29/21  Culture, blood (single)     Status: None (Preliminary result)   Collection Time: 11/29/21 12:50 PM   Specimen: BLOOD RIGHT HAND  Result Value Ref Range Status   Specimen Description   Final    BLOOD RIGHT HAND Performed at [facility], [address], [place], [place] [address]    Special Requests   Final    BOTTLES DRAWN AEROBIC AND ANAEROBIC Blood Culture adequate volume Performed at [facility], [address] [place], [place] [address]    Culture   Final    NO GROWTH 3 DAYS Performed at [facility], [place] [address] [place], [place] [address]    Report Status PENDING  Incomplete  Urine Culture     Status: None   Collection Time: 12/01/21  1:25 AM   Specimen: Urine, Clean Catch  Result Value Ref Range Status   Specimen Description   Final    URINE, CLEAN CATCH Performed at [facility], [place] [address] [place], [place] [address]    Special Requests   Final    NONE Performed at [facility], [place] [address], [place], [place] [address]    Culture   Final    NO GROWTH Performed at [facility], [place] [address], [place], [place] [address]    Report Status 12/02/2021 FINAL  Final    Labs: CBC: Recent  Labs  Lab 11/29/21 1040 11/30/21 0626 12/01/21 1756  WBC 19.1* 20.2* 14.8*  HGB 13.9 12.4 10.9*  HCT 43.9 41.4 35.9*  MCV 79.4* 83.8 83.1  PLT 219 183 497   Basic Metabolic Panel: Recent Labs  Lab 11/29/21 1040 11/30/21 0626 12/01/21 1756  NA 137 139 140  K 3.9 3.4* 3.6  CL 101 107 107  CO2 20* 24 27  GLUCOSE 162* 121* 111*  BUN _0 CREATININE 0.89 0.75 0.62  CALCIUM 9.6 7.9* 8.4*   Liver Function Tests: Recent Labs  Lab 11/29/21 1040 11/30/21 0626  AST 15 70*  ALT 17 65*  ALKPHOS 59 78  BILITOT 0.6 0.6  PROT 8.3* 6.6  ALBUMIN 4.3 3.0*   CBG: Recent Labs  Lab 12/01/21 0739 12/01/21 1159 12/01/21 1728 12/01/21 2109 12/02/21 0746  GLUCAP  110* 112* 121* 191* 121*    Discharge time spent: greater than 30 minutes.  Signed: Debbe Odea, MD [facility] 12/02/2021

## 2021-12-02 NOTE — TOC Initial Note (Signed)
Transition of Care [facility]) - Initial/Assessment Note    Patient Details  Name: Lauren Cardenas MRN: [id number] Date of Birth: 11-08-1985  Transition of Care [facility]) CM/SW Contact:    Golda Acre, RN Phone Number: 12/02/2021, 10:28 AM  Clinical Narrative:                  Transition of Care [facility]) Screening Note   Patient Details  Name: Lauren Cardenas Date of Birth: 1985/08/29   Transition of Care [facility]) CM/SW Contact:    Golda Acre, RN Phone Number: 12/02/2021, 10:28 AM    Transition of Care Department (TOC) has reviewed patient and no TOC needs have been identified at this time. We will continue to monitor patient advancement through interdisciplinary progression rounds. If new patient transition needs arise, please place a TOC consult.    Expected Discharge Plan: Home/Self Care Barriers to Discharge: No Barriers Identified   Patient Goals and CMS Choice Patient states their goals for this hospitalization and ongoing recovery are:: to go home CMS Medicare.gov Compare Post Acute Care list provided to:: Patient    Expected Discharge Plan and Services Expected Discharge Plan: Home/Self Care   Discharge Planning Services: CM Consult   Living arrangements for the past 2 months: Single Family Home Expected Discharge Date: 12/02/21                                    Prior Living Arrangements/Services Living arrangements for the past 2 months: Single Family Home Lives with:: Spouse Patient language and need for interpreter reviewed:: Yes Do you feel safe going back to the place where you live?: Yes            Criminal Activity/Legal Involvement Pertinent to Current Situation/Hospitalization: No - Comment as needed  Activities of Daily Living Home Assistive Devices/Equipment: None ADL Screening (condition at time of admission) Patient's cognitive ability adequate to safely complete daily activities?: Yes Is the patient deaf or have difficulty  hearing?: No Does the patient have difficulty seeing, even when wearing glasses/contacts?: No Does the patient have difficulty concentrating, remembering, or making decisions?: No Patient able to express need for assistance with ADLs?: Yes Does the patient have difficulty dressing or bathing?: No Independently performs ADLs?: Yes (appropriate for developmental age) Does the patient have difficulty walking or climbing stairs?: No Weakness of Legs: Both Weakness of Arms/Hands: None  Permission Sought/Granted                  Emotional Assessment Appearance:: Appears stated age     Orientation: : Oriented to Place, Oriented to Self, Oriented to  Time, Oriented to Situation Alcohol / Substance Use: Not Applicable Psych Involvement: No (comment)  Admission diagnosis:  Pyelonephritis [N12] Tachycardia [R00.0] Patient Active Problem List   Diagnosis Date Noted   Diabetes (HCC) 10/12/2021   Sunburn, blistering 01/18/2021   BMI 37.0-37.9, adult 05/06/2019   GERD without esophagitis 08/31/2018   Wheezing 08/31/2018   Depression, major, recurrent (HCC) 04/25/2018   Generalized anxiety disorder 04/04/2018   Pyelonephritis 11/04/2017   Postoperative upper abdominal pain 10/31/2017   Prediabetes 05/30/2017   Vitamin D deficiency 05/30/2017   Hyperlipidemia associated with type 2 diabetes mellitus (HCC) 05/30/2017   Morbid obesity (HCC) 05/30/2017   Migraine without aura and without status migrainosus, not intractable 05/09/2017   Other fatigue 03/13/2017   Overactive bladder 11/14/2015   Syncope and collapse 09/27/2015  Nephrolithiasis 09/27/2015   Sepsis (HCC) 09/27/2015   Interstitial cystitis 09/27/2015   [place]-ureteral reflux 09/15/2014   History of kidney stones 07/28/2014   Syncope 03/20/2012   PCP:  Raliegh Ip, DO Pharmacy:   [facility] [address] [place] - [address] [place] [address] Phone: [phone] Fax:  [phone]     Social Determinants of Health (SDOH) Interventions    Readmission Risk Interventions     No data to display

## 2021-12-02 NOTE — TOC Transition Note (Signed)
Transition of Care [facility]) - CM/SW Discharge Note   Patient Details  Name: Lauren Cardenas MRN: [id number] Date of Birth: January 09, 1986  Transition of Care [facility]) CM/SW Contact:  Golda Acre, RN Phone Number: 12/02/2021, 10:28 AM   Clinical Narrative:    Dcd to home with self care.   Final next level of care: Home/Self Care Barriers to Discharge: No Barriers Identified   Patient Goals and CMS Choice Patient states their goals for this hospitalization and ongoing recovery are:: to go home CMS Medicare.gov Compare Post Acute Care list provided to:: Patient    Discharge Placement                       Discharge Plan and Services   Discharge Planning Services: CM Consult                                 Social Determinants of Health (SDOH) Interventions     Readmission Risk Interventions     No data to display

## 2021-12-03 ENCOUNTER — Telehealth: Payer: Self-pay

## 2021-12-03 NOTE — Telephone Encounter (Signed)
Transition Care Management Unsuccessful Follow-up Telephone Call  Date of discharge and from where:  12/02/2021 from [facility] inpatient  Attempts:  1st Attempt  Reason for unsuccessful TCM follow-up call:  Left voice message

## 2021-12-04 LAB — CULTURE, BLOOD (SINGLE)
Culture: NO GROWTH
Special Requests: ADEQUATE

## 2021-12-06 NOTE — Telephone Encounter (Signed)
Transition Care Management Unsuccessful Follow-up Telephone Call  Date of discharge and from where:  12/02/2021 from [facility] inpatient  Attempts:  2nd Attempt  Reason for unsuccessful TCM follow-up call:  Left voice message

## 2021-12-07 NOTE — Telephone Encounter (Signed)
Transition Care Management Unsuccessful Follow-up Telephone Call  Date of discharge and from where:  12/02/2021-[facility]  Attempts:  3rd Attempt  Reason for unsuccessful TCM follow-up call:  Left voice message

## 2021-12-24 DIAGNOSIS — N10 Acute tubulo-interstitial nephritis: Secondary | ICD-10-CM | POA: Diagnosis not present

## 2021-12-24 DIAGNOSIS — R8271 Bacteriuria: Secondary | ICD-10-CM | POA: Diagnosis not present

## 2021-12-24 DIAGNOSIS — N3020 Other chronic cystitis without hematuria: Secondary | ICD-10-CM | POA: Diagnosis not present

## 2021-12-31 DIAGNOSIS — J020 Streptococcal pharyngitis: Secondary | ICD-10-CM | POA: Diagnosis not present

## 2022-01-14 ENCOUNTER — Ambulatory Visit: Payer: Medicaid Other | Admitting: Family Medicine

## 2022-01-14 ENCOUNTER — Encounter: Payer: Self-pay | Admitting: Family Medicine

## 2022-01-14 VITALS — BP 117/80 | HR 73 | Temp 97.2°F | Ht 61.0 in | Wt 195.0 lb

## 2022-01-14 DIAGNOSIS — E785 Hyperlipidemia, unspecified: Secondary | ICD-10-CM

## 2022-01-14 DIAGNOSIS — Z87448 Personal history of other diseases of urinary system: Secondary | ICD-10-CM | POA: Diagnosis not present

## 2022-01-14 DIAGNOSIS — E1169 Type 2 diabetes mellitus with other specified complication: Secondary | ICD-10-CM

## 2022-01-14 DIAGNOSIS — E119 Type 2 diabetes mellitus without complications: Secondary | ICD-10-CM | POA: Diagnosis not present

## 2022-01-14 LAB — BAYER DCA HB A1C WAIVED: HB A1C (BAYER DCA - WAIVED): 7 % — ABNORMAL HIGH (ref 4.8–5.6)

## 2022-01-14 MED ORDER — SEMAGLUTIDE (1 MG/DOSE) 4 MG/3ML ~~LOC~~ SOPN: 1.0000 mg | PEN_INJECTOR | SUBCUTANEOUS | 1 refills | Status: DC

## 2022-01-14 NOTE — Patient Instructions (Signed)
Sugar technically controlled but borderline with A1c 7.0 today. Ozempic increased to 1 mg weekly. HYDRATE

## 2022-01-14 NOTE — Progress Notes (Signed)
Subjective: CC:DM PCP: Janora Norlander, DO OMB:TDHRCBU Lauren Cardenas is a 36 y.o. female presenting to clinic today for:  1. Type 2 Diabetes with hypertension, hyperlipidemia with obesity:  New onset diabetes in 2023.  She was started on metformin and Ozempic.  She is compliant with Ozempic 0.5 mg q. 7 days.  Tolerating this without difficulty.  She is monitoring blood sugars very closely and has had no hypoglycemic episodes.  Typically her blood sugars are less than 180s.  Certainly nothing above 180.  No reports of visual disturbance, chest pain, shortness of breath, edema.  She unfortunately was hospitalized with pyelonephritis last month.  She admits that perhaps she was not hydrating well enough but is working on this.  She is due for diabetic eye exam and will schedule this.  Compliant with Crestor and Inderal.  She is very motivated to get her sugar and weight down because she understands the severe repercussions of uncontrolled blood sugar as she watched her family members to suffer through these complications of amputations, vision loss etc.  Last eye exam: Needs Last foot exam: Up-to-date Last A1c:  Lab Results  Component Value Date   HGBA1C 7.0 (H) 01/14/2022   Nephropathy screen indicated?:  Up-to-date Last flu, zoster and/or pneumovax:  Immunization History  Administered Date(s) Administered   DTaP 03/15/1989, 03/12/1990   Hepatitis B 04/04/1996, 05/13/1996, 10/15/1996   IPV 03/15/1989, 03/12/1990   Influenza Split 06/14/2012   Influenza,inj,Quad PF,6+ Mos 08/17/2017, 04/04/2018, 05/06/2019   MMR 03/12/1990   PNEUMOCOCCAL CONJUGATE-20 12/01/2021   Tdap 06/16/2012    ROS: Per HPI  Allergies  Allergen Reactions   Amoxicillin Hives and Other (See Comments)    Got ceftriaxone in the past Has patient had a PCN reaction causing immediate rash, facial/tongue/throat swelling, SOB or lightheadedness with hypotension: Yes Has patient had a PCN reaction causing severe rash  involving mucus membranes or skin necrosis: No Has patient had a PCN reaction that required hospitalization: No Has patient had a PCN reaction occurring within the last 10 years: Yes If all of the above answers are "NO", then may proceed with Cephalosporin use.   Sudafed [Pseudoephedrine Hcl] Shortness Of Breath   Past Medical History:  Diagnosis Date   Acute pyelonephritis 09/27/2015   Chronic migraine    neurologist-  dr Jaynee Eagles   Depression 05/30/2017   with emotional eating   Diabetes mellitus without complication ([place])    DM type 2   Dysuria    GERD (gastroesophageal reflux disease)    History of acute pyelonephritis    09-27-2015:   11-04-2017  w/ sepsis due to obstruction from kidney stone   History of COVID-19 07/02/2020   mild symptoms x few days all symptoms resolved   History of kidney stones    IC (interstitial cystitis)    Left ureteral stone    OAB (overactive bladder)    Urge urinary incontinence    Vesicoureteral reflux     Current Outpatient Medications:    acetaminophen (TYLENOL) 500 MG tablet, Take 1 tablet (500 mg total) by mouth every 6 (six) hours as needed. (Patient taking differently: Take 500 mg by mouth every 6 (six) hours as needed for mild pain.), Disp: 30 tablet, Rfl: 0   blood glucose meter kit and supplies, Dispense based on patient and insurance preference. Use up to four times daily as directed. (FOR ICD-10 E10.9, E11.9)., Disp: 1 each, Rfl: 0   cetirizine (ZYRTEC) 10 MG chewable tablet, Chew 10 mg by mouth  daily., Disp: , Rfl:    Cholecalciferol (VITAMIN Lauren-3) 25 MCG (1000 UT) CAPS, Take 1,000 Units by mouth daily with lunch., Disp: , Rfl:    famotidine (PEPCID) 20 MG tablet, Take 1 tablet (20 mg total) by mouth daily. For acid reflux (Patient taking differently: Take 20 mg by mouth in the morning and at bedtime.), Disp: 100 tablet, Rfl: 3   fluticasone (FLONASE) 50 MCG/ACT nasal spray, Place 2 sprays into both nostrils daily. (Patient taking  differently: Place 2 sprays into both nostrils daily as needed for allergies or rhinitis.), Disp: 16 g, Rfl: 6   fluticasone (FLOVENT HFA) 44 MCG/ACT inhaler, Inhale 1 puff into the lungs 2 (two) times daily., Disp: 31.8 each, Rfl: 3   glucose blood test strip, Use as instructed once daily as directed for sugar monitoring E11.9, Disp: 100 each, Rfl: 3   hydrOXYzine (ATARAX) 10 MG tablet, Take 10 mg by mouth at bedtime as needed ("for urinary frequency")., Disp: , Rfl:    Lancets MISC, UAD to check BGs once daily as directed E11.9, Disp: 100 each, Rfl: 3   metFORMIN (GLUCOPHAGE) 500 MG tablet, Take 500 mg by mouth at bedtime., Disp: , Rfl:    Meth-Hyo-M Bl-Na Phos-Ph Sal (URIBEL) 118 MG CAPS, Take 1 capsule by mouth daily as needed (for urinary pain/discomfort)., Disp: , Rfl:    OMEGA-3 FATTY ACIDS PO, Take 1,000 mg by mouth daily., Disp: , Rfl:    oxybutynin (DITROPAN) 5 MG tablet, Take 5 mg by mouth 4 (four) times daily as needed for bladder spasms., Disp: , Rfl:    PROAIR HFA 108 (90 Base) MCG/ACT inhaler, Inhale 2 puffs into the lungs every 6 (six) hours as needed for wheezing or shortness of breath., Disp: 8 g, Rfl: 3   promethazine (PHENERGAN) 25 MG tablet, Take 1 tablet (25 mg total) by mouth every 6 (six) hours as needed for nausea or vomiting., Disp: 30 tablet, Rfl: 0   propranolol ER (INDERAL LA) 120 MG 24 hr capsule, Take 1 capsule (120 mg total) by mouth at bedtime., Disp: 100 capsule, Rfl: 3   Rimegepant Sulfate (NURTEC) 75 MG TBDP, Take 1 tablet by mouth daily as needed. (Patient taking differently: Take 75 mg by mouth daily as needed (for migraines).), Disp: 2 tablet, Rfl: 0   rizatriptan (MAXALT-MLT) 10 MG disintegrating tablet, Take 1 tablet (10 mg total) by mouth 3 (three) times daily as needed for migraine. (Patient taking differently: Take 10 mg by mouth 3 (three) times daily as needed for migraine (dissolve orally).), Disp: 9 tablet, Rfl: 11   rosuvastatin (CRESTOR) 5 MG tablet,  Take 1 tablet (5 mg total) by mouth daily. (Patient taking differently: Take 5 mg by mouth at bedtime.), Disp: 90 tablet, Rfl: 3   sertraline (ZOLOFT) 100 MG tablet, Take 1 tablet (100 mg total) by mouth daily., Disp: 100 tablet, Rfl: 3 Social History   Socioeconomic History   Marital status: Married    Spouse name: Broadus John   Number of children: 1   Years of education: 12   Highest education level: Not on file  Occupational History   Occupation: [employer]  Tobacco Use   Smoking status: Never   Smokeless tobacco: Never  Vaping Use   Vaping Use: Never used  Substance and Sexual Activity   Alcohol use: No   Drug use: No   Sexual activity: Yes    Birth control/protection: None  Other Topics Concern   Not on file  Social  History Narrative   Lives with husband and daughter and in-laws   Daughter, Summer, with nonverbal Autism   Her spouse (who works from home) is very supportive   Caffeine use: 100-144m/week   She drinks about 3 cups of diet caffeine soda per week   [place] [employer] [place]  Social Determinants of Health   Financial Resource Strain: Not on file  Food Insecurity: Not on file  Transportation Needs: Not on file  Physical Activity: Not on file  Stress: Not on file  Social Connections: Not on file  Intimate Partner Violence: Not on file   Family History  Problem Relation Age of Onset   Hypertension Mother    Obesity Mother    Diabetes Father    Kidney disease Father    Sudden death Father     Objective: Office vital signs reviewed. BP 117/80   Pulse 73   Temp (!) 97.2 F (36.2 C)   Ht '5\' 1"'  (1.549 m)   Wt 195 lb (88.5 kg)   SpO2 94%   BMI 36.84 kg/m   Physical Examination:  General: Awake, alert, morbidly obese, No acute distress HEENT: Sclera white.  Moist mucous membranes Cardio: regular rate and rhythm, S1S2 heard, no murmurs appreciated Pulm: clear to auscultation bilaterally, no wheezes,  rhonchi or rales; normal work of breathing on room air   Assessment/ Plan: 36y.o. female   Controlled type 2 diabetes mellitus with other specified complication, without long-term current use of insulin ([place] - Plan: Bayer DCA Hb A1c Waived, Semaglutide, 1 MG/DOSE, 4 MG/3ML SOPN  Hyperlipidemia associated with type 2 diabetes mellitus ([place] - Plan: Semaglutide, 1 MG/DOSE, 4 MG/3ML SOPN  Morbid obesity ([place] - Plan: Semaglutide, 1 MG/DOSE, 4 MG/3ML SOPN  History of pyelonephritis  Sugar technically controlled but borderline with A1c of 7.0.  This is however a reduction from her previous A1c of 8.0 so she is moving in the right direction.  I am going to advance her Ozempic further to 1 mg weekly.  Continue metformin.  Continue statin.  She needs to schedule diabetic eye exam.  Plan for fasting labs at our next visit along with repeat a1c in 3 months.  Encourage p.o. hydration to avoid recurrent UTI  Orders Placed This Encounter  Procedures   Bayer DCA Hb A1c Waived   No orders of the defined types were placed in this encounter.    AJanora Norlander DO [facility]([phone]

## 2022-01-27 DIAGNOSIS — N10 Acute tubulo-interstitial nephritis: Secondary | ICD-10-CM | POA: Diagnosis not present

## 2022-01-27 DIAGNOSIS — N3020 Other chronic cystitis without hematuria: Secondary | ICD-10-CM | POA: Diagnosis not present

## 2022-02-02 LAB — HM DIABETES EYE EXAM

## 2022-03-16 ENCOUNTER — Other Ambulatory Visit: Payer: Self-pay

## 2022-03-16 ENCOUNTER — Emergency Department (HOSPITAL_BASED_OUTPATIENT_CLINIC_OR_DEPARTMENT_OTHER)
Admission: EM | Admit: 2022-03-16 | Discharge: 2022-03-16 | Disposition: A | Discharge status: Home / Self Care | Payer: Medicaid Other | Attending: Emergency Medicine | Admitting: Emergency Medicine

## 2022-03-16 ENCOUNTER — Emergency Department (HOSPITAL_BASED_OUTPATIENT_CLINIC_OR_DEPARTMENT_OTHER): Payer: Medicaid Other | Admitting: Radiology

## 2022-03-16 ENCOUNTER — Emergency Department (HOSPITAL_BASED_OUTPATIENT_CLINIC_OR_DEPARTMENT_OTHER): Payer: Medicaid Other

## 2022-03-16 ENCOUNTER — Encounter (HOSPITAL_BASED_OUTPATIENT_CLINIC_OR_DEPARTMENT_OTHER): Payer: Self-pay

## 2022-03-16 DIAGNOSIS — Z794 Long term (current) use of insulin: Secondary | ICD-10-CM | POA: Diagnosis not present

## 2022-03-16 DIAGNOSIS — E119 Type 2 diabetes mellitus without complications: Secondary | ICD-10-CM | POA: Diagnosis not present

## 2022-03-16 DIAGNOSIS — N200 Calculus of kidney: Secondary | ICD-10-CM | POA: Diagnosis not present

## 2022-03-16 DIAGNOSIS — Z7984 Long term (current) use of oral hypoglycemic drugs: Secondary | ICD-10-CM | POA: Diagnosis not present

## 2022-03-16 DIAGNOSIS — N281 Cyst of kidney, acquired: Secondary | ICD-10-CM | POA: Diagnosis not present

## 2022-03-16 DIAGNOSIS — D649 Anemia, unspecified: Secondary | ICD-10-CM | POA: Diagnosis not present

## 2022-03-16 DIAGNOSIS — R509 Fever, unspecified: Secondary | ICD-10-CM | POA: Diagnosis present

## 2022-03-16 DIAGNOSIS — D72829 Elevated white blood cell count, unspecified: Secondary | ICD-10-CM | POA: Insufficient documentation

## 2022-03-16 DIAGNOSIS — N10 Acute tubulo-interstitial nephritis: Secondary | ICD-10-CM | POA: Insufficient documentation

## 2022-03-16 DIAGNOSIS — R000 Tachycardia, unspecified: Secondary | ICD-10-CM | POA: Diagnosis not present

## 2022-03-16 DIAGNOSIS — Z20822 Contact with and (suspected) exposure to covid-19: Secondary | ICD-10-CM | POA: Insufficient documentation

## 2022-03-16 LAB — CBC WITH DIFFERENTIAL/PLATELET
Abs Immature Granulocytes: 0.1 10*3/uL — ABNORMAL HIGH (ref 0.00–0.07)
Basophils Absolute: 0.1 10*3/uL (ref 0.0–0.1)
Basophils Relative: 1 %
Eosinophils Absolute: 0.2 10*3/uL (ref 0.0–0.5)
Eosinophils Relative: 1 %
HCT: 34.3 % — ABNORMAL LOW (ref 36.0–46.0)
Hemoglobin: 11 g/dL — ABNORMAL LOW (ref 12.0–15.0)
Immature Granulocytes: 1 %
Lymphocytes Relative: 12 %
Lymphs Abs: 2.1 10*3/uL (ref 0.7–4.0)
MCH: 24.8 pg — ABNORMAL LOW (ref 26.0–34.0)
MCHC: 32.1 g/dL (ref 30.0–36.0)
MCV: 77.3 fL — ABNORMAL LOW (ref 80.0–100.0)
Monocytes Absolute: 1.3 10*3/uL — ABNORMAL HIGH (ref 0.1–1.0)
Monocytes Relative: 7 %
Neutro Abs: 13.4 10*3/uL — ABNORMAL HIGH (ref 1.7–7.7)
Neutrophils Relative %: 78 %
Platelets: 336 10*3/uL (ref 150–400)
RBC: 4.44 MIL/uL (ref 3.87–5.11)
RDW: 14.7 % (ref 11.5–15.5)
WBC: 17.1 10*3/uL — ABNORMAL HIGH (ref 4.0–10.5)
nRBC: 0 % (ref 0.0–0.2)

## 2022-03-16 LAB — COMPREHENSIVE METABOLIC PANEL
ALT: 27 U/L (ref 0–44)
AST: 24 U/L (ref 15–41)
Albumin: 3.9 g/dL (ref 3.5–5.0)
Alkaline Phosphatase: 63 U/L (ref 38–126)
Anion gap: 13 (ref 5–15)
BUN: 12 mg/dL (ref 6–20)
CO2: 20 mmol/L — ABNORMAL LOW (ref 22–32)
Calcium: 8.8 mg/dL — ABNORMAL LOW (ref 8.9–10.3)
Chloride: 102 mmol/L (ref 98–111)
Creatinine, Ser: 0.8 mg/dL (ref 0.44–1.00)
GFR, Estimated: >60 mL/min (ref >60)
Glucose, Bld: 102 mg/dL — ABNORMAL HIGH (ref 70–99)
Potassium: 4.9 mmol/L (ref 3.5–5.1)
Sodium: 135 mmol/L (ref 135–145)
Total Bilirubin: 0.4 mg/dL (ref 0.3–1.2)
Total Protein: 7.2 g/dL (ref 6.5–8.1)

## 2022-03-16 LAB — RESP PANEL BY RT-PCR (FLU A&B, COVID) ARPGX2
Influenza A by PCR: NEGATIVE
Influenza B by PCR: NEGATIVE
SARS Coronavirus 2 by RT PCR: NEGATIVE

## 2022-03-16 LAB — URINALYSIS, ROUTINE W REFLEX MICROSCOPIC
Bilirubin Urine: NEGATIVE
Glucose, UA: NEGATIVE mg/dL
Ketones, ur: NEGATIVE mg/dL
Nitrite: NEGATIVE
Protein, ur: 30 mg/dL — AB
Specific Gravity, Urine: 1.016 (ref 1.005–1.030)
pH: 6 (ref 5.0–8.0)

## 2022-03-16 LAB — LACTIC ACID, PLASMA: Lactic Acid, Venous: 0.8 mmol/L (ref 0.5–1.9)

## 2022-03-16 LAB — PREGNANCY, URINE: Preg Test, Ur: NEGATIVE

## 2022-03-16 MED ORDER — CIPROFLOXACIN HCL 500 MG PO TABS: 500.0000 mg | ORAL_TABLET | Freq: Two times a day (BID) | ORAL | 0 refills | Status: AC

## 2022-03-16 MED ORDER — SODIUM CHLORIDE 0.9 % IV SOLN
2.0000 g | Freq: Once | INTRAVENOUS | Status: AC
  Administered 2022-03-16: 2 g via INTRAVENOUS
  Filled 2022-03-16: qty 20

## 2022-03-16 MED ORDER — IOHEXOL 300 MG/ML  SOLN
100.0000 mL | Freq: Once | INTRAMUSCULAR | Status: AC | PRN
  Administered 2022-03-16: 85 mL via INTRAVENOUS

## 2022-03-16 MED ORDER — LACTATED RINGERS IV BOLUS
1000.0000 mL | Freq: Once | INTRAVENOUS | Status: AC
  Administered 2022-03-16: 1000 mL via INTRAVENOUS

## 2022-03-16 MED ORDER — ACETAMINOPHEN 325 MG PO TABS
650.0000 mg | ORAL_TABLET | Freq: Once | ORAL | Status: AC | PRN
  Administered 2022-03-16: 650 mg via ORAL
  Filled 2022-03-16: qty 2

## 2022-03-16 MED ORDER — IBUPROFEN 400 MG PO TABS
600.0000 mg | ORAL_TABLET | Freq: Once | ORAL | Status: AC
  Administered 2022-03-16: 600 mg via ORAL
  Filled 2022-03-16: qty 1

## 2022-03-16 MED ORDER — ONDANSETRON 4 MG PO TBDP: 4.0000 mg | ORAL_TABLET | Freq: Three times a day (TID) | ORAL | 0 refills | Status: DC | PRN

## 2022-03-16 NOTE — ED Provider Notes (Signed)
[facility] EMERGENCY DEPT Provider Note   CSN: [id number] Arrival date & time: 03/16/22  1444     History  Chief Complaint  Patient presents with   Fever    Lauren Cardenas is a 36 y.o. female.  36 year old female presents today for evaluation of fever, chills, fatigue since around 1 PM today.  Denies recent sick contacts.  Denies URI symptoms.  She states she had flank pain that was left-sided yesterday.  None today.  Denies dysuria.  States she does have history of UTIs, and follows with urologist.  Has history of sepsis from UTIs.  Has not taken anything prior to arrival.  The history is provided by the patient. No language interpreter was used.       Home Medications Prior to Admission medications   Medication Sig Start Date End Date Taking? Authorizing Provider  acetaminophen (TYLENOL) 500 MG tablet Take 1 tablet (500 mg total) by mouth every 6 (six) hours as needed. Patient taking differently: Take 500 mg by mouth every 6 (six) hours as needed for mild pain. 01/18/21   Ivy Lynn, NP  blood glucose meter kit and supplies Dispense based on patient and insurance preference. Use up to four times daily as directed. (FOR ICD-10 E10.9, E11.9). 10/12/21   Janora Norlander, DO  cetirizine (ZYRTEC) 10 MG chewable tablet Chew 10 mg by mouth daily.    [provider]  Cholecalciferol (VITAMIN D-3) 25 MCG (1000 UT) CAPS Take 1,000 Units by mouth daily with lunch.    [provider]  famotidine (PEPCID) 20 MG tablet Take 1 tablet (20 mg total) by mouth daily. For acid reflux Patient taking differently: Take 20 mg by mouth in the morning and at bedtime. 10/07/21   Janora Norlander, DO  fluticasone (FLONASE) 50 MCG/ACT nasal spray Place 2 sprays into both nostrils daily. Patient taking differently: Place 2 sprays into both nostrils daily as needed for allergies or rhinitis. 06/25/21   Baruch Gouty, FNP  fluticasone (FLOVENT HFA) 44 MCG/ACT  inhaler Inhale 1 puff into the lungs 2 (two) times daily. 10/07/21   Ronnie Doss M, DO  glucose blood test strip Use as instructed once daily as directed for sugar monitoring E11.9 10/12/21   Janora Norlander, DO  hydrOXYzine (ATARAX) 10 MG tablet Take 10 mg by mouth at bedtime as needed ("for urinary frequency").    [provider]  Lancets MISC UAD to check BGs once daily as directed E11.9 10/12/21   Ronnie Doss M, DO  metFORMIN (GLUCOPHAGE) 500 MG tablet Take 500 mg by mouth at bedtime.    [provider]  Meth-Hyo-M Bl-Na Phos-Ph Sal (URIBEL) 118 MG CAPS Take 1 capsule by mouth daily as needed (for urinary pain/discomfort). 12/17/18   [provider]  OMEGA-3 FATTY ACIDS PO Take 1,000 mg by mouth daily.    [provider]  oxybutynin (DITROPAN) 5 MG tablet Take 5 mg by mouth 4 (four) times daily as needed for bladder spasms.    [provider]  PROAIR HFA 108 [phone] Base) MCG/ACT inhaler Inhale 2 puffs into the lungs every 6 (six) hours as needed for wheezing or shortness of breath. 12/24/20   Janora Norlander, DO  promethazine (PHENERGAN) 25 MG tablet Take 1 tablet (25 mg total) by mouth every 6 (six) hours as needed for nausea or vomiting. 12/02/21   Debbe Odea, MD  propranolol ER (INDERAL LA) 120 MG 24 hr capsule Take 1 capsule (120 mg  total) by mouth at bedtime. 10/07/21   Janora Norlander, DO  Rimegepant Sulfate (NURTEC) 75 MG TBDP Take 1 tablet by mouth daily as needed. Patient taking differently: Take 75 mg by mouth daily as needed (for migraines). 10/05/20   Janora Norlander, DO  rizatriptan (MAXALT-MLT) 10 MG disintegrating tablet Take 1 tablet (10 mg total) by mouth 3 (three) times daily as needed for migraine. Patient taking differently: Take 10 mg by mouth 3 (three) times daily as needed for migraine (dissolve orally). 09/06/18   Baruch Gouty, FNP  rosuvastatin (CRESTOR) 5 MG tablet Take 1 tablet (5 mg total) by mouth  daily. Patient taking differently: Take 5 mg by mouth at bedtime. 10/12/21   Janora Norlander, DO  Semaglutide, 1 MG/DOSE, 4 MG/3ML SOPN Inject 1 mg as directed once a week. **dose change 01/14/22   Ronnie Doss M, DO  sertraline (ZOLOFT) 100 MG tablet Take 1 tablet (100 mg total) by mouth daily. 10/07/21   Janora Norlander, DO      Allergies    Amoxicillin and Sudafed [pseudoephedrine hcl]    Review of Systems   Review of Systems  Constitutional:  Positive for chills, fatigue and fever.  Respiratory:  Negative for shortness of breath.   Cardiovascular:  Negative for chest pain.  Gastrointestinal:  Negative for abdominal pain.  Genitourinary:  Positive for flank pain. Negative for difficulty urinating, dysuria and vaginal discharge.  Neurological:  Negative for weakness.  All other systems reviewed and are negative.   Physical Exam Updated Vital Signs BP (!) 141/99 (BP Location: Right Arm)   Pulse (!) 110   Temp (!) 102.9 F (39.4 C) (Oral)   Resp 16   Ht '5\' 1"'  (1.549 m)   Wt 88.5 kg   SpO2 100%   BMI 36.87 kg/m  Physical Exam Vitals and nursing note reviewed.  Constitutional:      General: She is not in acute distress.    Appearance: Normal appearance. She is not ill-appearing.  HENT:     Head: Normocephalic and atraumatic.     Nose: Nose normal.  Eyes:     General: No scleral icterus.    Extraocular Movements: Extraocular movements intact.     Conjunctiva/sclera: Conjunctivae normal.  Cardiovascular:     Rate and Rhythm: Regular rhythm. Tachycardia present.     Pulses: Normal pulses.  Pulmonary:     Effort: Pulmonary effort is normal. No respiratory distress.     Breath sounds: Normal breath sounds. No wheezing or rales.  Abdominal:     General: There is no distension.     Palpations: Abdomen is soft.     Tenderness: There is no abdominal tenderness. There is no guarding.  Musculoskeletal:        General: Normal range of motion.     Cervical back:  Normal range of motion.  Skin:    General: Skin is warm and dry.  Neurological:     General: No focal deficit present.     Mental Status: She is alert. Mental status is at baseline.     ED Results / Procedures / Treatments   Labs (all labs ordered are listed, but only abnormal results are displayed) Labs Reviewed  RESP PANEL BY RT-PCR (FLU A&B, COVID) ARPGX2  CBC WITH DIFFERENTIAL/PLATELET  COMPREHENSIVE METABOLIC PANEL  URINALYSIS, ROUTINE W REFLEX MICROSCOPIC    EKG None  Radiology No results found.  Procedures .Critical Care  Performed by: Evlyn Courier, PA-C Authorized by: Deatra Canter,  Rosibel Giacobbe, PA-C   Critical care provider statement:    Critical care time (minutes):  30   Critical care was necessary to treat or prevent imminent or life-threatening deterioration of the following conditions:  Sepsis   Critical care was time spent personally by me on the following activities:  Development of treatment plan with patient or surrogate, discussions with consultants, evaluation of patient's response to treatment, examination of patient, ordering and review of laboratory studies, ordering and review of radiographic studies, ordering and performing treatments and interventions, pulse oximetry, re-evaluation of patient's condition and review of old charts     Medications Ordered in ED Medications  lactated ringers bolus 1,000 mL (has no administration in time range)  acetaminophen (TYLENOL) tablet 650 mg (650 mg Oral Given 03/16/22 1453)    ED Course/ Medical Decision Making/ A&P Clinical Course as of 03/16/22 2034  Wed Mar 16, 2022  1815 Following Motrin, fluids vitals improved.  Patient remains well-appearing.  Has leukocytosis of 17,000.  Mild anemia.  Work-up otherwise reassuring.  Preserved renal function.  UA without significant concern for UTI however does have leukocytes, many bacteria but only 6-10 WBCs.  Given flank pain yesterday will cover for pyelonephritis with Rocephin.  Will  obtain CT abdomen pelvis to rule out acute infected stone.  We will add on lactic acid, blood cultures. [AA]    Clinical Course User Index [AA] Evlyn Courier, PA-C                           Medical Decision Making Amount and/or Complexity of Data Reviewed Labs: ordered. Radiology: ordered.  Risk OTC drugs. Prescription drug management.   Medical Decision Making / ED Course   This patient presents to the ED for concern of fever, this involves an extensive number of treatment options, and is a complaint that carries with it a high risk of complications and morbidity.  The differential diagnosis includes viral URI, UTI, pneumonia, pyelonephritis  MDM: 36 year old female presents with above-mentioned complaints.  She is overall well-appearing however tachycardic and febrile on arrival.  Initial work-up was negative for COVID and flu, and no pneumonia on chest x-ray.  CBC with leukocytosis of 17,000.  Mild anemia.  CMP with preserved renal function, normal electrolytes.  UA with mild changes consistent with UTI.  Urine culture ordered.  Will expand work-up to include lactic acid, blood cultures, CT abdomen pelvis. Lactic acid within normal limit.  Blood cultures obtained.  Will give dose of Rocephin.  CT abdomen pelvis with mild left-sided perinephric inflammatory fat stranding associated with acute pyelonephritis.  No evidence of stone within the ureter.  Shared decision making had with patient regarding admission versus discharge.  Patient feels comfortable with discharge and follow-up closely with her PCP.  She also follows with [facility].  Strict return precautions discussed.  Patient voices understanding and is in agreement with plan.    Lab Tests: -I ordered, reviewed, and interpreted labs.   The pertinent results include:   Labs Reviewed  COMPREHENSIVE METABOLIC PANEL - Abnormal; Notable for the following components:      Result Value   CO2 20 (*)    Glucose, Bld 102 (*)     Calcium 8.8 (*)    All other components within normal limits  URINALYSIS, ROUTINE W REFLEX MICROSCOPIC - Abnormal; Notable for the following components:   APPearance HAZY (*)    Hgb urine dipstick LARGE (*)    Protein, ur 30 (*)  Leukocytes,Ua SMALL (*)    Bacteria, UA MANY (*)    All other components within normal limits  CBC WITH DIFFERENTIAL/PLATELET - Abnormal; Notable for the following components:   WBC 17.1 (*)    Hemoglobin 11.0 (*)    HCT 34.3 (*)    MCV 77.3 (*)    MCH 24.8 (*)    Neutro Abs 13.4 (*)    Monocytes Absolute 1.3 (*)    Abs Immature Granulocytes 0.10 (*)    All other components within normal limits  RESP PANEL BY RT-PCR (FLU A&B, COVID) ARPGX2  CULTURE, BLOOD (ROUTINE X 2)  CULTURE, BLOOD (ROUTINE X 2)  URINE CULTURE  LACTIC ACID, PLASMA  PREGNANCY, URINE  CBC WITH DIFFERENTIAL/PLATELET  LACTIC ACID, PLASMA      EKG  EKG Interpretation  Date/Time:    Ventricular Rate:    PR Interval:    QRS Duration:   QT Interval:    QTC Calculation:   R Axis:     Text Interpretation:           Imaging Studies ordered: I ordered imaging studies including CT abdomen pelvis with contrast, 2 view chest x-ray I independently visualized and interpreted imaging. I agree with the radiologist interpretation   Medicines ordered and prescription drug management: Meds ordered this encounter  Medications   acetaminophen (TYLENOL) tablet 650 mg   lactated ringers bolus 1,000 mL   ibuprofen (ADVIL) tablet 600 mg   cefTRIAXone (ROCEPHIN) 2 g in sodium chloride 0.9 % 100 mL IVPB    Order Specific Question:   Antibiotic Indication:    Answer:   Other Indication (list below)    Order Specific Question:   Other Indication:    Answer:   pyelonephritis   iohexol (OMNIPAQUE) 300 MG/ML solution 100 mL    -I have reviewed the patients home medicines and have made adjustments as needed  Critical interventions IV fluids, dose of Rocephin  Cardiac Monitoring: The  patient was maintained on a cardiac monitor.  I personally viewed and interpreted the cardiac monitored which showed an underlying rhythm of: Initially sinus tachycardia, improved to normal sinus rhythm  Reevaluation: After the interventions noted above, I reevaluated the patient and found that they have :improved  Co morbidities that complicate the patient evaluation  Past Medical History:  Diagnosis Date   Acute pyelonephritis 09/27/2015   Chronic migraine    neurologist-  dr Jaynee Eagles   Depression 05/30/2017   with emotional eating   Diabetes mellitus without complication ([place])    DM type 2   Dysuria    GERD (gastroesophageal reflux disease)    History of acute pyelonephritis    09-27-2015:   11-04-2017  w/ sepsis due to obstruction from kidney stone   History of COVID-19 07/02/2020   mild symptoms x few days all symptoms resolved   History of kidney stones    IC (interstitial cystitis)    Left ureteral stone    OAB (overactive bladder)    Urge urinary incontinence    Vesicoureteral reflux       Dispostion: Patient is appropriate for discharge.  Discharged in stable condition.  Return precautions discussed.  Shared decision making had regarding admission versus discharge.  Patient feels comfortable with discharge with strict return precautions.  Patient's vital signs significantly improved.  She is well-appearing.  Without acute distress.  Feel this is reasonable.  Final Clinical Impression(s) / ED Diagnoses Final diagnoses:  Acute pyelonephritis    Rx / DC Orders ED  Discharge Orders          Ordered    ciprofloxacin (CIPRO) 500 MG tablet  Every 12 hours        03/16/22 2042    ondansetron (ZOFRAN-ODT) 4 MG disintegrating tablet  Every 8 hours PRN        03/16/22 2042                Evlyn Courier, PA-C 03/16/22 2043    Ezequiel Essex, MD 03/17/22 [phone]

## 2022-03-16 NOTE — ED Notes (Signed)
Patient transported to X-ray 

## 2022-03-16 NOTE — ED Notes (Signed)
Patient transported to CT 

## 2022-03-16 NOTE — ED Notes (Signed)
Discharge instructions, follow up care, and prescription reviewed and explained, pt verbalized understanding. Pt caox4 and ambulatory on d/c.  

## 2022-03-16 NOTE — ED Notes (Signed)
Ambulated to and from restroom, steady gait denies any sob or lightheadedness

## 2022-03-16 NOTE — Discharge Instructions (Addendum)
Your work-up today showed you have pyelonephritis.  Work-up otherwise overall reassuring.  You received a dose of antibiotic in the emergency room.  I have sent additional antibiotics into the pharmacy for you along with nausea medication.  For any concerning symptoms such as fever, inability tolerate p.o. intake, severe pain please return to the emergency room for evaluation.  Otherwise please follow-up with your primary care provider either at the end of this week or beginning of next week.

## 2022-03-16 NOTE — ED Triage Notes (Signed)
Patient here  POV from Home.  Endorses Fever that began at 1300 today. Associated with Chills and Fatigue.   103.6 Temperature Shortly PTA. No Sore Throat. No Cough.   NAD Noted during Triage. A&Ox4. GCS 15. Ambulatory.

## 2022-03-17 ENCOUNTER — Encounter (HOSPITAL_COMMUNITY): Payer: Self-pay | Admitting: Emergency Medicine

## 2022-03-17 ENCOUNTER — Emergency Department (HOSPITAL_COMMUNITY)
Admission: EM | Admit: 2022-03-17 | Discharge: 2022-03-17 | Disposition: A | Discharge status: Home / Self Care | Payer: Medicaid Other | Attending: Emergency Medicine | Admitting: Emergency Medicine

## 2022-03-17 ENCOUNTER — Emergency Department (HOSPITAL_COMMUNITY): Payer: Medicaid Other

## 2022-03-17 DIAGNOSIS — R000 Tachycardia, unspecified: Secondary | ICD-10-CM | POA: Diagnosis not present

## 2022-03-17 DIAGNOSIS — R509 Fever, unspecified: Secondary | ICD-10-CM | POA: Diagnosis not present

## 2022-03-17 DIAGNOSIS — Z7984 Long term (current) use of oral hypoglycemic drugs: Secondary | ICD-10-CM | POA: Insufficient documentation

## 2022-03-17 DIAGNOSIS — E119 Type 2 diabetes mellitus without complications: Secondary | ICD-10-CM | POA: Diagnosis not present

## 2022-03-17 DIAGNOSIS — N12 Tubulo-interstitial nephritis, not specified as acute or chronic: Secondary | ICD-10-CM | POA: Diagnosis not present

## 2022-03-17 DIAGNOSIS — N10 Acute tubulo-interstitial nephritis: Secondary | ICD-10-CM

## 2022-03-17 DIAGNOSIS — J029 Acute pharyngitis, unspecified: Secondary | ICD-10-CM | POA: Insufficient documentation

## 2022-03-17 DIAGNOSIS — D72829 Elevated white blood cell count, unspecified: Secondary | ICD-10-CM | POA: Insufficient documentation

## 2022-03-17 DIAGNOSIS — R110 Nausea: Secondary | ICD-10-CM | POA: Insufficient documentation

## 2022-03-17 LAB — CBC WITH DIFFERENTIAL/PLATELET
Abs Immature Granulocytes: 0.11 10*3/uL — ABNORMAL HIGH (ref 0.00–0.07)
Basophils Absolute: 0.1 10*3/uL (ref 0.0–0.1)
Basophils Relative: 0 %
Eosinophils Absolute: 0.1 10*3/uL (ref 0.0–0.5)
Eosinophils Relative: 1 %
HCT: 36.5 % (ref 36.0–46.0)
Hemoglobin: 11.4 g/dL — ABNORMAL LOW (ref 12.0–15.0)
Immature Granulocytes: 1 %
Lymphocytes Relative: 6 %
Lymphs Abs: 0.9 10*3/uL (ref 0.7–4.0)
MCH: 24.9 pg — ABNORMAL LOW (ref 26.0–34.0)
MCHC: 31.2 g/dL (ref 30.0–36.0)
MCV: 79.7 fL — ABNORMAL LOW (ref 80.0–100.0)
Monocytes Absolute: 1.1 10*3/uL — ABNORMAL HIGH (ref 0.1–1.0)
Monocytes Relative: 7 %
Neutro Abs: 14 10*3/uL — ABNORMAL HIGH (ref 1.7–7.7)
Neutrophils Relative %: 85 %
Platelets: 328 10*3/uL (ref 150–400)
RBC: 4.58 MIL/uL (ref 3.87–5.11)
RDW: 14.6 % (ref 11.5–15.5)
WBC: 16.3 10*3/uL — ABNORMAL HIGH (ref 4.0–10.5)
nRBC: 0 % (ref 0.0–0.2)

## 2022-03-17 LAB — LACTIC ACID, PLASMA: Lactic Acid, Venous: 1.6 mmol/L (ref 0.5–1.9)

## 2022-03-17 MED ORDER — ONDANSETRON 8 MG PO TBDP: 8.0000 mg | ORAL_TABLET | Freq: Once | ORAL | Status: DC

## 2022-03-17 MED ORDER — ACETAMINOPHEN 325 MG PO TABS
650.0000 mg | ORAL_TABLET | Freq: Once | ORAL | Status: AC
  Administered 2022-03-17: 650 mg via ORAL
  Filled 2022-03-17: qty 2

## 2022-03-17 MED ORDER — LACTATED RINGERS IV BOLUS
1000.0000 mL | Freq: Once | INTRAVENOUS | Status: AC
  Administered 2022-03-17: 1000 mL via INTRAVENOUS

## 2022-03-17 MED ORDER — ONDANSETRON HCL 4 MG/2ML IJ SOLN
4.0000 mg | Freq: Once | INTRAMUSCULAR | Status: AC
  Administered 2022-03-17: 4 mg via INTRAVENOUS
  Filled 2022-03-17: qty 2

## 2022-03-17 NOTE — ED Provider Notes (Signed)
[place] DEPT Provider Note   CSN: [id number] Arrival date & time: 03/17/22  0202     History  Chief Complaint  Patient presents with   Fever    Lauren Cardenas is a 36 y.o. female.  The history is provided by the patient.  Fever She has history diabetes, hyperlipidemia, pyelonephritis and comes in for worsening fever.  She was seen at [facility] where she was diagnosed with pyelonephritis and discharged earlier tonight.  At home, temperature spiked to 105.5.  She took a dose of ibuprofen 600 mg, but temperature only came down to 104.  She used cool compresses, but temperature went back up to 105.5.  She endorses chills and nausea but no vomiting.   Home Medications Prior to Admission medications   Medication Sig Start Date End Date Taking? Authorizing Provider  acetaminophen (TYLENOL) 500 MG tablet Take 1 tablet (500 mg total) by mouth every 6 (six) hours as needed. Patient taking differently: Take 500 mg by mouth every 6 (six) hours as needed for mild pain. 01/18/21   Ivy Lynn, NP  blood glucose meter kit and supplies Dispense based on patient and insurance preference. Use up to four times daily as directed. (FOR ICD-10 E10.9, E11.9). 10/12/21   Janora Norlander, DO  cetirizine (ZYRTEC) 10 MG chewable tablet Chew 10 mg by mouth daily.    [provider]  Cholecalciferol (VITAMIN D-3) 25 MCG (1000 UT) CAPS Take 1,000 Units by mouth daily with lunch.    [provider]  ciprofloxacin (CIPRO) 500 MG tablet Take 1 tablet (500 mg total) by mouth every 12 (twelve) hours for 7 days. 03/16/22 03/23/22  Evlyn Courier, PA-C  famotidine (PEPCID) 20 MG tablet Take 1 tablet (20 mg total) by mouth daily. For acid reflux Patient taking differently: Take 20 mg by mouth in the morning and at bedtime. 10/07/21   Janora Norlander, DO  fluticasone (FLONASE) 50 MCG/ACT nasal spray Place 2 sprays into both nostrils daily. Patient taking  differently: Place 2 sprays into both nostrils daily as needed for allergies or rhinitis. 06/25/21   Baruch Gouty, FNP  fluticasone (FLOVENT HFA) 44 MCG/ACT inhaler Inhale 1 puff into the lungs 2 (two) times daily. 10/07/21   Ronnie Doss M, DO  glucose blood test strip Use as instructed once daily as directed for sugar monitoring E11.9 10/12/21   Janora Norlander, DO  hydrOXYzine (ATARAX) 10 MG tablet Take 10 mg by mouth at bedtime as needed ("for urinary frequency").    [provider]  Lancets MISC UAD to check BGs once daily as directed E11.9 10/12/21   Ronnie Doss M, DO  metFORMIN (GLUCOPHAGE) 500 MG tablet Take 500 mg by mouth at bedtime.    [provider]  Meth-Hyo-M Bl-Na Phos-Ph Sal (URIBEL) 118 MG CAPS Take 1 capsule by mouth daily as needed (for urinary pain/discomfort). 12/17/18   [provider]  OMEGA-3 FATTY ACIDS PO Take 1,000 mg by mouth daily.    [provider]  ondansetron (ZOFRAN-ODT) 4 MG disintegrating tablet Take 1 tablet (4 mg total) by mouth every 8 (eight) hours as needed for nausea or vomiting. 03/16/22   Evlyn Courier, PA-C  oxybutynin (DITROPAN) 5 MG tablet Take 5 mg by mouth 4 (four) times daily as needed for bladder spasms.    [provider]  PROAIR HFA 108 [phone] Base) MCG/ACT inhaler Inhale 2 puffs into the lungs every 6 (six) hours as needed for wheezing  or shortness of breath. 12/24/20   Janora Norlander, DO  promethazine (PHENERGAN) 25 MG tablet Take 1 tablet (25 mg total) by mouth every 6 (six) hours as needed for nausea or vomiting. 12/02/21   Debbe Odea, MD  propranolol ER (INDERAL LA) 120 MG 24 hr capsule Take 1 capsule (120 mg total) by mouth at bedtime. 10/07/21   Janora Norlander, DO  Rimegepant Sulfate (NURTEC) 75 MG TBDP Take 1 tablet by mouth daily as needed. Patient taking differently: Take 75 mg by mouth daily as needed (for migraines). 10/05/20   Janora Norlander, DO  rizatriptan (MAXALT-MLT)  10 MG disintegrating tablet Take 1 tablet (10 mg total) by mouth 3 (three) times daily as needed for migraine. Patient taking differently: Take 10 mg by mouth 3 (three) times daily as needed for migraine (dissolve orally). 09/06/18   Baruch Gouty, FNP  rosuvastatin (CRESTOR) 5 MG tablet Take 1 tablet (5 mg total) by mouth daily. Patient taking differently: Take 5 mg by mouth at bedtime. 10/12/21   Janora Norlander, DO  Semaglutide, 1 MG/DOSE, 4 MG/3ML SOPN Inject 1 mg as directed once a week. **dose change 01/14/22   Ronnie Doss M, DO  sertraline (ZOLOFT) 100 MG tablet Take 1 tablet (100 mg total) by mouth daily. 10/07/21   Janora Norlander, DO      Allergies    Amoxicillin and Sudafed [pseudoephedrine hcl]    Review of Systems   Review of Systems  Constitutional:  Positive for fever.  All other systems reviewed and are negative.   Physical Exam Updated Vital Signs BP (!) 128/93   Pulse (!) 120   Temp (!) 103.2 F (39.6 C) (Oral)   Resp 20   Ht '5\' 1"'  (1.549 m)   Wt 88.5 kg   LMP  (LMP Unknown)   SpO2 97%   BMI 36.84 kg/m  Physical Exam Vitals and nursing note reviewed.   36 year old female, resting comfortably and in no acute distress. Vital signs are significant for elevated heart rate, temperature, diastolic blood pressure. Oxygen saturation is 97%, which is normal.  In spite of fever, patient is nontoxic in appearance. Head is normocephalic and atraumatic. PERRLA, EOMI. Oropharynx is clear. Neck is nontender and supple without adenopathy or JVD. Back is nontender and there is no CVA tenderness. Lungs are clear without rales, wheezes, or rhonchi. Chest is nontender. Heart has regular rate and rhythm without murmur. Abdomen is soft, flat, nontender. Extremities have no cyanosis or edema, full range of motion is present. Skin is warm and dry without rash. Neurologic: Mental status is normal, cranial nerves are intact, moves all extremities equally.  ED Results /  Procedures / Treatments   Labs (all labs ordered are listed, but only abnormal results are displayed) Labs Reviewed  CBC WITH DIFFERENTIAL/PLATELET - Abnormal; Notable for the following components:      Result Value   WBC 16.3 (*)    Hemoglobin 11.4 (*)    MCV 79.7 (*)    MCH 24.9 (*)    Neutro Abs 14.0 (*)    Monocytes Absolute 1.1 (*)    Abs Immature Granulocytes 0.11 (*)    All other components within normal limits  LACTIC ACID, PLASMA   Radiology DG Chest 2 View  Result Date: 03/17/2022 CLINICAL DATA:  Fever for several days EXAM: CHEST - 2 VIEW COMPARISON:  03/16/2022 FINDINGS: The heart size and mediastinal contours are within normal limits. Both lungs are clear. The  visualized skeletal structures are unremarkable. IMPRESSION: No active cardiopulmonary disease. Electronically Signed   By: Inez Catalina M.D.   On: 03/17/2022 02:44   CT ABDOMEN PELVIS W CONTRAST  Result Date: 03/16/2022 CLINICAL DATA:  Fever, chills and fatigue. EXAM: CT ABDOMEN AND PELVIS WITH CONTRAST TECHNIQUE: Multidetector CT imaging of the abdomen and pelvis was performed using the standard protocol following bolus administration of intravenous contrast. RADIATION DOSE REDUCTION: This exam was performed according to the departmental dose-optimization program which includes automated exposure control, adjustment of the mA and/or kV according to patient size and/or use of iterative reconstruction technique. CONTRAST:  33m OMNIPAQUE IOHEXOL 300 MG/ML  SOLN COMPARISON:  November 29, 2021 FINDINGS: Lower chest: No acute abnormality. Hepatobiliary: No focal liver abnormality is seen. Status post cholecystectomy. No biliary dilatation. Pancreas: Unremarkable. No pancreatic ductal dilatation or surrounding inflammatory changes. Spleen: Normal in size without focal abnormality. Adrenals/Urinary Tract: Adrenal glands are unremarkable. Kidneys are normal in size with areas of focal cortical scarring seen within the posterior aspect  of the upper pole of the left kidney and posteromedial aspects of the mid right kidney. A 1.0 cm diameter focus of parenchymal low attenuation (approximately 74.43 Hounsfield units) is seen within the upper pole of the left kidney. Mild left-sided perinephric inflammatory fat stranding is seen. 1 mm and 2 mm bilateral nonobstructing renal calculi are seen. Bladder is unremarkable. Stomach/Bowel: Stomach is within normal limits. Appendix appears normal. No evidence of bowel wall thickening, distention, or inflammatory changes. Vascular/Lymphatic: No significant vascular findings are present. Multiple subcentimeter para-aortic lymph nodes are seen in between the abdominal aorta and left kidney. Reproductive: Uterus and bilateral adnexa are unremarkable. Other: No abdominal wall hernia or abnormality. No abdominopelvic ascites. Musculoskeletal: No acute or significant osseous findings. IMPRESSION: 1. Mild left-sided perinephric inflammatory fat stranding which may represent sequelae associated with an acute pyelonephritis. Correlation with urinalysis is recommended. 2. Bilateral nonobstructing renal calculi. 3. Findings which may represent a small left renal cyst. Correlation with renal ultrasound is recommended. This recommendation follows ACR consensus guidelines: Management of the Incidental Renal Mass on CT: A White Paper of the ACR Incidental Findings Committee. J Am Coll Radiol [id number] 4. Evidence of prior cholecystectomy. Electronically Signed   By: TVirgina NorfolkM.D.   On: 03/16/2022 20:15   DG Chest 2 View  Result Date: 03/16/2022 CLINICAL DATA:  Fever and body aches EXAM: CHEST - 2 VIEW COMPARISON:  None Available. FINDINGS: The heart size and mediastinal contours are within normal limits. Both lungs are clear. The visualized skeletal structures are unremarkable. Surgical clips in the right upper quadrant. IMPRESSION: No active cardiopulmonary disease. Electronically Signed   By: HMarin RobertsM.D.   On: 03/16/2022 16:34    Procedures Procedures    Medications Ordered in ED Medications  acetaminophen (TYLENOL) tablet 650 mg (has no administration in time range)  lactated ringers bolus 1,000 mL (has no administration in time range)  ondansetron (ZOFRAN) injection 4 mg (has no administration in time range)    ED Course/ Medical Decision Making/ A&P                           Medical Decision Making Amount and/or Complexity of Data Reviewed Labs: ordered. Radiology: ordered.  Risk OTC drugs. Prescription drug management.   Fever inpatient with pyelonephritis.  She is nontoxic in appearance although temperature is quite high.  Tachycardia is present and I feel this is mostly related to  fever.  I have ordered oral acetaminophen as well as IV fluids and IV ondansetron.  She just had sepsis work-up done within the past 12 hours, cultures do not need to be repeated.  I have ordered CBC, basic metabolic panel, lactic acid level.  Chest x-ray had been ordered at triage, and shows no acute cardiopulmonary process.  I have independently viewed the images, and agree with radiologist interpretation.  I have reviewed her old records, and ED visit earlier tonight led to diagnosis of pyelonephritis based on bacteria in the urine and CT scan showing changes suggestive of left-sided pyelonephritis.  She had a dose of ceftriaxone in the emergency department, no indication for additional antibiotics.  I have reviewed and interpreted her laboratory tests and my interpretation is leukocytosis which is falling, normal lactic acid level indicating no evidence of sepsis.  Temperatures come down dramatically with acetaminophen.  No indication for hospital admission at this time as fever is controlled, and no vomiting.  She is to continue the antibiotic which was prescribed earlier, return if fever persist beyond the first 2 days of antibiotic administration.  Final Clinical Impression(s) / ED  Diagnoses Final diagnoses:  Fever in adult  Acute pyelonephritis    Rx / DC Orders ED Discharge Orders     None         Delora Fuel, MD 97/53/00 [phone]

## 2022-03-17 NOTE — ED Triage Notes (Addendum)
Seen at [facility] yesterday for fever. Work up was negative. Now has fever of 103. Took ibuprofen 600mg  at 1240am. Hx of sepsis from kidney stones/gallbladder.

## 2022-03-17 NOTE — ED Notes (Signed)
Called lab to follow up on blood work as several of the orders had been modified and/or canceled and were not showing as being in process. Lab tech advised they will run them now.

## 2022-03-17 NOTE — Discharge Instructions (Signed)
Drink plenty of fluids.  Take ibuprofen and/or acetaminophen as needed for fever or pain.  Please be aware that these medications work in completely different ways, and are more effective when taken together than either medication is by itself.  If your cultures show that you need to be on a different antibiotic, we will contact you.  Cultures should be ready in about 2 days.  If you are still running significant fevers in 2 days, return for reevaluation.

## 2022-03-17 NOTE — ED Notes (Signed)
ED Provider at bedside. 

## 2022-03-19 LAB — URINE CULTURE: Culture: >=100000 — AB

## 2022-03-20 ENCOUNTER — Telehealth (HOSPITAL_BASED_OUTPATIENT_CLINIC_OR_DEPARTMENT_OTHER): Payer: Self-pay | Admitting: *Deleted

## 2022-03-20 NOTE — Telephone Encounter (Signed)
Post ED Visit - Positive Culture Follow-up  Culture report reviewed by antimicrobial stewardship pharmacist: [place] Team []  Elenor Quinones, Pharm.D. []  Heide Guile, Pharm.D., BCPS AQ-ID []  Parks Neptune, Pharm.D., BCPS []  Alycia Rossetti, Pharm.D., BCPS []  [place], [place].D., BCPS, AAHIVP []  Legrand Como, Pharm.D., BCPS, AAHIVP []  Salome Arnt, PharmD, BCPS []  Johnnette Gourd, PharmD, BCPS []  Hughes Better, PharmD, BCPS []  Leeroy Cha, PharmD []  Laqueta Linden, PharmD, BCPS [x]  Franchot Gallo, PharmD  [place] Team []  Leodis Sias, PharmD []  Lindell Spar, PharmD []  Royetta Asal, PharmD []  Graylin Shiver, Rph []  Rema Fendt) Glennon Mac, PharmD []  Arlyn Dunning, PharmD []  Netta Cedars, PharmD []  Dia Sitter, PharmD []  Leone Haven, PharmD []  Gretta Arab, PharmD []  Theodis Shove, PharmD []  Peggyann Juba, PharmD []  Reuel Boom, PharmD   Positive urine culture Treated with Ciprofloxacin, organism sensitive to the same and no further patient follow-up is required at this time.  Rosie Fate 03/20/2022, 1:00 PM

## 2022-03-22 LAB — CULTURE, BLOOD (ROUTINE X 2)
Culture: NO GROWTH
Culture: NO GROWTH
Special Requests: ADEQUATE
Special Requests: ADEQUATE

## 2022-03-24 DIAGNOSIS — N10 Acute tubulo-interstitial nephritis: Secondary | ICD-10-CM | POA: Diagnosis not present

## 2022-03-24 DIAGNOSIS — Z87442 Personal history of urinary calculi: Secondary | ICD-10-CM | POA: Diagnosis not present

## 2022-04-22 ENCOUNTER — Encounter: Payer: Self-pay | Admitting: Family Medicine

## 2022-04-22 ENCOUNTER — Ambulatory Visit: Payer: Medicaid Other | Admitting: Family Medicine

## 2022-04-22 VITALS — BP 130/80 | HR 102 | Temp 98.0°F | Ht 61.0 in | Wt 190.2 lb

## 2022-04-22 DIAGNOSIS — Z23 Encounter for immunization: Secondary | ICD-10-CM

## 2022-04-22 DIAGNOSIS — J302 Other seasonal allergic rhinitis: Secondary | ICD-10-CM

## 2022-04-22 DIAGNOSIS — E785 Hyperlipidemia, unspecified: Secondary | ICD-10-CM

## 2022-04-22 DIAGNOSIS — E1169 Type 2 diabetes mellitus with other specified complication: Secondary | ICD-10-CM

## 2022-04-22 DIAGNOSIS — G43009 Migraine without aura, not intractable, without status migrainosus: Secondary | ICD-10-CM | POA: Diagnosis not present

## 2022-04-22 DIAGNOSIS — T7840XD Allergy, unspecified, subsequent encounter: Secondary | ICD-10-CM

## 2022-04-22 LAB — BAYER DCA HB A1C WAIVED: HB A1C (BAYER DCA - WAIVED): 6.5 % — ABNORMAL HIGH (ref 4.8–5.6)

## 2022-04-22 MED ORDER — CETIRIZINE HCL 10 MG PO CHEW: 10.0000 mg | CHEWABLE_TABLET | Freq: Every day | ORAL | 3 refills | Status: AC

## 2022-04-22 MED ORDER — PROPRANOLOL HCL ER 120 MG PO CP24: 120.0000 mg | ORAL_CAPSULE | Freq: Every day | ORAL | 3 refills | Status: DC

## 2022-04-22 MED ORDER — ROSUVASTATIN CALCIUM 5 MG PO TABS: 5.0000 mg | ORAL_TABLET | Freq: Every day | ORAL | 3 refills | Status: DC

## 2022-04-22 MED ORDER — SEMAGLUTIDE (1 MG/DOSE) 4 MG/3ML ~~LOC~~ SOPN: 1.0000 mg | PEN_INJECTOR | SUBCUTANEOUS | 1 refills | Status: DC

## 2022-04-22 NOTE — Progress Notes (Signed)
Subjective: CC:DM PCP: Janora Norlander, DO NID:[id number] Lauren Cardenas is a 36 y.o. female presenting to clinic today for:  1. Type 2 Diabetes with hyperlipidemia:  Patient has been compliant with her metformin as prescribed by her specialist and the [place] which she is now on 1 mg subcutaneously weekly.  She is tolerating this without difficulty.  She occasionally does have nausea and has to use her Zofran for breakthrough nausea.  She tries to avoid foods and activities that exacerbate that however  Last eye exam: Up-to-date Last foot exam: Up-to-date   Last A1c:  Lab Results  Component Value Date   HGBA1C 7.0 (H) 01/14/2022   Nephropathy screen indicated?:  Up-to-date Last flu, zoster and/or pneumovax:  Immunization History  Administered Date(s) Administered   DTaP 03/15/1989, 03/12/1990   Hepatitis B 04/04/1996, 05/13/1996, 10/15/1996   IPV 03/15/1989, 03/12/1990   Influenza Split 06/14/2012   Influenza,inj,Quad PF,6+ Mos 08/17/2017, 04/04/2018, 05/06/2019, 04/22/2022   MMR 03/12/1990   PNEUMOCOCCAL CONJUGATE-20 12/01/2021   Tdap 06/16/2012    ROS: Denies dizziness, LOC, polyuria, polydipsia, unintended weight loss/gain, foot ulcerations, numbness or tingling in extremities, shortness of breath or chest pain.  Now being treated with a probiotic for her bladder by her urologist    ROS: Per HPI  Allergies  Allergen Reactions   Amoxicillin Hives and Other (See Comments)    Got ceftriaxone in the past Has patient had a PCN reaction causing immediate rash, facial/tongue/throat swelling, SOB or lightheadedness with hypotension: Yes Has patient had a PCN reaction causing severe rash involving mucus membranes or skin necrosis: No Has patient had a PCN reaction that required hospitalization: No Has patient had a PCN reaction occurring within the last 10 years: Yes If all of the above answers are "NO", then may proceed with Cephalosporin use.   Sudafed [Pseudoephedrine Hcl]  Shortness Of Breath   Past Medical History:  Diagnosis Date   Acute pyelonephritis 09/27/2015   Chronic migraine    neurologist-  dr Jaynee Eagles   Depression 05/30/2017   with emotional eating   Diabetes mellitus without complication ([place])    DM type 2   Dysuria    GERD (gastroesophageal reflux disease)    History of acute pyelonephritis    09-27-2015:   11-04-2017  w/ sepsis due to obstruction from kidney stone   History of COVID-19 07/02/2020   mild symptoms x few days all symptoms resolved   History of kidney stones    IC (interstitial cystitis)    Left ureteral stone    OAB (overactive bladder)    Urge urinary incontinence    Vesicoureteral reflux     Current Outpatient Medications:    acetaminophen (TYLENOL) 500 MG tablet, Take 1 tablet (500 mg total) by mouth every 6 (six) hours as needed. (Patient taking differently: Take 500 mg by mouth every 6 (six) hours as needed for mild pain.), Disp: 30 tablet, Rfl: 0   blood glucose meter kit and supplies, Dispense based on patient and insurance preference. Use up to four times daily as directed. (FOR ICD-10 E10.9, E11.9)., Disp: 1 each, Rfl: 0   cetirizine (ZYRTEC) 10 MG chewable tablet, Chew 10 mg by mouth daily., Disp: , Rfl:    Cholecalciferol (VITAMIN Lauren-3) 25 MCG (1000 UT) CAPS, Take 1,000 Units by mouth daily with lunch., Disp: , Rfl:    famotidine (PEPCID) 20 MG tablet, Take 1 tablet (20 mg total) by mouth daily. For acid reflux (Patient taking differently: Take 20 mg by mouth  in the morning and at bedtime.), Disp: 100 tablet, Rfl: 3   fluticasone (FLONASE) 50 MCG/ACT nasal spray, Place 2 sprays into both nostrils daily. (Patient taking differently: Place 2 sprays into both nostrils daily as needed for allergies or rhinitis.), Disp: 16 g, Rfl: 6   fluticasone (FLOVENT HFA) 44 MCG/ACT inhaler, Inhale 1 puff into the lungs 2 (two) times daily., Disp: 31.8 each, Rfl: 3   glucose blood test strip, Use as instructed once daily as directed for  sugar monitoring E11.9, Disp: 100 each, Rfl: 3   hydrOXYzine (ATARAX) 10 MG tablet, Take 10 mg by mouth at bedtime as needed ("for urinary frequency")., Disp: , Rfl:    Lancets MISC, UAD to check BGs once daily as directed E11.9, Disp: 100 each, Rfl: 3   metFORMIN (GLUCOPHAGE) 500 MG tablet, Take 500 mg by mouth at bedtime., Disp: , Rfl:    Meth-Hyo-M Bl-Na Phos-Ph Sal (URIBEL) 118 MG CAPS, Take 1 capsule by mouth daily as needed (for urinary pain/discomfort)., Disp: , Rfl:    OMEGA-3 FATTY ACIDS PO, Take 1,000 mg by mouth daily., Disp: , Rfl:    ondansetron (ZOFRAN-ODT) 4 MG disintegrating tablet, Take 1 tablet (4 mg total) by mouth every 8 (eight) hours as needed for nausea or vomiting., Disp: 20 tablet, Rfl: 0   oxybutynin (DITROPAN) 5 MG tablet, Take 5 mg by mouth 4 (four) times daily as needed for bladder spasms., Disp: , Rfl:    PROAIR HFA 108 (90 Base) MCG/ACT inhaler, Inhale 2 puffs into the lungs every 6 (six) hours as needed for wheezing or shortness of breath., Disp: 8 g, Rfl: 3   promethazine (PHENERGAN) 25 MG tablet, Take 1 tablet (25 mg total) by mouth every 6 (six) hours as needed for nausea or vomiting., Disp: 30 tablet, Rfl: 0   propranolol ER (INDERAL LA) 120 MG 24 hr capsule, Take 1 capsule (120 mg total) by mouth at bedtime., Disp: 100 capsule, Rfl: 3   Rimegepant Sulfate (NURTEC) 75 MG TBDP, Take 1 tablet by mouth daily as needed. (Patient taking differently: Take 75 mg by mouth daily as needed (for migraines).), Disp: 2 tablet, Rfl: 0   rizatriptan (MAXALT-MLT) 10 MG disintegrating tablet, Take 1 tablet (10 mg total) by mouth 3 (three) times daily as needed for migraine. (Patient taking differently: Take 10 mg by mouth 3 (three) times daily as needed for migraine (dissolve orally).), Disp: 9 tablet, Rfl: 11   rosuvastatin (CRESTOR) 5 MG tablet, Take 1 tablet (5 mg total) by mouth daily. (Patient taking differently: Take 5 mg by mouth at bedtime.), Disp: 90 tablet, Rfl: 3    Semaglutide, 1 MG/DOSE, 4 MG/3ML SOPN, Inject 1 mg as directed once a week. **dose change, Disp: 9 mL, Rfl: 1   sertraline (ZOLOFT) 100 MG tablet, Take 1 tablet (100 mg total) by mouth daily., Disp: 100 tablet, Rfl: 3 Social History   Socioeconomic History   Marital status: Married    Spouse name: Broadus John   Number of children: 1   Years of education: 12   Highest education level: Not on file  Occupational History   Occupation: [employer]  Tobacco Use   Smoking status: Never   Smokeless tobacco: Never  Vaping Use   Vaping Use: Never used  Substance and Sexual Activity   Alcohol use: No   Drug use: No   Sexual activity: Yes    Birth control/protection: None  Other Topics Concern   Not on file  Social  History Narrative   Lives with husband and daughter and in-laws   Daughter, Summer, with nonverbal Autism   Her spouse (who works from home) is very supportive   Caffeine use: 100-174m/week   She drinks about 3 cups of diet caffeine soda per week   She has been going to [employer]  Social Determinants of Health   Financial Resource Strain: Not on file  Food Insecurity: Not on file  Transportation Needs: Not on file  Physical Activity: Not on file  Stress: Not on file  Social Connections: Not on file  Intimate Partner Violence: Not on file   Family History  Problem Relation Age of Onset   Hypertension Mother    Obesity Mother    Diabetes Father    Kidney disease Father    Sudden death Father     Objective: Office vital signs reviewed. BP 130/80   Pulse (!) 102   Temp 98 F (36.7 C) (Temporal)   Ht _0  (1.549 m)   Wt 190 lb 3.2 oz (86.3 kg)   LMP 04/09/2022   SpO2 96%   BMI 35.94 kg/m   Physical Examination:  General: Awake, alert, cushingoid appearance, No acute distress HEENT: sclera white, MMM Cardio: regular rate and rhythm, S1S2 heard, no murmurs appreciated Pulm: clear to auscultation bilaterally, no wheezes,  rhonchi or rales; normal work of breathing on room air    Assessment/ Plan: 36y.o. female   Controlled type 2 diabetes mellitus with other specified complication, without long-term current use of insulin ([place] - Plan: Bayer DCA Hb A1c Waived, Semaglutide, 1 MG/DOSE, 4 MG/3ML SOPN  Hyperlipidemia associated with type 2 diabetes mellitus ([place] - Plan: Semaglutide, 1 MG/DOSE, 4 MG/3ML SOPN, rosuvastatin (CRESTOR) 5 MG tablet  Need for immunization against influenza - Plan: Flu Vaccine QUAD 6244moM (Fluarix, Fluzone & Alfiuria Quad PF)  Morbid obesity ([place]- Plan: Semaglutide, 1 MG/DOSE, 4 MG/3ML SOPN  Migraine without aura and without status migrainosus, not intractable - Plan: propranolol ER (INDERAL LA) 120 MG 24 hr capsule  Allergy, subsequent encounter - Plan: cetirizine (ZYRTEC) 10 MG chewable tablet  Diabetes now well controlled with A1c dropping to 6.5.  Continue Ozempic 1 mg every week.  Continue metformin as prescribed by her specialist  Not yet due for fasting lipid.  Continue statin  Influenza vaccination administered  Migraines are improving with weight loss.  Continue propranolol for prevention  Zyrtec renewed per her request  Orders Placed This Encounter  Procedures   Flu Vaccine QUAD 44m60mo (Fluarix, Fluzone & Alfiuria Quad PF)   Bayer DCA Hb A1c Waived   No orders of the defined types were placed in this encounter.    AshJanora NorlanderO [phone]

## 2022-05-05 DIAGNOSIS — N3020 Other chronic cystitis without hematuria: Secondary | ICD-10-CM | POA: Diagnosis not present

## 2022-06-23 ENCOUNTER — Telehealth: Payer: Self-pay

## 2022-06-23 NOTE — Telephone Encounter (Signed)
..   Medicaid Managed Care Note  06/23/2022 Name: Lauren Cardenas MRN: [id number] DOB: [birth year]  Lauren Cardenas is a 36 y.o. year old female who is a primary care patient of Raliegh Ip, DO and is actively engaged with the care management team. I reached out to Lauren Cardenas by phone today to assist with scheduling an initial visit with the RN Case Manager  Follow up plan: Unsuccessful telephone outreach attempt made. A HIPAA compliant phone message was left for the patient providing contact information and requesting a return call.  The care management team will reach out to the patient again over the next 7-14 days.   Weston Settle Care Guide, High Risk Medicaid Managed Care Embedded Care Coordination [facility]  [facility]

## 2022-06-30 ENCOUNTER — Telehealth: Payer: Self-pay

## 2022-06-30 NOTE — Telephone Encounter (Signed)
.   Medicaid Managed Care Note  06/30/2022 Name: Lauren Cardenas MRN: [id number] DOB: 15-Sep-1985  Lauren Cardenas is a 37 y.o. year old female who is a primary care patient of Janora Norlander, DO and is actively engaged with the care management team. I reached out to Lauren Cardenas by phone today to assist with scheduling an initial visit with the RN Case Manager. No one answered and I was not able to leave a message.  Follow up plan: The care management team will reach out to the patient again over the next 7 days.   [place]

## 2022-07-05 DIAGNOSIS — N10 Acute tubulo-interstitial nephritis: Secondary | ICD-10-CM | POA: Diagnosis not present

## 2022-07-05 DIAGNOSIS — N200 Calculus of kidney: Secondary | ICD-10-CM | POA: Diagnosis not present

## 2022-07-05 DIAGNOSIS — Z9049 Acquired absence of other specified parts of digestive tract: Secondary | ICD-10-CM | POA: Diagnosis not present

## 2022-07-05 DIAGNOSIS — N3020 Other chronic cystitis without hematuria: Secondary | ICD-10-CM | POA: Diagnosis not present

## 2022-07-05 DIAGNOSIS — R1084 Generalized abdominal pain: Secondary | ICD-10-CM | POA: Diagnosis not present

## 2022-07-05 DIAGNOSIS — R8271 Bacteriuria: Secondary | ICD-10-CM | POA: Diagnosis not present

## 2022-07-19 ENCOUNTER — Telehealth: Payer: Self-pay

## 2022-07-19 NOTE — Telephone Encounter (Signed)
..  Medicaid Managed Care   Unsuccessful Outreach Note  07/19/2022 Name: Lauren Cardenas MRN: [id number] DOB: 1986/03/10  Referred by: Janora Norlander, DO Reason for referral : Appointment   Third unsuccessful telephone outreach was attempted today. The patient was referred to the case management team for assistance with care management and care coordination. The patient's primary care provider has been notified of our unsuccessful attempts to make or maintain contact with the patient. The care management team is pleased to engage with this patient at any time in the future should he/she be interested in assistance from the care management team.   Follow Up Plan: We have been unable to make contact with the patient for follow up. The care management team is available to follow up with the patient after provider conversation with the patient regarding recommendation for care management engagement and subsequent re-referral to the care management team.   [place], [place]

## 2022-08-11 ENCOUNTER — Encounter: Payer: Self-pay | Admitting: Family Medicine

## 2022-08-11 DIAGNOSIS — F411 Generalized anxiety disorder: Secondary | ICD-10-CM

## 2022-08-11 DIAGNOSIS — F331 Major depressive disorder, recurrent, moderate: Secondary | ICD-10-CM

## 2022-08-11 MED ORDER — METFORMIN HCL 500 MG PO TABS: 500.0000 mg | ORAL_TABLET | Freq: Every day | ORAL | 0 refills | Status: DC

## 2022-08-11 MED ORDER — SERTRALINE HCL 100 MG PO TABS: 100.0000 mg | ORAL_TABLET | Freq: Every day | ORAL | 0 refills | Status: DC

## 2022-08-12 DIAGNOSIS — N3020 Other chronic cystitis without hematuria: Secondary | ICD-10-CM | POA: Diagnosis not present

## 2022-08-12 DIAGNOSIS — N3946 Mixed incontinence: Secondary | ICD-10-CM | POA: Diagnosis not present

## 2022-08-26 ENCOUNTER — Encounter: Payer: Self-pay | Admitting: Nurse Practitioner

## 2022-08-26 ENCOUNTER — Telehealth: Payer: Self-pay | Admitting: Family Medicine

## 2022-08-26 ENCOUNTER — Ambulatory Visit: Payer: Medicaid Other | Admitting: Nurse Practitioner

## 2022-08-26 VITALS — BP 126/74 | HR 78 | Temp 97.9°F | Resp 20 | Ht 61.0 in | Wt 195.0 lb

## 2022-08-26 DIAGNOSIS — R220 Localized swelling, mass and lump, head: Secondary | ICD-10-CM | POA: Diagnosis not present

## 2022-08-26 MED ORDER — PREDNISONE 10 MG (21) PO TBPK: ORAL_TABLET | ORAL | 0 refills | Status: DC

## 2022-08-26 NOTE — Patient Instructions (Signed)
Bell's Palsy, Adult  Bell's palsy is a short-term inability to move muscles in a part of the face. The inability to move, also called paralysis, results from inflammation or compression of the seventh cranial nerve. This nerve travels along the skull and under the ear to the side of the face. This nerve is responsible for facial movements that include blinking, closing the eyes, smiling, and frowning. What are the causes? The exact cause of this condition is not known. It may be caused by an infection from a virus, such as the chickenpox (herpes zoster), Epstein-Barr, or mumps virus. What increases the risk? You are more likely to develop this condition if: You are pregnant. You have diabetes. You have had a recent infection in your nose, throat, or airways. You have a weakened body defense system (immune system). You have had a facial injury, such as a fracture. You have a family history of Bell's palsy. What are the signs or symptoms? Symptoms of this condition include: Weakness on one side of the face. Drooping eyelid and corner of the mouth. Excessive tearing in one eye. Difficulty closing the eyelid. Dry eye. Drooling. Dry mouth. Changes in taste. Change in facial appearance. Pain behind one ear. Ringing in one or both ears. Sensitivity to sound in one ear. Facial twitching. Headache. Impaired speech. Dizziness. Difficulty eating or drinking. Most of the time, only one side of the face is affected. In rare cases, Bell's palsy may affect the whole face. How is this diagnosed? This condition is diagnosed based on: Your symptoms. Your medical history. A physical exam. You may also have to see health care providers who specialize in disorders of the nerves (neurologist) or diseases and conditions of the eye (ophthalmologist). You may have tests, such as: A test to check for nerve damage (electromyogram). Imaging studies, such as a CT scan or an MRI. Blood tests. How is this  treated? This condition affects every person differently. Sometimes symptoms go away without treatment within a couple weeks. If treatment is needed, it varies from person to person. The goal of treatment is to reduce inflammation and protect the eye from damage. Treatment for Bell's palsy may include: Medicines, such as: Steroids to reduce swelling and inflammation. Antiviral medicines. Pain relievers, including aspirin, acetaminophen, or ibuprofen. Eye drops or ointment to keep your eye moist. Eye protection, if you cannot close your eye. Exercises or massage to regain muscle strength and function (physical therapy). Follow these instructions at home:  Take over-the-counter and prescription medicines only as told by your health care provider. If your eye is affected: Keep your eye moist with eye drops or ointment as told by your health care provider. Follow instructions for eye care and protection as told by your health care provider. Do any physical therapy exercises as told by your health care provider. Keep all follow-up visits. This is important. Contact a health care provider if: You have a fever or chills. Your symptoms do not get better within 2-3 weeks, or your symptoms get worse. Your eye is red, irritated, or painful. You have new symptoms. Get help right away if: You have weakness or numbness in a part of your body other than your face. You have trouble swallowing. You develop neck pain or stiffness. You develop dizziness or shortness of breath. Summary Bell's palsy is a short-term inability to move muscles in a part of the face. The inability to move results from inflammation or compression of the facial nerve. This condition affects every person   differently. Sometimes symptoms go away without treatment within a couple weeks. If treatment is needed, it varies from person to person. The goal of treatment is to reduce inflammation and protect the eye from damage. Contact  your health care provider if your symptoms do not get better within 2-3 weeks, or your symptoms get worse. This information is not intended to replace advice given to you by your health care provider. Make sure you discuss any questions you have with your health care provider. Document Revised: 03/12/2020 Document Reviewed: 03/12/2020 Elsevier Patient Education  2023 Elsevier Inc.  

## 2022-08-26 NOTE — Progress Notes (Signed)
Subjective:    Patient ID: Lauren Cardenas, female    DOB: Apr 21, 1986, 37 y.o.   MRN: [id number]   Chief Complaint: Left side of face swollen (Currently taking Macrobid for a UTI)   Woke u p in the middle of night with left sided facial swelling. Has gone down slightly. She is currently on macrobid for a UTI.     Patient Active Problem List   Diagnosis Date Noted   SIRS (systemic inflammatory response syndrome) ([place]) 12/02/2021   Diabetes ([place]) 10/12/2021   Sunburn, blistering 01/18/2021   BMI 37.0-37.9, adult 05/06/2019   GERD without esophagitis 08/31/2018   Wheezing 08/31/2018   Depression, major, recurrent ([place]) 04/25/2018   Generalized anxiety disorder 04/04/2018   Pyelonephritis 11/04/2017   Postoperative upper abdominal pain 10/31/2017   Prediabetes 05/30/2017   Vitamin D deficiency 05/30/2017   Hyperlipidemia associated with type 2 diabetes mellitus ([place]) 05/30/2017   Morbid obesity ([place]) 05/30/2017   Migraine without aura and without status migrainosus, not intractable 05/09/2017   Other fatigue 03/13/2017   Overactive bladder 11/14/2015   Syncope and collapse 09/27/2015   Nephrolithiasis 09/27/2015   Interstitial cystitis 09/27/2015   Vesico-ureteral reflux 09/15/2014   History of kidney stones 07/28/2014   Syncope 03/20/2012       Review of Systems  Constitutional:  Negative for chills and fever.  Eyes:  Negative for photophobia and visual disturbance.  Neurological:  Positive for facial asymmetry. Negative for headaches.       Objective:   Physical Exam Vitals reviewed.  Constitutional:      Appearance: Normal appearance.  Cardiovascular:     Rate and Rhythm: Regular rhythm.     Heart sounds: Normal heart sounds.  Pulmonary:     Effort: Pulmonary effort is normal.     Breath sounds: Normal breath sounds.  Skin:    General: Skin is warm.  Neurological:     General: No focal deficit present.     Mental Status: She is oriented to person,  place, and time.  Psychiatric:        Mood and Affect: Mood normal.        Behavior: Behavior normal.     BP 126/74   Pulse 78   Temp 97.9 F (36.6 C) (Temporal)   Resp 20   Ht '5\' 1"'$  (1.549 m)   Wt 195 lb (88.5 kg)   SpO2 96%   BMI 36.84 kg/m        Assessment & Plan:   Lauren Cardenas in today with chief complaint of Left side of face swollen (Currently taking Macrobid for a UTI)   1. Left facial swelling watch for worsening Ice Discussed bells palsy Meds ordered this encounter  Medications   predniSONE (STERAPRED UNI-PAK 21 TAB) 10 MG (21) TBPK tablet    Sig: As directed x 6 days    Dispense:  21 tablet    Refill:  0    Order Specific Question:   Supervising Provider    Answer:   Worthy Rancher [id number]     [place] with Dr. Lajuana Ripple   The above assessment and management plan was discussed with the patient. The patient verbalized understanding of and has agreed to the management plan. Patient is aware to call the clinic if symptoms persist or worsen. Patient is aware when to return to the clinic for a follow-up visit. Patient educated on when it is appropriate to go to the emergency department.   Mary-Margaret Hassell Done, FNP

## 2022-09-29 DIAGNOSIS — N200 Calculus of kidney: Secondary | ICD-10-CM | POA: Diagnosis not present

## 2022-09-29 DIAGNOSIS — N3946 Mixed incontinence: Secondary | ICD-10-CM | POA: Diagnosis not present

## 2022-09-29 DIAGNOSIS — N3020 Other chronic cystitis without hematuria: Secondary | ICD-10-CM | POA: Diagnosis not present

## 2022-10-10 ENCOUNTER — Encounter: Payer: Self-pay | Admitting: Family Medicine

## 2022-10-10 ENCOUNTER — Ambulatory Visit (INDEPENDENT_AMBULATORY_CARE_PROVIDER_SITE_OTHER): Payer: Medicaid Other | Admitting: Family Medicine

## 2022-10-10 VITALS — BP 128/82 | HR 80 | Temp 98.5°F | Ht 61.0 in | Wt 196.0 lb

## 2022-10-10 DIAGNOSIS — Z124 Encounter for screening for malignant neoplasm of cervix: Secondary | ICD-10-CM

## 2022-10-10 DIAGNOSIS — E559 Vitamin D deficiency, unspecified: Secondary | ICD-10-CM | POA: Diagnosis not present

## 2022-10-10 DIAGNOSIS — E1169 Type 2 diabetes mellitus with other specified complication: Secondary | ICD-10-CM | POA: Diagnosis not present

## 2022-10-10 DIAGNOSIS — E785 Hyperlipidemia, unspecified: Secondary | ICD-10-CM | POA: Diagnosis not present

## 2022-10-10 DIAGNOSIS — E119 Type 2 diabetes mellitus without complications: Secondary | ICD-10-CM | POA: Diagnosis not present

## 2022-10-10 DIAGNOSIS — Z1159 Encounter for screening for other viral diseases: Secondary | ICD-10-CM | POA: Diagnosis not present

## 2022-10-10 DIAGNOSIS — Z0001 Encounter for general adult medical examination with abnormal findings: Secondary | ICD-10-CM | POA: Diagnosis not present

## 2022-10-10 DIAGNOSIS — G43009 Migraine without aura, not intractable, without status migrainosus: Secondary | ICD-10-CM

## 2022-10-10 DIAGNOSIS — K219 Gastro-esophageal reflux disease without esophagitis: Secondary | ICD-10-CM

## 2022-10-10 DIAGNOSIS — Z0000 Encounter for general adult medical examination without abnormal findings: Secondary | ICD-10-CM

## 2022-10-10 DIAGNOSIS — J4530 Mild persistent asthma, uncomplicated: Secondary | ICD-10-CM

## 2022-10-10 LAB — BAYER DCA HB A1C WAIVED: HB A1C (BAYER DCA - WAIVED): 6.8 % — ABNORMAL HIGH (ref 4.8–5.6)

## 2022-10-10 LAB — LIPID PANEL

## 2022-10-10 MED ORDER — FLUTICASONE PROPIONATE HFA 44 MCG/ACT IN AERO: 1.0000 | INHALATION_SPRAY | Freq: Two times a day (BID) | RESPIRATORY_TRACT | 3 refills | Status: DC

## 2022-10-10 MED ORDER — FLUTICASONE PROPIONATE 50 MCG/ACT NA SUSP: 2.0000 | Freq: Every day | NASAL | 99 refills | Status: DC | PRN

## 2022-10-10 MED ORDER — SEMAGLUTIDE (2 MG/DOSE) 8 MG/3ML ~~LOC~~ SOPN: 2.0000 mg | PEN_INJECTOR | SUBCUTANEOUS | 3 refills | Status: DC

## 2022-10-10 MED ORDER — FAMOTIDINE 20 MG PO TABS: 20.0000 mg | ORAL_TABLET | Freq: Every day | ORAL | 3 refills | Status: DC

## 2022-10-10 MED ORDER — METFORMIN HCL 500 MG PO TABS: 500.0000 mg | ORAL_TABLET | Freq: Every day | ORAL | 3 refills | Status: DC

## 2022-10-10 MED ORDER — PROAIR HFA 108 (90 BASE) MCG/ACT IN AERS: 2.0000 | INHALATION_SPRAY | Freq: Four times a day (QID) | RESPIRATORY_TRACT | 3 refills | Status: DC | PRN

## 2022-10-10 MED ORDER — PROPRANOLOL HCL ER 120 MG PO CP24: 120.0000 mg | ORAL_CAPSULE | Freq: Every day | ORAL | 3 refills | Status: DC

## 2022-10-10 MED ORDER — ROSUVASTATIN CALCIUM 5 MG PO TABS: 5.0000 mg | ORAL_TABLET | Freq: Every day | ORAL | 3 refills | Status: DC

## 2022-10-10 NOTE — Progress Notes (Signed)
Lauren Cardenas is a 37 y.o. female presents to office today for annual physical exam examination.    Concerns today include: 1. Type 2 Diabetes with hyperlipidemia associated with obesity:  She is compliant with Ozempic 1 mg weekly, metformin 500 mg daily, Crestor 5 mg daily and propranolol 120 mg daily.  No chest pain, shortness of breath, nausea, vomiting, visual disturbance, polydipsia or polyuria reported.  Last eye exam: UTD Last foot exam: UTD Last A1c:  Lab Results  Component Value Date   HGBA1C 6.5 (H) 04/22/2022   Nephropathy screen indicated?: needs Last flu, zoster and/or pneumovax:  Immunization History  Administered Date(s) Administered   DTaP 03/15/1989, 03/12/1990   Hepatitis B 04/04/1996, 05/13/1996, 10/15/1996   IPV 03/15/1989, 03/12/1990   Influenza Split 06/14/2012   Influenza,inj,Quad PF,6+ Mos 08/17/2017, 04/04/2018, 05/06/2019, 04/22/2022   MMR 03/12/1990   PNEUMOCOCCAL CONJUGATE-20 12/01/2021   Tdap 06/16/2012    Occupation: Works as an Print production planner at [employer]   Diet: Research officer, trade union, Exercise: No structured reported Last eye exam: Up-to-date Last dental exam: Up-to-date Last colonoscopy: N/A Last mammogram: N/A Last pap smear: Up-to-date.  Every 5 years requested Refills needed today: All Immunizations needed: Immunization History  Administered Date(s) Administered   DTaP 03/15/1989, 03/12/1990   Hepatitis B 04/04/1996, 05/13/1996, 10/15/1996   IPV 03/15/1989, 03/12/1990   Influenza Split 06/14/2012   Influenza,inj,Quad PF,6+ Mos 08/17/2017, 04/04/2018, 05/06/2019, 04/22/2022   MMR 03/12/1990   PNEUMOCOCCAL CONJUGATE-20 12/01/2021   Tdap 06/16/2012     Past Medical History:  Diagnosis Date   Acute pyelonephritis 09/27/2015   Chronic migraine    neurologist-  dr Lucia Gaskins   Depression 05/30/2017   with emotional eating   Diabetes mellitus without complication (HCC)    DM type 2   Dysuria    GERD (gastroesophageal reflux  disease)    History of acute pyelonephritis    09-27-2015:   11-04-2017  w/ sepsis due to obstruction from kidney stone   History of COVID-19 07/02/2020   mild symptoms x few days all symptoms resolved   History of kidney stones    IC (interstitial cystitis)    Left ureteral stone    OAB (overactive bladder)    Urge urinary incontinence    Vesicoureteral reflux    Social History   Socioeconomic History   Marital status: Married    Spouse name: [name]   Number of children: 1   Years of education: 12   Highest education level: Not on file  Occupational History   Occupation: [employer]  Tobacco Use   Smoking status: Never   Smokeless tobacco: Never  Vaping Use   Vaping Use: Never used  Substance and Sexual Activity   Alcohol use: No   Drug use: No   Sexual activity: Yes    Birth control/protection: None  Other Topics Concern   Not on file  Social History Narrative   Lives with husband and daughter and in-laws   Daughter, Summer, with nonverbal Autism   Her spouse (who works from home) is very supportive   Caffeine use: 100-150mg /week   She drinks about 3 cups of diet caffeine soda per week   She has been going to [facility]   Social Determinants of Health   Financial Resource Strain: Not on file  Food Insecurity: Not on file  Transportation Needs: Not on file  Physical Activity: Not on file  Stress: Not on file  Social Connections: Not on file  Intimate Partner Violence: Not on file   Past Surgical History:  Procedure Laterality Date   CHOLECYSTECTOMY N/A 10/26/2017   Procedure: LAPAROSCOPIC CHOLECYSTECTOMY WITH INTRAOPERATIVE CHOLANGIOGRAM ERAS PATHWAY;  Surgeon: Harriette Bouillon, MD;  Location: [facility];  Service: General;  Laterality: N/A;   CYSTOSCOPY W/ URETERAL STENT PLACEMENT Right 09/27/2015   Procedure: CYSTOSCOPY WITH RETROGRADE PYELOGRAM/URETERAL STENT PLACEMENT;  Surgeon: Hildred Laser, MD;  Location: [facility];  Service:  Urology;  Laterality: Right;   CYSTOSCOPY WITH RETROGRADE PYELOGRAM, URETEROSCOPY AND STENT PLACEMENT Left 09/09/2014   Procedure: CYSTOSCOPY WITH LEFT RETROGRADE PYELOGRAM, URETEROSCOPY AND STONE EXTRACTION  DOUBLE J STENT PLACEMENT;  Surgeon: Jerilee Field, MD;  Location: [facility];  Service: Urology;  Laterality: Left;   CYSTOSCOPY WITH STENT PLACEMENT Left 11/04/2017   Procedure: CYSTOSCOPY WITH RETROGRADE AND LEFT STENT PLACEMENT;  Surgeon: Crista Elliot, MD;  Location: [facility];  Service: Urology;  Laterality: Left;   CYSTOSCOPY/URETEROSCOPY/HOLMIUM LASER/STENT PLACEMENT Right 10/16/2015   Procedure: RIGHT URETEROSCOPY/HOLMIUM LASER/STENT PLACEMENT;  Surgeon: Jerilee Field, MD;  Location[facility];  Service: Urology;  Laterality: Right;   CYSTOSCOPY/URETEROSCOPY/HOLMIUM LASER/STENT PLACEMENT Left 11/21/2017   Procedure: CYSTOSCOPY/URETEROSCOPY/HOLMIUM LASER/STENT PLACEMENT;  Surgeon: Jerilee Field, MD;  [facility];  Service: Urology;  Laterality: Left;  ONLY NEEDS 60 MIN   CYSTOSCOPY/URETEROSCOPY/HOLMIUM LASER/STENT PLACEMENT Left 04/20/2021   Procedure: CYSTOSCOPY/RETROGRADE/URETEROSCOPY/BASKET STONE EXTRACTION/STENT PLACEMENT;  Surgeon: Jerilee Field, MD;  Location[facility];  Service: Urology;  Laterality: Left;   HOLMIUM LASER APPLICATION Left 09/09/2014   Procedure: HOLMIUM LASER APPLICATION;  Surgeon: Jerilee Field, MD;  Location: [facility];  Service: Urology;  Laterality: Left;   HOLMIUM LASER APPLICATION Right 10/16/2015   Procedure: HOLMIUM LASER APPLICATION;  Surgeon: Jerilee Field, MD;  Location: [facility];  Service: Urology;  Laterality: Right;   STONE EXTRACTION WITH BASKET Right 10/16/2015   Procedure: STONE EXTRACTION WITH BASKET;  Surgeon: Jerilee Field, MD;  Location[facility];  Service: Urology;  Laterality: Right;   WISDOM TOOTH EXTRACTION  03/2014   Family History   Problem Relation Age of Onset   Hypertension Mother    Obesity Mother    Diabetes Father    Kidney disease Father    Sudden death Father     Current Outpatient Medications:    acetaminophen (TYLENOL) 500 MG tablet, Take 1 tablet (500 mg total) by mouth every 6 (six) hours as needed. (Patient taking differently: Take 500 mg by mouth every 6 (six) hours as needed for mild pain.), Disp: 30 tablet, Rfl: 0   blood glucose meter kit and supplies, Dispense based on patient and insurance preference. Use up to four times daily as directed. (FOR ICD-10 E10.9, E11.9)., Disp: 1 each, Rfl: 0   cetirizine (ZYRTEC) 10 MG chewable tablet, Chew 1 tablet (10 mg total) by mouth daily., Disp: 90 tablet, Rfl: 3   Cholecalciferol (VITAMIN D-3) 25 MCG (1000 UT) CAPS, Take 1,000 Units by mouth daily with lunch., Disp: , Rfl:    famotidine (PEPCID) 20 MG tablet, Take 1 tablet (20 mg total) by mouth daily. For acid reflux (Patient taking differently: Take 20 mg by mouth in the morning and at bedtime.), Disp: 100 tablet, Rfl: 3   fluticasone (FLONASE) 50 MCG/ACT nasal spray, Place 2 sprays into both nostrils daily. (Patient taking differently: Place 2 sprays into both nostrils daily as needed for allergies or rhinitis.), Disp: 16 g, Rfl: 6   fluticasone (FLOVENT HFA) 44 MCG/ACT inhaler, Inhale  1 puff into the lungs 2 (two) times daily., Disp: 31.8 each, Rfl: 3   glucose blood test strip, Use as instructed once daily as directed for sugar monitoring E11.9, Disp: 100 each, Rfl: 3   hydrOXYzine (ATARAX) 10 MG tablet, Take 10 mg by mouth at bedtime as needed ("for urinary frequency")., Disp: , Rfl:    Lancets MISC, UAD to check BGs once daily as directed E11.9, Disp: 100 each, Rfl: 3   metFORMIN (GLUCOPHAGE) 500 MG tablet, Take 1 tablet (500 mg total) by mouth at bedtime., Disp: 60 tablet, Rfl: 0   Meth-Hyo-M Bl-Na Phos-Ph Sal (URIBEL) 118 MG CAPS, Take 1 capsule by mouth daily as needed (for urinary pain/discomfort)., Disp:  , Rfl:    nitrofurantoin, macrocrystal-monohydrate, (MACROBID) 100 MG capsule, Take 100 mg by mouth 2 (two) times daily., Disp: , Rfl:    OMEGA-3 FATTY ACIDS PO, Take 1,000 mg by mouth daily., Disp: , Rfl:    ondansetron (ZOFRAN-ODT) 4 MG disintegrating tablet, Take 1 tablet (4 mg total) by mouth every 8 (eight) hours as needed for nausea or vomiting., Disp: 20 tablet, Rfl: 0   oxybutynin (DITROPAN) 5 MG tablet, Take 5 mg by mouth 4 (four) times daily as needed for bladder spasms., Disp: , Rfl:    predniSONE (STERAPRED UNI-PAK 21 TAB) 10 MG (21) TBPK tablet, As directed x 6 days, Disp: 21 tablet, Rfl: 0   PROAIR HFA 108 (90 Base) MCG/ACT inhaler, Inhale 2 puffs into the lungs every 6 (six) hours as needed for wheezing or shortness of breath., Disp: 8 g, Rfl: 3   promethazine (PHENERGAN) 25 MG tablet, Take 1 tablet (25 mg total) by mouth every 6 (six) hours as needed for nausea or vomiting., Disp: 30 tablet, Rfl: 0   propranolol ER (INDERAL LA) 120 MG 24 hr capsule, Take 1 capsule (120 mg total) by mouth at bedtime., Disp: 100 capsule, Rfl: 3   Rimegepant Sulfate (NURTEC) 75 MG TBDP, Take 1 tablet by mouth daily as needed. (Patient taking differently: Take 75 mg by mouth daily as needed (for migraines).), Disp: 2 tablet, Rfl: 0   rizatriptan (MAXALT-MLT) 10 MG disintegrating tablet, Take 1 tablet (10 mg total) by mouth 3 (three) times daily as needed for migraine. (Patient taking differently: Take 10 mg by mouth 3 (three) times daily as needed for migraine (dissolve orally).), Disp: 9 tablet, Rfl: 11   rosuvastatin (CRESTOR) 5 MG tablet, Take 1 tablet (5 mg total) by mouth at bedtime., Disp: 90 tablet, Rfl: 3   Semaglutide, 1 MG/DOSE, 4 MG/3ML SOPN, Inject 1 mg as directed once a week. **dose change, Disp: 9 mL, Rfl: 1   sertraline (ZOLOFT) 100 MG tablet, Take 1 tablet (100 mg total) by mouth daily., Disp: 60 tablet, Rfl: 0  Allergies  Allergen Reactions   Amoxicillin Hives and Other (See Comments)     Got ceftriaxone in the past Has patient had a PCN reaction causing immediate rash, facial/tongue/throat swelling, SOB or lightheadedness with hypotension: Yes Has patient had a PCN reaction causing severe rash involving mucus membranes or skin necrosis: No Has patient had a PCN reaction that required hospitalization: No Has patient had a PCN reaction occurring within the last 10 years: Yes If all of the above answers are "NO", then may proceed with Cephalosporin use.   Sudafed [Pseudoephedrine Hcl] Shortness Of Breath     ROS: Review of Systems Pertinent items noted in HPI and remainder of comprehensive ROS otherwise negative.    Physical exam  BP 128/82   Pulse 80   Temp 98.5 F (36.9 C)   Ht 5\' 1"  (1.549 m)   Wt 196 lb (88.9 kg)   LMP 09/15/2022   SpO2 94%   BMI 37.03 kg/m  General appearance: alert, cooperative, appears stated age, and no distress; cushingoid body habitus Head: Normocephalic, without obvious abnormality, atraumatic Eyes: negative findings: lids and lashes normal, conjunctivae and sclerae normal, corneas clear, and pupils equal, round, reactive to light and accomodation Ears: normal TM's and external ear canals both ears Nose: Nares normal. Septum midline. Mucosa normal. No drainage or sinus tenderness. Throat: lips, mucosa, and tongue normal; teeth and gums normal Neck: no adenopathy, no carotid bruit, supple, symmetrical, trachea midline, and thyroid not enlarged, symmetric, no tenderness/mass/nodules Back: symmetric, no curvature. ROM normal. No CVA tenderness. Lungs: clear to auscultation bilaterally Heart: regular rate and rhythm, S1, S2 normal, no murmur, click, rub or gallop Abdomen: soft, non-tender; bowel sounds normal; no masses,  no organomegaly Extremities: extremities normal, atraumatic, no cyanosis or edema Pulses: 2+ and symmetric Skin: Skin color, texture, turgor normal. No rashes or lesions Lymph nodes: Cervical, supraclavicular, and  axillary nodes normal. Neurologic: Grossly normal Diabetic Foot Exam - Simple   Simple Foot Form Diabetic Foot exam was performed with the following findings: Yes 10/10/2022  9:48 AM  Visual Inspection No deformities, no ulcerations, no other skin breakdown bilaterally: Yes Sensation Testing Intact to touch and monofilament testing bilaterally: Yes Pulse Check Posterior Tibialis and Dorsalis pulse intact bilaterally: Yes Comments        Assessment/ Plan: Lauren Cardenas here for annual physical exam.   Annual physical exam  Screening for malignant neoplasm of cervix  Encounter for hepatitis C screening test for low risk patient - Plan: Hepatitis C antibody  Morbid obesity - Plan: CMP14+EGFR, Lipid Panel, Bayer DCA Hb A1c Waived  Vitamin D deficiency - Plan: VITAMIN D 25 Hydroxy (Vit-D Deficiency, Fractures)  Type 2 diabetes mellitus without complication, without long-term current use of insulin - Plan: Microalbumin / creatinine urine ratio, CMP14+EGFR, Bayer DCA Hb A1c Waived, CBC  Hyperlipidemia associated with type 2 diabetes mellitus - Plan: CMP14+EGFR, Lipid Panel, TSH, rosuvastatin (CRESTOR) 5 MG tablet  Migraine without aura and without status migrainosus, not intractable - Plan: propranolol ER (INDERAL LA) 120 MG 24 hr capsule  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  Prefers to have every 5 years rather than every 3 so I have updated this in the EMR.  Screening hepatitis C collected along with fasting labs today.  Continues to struggle with morbid obesity and is not quite meeting clinical goals from a dietary and weight loss standpoint so we will advance her Ozempic to 2 mg weekly.  Sugar however was at adequate control at 6.8 of an A1c today.  Continue statin.   Check lipid panel, liver enzymes and thyroid-stimulating hormone  Propranolol renewed for migraine prevention  Flovent and Flonase renewed for bronchospasm associated with allergies  Pepcid chronic and stable with Pepcid daily.  Rx renewed  May follow-up in 3 to 6 months, sooner if concerns arise  Counseled on healthy lifestyle choices, including diet (rich in fruits, vegetables and lean meats and low in salt and simple carbohydrates) and exercise (at least 30 minutes of moderate physical activity daily).    Lauren Cardenas M. Nadine Counts,  DO

## 2022-10-10 NOTE — Patient Instructions (Signed)

## 2022-10-11 ENCOUNTER — Other Ambulatory Visit: Payer: Self-pay | Admitting: Family Medicine

## 2022-10-11 ENCOUNTER — Encounter: Payer: Self-pay | Admitting: Family Medicine

## 2022-10-11 DIAGNOSIS — J4530 Mild persistent asthma, uncomplicated: Secondary | ICD-10-CM

## 2022-10-11 LAB — CMP14+EGFR
ALT: 13 IU/L (ref 0–32)
AST: 12 IU/L (ref 0–40)
Albumin/Globulin Ratio: 1.4 (ref 1.2–2.2)
Albumin: 4.3 g/dL (ref 3.9–4.9)
Alkaline Phosphatase: 75 IU/L (ref 44–121)
BUN/Creatinine Ratio: 19 (ref 9–23)
BUN: 15 mg/dL (ref 6–20)
Bilirubin Total: 0.2 mg/dL (ref 0.0–1.2)
CO2: 23 mmol/L (ref 20–29)
Calcium: 9.5 mg/dL (ref 8.7–10.2)
Chloride: 103 mmol/L (ref 96–106)
Creatinine, Ser: 0.81 mg/dL (ref 0.57–1.00)
Globulin, Total: 3 g/dL (ref 1.5–4.5)
Glucose: 132 mg/dL — ABNORMAL HIGH (ref 70–99)
Potassium: 4.6 mmol/L (ref 3.5–5.2)
Sodium: 140 mmol/L (ref 134–144)
Total Protein: 7.3 g/dL (ref 6.0–8.5)
eGFR: 95 mL/min/{1.73_m2} (ref >59)

## 2022-10-11 LAB — CBC
Hematocrit: 41.5 % (ref 34.0–46.6)
Hemoglobin: 12.9 g/dL (ref 11.1–15.9)
MCH: 24.2 pg — ABNORMAL LOW (ref 26.6–33.0)
MCHC: 31.1 g/dL — ABNORMAL LOW (ref 31.5–35.7)
MCV: 78 fL — ABNORMAL LOW (ref 79–97)
Platelets: 264 10*3/uL (ref 150–450)
RBC: 5.34 x10E6/uL — ABNORMAL HIGH (ref 3.77–5.28)
RDW: 14.9 % (ref 11.7–15.4)
WBC: 11.7 10*3/uL — ABNORMAL HIGH (ref 3.4–10.8)

## 2022-10-11 LAB — LIPID PANEL
Chol/HDL Ratio: 3 ratio (ref 0.0–4.4)
Cholesterol, Total: 167 mg/dL (ref 100–199)
HDL: 56 mg/dL (ref >39)
LDL Chol Calc (NIH): 79 mg/dL (ref 0–99)
Triglycerides: 191 mg/dL — ABNORMAL HIGH (ref 0–149)
VLDL Cholesterol Cal: 32 mg/dL (ref 5–40)

## 2022-10-11 LAB — MICROALBUMIN / CREATININE URINE RATIO
Creatinine, Urine: 112.8 mg/dL
Microalb/Creat Ratio: 289 mg/g creat — ABNORMAL HIGH (ref 0–29)
Microalbumin, Urine: 326.3 ug/mL

## 2022-10-11 LAB — HEPATITIS C ANTIBODY: Hep C Virus Ab: NONREACTIVE

## 2022-10-11 LAB — TSH: TSH: 1.07 u[IU]/mL (ref 0.450–4.500)

## 2022-10-11 LAB — VITAMIN D 25 HYDROXY (VIT D DEFICIENCY, FRACTURES): Vit D, 25-Hydroxy: 23.8 ng/mL — ABNORMAL LOW (ref 30.0–100.0)

## 2022-10-11 MED ORDER — ALBUTEROL SULFATE HFA 108 (90 BASE) MCG/ACT IN AERS: 2.0000 | INHALATION_SPRAY | Freq: Four times a day (QID) | RESPIRATORY_TRACT | 1 refills | Status: AC | PRN

## 2022-10-11 NOTE — Progress Notes (Signed)
Proair no longer made  Meds ordered this encounter  Medications   albuterol (VENTOLIN HFA) 108 (90 Base) MCG/ACT inhaler    Sig: Inhale 2 puffs into the lungs every 6 (six) hours as needed for wheezing or shortness of breath.    Dispense:  6.7 g    Refill:  1

## 2022-10-15 ENCOUNTER — Other Ambulatory Visit: Payer: Self-pay | Admitting: Family Medicine

## 2022-10-15 DIAGNOSIS — F331 Major depressive disorder, recurrent, moderate: Secondary | ICD-10-CM

## 2022-10-15 DIAGNOSIS — F411 Generalized anxiety disorder: Secondary | ICD-10-CM

## 2022-10-27 ENCOUNTER — Encounter: Payer: Self-pay | Admitting: Family Medicine

## 2022-11-26 ENCOUNTER — Encounter: Payer: Self-pay | Admitting: Family Medicine

## 2023-02-10 ENCOUNTER — Ambulatory Visit (INDEPENDENT_AMBULATORY_CARE_PROVIDER_SITE_OTHER): Payer: Self-pay | Admitting: Family Medicine

## 2023-02-10 ENCOUNTER — Encounter: Payer: Self-pay | Admitting: Family Medicine

## 2023-02-10 VITALS — BP 130/80 | HR 79 | Temp 98.7°F | Ht 61.0 in | Wt 201.2 lb

## 2023-02-10 DIAGNOSIS — E1129 Type 2 diabetes mellitus with other diabetic kidney complication: Secondary | ICD-10-CM

## 2023-02-10 DIAGNOSIS — R809 Proteinuria, unspecified: Secondary | ICD-10-CM

## 2023-02-10 DIAGNOSIS — E119 Type 2 diabetes mellitus without complications: Secondary | ICD-10-CM

## 2023-02-10 DIAGNOSIS — E785 Hyperlipidemia, unspecified: Secondary | ICD-10-CM

## 2023-02-10 DIAGNOSIS — E1169 Type 2 diabetes mellitus with other specified complication: Secondary | ICD-10-CM

## 2023-02-10 DIAGNOSIS — T783XXS Angioneurotic edema, sequela: Secondary | ICD-10-CM

## 2023-02-10 DIAGNOSIS — Z7985 Long-term (current) use of injectable non-insulin antidiabetic drugs: Secondary | ICD-10-CM

## 2023-02-10 LAB — BAYER DCA HB A1C WAIVED: HB A1C (BAYER DCA - WAIVED): 7.5 % — ABNORMAL HIGH (ref 4.8–5.6)

## 2023-02-10 MED ORDER — PHENTERMINE HCL 37.5 MG PO TABS: 18.7500 mg | ORAL_TABLET | Freq: Every day | ORAL | 0 refills | Status: DC

## 2023-02-10 MED ORDER — DAPAGLIFLOZIN PROPANEDIOL 10 MG PO TABS: 10.0000 mg | ORAL_TABLET | Freq: Every day | ORAL | Status: DC

## 2023-02-10 NOTE — Progress Notes (Signed)
Subjective: CC:DM PCP: Raliegh Ip, DO [id number] D Jagoe is a 37 y.o. female presenting to clinic today for:  1. Type 2 Diabetes with hypertension, hyperlipidemia and microalbuminuria:  Compliant with metformin 500 mg, Crestor 5 mg and semaglutide 2 mg.  She unfortunately has been experiencing some weight gain.  She does not exercise with any regularity. Diabetes Health Maintenance Due  Topic Date Due   OPHTHALMOLOGY EXAM  02/03/2023   HEMOGLOBIN A1C  04/11/2023   FOOT EXAM  10/10/2023    Last A1c:  Lab Results  Component Value Date   HGBA1C 6.8 (H) 10/10/2022    ROS: No chest pain, shortness of breath, dizziness or falls reported.  No hypoglycemic episodes.  She desires weight loss medication  ROS: Per HPI  Allergies  Allergen Reactions   Amoxicillin Hives and Other (See Comments)    Got ceftriaxone in the past Has patient had a PCN reaction causing immediate rash, facial/tongue/throat swelling, SOB or lightheadedness with hypotension: Yes Has patient had a PCN reaction causing severe rash involving mucus membranes or skin necrosis: No Has patient had a PCN reaction that required hospitalization: No Has patient had a PCN reaction occurring within the last 10 years: Yes If all of the above answers are "NO", then may proceed with Cephalosporin use.   Sudafed [Pseudoephedrine Hcl] Shortness Of Breath   Past Medical History:  Diagnosis Date   Acute pyelonephritis 09/27/2015   Chronic migraine    neurologist-  dr Lucia Gaskins   Depression 05/30/2017   with emotional eating   Diabetes mellitus without complication (HCC)    DM type 2   Dysuria    GERD (gastroesophageal reflux disease)    History of acute pyelonephritis    09-27-2015:   11-04-2017  w/ sepsis due to obstruction from kidney stone   History of COVID-19 07/02/2020   mild symptoms x few days all symptoms resolved   History of kidney stones    IC (interstitial cystitis)    Left ureteral stone    OAB  (overactive bladder)    Urge urinary incontinence    Vesicoureteral reflux     Current Outpatient Medications:    acetaminophen (TYLENOL) 500 MG tablet, Take 1 tablet (500 mg total) by mouth every 6 (six) hours as needed. (Patient taking differently: Take 500 mg by mouth every 6 (six) hours as needed for mild pain.), Disp: 30 tablet, Rfl: 0   albuterol (VENTOLIN HFA) 108 (90 Base) MCG/ACT inhaler, Inhale 2 puffs into the lungs every 6 (six) hours as needed for wheezing or shortness of breath., Disp: 6.7 g, Rfl: 1   blood glucose meter kit and supplies, Dispense based on patient and insurance preference. Use up to four times daily as directed. (FOR ICD-10 E10.9, E11.9)., Disp: 1 each, Rfl: 0   cetirizine (ZYRTEC) 10 MG chewable tablet, Chew 1 tablet (10 mg total) by mouth daily., Disp: 90 tablet, Rfl: 3   Cholecalciferol (VITAMIN D-3) 25 MCG (1000 UT) CAPS, Take 1,000 Units by mouth daily with lunch., Disp: , Rfl:    famotidine (PEPCID) 20 MG tablet, Take 1 tablet (20 mg total) by mouth daily. For acid reflux, Disp: 100 tablet, Rfl: 3   fluticasone (FLONASE) 50 MCG/ACT nasal spray, Place 2 sprays into both nostrils daily as needed for allergies or rhinitis., Disp: 16 g, Rfl: PRN   fluticasone (FLOVENT HFA) 44 MCG/ACT inhaler, Inhale 1 puff into the lungs 2 (two) times daily., Disp: 31.8 each, Rfl: 3   glucose blood  test strip, Use as instructed once daily as directed for sugar monitoring E11.9, Disp: 100 each, Rfl: 3   hydrOXYzine (ATARAX) 10 MG tablet, Take 10 mg by mouth at bedtime as needed ("for urinary frequency")., Disp: , Rfl:    Lancets MISC, UAD to check BGs once daily as directed E11.9, Disp: 100 each, Rfl: 3   metFORMIN (GLUCOPHAGE) 500 MG tablet, Take 1 tablet (500 mg total) by mouth at bedtime., Disp: 90 tablet, Rfl: 3   Meth-Hyo-M Bl-Na Phos-Ph Sal (URIBEL) 118 MG CAPS, Take 1 capsule by mouth daily as needed (for urinary pain/discomfort)., Disp: , Rfl:    OMEGA-3 FATTY ACIDS PO, Take  1,000 mg by mouth daily., Disp: , Rfl:    ondansetron (ZOFRAN-ODT) 4 MG disintegrating tablet, Take 1 tablet (4 mg total) by mouth every 8 (eight) hours as needed for nausea or vomiting., Disp: 20 tablet, Rfl: 0   propranolol ER (INDERAL LA) 120 MG 24 hr capsule, Take 1 capsule (120 mg total) by mouth at bedtime., Disp: 100 capsule, Rfl: 3   rizatriptan (MAXALT-MLT) 10 MG disintegrating tablet, Take 1 tablet (10 mg total) by mouth 3 (three) times daily as needed for migraine. (Patient taking differently: Take 10 mg by mouth 3 (three) times daily as needed for migraine (dissolve orally).), Disp: 9 tablet, Rfl: 11   rosuvastatin (CRESTOR) 5 MG tablet, Take 1 tablet (5 mg total) by mouth at bedtime., Disp: 90 tablet, Rfl: 3   Semaglutide, 2 MG/DOSE, 8 MG/3ML SOPN, Inject 2 mg as directed once a week., Disp: 9 mL, Rfl: 3   sertraline (ZOLOFT) 100 MG tablet, Take 1 tablet by mouth once daily, Disp: 180 tablet, Rfl: 0   Vibegron (GEMTESA) 75 MG TABS, Take 75 mg by mouth daily. 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[employer]  Tobacco Use   Smoking status: Never   Smokeless tobacco: Never  Vaping Use   Vaping status: Never Used  Substance and Sexual Activity   Alcohol use: No   Drug use: No   Sexual activity: Yes    Birth control/protection: None  Other Topics Concern   Not on file  Social History Narrative   Lives with husband and daughter and in-laws   Daughter, Summer, with nonverbal Autism   Her spouse (who works from home) is very supportive   Caffeine use: 100-150mg /week   She drinks about 3 cups of diet caffeine soda per week   She has been going to [employer]   Social Determinants of Health   Financial Resource Strain: Medium Risk (02/09/2023)    Overall Financial Resource Strain (CARDIA)    Difficulty of Paying Living Expenses: Somewhat hard  Food Insecurity: No Food Insecurity (02/09/2023)   Hunger Vital Sign    Worried About Running Out of Food in the Last Year: Never true    Ran Out of Food in the Last Year: Never true  Transportation Needs: No Transportation Needs (02/09/2023)   PRAPARE - Administrator, Civil Service (Medical): No    Lack of Transportation (Non-Medical): No  Physical Activity: Insufficiently Active (02/09/2023)   Exercise Vital Sign    Days of Exercise per Week: 2 days    Minutes of Exercise per  Session: 20 min  Stress: Stress Concern Present (02/09/2023)   [employer] of Occupational Health - Occupational Stress Questionnaire    Feeling of Stress : Rather much  Social Connections: Moderately Integrated (02/09/2023)   Social Connection and Isolation Panel [NHANES]    Frequency of Communication with Friends and Family: More than three times a week    Frequency of Social Gatherings with Friends and Family: Once a week    Attends Religious Services: More than 4 times per year    Active Member of [employer] or Organizations: No    Attends Engineer, structural: Not on file    Marital Status: Married  Intimate Partner Violence: Unknown (10/01/2021)   Received from [facility]   HITS    Physically Hurt: Not on file    Insult or Talk Down To: Not on file    Threaten Physical Harm: Not on file    Scream or Curse: Not on file   Family History  Problem Relation Age of Onset   Hypertension Mother    Obesity Mother    Diabetes Father    Kidney disease Father    Sudden death Father     Objective: Office vital signs reviewed. BP 130/80   Pulse 79   Temp 98.7 F (37.1 C)   Ht 5\' 1"  (1.549 m)   Wt 201 lb 3.2 oz (91.3 kg)   LMP 02/02/2023   SpO2 95%   BMI 38.02 kg/m   Physical Examination:  General: Awake, alert, morbidly obese, No acute distress HEENT: sclera white,  MMM Cardio: regular rate and rhythm, S1S2 heard, no murmurs appreciated Pulm: clear to auscultation bilaterally, no wheezes, rhonchi or rales; normal work of breathing on room air     Assessment/ Plan: 37 y.o. female   Type 2 diabetes mellitus with microalbuminuria (HCC) - Plan: Bayer DCA Hb A1c Waived, dapagliflozin propanediol (FARXIGA) 10 MG TABS tablet, Basic metabolic panel  Hyperlipidemia associated with type 2 diabetes mellitus (HCC)  Morbid obesity (HCC) - Plan: Cortisol-am, blood, phentermine (ADIPEX-P) 37.5 MG tablet  Long-term current use of injectable noninsulin antidiabetic medication  Angioedema, sequela - Plan: CK, IGE, CBC with Differential   Sugar not at goal with A1c rising to 7.5.  Add [place].  She may start 5 mg and then advance to 10 mg in 2 weeks as long as this is being tolerated.  Check BMP in 2 weeks.  Discussed potential side effects of medication.  Patient to continue Crestor as directed  Adipex added to help with morbid obesity today.  Sadly she has not reached weight goals nor sugar goals with Ozempic.  We may consider switching her over to [facility] at some point.  However, uncertain given the potential loss in insurance soon  Raliegh Ip, DO [facility] [phone]

## 2023-02-10 NOTE — Patient Instructions (Signed)
Microalbumin Test Why am I having this test? Albumin is a protein in the body that helps regulate how much fluid is in the blood. Normally, when the kidneys filter waste from the bloodstream, waste leaves the body through urine, and important substances, such as albumin, remain in the blood. If the kidneys are damaged, they may no longer be able to keep albumin in the blood. This leads to small amounts of albumin (microalbumin, MA) in the urine. You may have this test: If you have signs of kidney damage, such as foamy urine or swelling in the hands, feet, abdomen, or face. To help evaluate kidney function after other kidney test results have been normal. To monitor treatment for diabetes, also called diabetes mellitus, especially if your blood sugar (glucose) has been poorly controlled or you have signs of kidney damage. MA in the urine is a common complication of diabetes. To monitor for complications caused by high blood pressure (hypertension). To help diagnose cardiovascular disease or a heart attack. What is being tested? This test measures the amount of MA in your urine. You may have your creatinine level tested at the same time as MA testing. Creatinine is a waste product that the kidneys filter out of the blood. Testing creatinine levels provides more information about kidney function. What kind of sample is taken?  A urine sample is required for this test. You may be asked to collect urine samples at home over a period of 24 hours. How do I collect samples at home? When collecting a urine sample at home, make sure you: Use supplies and instructions that you received from the lab. Collect urine only in the germ-free (sterile) cup that you received from the lab. Do not let any toilet paper or stool (feces) get into the cup. Refrigerate the sample until you can return it to the lab. Return the sample(s) to the lab as instructed. How do I prepare for this test? Follow instructions from  your health care provider about changing or stopping your regular medicines. This is especially important if you are taking diabetes medicines or blood thinners. Tell a health care provider about: Any medical conditions you have. All medicines you are taking, including vitamins, herbs, eye drops, creams, and over-the-counter medicines. Whether you are pregnant or may be pregnant. How are the results reported? Your test results will be reported as a value that indicates how much MA is in your urine. This may be given as: Milligrams of MA per deciliter of urine (mg/dL). Milligrams of MA per gram of creatinine (mg/g creatinine), if creatinine was also tested. Your health care provider will compare your results to normal ranges that were established after testing a large group of people (reference ranges). Reference ranges may vary among labs and hospitals. For this test, common reference ranges are: MA only: 0-2 mg/dL. MA and creatinine: Men: 0-17 mg/g creatinine. Women: 0-25 mg/g creatinine. What do the results mean? A result that is within the reference range means that you have a normal amount of MA in your urine. This may mean that: Your kidneys are functioning normally. Your diabetes or hypertension treatment is working effectively. Results that are higher than the reference range may mean that you have: Poorly controlled diabetes. Your treatment plan may need to be adjusted. Narrowing of the arteries caused by plaque buildup (atherosclerosis). Kidney damage caused by hypertension or diabetes (nephropathy). Talk with your health care provider about what your results mean. Questions to ask your health care provider Ask your  health care provider, or the department that is doing the test: When will my results be ready? How will I get my results? What are my treatment options? What other tests do I need? What are my next steps? Summary Albumin is a protein that helps regulate how much  fluid is in your blood. If your kidneys are damaged, they may no longer be able to keep albumin in your blood. This leads to small amounts of albumin (microalbumin, MA) in your urine. Results showing high MA levels may indicate nephropathy, which is kidney damage from diabetes or high blood pressure. Talk with your health care provider about what your results mean. This information is not intended to replace advice given to you by your health care provider. Make sure you discuss any questions you have with your health care provider. Document Revised: 09/06/2019 Document Reviewed: 09/06/2019 Elsevier Patient Education  2024 [employer].

## 2023-02-13 ENCOUNTER — Ambulatory Visit: Payer: Medicaid Other | Admitting: Family Medicine

## 2023-02-24 ENCOUNTER — Other Ambulatory Visit: Payer: Medicaid Other

## 2023-02-24 DIAGNOSIS — R809 Proteinuria, unspecified: Secondary | ICD-10-CM | POA: Diagnosis not present

## 2023-02-24 DIAGNOSIS — E1129 Type 2 diabetes mellitus with other diabetic kidney complication: Secondary | ICD-10-CM | POA: Diagnosis not present

## 2023-02-24 DIAGNOSIS — T783XXS Angioneurotic edema, sequela: Secondary | ICD-10-CM | POA: Diagnosis not present

## 2023-02-28 LAB — BASIC METABOLIC PANEL
BUN/Creatinine Ratio: 16 (ref 9–23)
BUN: 13 mg/dL (ref 6–20)
CO2: 19 mmol/L — ABNORMAL LOW (ref 20–29)
Calcium: 9.5 mg/dL (ref 8.7–10.2)
Chloride: 105 mmol/L (ref 96–106)
Creatinine, Ser: 0.81 mg/dL (ref 0.57–1.00)
Glucose: 148 mg/dL — ABNORMAL HIGH (ref 70–99)
Potassium: 4.7 mmol/L (ref 3.5–5.2)
Sodium: 140 mmol/L (ref 134–144)
eGFR: 95 mL/min/{1.73_m2} (ref >59)

## 2023-02-28 LAB — CBC WITH DIFFERENTIAL/PLATELET
Basophils Absolute: 0.1 10*3/uL (ref 0.0–0.2)
Basos: 1 %
EOS (ABSOLUTE): 0.6 10*3/uL — ABNORMAL HIGH (ref 0.0–0.4)
Eos: 6 %
Hematocrit: 41.6 % (ref 34.0–46.6)
Hemoglobin: 12.9 g/dL (ref 11.1–15.9)
Immature Grans (Abs): 0.1 10*3/uL (ref 0.0–0.1)
Immature Granulocytes: 1 %
Lymphocytes Absolute: 2.5 10*3/uL (ref 0.7–3.1)
Lymphs: 24 %
MCH: 24.2 pg — ABNORMAL LOW (ref 26.6–33.0)
MCHC: 31 g/dL — ABNORMAL LOW (ref 31.5–35.7)
MCV: 78 fL — ABNORMAL LOW (ref 79–97)
Monocytes Absolute: 0.6 10*3/uL (ref 0.1–0.9)
Monocytes: 5 %
Neutrophils Absolute: 6.7 10*3/uL (ref 1.4–7.0)
Neutrophils: 63 %
Platelets: 280 10*3/uL (ref 150–450)
RBC: 5.34 x10E6/uL — ABNORMAL HIGH (ref 3.77–5.28)
RDW: 15.2 % (ref 11.7–15.4)
WBC: 10.4 10*3/uL (ref 3.4–10.8)

## 2023-02-28 LAB — CORTISOL-AM, BLOOD: Cortisol - AM: 11.6 ug/dL (ref 6.2–19.4)

## 2023-02-28 LAB — CK: Total CK: 43 U/L (ref 32–182)

## 2023-02-28 LAB — IGE: IgE (Immunoglobulin E), Serum: 505 [IU]/mL — ABNORMAL HIGH (ref 6–495)

## 2023-03-09 ENCOUNTER — Ambulatory Visit (INDEPENDENT_AMBULATORY_CARE_PROVIDER_SITE_OTHER): Payer: Self-pay

## 2023-03-09 DIAGNOSIS — E119 Type 2 diabetes mellitus without complications: Secondary | ICD-10-CM

## 2023-03-09 LAB — HM DIABETES EYE EXAM

## 2023-03-09 NOTE — Progress Notes (Signed)
Lauren Cardenas arrived 03/09/2023 and has given verbal consent to obtain images and complete their overdue diabetic retinal screening.  The images have been sent to an ophthalmologist or optometrist for review and interpretation.  Results will be sent back to Raliegh Ip, DO for review.  Patient has been informed they will be contacted when we receive the results via telephone or MyChart

## 2023-03-10 NOTE — Progress Notes (Signed)
I have reviewed and agree with the above documentation.   Kari Baars, FNP-C [facility] [address] [place], [place] [address] [phone] 03/10/23

## 2023-03-14 ENCOUNTER — Telehealth: Payer: Self-pay

## 2023-03-14 NOTE — Telephone Encounter (Signed)
LMTCB   Received results of diabetic retinopath screening results.   Per Marcelino Duster- please follow up with opthalmology.  Will place referral if needed

## 2023-04-04 NOTE — Telephone Encounter (Signed)
Patient aware and verbalizes understanding. States she will let us know if referral is needed

## 2023-05-18 DIAGNOSIS — N200 Calculus of kidney: Secondary | ICD-10-CM | POA: Diagnosis not present

## 2023-05-18 DIAGNOSIS — N3020 Other chronic cystitis without hematuria: Secondary | ICD-10-CM | POA: Diagnosis not present

## 2023-05-18 DIAGNOSIS — N3946 Mixed incontinence: Secondary | ICD-10-CM | POA: Diagnosis not present

## 2023-06-06 ENCOUNTER — Ambulatory Visit: Payer: Medicaid Other | Admitting: Family Medicine

## 2023-06-06 ENCOUNTER — Encounter: Payer: Self-pay | Admitting: Family Medicine

## 2023-06-06 VITALS — BP 133/85 | HR 84 | Temp 98.8°F | Ht 61.0 in | Wt 203.0 lb

## 2023-06-06 DIAGNOSIS — E785 Hyperlipidemia, unspecified: Secondary | ICD-10-CM

## 2023-06-06 DIAGNOSIS — F331 Major depressive disorder, recurrent, moderate: Secondary | ICD-10-CM | POA: Diagnosis not present

## 2023-06-06 DIAGNOSIS — E1169 Type 2 diabetes mellitus with other specified complication: Secondary | ICD-10-CM | POA: Diagnosis not present

## 2023-06-06 DIAGNOSIS — Z23 Encounter for immunization: Secondary | ICD-10-CM

## 2023-06-06 DIAGNOSIS — G43009 Migraine without aura, not intractable, without status migrainosus: Secondary | ICD-10-CM

## 2023-06-06 DIAGNOSIS — F411 Generalized anxiety disorder: Secondary | ICD-10-CM

## 2023-06-06 DIAGNOSIS — E1129 Type 2 diabetes mellitus with other diabetic kidney complication: Secondary | ICD-10-CM

## 2023-06-06 DIAGNOSIS — Z7985 Long-term (current) use of injectable non-insulin antidiabetic drugs: Secondary | ICD-10-CM

## 2023-06-06 DIAGNOSIS — R809 Proteinuria, unspecified: Secondary | ICD-10-CM | POA: Diagnosis not present

## 2023-06-06 LAB — BAYER DCA HB A1C WAIVED: HB A1C (BAYER DCA - WAIVED): 8.3 % — ABNORMAL HIGH (ref 4.8–5.6)

## 2023-06-06 MED ORDER — TIRZEPATIDE 2.5 MG/0.5ML ~~LOC~~ SOAJ: 2.5000 mg | SUBCUTANEOUS | 0 refills | Status: DC

## 2023-06-06 MED ORDER — ROSUVASTATIN CALCIUM 5 MG PO TABS: 5.0000 mg | ORAL_TABLET | Freq: Every day | ORAL | 3 refills | Status: DC

## 2023-06-06 MED ORDER — METFORMIN HCL 500 MG PO TABS
500.0000 mg | ORAL_TABLET | Freq: Every day | ORAL | 3 refills | Status: AC
Start: 2023-06-06 — End: ?

## 2023-06-06 MED ORDER — ONDANSETRON 4 MG PO TBDP: 4.0000 mg | ORAL_TABLET | Freq: Three times a day (TID) | ORAL | 0 refills | Status: AC | PRN

## 2023-06-06 MED ORDER — EMPAGLIFLOZIN 10 MG PO TABS: 10.0000 mg | ORAL_TABLET | Freq: Every day | ORAL | 0 refills | Status: DC

## 2023-06-06 MED ORDER — PROPRANOLOL HCL ER 120 MG PO CP24: 120.0000 mg | ORAL_CAPSULE | Freq: Every day | ORAL | 3 refills | Status: DC

## 2023-06-06 MED ORDER — TIRZEPATIDE 7.5 MG/0.5ML ~~LOC~~ SOAJ: 7.5000 mg | SUBCUTANEOUS | 1 refills | Status: DC

## 2023-06-06 MED ORDER — TIRZEPATIDE 5 MG/0.5ML ~~LOC~~ SOAJ: 5.0000 mg | SUBCUTANEOUS | 0 refills | Status: DC

## 2023-06-06 MED ORDER — SERTRALINE HCL 100 MG PO TABS: 100.0000 mg | ORAL_TABLET | Freq: Every day | ORAL | 0 refills | Status: DC

## 2023-06-06 NOTE — Progress Notes (Signed)
Subjective: CC:DM PCP: Raliegh Ip, DO WUJ:[id number] D Hermans is a 37 y.o. female presenting to clinic today for:  1. Type 2 Diabetes with hypertension, hyperlipidemia and microalbuminuria:  She was given a trial of Farxiga at last visit but lost her insurance temporarily so never did pursue medication.  She has been without her GLP for couple of months now.  Compliant with statin and propranolol  Diabetes Health Maintenance Due  Topic Date Due   HEMOGLOBIN A1C  08/13/2023   FOOT EXAM  10/10/2023   OPHTHALMOLOGY EXAM  03/08/2024    Last A1c:  Lab Results  Component Value Date   HGBA1C 7.5 (H) 02/10/2023    ROS: Denies dizziness, LOC, polyuria, polydipsia, unintended weight loss/gain, foot ulcerations, numbness or tingling in extremities, shortness of breath or chest pain.  Has not utilize the phentermine that was given at last visit because she wants to make sure that she is ready for change.  Currently working through some stressors at home.   ROS: Per HPI  Allergies  Allergen Reactions   Amoxicillin Hives and Other (See Comments)    Got ceftriaxone in the past Has patient had a PCN reaction causing immediate rash, facial/tongue/throat swelling, SOB or lightheadedness with hypotension: Yes Has patient had a PCN reaction causing severe rash involving mucus membranes or skin necrosis: No Has patient had a PCN reaction that required hospitalization: No Has patient had a PCN reaction occurring within the last 10 years: Yes If all of the above answers are "NO", then may proceed with Cephalosporin use.   Sudafed [Pseudoephedrine Hcl] Shortness Of Breath   Past Medical History:  Diagnosis Date   Acute pyelonephritis 09/27/2015   Chronic migraine    neurologist-  dr Lucia Gaskins   Depression 05/30/2017   with emotional eating   Diabetes mellitus without complication (HCC)    DM type 2   Dysuria    GERD (gastroesophageal reflux disease)    History of acute pyelonephritis     09-27-2015:   11-04-2017  w/ sepsis due to obstruction from kidney stone   History of COVID-19 07/02/2020   mild symptoms x few days all symptoms resolved   History of kidney stones    IC (interstitial cystitis)    Left ureteral stone    OAB (overactive bladder)    Urge urinary incontinence    Vesicoureteral reflux     Current Outpatient Medications:    acetaminophen (TYLENOL) 500 MG tablet, Take 1 tablet (500 mg total) by mouth every 6 (six) hours as needed. (Patient taking differently: Take 500 mg by mouth every 6 (six) hours as needed for mild pain (pain score 1-3).), Disp: 30 tablet, Rfl: 0   albuterol (VENTOLIN HFA) 108 (90 Base) MCG/ACT inhaler, Inhale 2 puffs into the lungs every 6 (six) hours as needed for wheezing or shortness of breath., Disp: 6.7 g, Rfl: 1   blood glucose meter kit and supplies, Dispense based on patient and insurance preference. Use up to four times daily as directed. (FOR ICD-10 E10.9, E11.9)., Disp: 1 each, Rfl: 0   cetirizine (ZYRTEC) 10 MG chewable tablet, Chew 1 tablet (10 mg total) by mouth daily., Disp: 90 tablet, Rfl: 3   Cholecalciferol (VITAMIN D-3) 25 MCG (1000 UT) CAPS, Take 1,000 Units by mouth daily with lunch., Disp: , Rfl:    empagliflozin (JARDIANCE) 10 MG TABS tablet, Take 1 tablet (10 mg total) by mouth daily before breakfast. For sugar control., Disp: 90 tablet, Rfl: 0   famotidine (  PEPCID) 20 MG tablet, Take 1 tablet (20 mg total) by mouth daily. For acid reflux, Disp: 100 tablet, Rfl: 3   fluticasone (FLONASE) 50 MCG/ACT nasal spray, Place 2 sprays into both nostrils daily as needed for allergies or rhinitis., Disp: 16 g, Rfl: PRN   fluticasone (FLOVENT HFA) 44 MCG/ACT inhaler, Inhale 1 puff into the lungs 2 (two) times daily., Disp: 31.8 each, Rfl: 3   glucose blood test strip, Use as instructed once daily as directed for sugar monitoring E11.9, Disp: 100 each, Rfl: 3   Lancets MISC, UAD to check BGs once daily as directed E11.9, Disp: 100  each, Rfl: 3   Meth-Hyo-M Bl-Na Phos-Ph Sal (URIBEL) 118 MG CAPS, Take 1 capsule by mouth daily as needed (for urinary pain/discomfort)., Disp: , Rfl:    OMEGA-3 FATTY ACIDS PO, Take 1,000 mg by mouth daily., Disp: , Rfl:    phentermine (ADIPEX-P) 37.5 MG tablet, Take 0.5-1 tablets (18.75-37.5 mg total) by mouth daily before breakfast. X30 days.  Then take 1/2 tablet daily x60 days. Then stop., Disp: 60 tablet, Rfl: 0   rizatriptan (MAXALT-MLT) 10 MG disintegrating tablet, Take 1 tablet (10 mg total) by mouth 3 (three) times daily as needed for migraine. (Patient taking differently: Take 10 mg by mouth 3 (three) times daily as needed for migraine (dissolve orally).), Disp: 9 tablet, Rfl: 11   tirzepatide (MOUNJARO) 2.5 MG/0.5ML Pen, Inject 2.5 mg into the skin once a week. Month #1, Disp: 2 mL, Rfl: 0   tirzepatide (MOUNJARO) 5 MG/0.5ML Pen, Inject 5 mg into the skin once a week. Month #2, Disp: 2 mL, Rfl: 0   tirzepatide (MOUNJARO) 7.5 MG/0.5ML Pen, Inject 7.5 mg into the skin once a week. Month #3, Disp: 2 mL, Rfl: 1   Vibegron (GEMTESA) 75 MG TABS, Take 75 mg by mouth daily. Per urology, Disp: , Rfl:    metFORMIN (GLUCOPHAGE) 500 MG tablet, Take 1 tablet (500 mg total) by mouth at bedtime., Disp: 90 tablet, Rfl: 3   ondansetron (ZOFRAN-ODT) 4 MG disintegrating tablet, Take 1 tablet (4 mg total) by mouth every 8 (eight) hours as needed for nausea or vomiting., Disp: 20 tablet, Rfl: 0   propranolol ER (INDERAL LA) 120 MG 24 hr capsule, Take 1 capsule (120 mg total) by mouth at bedtime., Disp: 100 capsule, Rfl: 3   rosuvastatin (CRESTOR) 5 MG tablet, Take 1 tablet (5 mg total) by mouth at bedtime., Disp: 90 tablet, Rfl: 3   sertraline (ZOLOFT) 100 MG tablet, Take 1 tablet (100 mg total) by mouth daily., Disp: 180 tablet, Rfl: 0 Social History   Socioeconomic History   Marital status: Married    Spouse name: [name]   Number of children: 1   Years of education: 12   Highest education level: 12th  grade  Occupational History   Occupation: Recruitment consultant spa and tanning  Tobacco Use   Smoking status: Never   Smokeless tobacco: Never  Vaping Use   Vaping status: Never Used  Substance and Sexual Activity   Alcohol use: No   Drug use: No   Sexual activity: Yes    Birth control/protection: None  Other Topics Concern   Not on file  Social History Narrative   Lives with husband and daughter and in-laws   Daughter, Summer, with nonverbal Autism   Her spouse (who works from home) is very supportive   Caffeine use: 100-150mg /week   She drinks about 3 cups of diet caffeine soda per week   She  has been going to [facility]   Social Determinants of Health   Financial Resource Strain: Low Risk  (06/05/2023)   Overall Financial Resource Strain (CARDIA)    Difficulty of Paying Living Expenses: Not very hard  Food Insecurity: No Food Insecurity (06/05/2023)   Hunger Vital Sign    Worried About Running Out of Food in the Last Year: Never true    Ran Out of Food in the Last Year: Never true  Transportation Needs: No Transportation Needs (06/05/2023)   PRAPARE - Administrator, Civil Service (Medical): No    Lack of Transportation (Non-Medical): No  Physical Activity: Insufficiently Active (06/05/2023)   Exercise Vital Sign    Days of Exercise per Week: 2 days    Minutes of Exercise per Session: 20 min  Stress: No Stress Concern Present (06/05/2023)   [employer] of Occupational Health - Occupational Stress Questionnaire    Feeling of Stress : Only a little  Social Connections: Socially Integrated (06/05/2023)   Social Connection and Isolation Panel [NHANES]    Frequency of Communication with Friends and Family: More than three times a week    Frequency of Social Gatherings with Friends and Family: Once a week    Attends Religious Services: More than 4 times per year    Active Member of [employer] or Organizations: Yes    Attends Engineer, structural:  More than 4 times per year    Marital Status: Married  Catering manager Violence: Unknown (10/01/2021)   Received from [employer], [facility]   HITS    Physically Hurt: Not on file    Insult or Talk Down To: Not on file    Threaten Physical Harm: Not on file    Scream or Curse: Not on file   Family History  Problem Relation Age of Onset   Hypertension Mother    Obesity Mother    Diabetes Father    Kidney disease Father    Sudden death Father     Objective: Office vital signs reviewed. BP 133/85   Pulse 84   Temp 98.8 F (37.1 C)   Ht 5\' 1"  (1.549 m)   Wt 203 lb (92.1 kg)   SpO2 94%   BMI 38.36 kg/m   Physical Examination:  General: Awake, alert, morbidly obese female, No acute distress HEENT: sclera white, MMM Cardio: regular rate and rhythm, S1S2 heard, no murmurs appreciated Pulm: clear to auscultation bilaterally, no wheezes, rhonchi or rales; normal work of breathing on room air      06/06/2023    2:52 PM 02/10/2023    9:32 AM 10/10/2022    9:11 AM  Depression screen PHQ 2/9  Decreased Interest 1 1 1   Down, Depressed, Hopeless 2 2 3   PHQ - 2 Score 3 3 4   Altered sleeping 1 1 1   Tired, decreased energy 1 2 1   Change in appetite 2 2 1   Feeling bad or failure about yourself  0 0 1  Trouble concentrating 0 1 1  Moving slowly or fidgety/restless 0 0 0  Suicidal thoughts 0 0 0  PHQ-9 Score 7 9 9   Difficult doing work/chores Somewhat difficult Not difficult at all Somewhat difficult      06/06/2023    2:51 PM 02/10/2023    9:32 AM 10/10/2022    9:11 AM 08/26/2022   11:15 AM  GAD 7 : Generalized Anxiety Score  Nervous, Anxious, on Edge 1 2 1 1   Control/stop  worrying 1 0 3 0  Worry too much - different things 0 1 1 0  Trouble relaxing 1 1 1 1   Restless 1 1 1  0  Easily annoyed or irritable 1 0 0 0  Afraid - awful might happen 0 1 0 0  Total GAD 7 Score 5 6 7 2   Anxiety Difficulty  Not difficult at all Not difficult at all Not difficult at all     Assessment/ Plan: 37 y.o. female   Type 2 diabetes mellitus with microalbuminuria (HCC) - Plan: Bayer DCA Hb A1c Waived, metFORMIN (GLUCOPHAGE) 500 MG tablet, ondansetron (ZOFRAN-ODT) 4 MG disintegrating tablet, tirzepatide (MOUNJARO) 2.5 MG/0.5ML Pen, tirzepatide (MOUNJARO) 5 MG/0.5ML Pen, tirzepatide (MOUNJARO) 7.5 MG/0.5ML Pen, empagliflozin (JARDIANCE) 10 MG TABS tablet  Long-term current use of injectable noninsulin antidiabetic medication  Morbid obesity (HCC)  Hyperlipidemia associated with type 2 diabetes mellitus (HCC) - Plan: rosuvastatin (CRESTOR) 5 MG tablet  Migraine without aura and without status migrainosus, not intractable - Plan: propranolol ER (INDERAL LA) 120 MG 24 hr capsule  Generalized anxiety disorder - Plan: sertraline (ZOLOFT) 100 MG tablet  Moderate episode of recurrent major depressive disorder (HCC) - Plan: sertraline (ZOLOFT) 100 MG tablet  Encounter for immunization - Plan: Flu vaccine trivalent PF, 6mos and older(Flulaval,Afluria,Fluarix,Fluzone)   Not meeting clinical goals with Ozempic, Farxiga sample and metformin.  She is maxed out on Ozempic at 2 mg.  A1c 8.3.  Will have her start Mounjaro 2.5 mg and escalate to 7.5 mg of the course of 3 months.  Marcelline Deist not covered by Medicaid so we will send in low-dose Jardiance to start and plan to advance to 25 mg at next visit.  This should hopefully help reduce microalbuminuria  Weight loss recommended.  Still has phentermine on hand if needed for weight loss assistance  Rosuvastatin renewed.  Not due for fasting lipid  Propranolol renewed for migraine prevention  Did not discuss migraine, anxiety or depression but medications have all been renewed  Influenza vaccination administered  Raliegh Ip, DO [facility] [phone]

## 2023-06-16 ENCOUNTER — Encounter: Payer: Self-pay | Admitting: Family Medicine

## 2023-06-19 ENCOUNTER — Other Ambulatory Visit: Payer: Self-pay | Admitting: Family Medicine

## 2023-06-19 ENCOUNTER — Telehealth: Payer: Self-pay

## 2023-06-19 DIAGNOSIS — Z7985 Long-term (current) use of injectable non-insulin antidiabetic drugs: Secondary | ICD-10-CM

## 2023-06-19 DIAGNOSIS — R809 Proteinuria, unspecified: Secondary | ICD-10-CM

## 2023-06-19 MED ORDER — TRULICITY 0.75 MG/0.5ML ~~LOC~~ SOAJ: 0.7500 mg | SUBCUTANEOUS | 0 refills | Status: DC

## 2023-06-19 NOTE — Telephone Encounter (Signed)
Pharmacy Patient Advocate Encounter   Received notification from Patient Advice Request messages that prior authorization for Mounjaro 2.5MG /0.5ML auto-injectors is required/requested.   Insurance verification completed.   The patient is insured through [facility] .   Per test claim: PA required; PA submitted to above mentioned insurance via CoverMyMeds Key/confirmation #/EOC [id number] Status is pending

## 2023-06-19 NOTE — Telephone Encounter (Signed)
Please inform patient that her insurance will not cover this without her trying 1 other injectable.  I am going to send over Trulicity.  We can retry the [facility] following that trial

## 2023-06-19 NOTE — Telephone Encounter (Signed)
Pharmacy Patient Advocate Encounter  Received notification from [facility] that Prior Authorization for [facility] 2.5MG /0.5ML auto-injectors  has been DENIED.  See denial reason below. No denial letter attached in CMM. Will attach denial letter to Media tab once received.   PA #/Case ID/Reference #: [id number]   DENIAL REASON: Patient has not tried and failed 2 preferred drugs. Preferred drugs: Byetta pen, Trulicity pen, Victoza pen, Ozempic.       Marland Kitchen

## 2023-06-19 NOTE — Telephone Encounter (Signed)
Pt has been notified.

## 2023-06-25 DIAGNOSIS — R051 Acute cough: Secondary | ICD-10-CM | POA: Diagnosis not present

## 2023-06-25 DIAGNOSIS — Z87898 Personal history of other specified conditions: Secondary | ICD-10-CM | POA: Diagnosis not present

## 2023-06-30 ENCOUNTER — Other Ambulatory Visit (HOSPITAL_COMMUNITY): Payer: Self-pay

## 2023-07-06 DIAGNOSIS — N200 Calculus of kidney: Secondary | ICD-10-CM | POA: Diagnosis not present

## 2023-07-06 DIAGNOSIS — N1330 Unspecified hydronephrosis: Secondary | ICD-10-CM | POA: Diagnosis not present

## 2023-07-06 DIAGNOSIS — R8271 Bacteriuria: Secondary | ICD-10-CM | POA: Diagnosis not present

## 2023-07-06 DIAGNOSIS — N3946 Mixed incontinence: Secondary | ICD-10-CM | POA: Diagnosis not present

## 2023-07-20 ENCOUNTER — Other Ambulatory Visit (HOSPITAL_COMMUNITY): Payer: Self-pay

## 2023-07-20 ENCOUNTER — Telehealth: Payer: Self-pay | Admitting: Pharmacy Technician

## 2023-07-20 NOTE — Telephone Encounter (Signed)
Pharmacy Patient Advocate Encounter   Received notification from CoverMyMeds that prior authorization for Jardiance 10MG  tablets is required/requested.   Insurance verification completed.   The patient is insured through [employer] .   Per test claim: The current 90 day co-pay is, $4.00.  No PA needed at this time. This test claim was processed through [facility]- copay amounts may vary at other pharmacies due to pharmacy/plan contracts, or as the patient moves through the different stages of their insurance plan.

## 2023-07-28 DIAGNOSIS — J020 Streptococcal pharyngitis: Secondary | ICD-10-CM | POA: Diagnosis not present

## 2023-07-28 DIAGNOSIS — R062 Wheezing: Secondary | ICD-10-CM | POA: Diagnosis not present

## 2023-08-12 ENCOUNTER — Encounter (INDEPENDENT_AMBULATORY_CARE_PROVIDER_SITE_OTHER): Payer: Self-pay | Admitting: Family Medicine

## 2023-08-12 DIAGNOSIS — F411 Generalized anxiety disorder: Secondary | ICD-10-CM | POA: Diagnosis not present

## 2023-08-14 MED ORDER — BUSPIRONE HCL 7.5 MG PO TABS: 3.7500 mg | ORAL_TABLET | Freq: Two times a day (BID) | ORAL | 1 refills | Status: DC

## 2023-08-14 NOTE — Telephone Encounter (Signed)
 Please see the MyChart message reply(ies) for my assessment and plan.    This patient gave consent for this Medical Advice Message and is aware that it may result in a bill to [employer], as well as the possibility of receiving a bill for a co-payment or deductible. They are an established patient, but are not seeking medical advice exclusively about a problem treated during an in person or video visit in the last seven days. I did not recommend an in person or video visit within seven days of my reply.    I spent a total of 5 minutes cumulative time within 7 days through [employer].  Delynn Flavin, DO

## 2023-08-31 DIAGNOSIS — J020 Streptococcal pharyngitis: Secondary | ICD-10-CM | POA: Diagnosis not present

## 2023-08-31 DIAGNOSIS — U071 COVID-19: Secondary | ICD-10-CM | POA: Diagnosis not present

## 2023-08-31 DIAGNOSIS — R051 Acute cough: Secondary | ICD-10-CM | POA: Diagnosis not present

## 2023-09-15 ENCOUNTER — Ambulatory Visit: Payer: Medicaid Other | Admitting: Family Medicine

## 2023-09-15 VITALS — BP 156/87 | HR 88 | Temp 97.9°F | Ht 61.0 in | Wt 205.4 lb

## 2023-09-15 DIAGNOSIS — R809 Proteinuria, unspecified: Secondary | ICD-10-CM

## 2023-09-15 DIAGNOSIS — F331 Major depressive disorder, recurrent, moderate: Secondary | ICD-10-CM

## 2023-09-15 DIAGNOSIS — E1129 Type 2 diabetes mellitus with other diabetic kidney complication: Secondary | ICD-10-CM

## 2023-09-15 DIAGNOSIS — F411 Generalized anxiety disorder: Secondary | ICD-10-CM

## 2023-09-15 DIAGNOSIS — Z7985 Long-term (current) use of injectable non-insulin antidiabetic drugs: Secondary | ICD-10-CM | POA: Insufficient documentation

## 2023-09-15 DIAGNOSIS — E1169 Type 2 diabetes mellitus with other specified complication: Secondary | ICD-10-CM | POA: Diagnosis not present

## 2023-09-15 DIAGNOSIS — M7989 Other specified soft tissue disorders: Secondary | ICD-10-CM

## 2023-09-15 DIAGNOSIS — Z6838 Body mass index (BMI) 38.0-38.9, adult: Secondary | ICD-10-CM

## 2023-09-15 DIAGNOSIS — E785 Hyperlipidemia, unspecified: Secondary | ICD-10-CM | POA: Diagnosis not present

## 2023-09-15 LAB — BAYER DCA HB A1C WAIVED: HB A1C (BAYER DCA - WAIVED): 8.5 % — ABNORMAL HIGH (ref 4.8–5.6)

## 2023-09-15 MED ORDER — TIRZEPATIDE 5 MG/0.5ML ~~LOC~~ SOAJ: 5.0000 mg | SUBCUTANEOUS | 0 refills | Status: DC

## 2023-09-15 MED ORDER — TIRZEPATIDE 7.5 MG/0.5ML ~~LOC~~ SOAJ: 7.5000 mg | SUBCUTANEOUS | 1 refills | Status: DC

## 2023-09-15 MED ORDER — TIRZEPATIDE 2.5 MG/0.5ML ~~LOC~~ SOAJ: 2.5000 mg | SUBCUTANEOUS | 0 refills | Status: DC

## 2023-09-15 MED ORDER — SERTRALINE HCL 100 MG PO TABS: 100.0000 mg | ORAL_TABLET | Freq: Every day | ORAL | 3 refills | Status: DC

## 2023-09-15 MED ORDER — EMPAGLIFLOZIN 10 MG PO TABS: 10.0000 mg | ORAL_TABLET | Freq: Every day | ORAL | 0 refills | Status: DC

## 2023-09-15 NOTE — Progress Notes (Signed)
 Subjective: CC:DM PCP: Raliegh Ip, DO Lauren Cardenas is a 38 y.o. female presenting to clinic today for:  1. Type 2 Diabetes with hypertension, hyperlipidemia:  Patient has not been compliant with any of her medication because she "is not great at remembering her medications".  She has not been monitoring her blood sugars.  She has not really been following any specific diet or medical advice for the last several months.  She does admit to polydipsia, polyuria, some blurred vision.  No chest pain, shortness of breath.  She has been experiencing some ankle edema lately.  She never did start a Mounjaro because she "was never called to come pick it up  Diabetes Health Maintenance Due  Topic Date Due   FOOT EXAM  10/10/2023   HEMOGLOBIN A1C  12/05/2023   OPHTHALMOLOGY EXAM  03/08/2024    Last A1c:  Lab Results  Component Value Date   HGBA1C 8.3 (H) 06/06/2023     ROS: Per HPI  Allergies  Allergen Reactions   Amoxicillin Hives and Other (See Comments)    Got ceftriaxone in the past Has patient had a PCN reaction causing immediate rash, facial/tongue/throat swelling, SOB or lightheadedness with hypotension: Yes Has patient had a PCN reaction causing severe rash involving mucus membranes or skin necrosis: No Has patient had a PCN reaction that required hospitalization: No Has patient had a PCN reaction occurring within the last 10 years: Yes If all of the above answers are "NO", then may proceed with Cephalosporin use.   Sudafed [Pseudoephedrine Hcl] Shortness Of Breath   Past Medical History:  Diagnosis Date   Acute pyelonephritis 09/27/2015   Chronic migraine    neurologist-  dr Lucia Gaskins   Depression 05/30/2017   with emotional eating   Diabetes mellitus without complication (HCC)    DM type 2   Dysuria    GERD (gastroesophageal reflux disease)    History of acute pyelonephritis    09-27-2015:   11-04-2017  w/ sepsis due to obstruction from kidney stone    History of COVID-19 07/02/2020   mild symptoms x few days all symptoms resolved   History of kidney stones    IC (interstitial cystitis)    Left ureteral stone    OAB (overactive bladder)    Urge urinary incontinence    Vesicoureteral reflux     Current Outpatient Medications:    acetaminophen (TYLENOL) 500 MG tablet, Take 1 tablet (500 mg total) by mouth every 6 (six) hours as needed. (Patient taking differently: Take 500 mg by mouth every 6 (six) hours as needed for mild pain (pain score 1-3).), Disp: 30 tablet, Rfl: 0   albuterol (VENTOLIN HFA) 108 (90 Base) MCG/ACT inhaler, Inhale 2 puffs into the lungs every 6 (six) hours as needed for wheezing or shortness of breath., Disp: 6.7 g, Rfl: 1   blood glucose meter kit and supplies, Dispense based on patient and insurance preference. Use up to four times daily as directed. (FOR ICD-10 E10.9, E11.9)., Disp: 1 each, Rfl: 0   busPIRone (BUSPAR) 7.5 MG tablet, Take 0.5-1 tablets (3.75-7.5 mg total) by mouth 2 (two) times daily. For anxiety, Disp: 60 tablet, Rfl: 1   cetirizine (ZYRTEC) 10 MG chewable tablet, Chew 1 tablet (10 mg total) by mouth daily., Disp: 90 tablet, Rfl: 3   Cholecalciferol (VITAMIN D-3) 25 MCG (1000 UT) CAPS, Take 1,000 Units by mouth daily with lunch., Disp: , Rfl:    Dulaglutide (TRULICITY) 0.75 MG/0.5ML SOAJ, Inject 0.75 mg  into the skin once a week., Disp: 2 mL, Rfl: 0   empagliflozin (JARDIANCE) 10 MG TABS tablet, Take 1 tablet (10 mg total) by mouth daily before breakfast. For sugar control., Disp: 90 tablet, Rfl: 0   famotidine (PEPCID) 20 MG tablet, Take 1 tablet (20 mg total) by mouth daily. For acid reflux, Disp: 100 tablet, Rfl: 3   fluticasone (FLONASE) 50 MCG/ACT nasal spray, Place 2 sprays into both nostrils daily as needed for allergies or rhinitis., Disp: 16 g, Rfl: PRN   fluticasone (FLOVENT HFA) 44 MCG/ACT inhaler, Inhale 1 puff into the lungs 2 (two) times daily., Disp: 31.8 each, Rfl: 3   glucose blood test  strip, Use as instructed once daily as directed for sugar monitoring E11.9, Disp: 100 each, Rfl: 3   Lancets MISC, UAD to check BGs once daily as directed E11.9, Disp: 100 each, Rfl: 3   metFORMIN (GLUCOPHAGE) 500 MG tablet, Take 1 tablet (500 mg total) by mouth at bedtime., Disp: 90 tablet, Rfl: 3   Meth-Hyo-M Bl-Na Phos-Ph Sal (URIBEL) 118 MG CAPS, Take 1 capsule by mouth daily as needed (for urinary pain/discomfort)., Disp: , Rfl:    OMEGA-3 FATTY ACIDS PO, Take 1,000 mg by mouth daily., Disp: , Rfl:    ondansetron (ZOFRAN-ODT) 4 MG disintegrating tablet, Take 1 tablet (4 mg total) by mouth every 8 (eight) hours as needed for nausea or vomiting., Disp: 20 tablet, Rfl: 0   phentermine (ADIPEX-P) 37.5 MG tablet, Take 0.5-1 tablets (18.75-37.5 mg total) by mouth daily before breakfast. X30 days.  Then take 1/2 tablet daily x60 days. Then stop., Disp: 60 tablet, Rfl: 0   propranolol ER (INDERAL LA) 120 MG 24 hr capsule, Take 1 capsule (120 mg total) by mouth at bedtime., Disp: 100 capsule, Rfl: 3   rizatriptan (MAXALT-MLT) 10 MG disintegrating tablet, Take 1 tablet (10 mg total) by mouth 3 (three) times daily as needed for migraine. (Patient taking differently: Take 10 mg by mouth 3 (three) times daily as needed for migraine (dissolve orally).), Disp: 9 tablet, Rfl: 11   rosuvastatin (CRESTOR) 5 MG tablet, Take 1 tablet (5 mg total) by mouth at bedtime., Disp: 90 tablet, Rfl: 3   sertraline (ZOLOFT) 100 MG tablet, Take 1 tablet (100 mg total) by mouth daily., Disp: 180 tablet, Rfl: 0   tirzepatide (MOUNJARO) 2.5 MG/0.5ML Pen, Inject 2.5 mg into the skin once a week. Month #1, Disp: 2 mL, Rfl: 0   tirzepatide (MOUNJARO) 5 MG/0.5ML Pen, Inject 5 mg into the skin once a week. Month #2, Disp: 2 mL, Rfl: 0   tirzepatide (MOUNJARO) 7.5 MG/0.5ML Pen, Inject 7.5 mg into the skin once a week. Month #3, Disp: 2 mL, Rfl: 1   Vibegron (GEMTESA) 75 MG TABS, Take 75 mg by mouth daily. Per urology, Disp: , Rfl:   Social History   Socioeconomic History   Marital status: Married    Spouse name: [name]   Number of children: 1   Years of education: 12   Highest education level: 12th grade  Occupational History   Occupation: [employer]  Tobacco Use   Smoking status: Never   Smokeless tobacco: Never  Vaping Use   Vaping status: Never Used  Substance and Sexual Activity   Alcohol use: No   Drug use: No   Sexual activity: Yes    Birth control/protection: None  Other Topics Concern   Not on file  Social History Narrative   Lives with husband and daughter  and in-laws   Daughter, Lauren Cardenas, with nonverbal Autism   Her spouse (who works from home) is very supportive   Caffeine use: 100-150mg /week   She drinks about 3 cups of diet caffeine soda per week   She has been going to [facility]   Social Drivers of Health   Financial Resource Strain: Low Risk  (09/15/2023)   Overall Financial Resource Strain (CARDIA)    Difficulty of Paying Living Expenses: Not very hard  Food Insecurity: No Food Insecurity (09/15/2023)   Hunger Vital Sign    Worried About Running Out of Food in the Last Year: Never true    Ran Out of Food in the Last Year: Never true  Transportation Needs: No Transportation Needs (09/15/2023)   PRAPARE - Administrator, Civil Service (Medical): No    Lack of Transportation (Non-Medical): No  Physical Activity: Inactive (09/15/2023)   Exercise Vital Sign    Days of Exercise per Week: 0 days    Minutes of Exercise per Session: 20 min  Stress: Stress Concern Present (09/15/2023)   [employer] of Occupational Health - Occupational Stress Questionnaire    Feeling of Stress : To some extent  Social Connections: Socially Integrated (09/15/2023)   Social Connection and Isolation Panel [NHANES]    Frequency of Communication with Friends and Family: More than three times a week    Frequency of Social Gatherings with Friends and Family: More  than three times a week    Attends Religious Services: More than 4 times per year    Active Member of [employer] or Organizations: Yes    Attends Banker Meetings: Never    Marital Status: Married  Catering manager Violence: Unknown (10/01/2021)   Received from [employer], [facility]   HITS    Physically Hurt: Not on file    Insult or Talk Down To: Not on file    Threaten Physical Harm: Not on file    Scream or Curse: Not on file   Family History  Problem Relation Age of Onset   Hypertension Mother    Obesity Mother    Diabetes Father    Kidney disease Father    Sudden death Father     Objective: Office vital signs reviewed. BP (!) 156/87   Pulse 88   Temp 97.9 F (36.6 C) (Temporal)   Ht 5\' 1"  (1.549 m)   Wt 205 lb 6.4 oz (93.2 kg)   SpO2 95%   BMI 38.81 kg/m   Physical Examination:  General: Awake, alert, obese, No acute distress HEENT: Sclera white.  Moist mucous membranes Cardio: regular rate and rhythm, S1S2 heard, no murmurs appreciated Pulm: clear to auscultation bilaterally, no wheezes, rhonchi or rales; normal work of breathing on room air Extremities: Trace ankle edema present bilaterally  Assessment/ Plan: 38 y.o. female   Type 2 diabetes mellitus with microalbuminuria (HCC) - Plan: Bayer DCA Hb A1c Waived, CMP14+EGFR, Microalbumin / creatinine urine ratio, tirzepatide (MOUNJARO) 2.5 MG/0.5ML Pen, tirzepatide (MOUNJARO) 5 MG/0.5ML Pen, tirzepatide (MOUNJARO) 7.5 MG/0.5ML Pen, empagliflozin (JARDIANCE) 10 MG TABS tablet  Long-term current use of injectable noninsulin antidiabetic medication - Plan: CMP14+EGFR  Bilateral swelling of feet - Plan: TSH + free T4  Morbid obesity (HCC) - Plan: CMP14+EGFR  BMI 38.0-38.9,adult - Plan: CMP14+EGFR  Hyperlipidemia associated with type 2 diabetes mellitus (HCC) - Plan: CMP14+EGFR  Generalized anxiety disorder - Plan: sertraline (ZOLOFT) 100 MG tablet  Moderate episode of recurrent major depressive  disorder (HCC) - Plan: sertraline (ZOLOFT) 100 MG tablet  A1c rising.  She is to go to the pharmacy and pick up her medications ASAP.  I counseled her at length on being compliant with her medication and the risk of stroke and heart attack that she poses to herself should she decide not to be compliant with her regimen.  I suspect blood pressure is elevated secondary to again noncompliance with diet and medications.  I have advised her to follow-up with me in 3 months for recheck, sooner if concerns arise.  In the meantime, check renal function, liver enzymes, electrolytes and urine microalbumin.  TSH and free T4 also added given swelling in the feet  Huey Scalia Hulen Skains, DO [facility] [phone]

## 2023-09-16 LAB — CMP14+EGFR
ALT: 28 IU/L (ref 0–32)
AST: 28 IU/L (ref 0–40)
Albumin: 4.2 g/dL (ref 3.9–4.9)
Alkaline Phosphatase: 86 IU/L (ref 44–121)
BUN/Creatinine Ratio: 18 (ref 9–23)
BUN: 12 mg/dL (ref 6–20)
Bilirubin Total: 0.3 mg/dL (ref 0.0–1.2)
CO2: 24 mmol/L (ref 20–29)
Calcium: 9.6 mg/dL (ref 8.7–10.2)
Chloride: 99 mmol/L (ref 96–106)
Creatinine, Ser: 0.68 mg/dL (ref 0.57–1.00)
Globulin, Total: 2.9 g/dL (ref 1.5–4.5)
Glucose: 115 mg/dL — ABNORMAL HIGH (ref 70–99)
Potassium: 4.4 mmol/L (ref 3.5–5.2)
Sodium: 139 mmol/L (ref 134–144)
Total Protein: 7.1 g/dL (ref 6.0–8.5)
eGFR: 114 mL/min/{1.73_m2} (ref >59)

## 2023-09-16 LAB — TSH+FREE T4
Free T4: 1.38 ng/dL (ref 0.82–1.77)
TSH: 2.05 u[IU]/mL (ref 0.450–4.500)

## 2023-09-17 LAB — MICROALBUMIN / CREATININE URINE RATIO
Creatinine, Urine: 84.1 mg/dL
Microalb/Creat Ratio: 400 mg/g{creat} — ABNORMAL HIGH (ref 0–29)
Microalbumin, Urine: 336.2 ug/mL

## 2023-09-18 ENCOUNTER — Encounter: Payer: Self-pay | Admitting: Family Medicine

## 2023-09-18 ENCOUNTER — Other Ambulatory Visit (HOSPITAL_COMMUNITY): Payer: Self-pay

## 2023-09-18 ENCOUNTER — Other Ambulatory Visit: Payer: Self-pay | Admitting: Family Medicine

## 2023-09-18 ENCOUNTER — Telehealth: Payer: Self-pay | Admitting: Pharmacy Technician

## 2023-09-18 DIAGNOSIS — E1129 Type 2 diabetes mellitus with other diabetic kidney complication: Secondary | ICD-10-CM

## 2023-09-18 NOTE — Telephone Encounter (Signed)
 Pharmacy Patient Advocate Encounter   Received notification from CoverMyMeds that prior authorization for Mounjaro 2.5MG /0.5ML is required/requested.   Insurance verification completed.   The patient is insured through [facility] .   Per test claim: [id number] Submitted and pending

## 2023-09-19 ENCOUNTER — Other Ambulatory Visit (HOSPITAL_COMMUNITY): Payer: Self-pay

## 2023-09-19 NOTE — Telephone Encounter (Signed)
 Pharmacy Patient Advocate Encounter  Received notification from [facility] that Prior Authorization for [facility] 2.5MG  has been APPROVED from 09/18/23 to 09/17/24. Ran test claim, Copay is $4.00. This test claim was processed through [facility] copay amounts may vary at other pharmacies due to pharmacy/plan contracts, or as the patient moves through the different stages of their insurance plan.   PA #/Case ID/Reference #: [id number]

## 2023-09-21 ENCOUNTER — Encounter: Payer: Self-pay | Admitting: Family Medicine

## 2023-09-22 ENCOUNTER — Telehealth: Payer: Self-pay

## 2023-09-22 ENCOUNTER — Other Ambulatory Visit (HOSPITAL_COMMUNITY): Payer: Self-pay

## 2023-09-22 IMAGING — CT CT RENAL STONE PROTOCOL
2 of 4 series · 16 of 46 positions shown, 18 images · non-contrast
Comparison: 03/18/2021

CLINICAL DATA: Flank pain, recent left ureteral stent placement,
hematuria

EXAM:
CT ABDOMEN AND PELVIS WITHOUT CONTRAST
TECHNIQUE: Multidetector CT imaging of the abdomen and pelvis was performed
following the standard protocol without IV contrast.

[Series 2: axial st · axial · 0.85mm/px · z∈[-501,-81]mm · 13 of 96 slices shown, 15 images]
[im 6/96  soft-tissue]
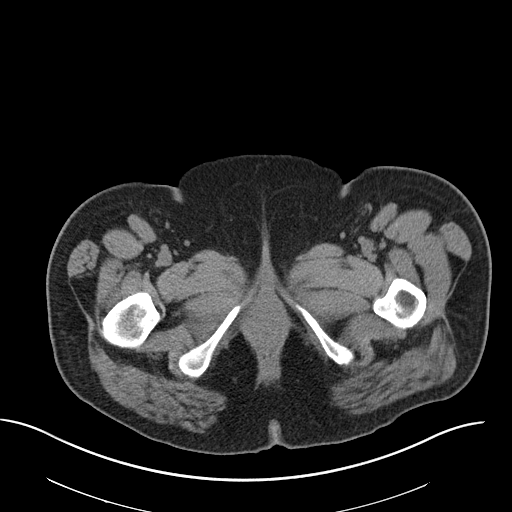
[im 6/96  bone]
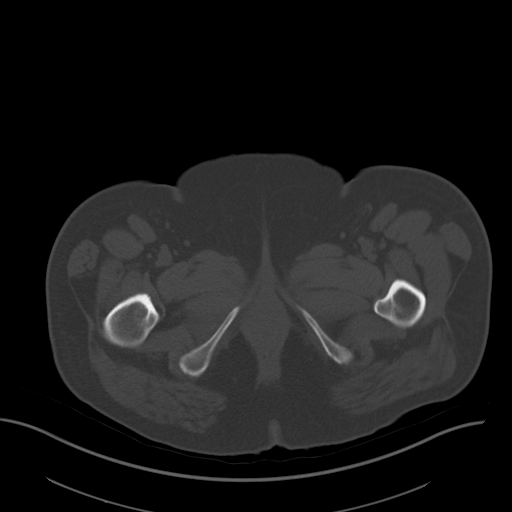
[im 12/96  soft-tissue]
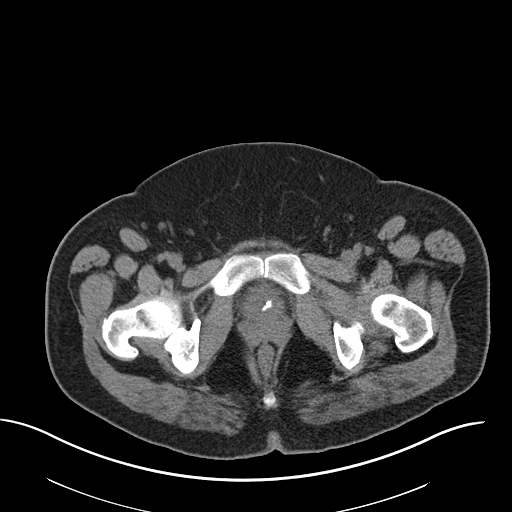
[im 23/96  soft-tissue]
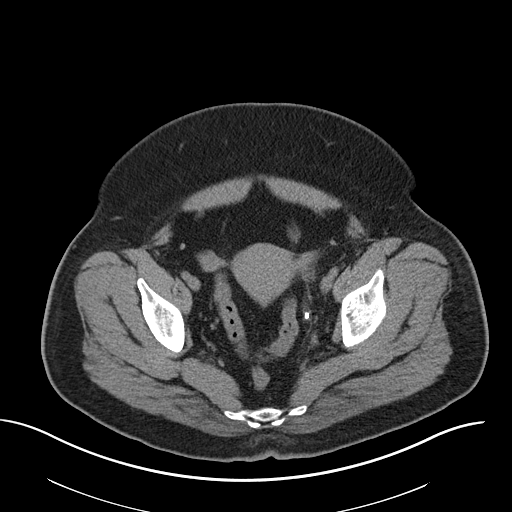
[im 28/96  soft-tissue]
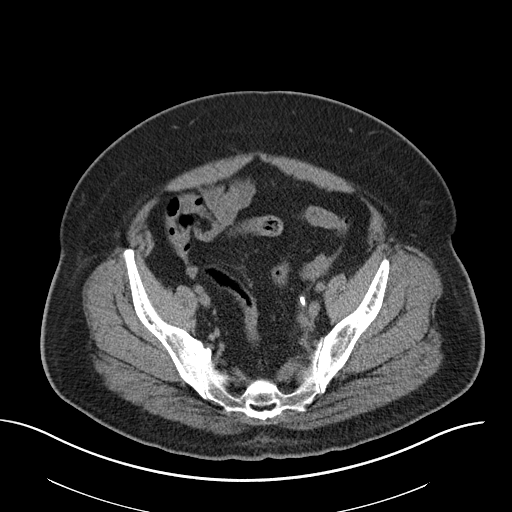
[im 34/96  soft-tissue]
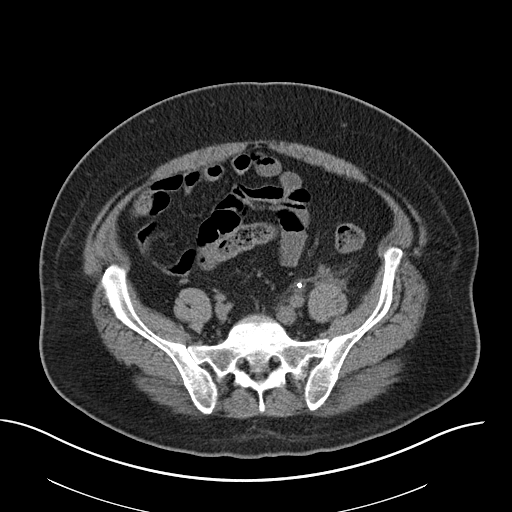
[im 40/96  soft-tissue]
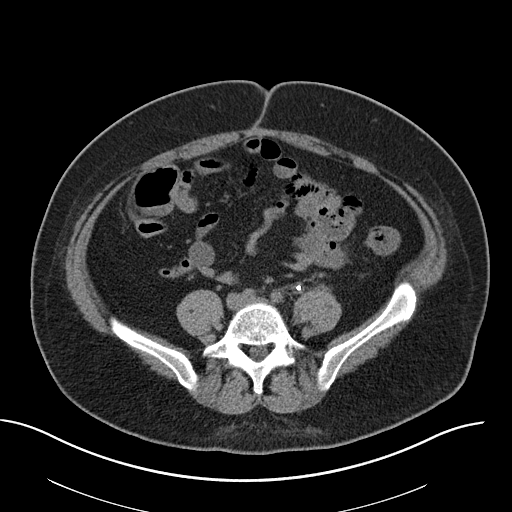
[im 51/96  soft-tissue]
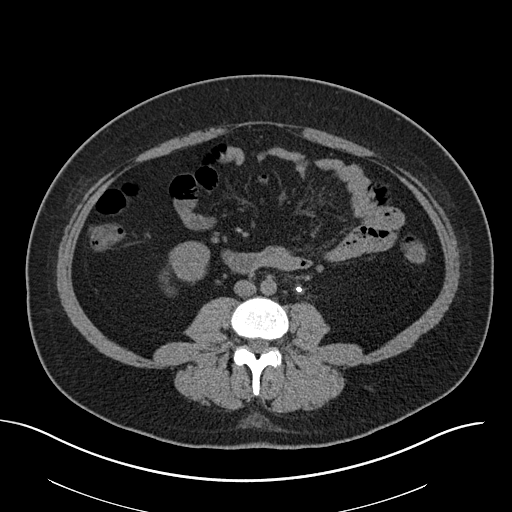
[im 56/96  soft-tissue]
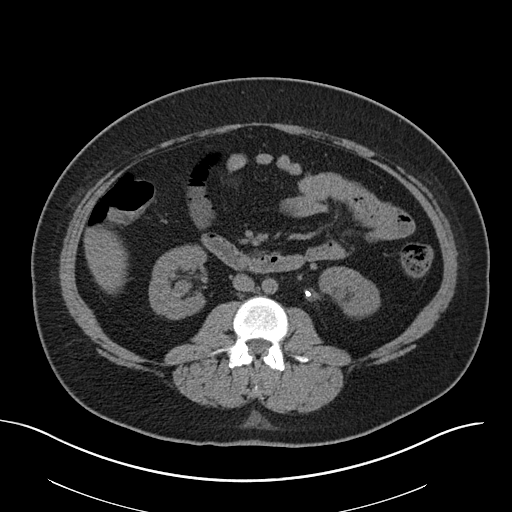
[im 62/96  soft-tissue]
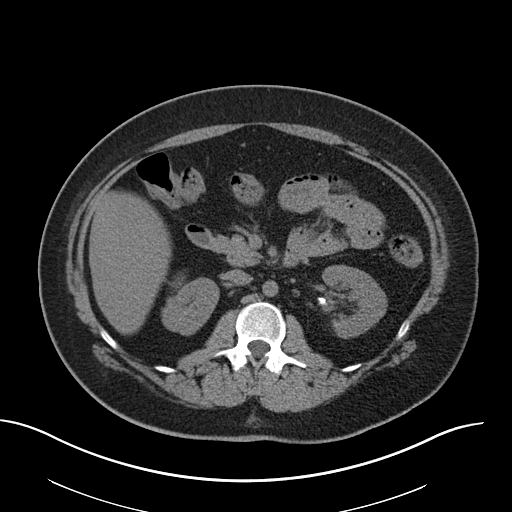
[im 62/96  bone]
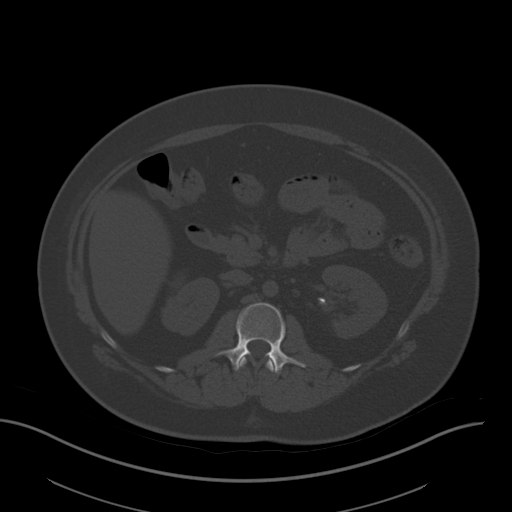
[im 68/96  soft-tissue]
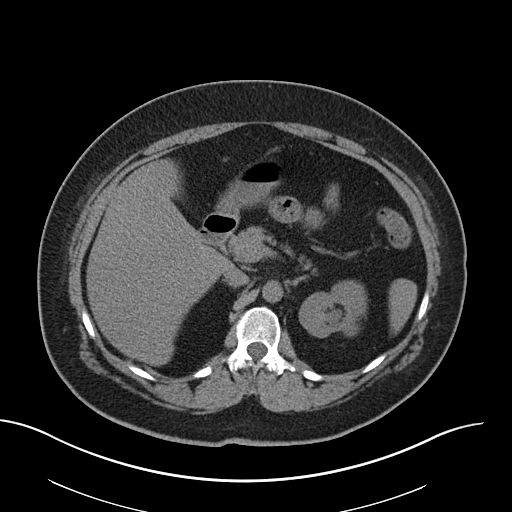
[im 73/96  soft-tissue]
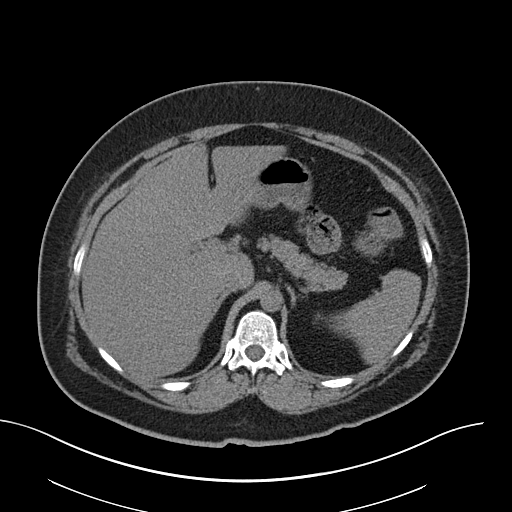
[im 84/96  soft-tissue]
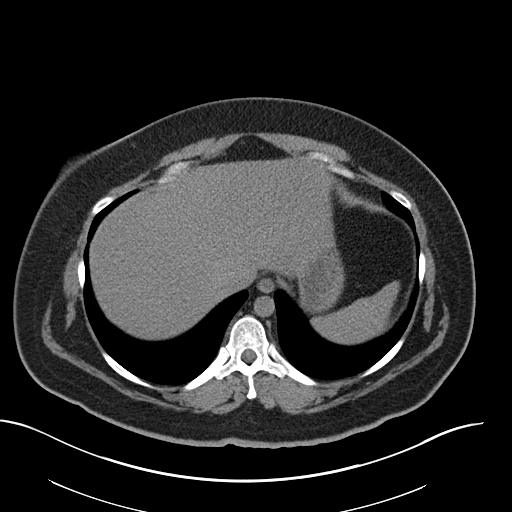
[im 90/96  soft-tissue]
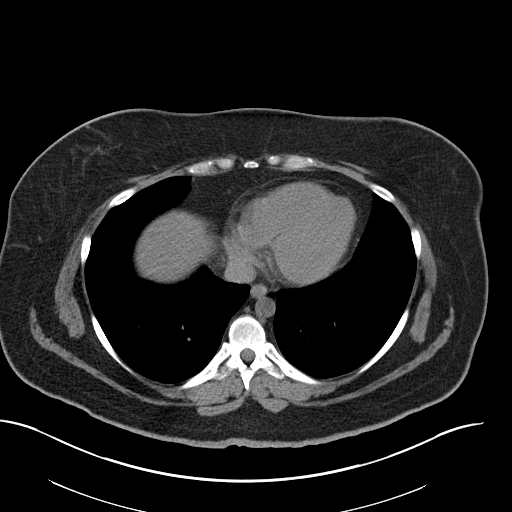

[Series 5: coronal · coronal · 0.87mm/px · 3 of 181 slices shown]
[im 61/181  soft-tissue]
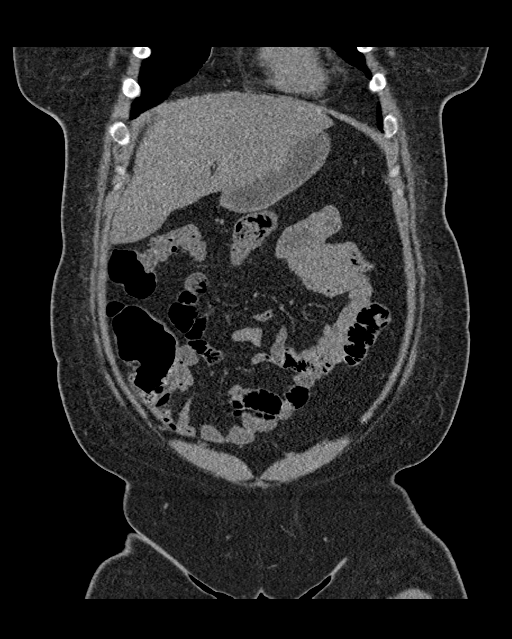
[im 81/181  soft-tissue]
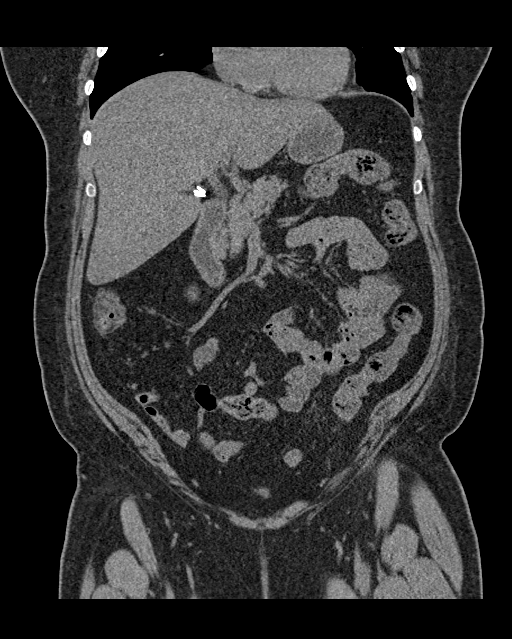
[im 101/181  soft-tissue]
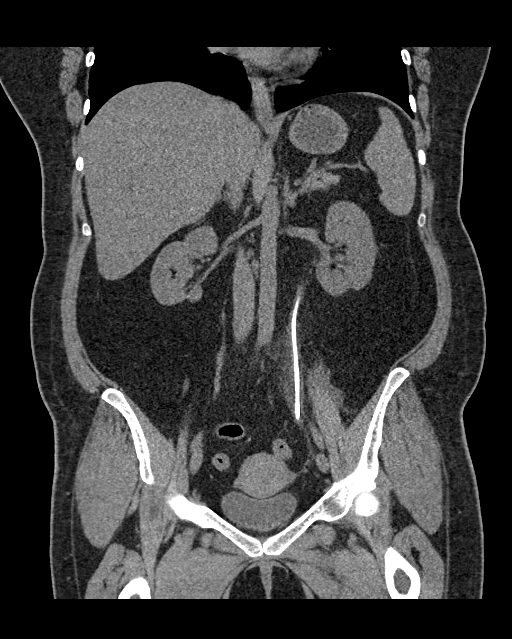

[16 of 46 positions shown; findings below may reference images not displayed]

FINDINGS: Lower chest: Lung bases are clear.

Hepatobiliary: Mild hepatic steatosis.

Status post cholecystectomy. No intrahepatic or extrahepatic ductal
dilatation.

Pancreas: Within normal limits.

Spleen: Within normal limits.

Adrenals/Urinary Tract: Adrenal glands are within normal limits.

Bilateral renal cortical scarring. 2 mm nonobstructing left upper
pole renal calculus (series 2/image 34). 3 mm nonobstructing right
upper pole renal calculus (series 2/image 38). Left double-pigtail
ureteral stent in satisfactory position. No hydronephrosis. Prior
proximal ureteral calculus is no longer visualized.

Bladder is within normal limits.

Stomach/Bowel: Stomach is within normal limits.

No evidence of bowel obstruction.

Normal appendix (series 2/image 59).

No colonic wall thickening or inflammatory changes.

Vascular/Lymphatic: No evidence of abdominal aortic aneurysm.

No suspicious abdominopelvic lymphadenopathy.

Reproductive: Uterus is within normal limits.

Bilateral ovaries are within normal limits.

Other: No abdominopelvic ascites.

Musculoskeletal: Visualized osseous structures are within normal
limits.
IMPRESSION: Left double-pigtail ureteral stent in satisfactory position. No
hydronephrosis. Prior proximal ureteral calculus is no longer
visualized.

Bilateral nonobstructing renal calculi measuring 2-3 mm.

Mild hepatic steatosis.

## 2023-09-22 NOTE — Telephone Encounter (Signed)
 Ran test claims for both meds and they should be covered for $4.00 each, thank you!

## 2023-09-22 NOTE — Telephone Encounter (Signed)
 Pharmacy Patient Advocate Encounter   Received notification from Patient Advice Request messages that prior authorization for Jardiance/Mounjaro is required/requested.   Insurance verification completed.   The patient is insured through [facility] .   Per test claim:   Mounjaro:The current 28 day co-pay is, $4.00.  No PA needed at this time. This test claim was processed through [facility]- copay amounts may vary at other pharmacies due to pharmacy/plan contracts, or as the patient moves through the different stages of their insurance plan.     Jardiance: The current 30 day co-pay is, $4.00.  No PA needed at this time. This test claim was processed through [facility]- copay amounts may vary at other pharmacies due to pharmacy/plan contracts, or as the patient moves through the different stages of their insurance plan.

## 2023-10-19 DIAGNOSIS — R300 Dysuria: Secondary | ICD-10-CM | POA: Diagnosis not present

## 2023-11-09 DIAGNOSIS — Z87442 Personal history of urinary calculi: Secondary | ICD-10-CM | POA: Diagnosis not present

## 2023-11-09 DIAGNOSIS — N3020 Other chronic cystitis without hematuria: Secondary | ICD-10-CM | POA: Diagnosis not present

## 2023-11-09 DIAGNOSIS — N3946 Mixed incontinence: Secondary | ICD-10-CM | POA: Diagnosis not present

## 2023-11-09 DIAGNOSIS — R8271 Bacteriuria: Secondary | ICD-10-CM | POA: Diagnosis not present

## 2023-12-18 ENCOUNTER — Encounter: Payer: Self-pay | Admitting: Family Medicine

## 2023-12-18 ENCOUNTER — Ambulatory Visit: Admitting: Family Medicine

## 2023-12-18 VITALS — BP 123/80 | HR 74 | Temp 98.3°F | Ht 61.0 in | Wt 201.0 lb

## 2023-12-18 DIAGNOSIS — E1129 Type 2 diabetes mellitus with other diabetic kidney complication: Secondary | ICD-10-CM | POA: Diagnosis not present

## 2023-12-18 DIAGNOSIS — Z7985 Long-term (current) use of injectable non-insulin antidiabetic drugs: Secondary | ICD-10-CM | POA: Diagnosis not present

## 2023-12-18 DIAGNOSIS — R809 Proteinuria, unspecified: Secondary | ICD-10-CM | POA: Diagnosis not present

## 2023-12-18 MED ORDER — TIRZEPATIDE 7.5 MG/0.5ML ~~LOC~~ SOAJ: 7.5000 mg | SUBCUTANEOUS | 1 refills | Status: DC

## 2023-12-18 MED ORDER — EMPAGLIFLOZIN 10 MG PO TABS: 10.0000 mg | ORAL_TABLET | Freq: Every day | ORAL | 3 refills | Status: DC

## 2023-12-18 MED ORDER — TIRZEPATIDE 2.5 MG/0.5ML ~~LOC~~ SOAJ
2.5000 mg | SUBCUTANEOUS | 0 refills | Status: DC
Start: 2023-12-18 — End: 2024-04-16

## 2023-12-18 MED ORDER — TIRZEPATIDE 5 MG/0.5ML ~~LOC~~ SOAJ
5.0000 mg | SUBCUTANEOUS | 0 refills | Status: DC
Start: 2023-12-18 — End: 2024-04-16

## 2023-12-18 NOTE — Progress Notes (Signed)
 Subjective: CC:DM PCP: [name], DO YEP:Lauren Cardenas is a 38 y.o. female presenting to clinic today for:  1. Type 2 Diabetes with microalbuminuria:  At last visit she had admitted to not being compliant with any of her medications and she subsequently restarted everything.  She reports to me today that blood sugars have been not monitored because she does not check them.  She is still intermittently compliant only with metformin  and reports that despite her getting report that Jardiance  and Mounjaro  were covered and approved through [employer] she has not been able to actually get them from her pharmacy.  She has apparently called multiple times and they have not had anything for her  Diabetes Health Maintenance Due  Topic Date Due   OPHTHALMOLOGY EXAM  03/08/2024   HEMOGLOBIN A1C  03/17/2024   FOOT EXAM  12/17/2024    Last A1c:  Lab Results  Component Value Date   HGBA1C 8.5 (H) 09/15/2023    ROS: Denies dizziness, LOC, polyuria, polydipsia, unintended weight loss/gain, foot ulcerations, numbness or tingling in extremities, shortness of breath or chest pain.   ROS: Per HPI  Allergies  Allergen Reactions   Amoxicillin  Hives and Other (See Comments)    Got ceftriaxone  in the past Has patient had a PCN reaction causing immediate rash, facial/tongue/throat swelling, SOB or lightheadedness with hypotension: Yes Has patient had a PCN reaction causing severe rash involving mucus membranes or skin necrosis: No Has patient had a PCN reaction that required hospitalization: No Has patient had a PCN reaction occurring within the last 10 years: Yes If all of the above answers are NO, then may proceed with Cephalosporin use.   Sudafed [Pseudoephedrine Hcl] Shortness Of Breath   Past Medical History:  Diagnosis Date   Acute pyelonephritis 09/27/2015   Chronic migraine    neurologist-  dr ines   Depression 05/30/2017   with emotional eating   Diabetes mellitus  without complication (HCC)    DM type 2   Dysuria    GERD (gastroesophageal reflux disease)    History of acute pyelonephritis    09-27-2015:   11-04-2017  w/ sepsis due to obstruction from kidney stone   History of COVID-19 07/02/2020   mild symptoms x few days all symptoms resolved   History of kidney stones    IC (interstitial cystitis)    Left ureteral stone    OAB (overactive bladder)    Urge urinary incontinence    Vesicoureteral reflux     Current Outpatient Medications:    acetaminophen  (TYLENOL ) 500 MG tablet, Take 1 tablet (500 mg total) by mouth every 6 (six) hours as needed., Disp: 30 tablet, Rfl: 0   albuterol  (VENTOLIN  HFA) 108 (90 Base) MCG/ACT inhaler, Inhale 2 puffs into the lungs every 6 (six) hours as needed for wheezing or shortness of breath., Disp: 6.7 g, Rfl: 1   blood glucose meter kit and supplies, Dispense based on patient and insurance preference. Use up to four times daily as directed. (FOR ICD-10 E10.9, E11.9)., Disp: 1 each, Rfl: 0   busPIRone  (BUSPAR ) 7.5 MG tablet, Take 0.5-1 tablets (3.75-7.5 mg total) by mouth 2 (two) times daily. For anxiety, Disp: 60 tablet, Rfl: 1   cetirizine  (ZYRTEC ) 10 MG chewable tablet, Chew 1 tablet (10 mg total) by mouth daily., Disp: 90 tablet, Rfl: 3   Cholecalciferol (VITAMIN D -3) 25 MCG (1000 UT) CAPS, Take 1,000 Units by mouth daily with lunch., Disp: , Rfl:    empagliflozin  (JARDIANCE )  10 MG TABS tablet, Take 1 tablet (10 mg total) by mouth daily before breakfast. For sugar control., Disp: 90 tablet, Rfl: 0   famotidine  (PEPCID ) 20 MG tablet, Take 1 tablet (20 mg total) by mouth daily. For acid reflux, Disp: 100 tablet, Rfl: 3   fluticasone  (FLOVENT  HFA) 44 MCG/ACT inhaler, Inhale 1 puff into the lungs 2 (two) times daily., Disp: 31.8 each, Rfl: 3   glucose blood test strip, Use as instructed once daily as directed for sugar monitoring E11.9, Disp: 100 each, Rfl: 3   Lancets MISC, UAD to check BGs once daily as directed  E11.9, Disp: 100 each, Rfl: 3   metFORMIN  (GLUCOPHAGE ) 500 MG tablet, Take 1 tablet (500 mg total) by mouth at bedtime., Disp: 90 tablet, Rfl: 3   Meth-Hyo-M Bl-Na Phos-Ph Sal (URIBEL) 118 MG CAPS, Take 1 capsule by mouth daily as needed (for urinary pain/discomfort)., Disp: , Rfl:    OMEGA-3 FATTY ACIDS PO, Take 1,000 mg by mouth daily., Disp: , Rfl:    ondansetron  (ZOFRAN -ODT) 4 MG disintegrating tablet, Take 1 tablet (4 mg total) by mouth every 8 (eight) hours as needed for nausea or vomiting., Disp: 20 tablet, Rfl: 0   propranolol  ER (INDERAL  LA) 120 MG 24 hr capsule, Take 1 capsule (120 mg total) by mouth at bedtime., Disp: 100 capsule, Rfl: 3   rizatriptan  (MAXALT -MLT) 10 MG disintegrating tablet, Take 1 tablet (10 mg total) by mouth 3 (three) times daily as needed for migraine., Disp: 9 tablet, Rfl: 11   rosuvastatin  (CRESTOR ) 5 MG tablet, Take 1 tablet (5 mg total) by mouth at bedtime., Disp: 90 tablet, Rfl: 3   sertraline  (ZOLOFT ) 100 MG tablet, Take 1 tablet (100 mg total) by mouth daily., Disp: 90 tablet, Rfl: 3   tirzepatide  (MOUNJARO ) 2.5 MG/0.5ML Pen, Inject 2.5 mg into the skin once a week. Month #1, Disp: 2 mL, Rfl: 0   tirzepatide  (MOUNJARO ) 5 MG/0.5ML Pen, Inject 5 mg into the skin once a week. Month #2, Disp: 2 mL, Rfl: 0   tirzepatide  (MOUNJARO ) 7.5 MG/0.5ML Pen, Inject 7.5 mg into the skin once a week. Month #3, Disp: 2 mL, Rfl: 1   Vibegron (GEMTESA) 75 MG TABS, Take 75 mg by mouth daily. Per urology, Disp: , Rfl:  Social History   Socioeconomic History   Marital status: Married    Spouse name: Lauren Cardenas   Number of children: 1   Years of education: 12   Highest education level: 12th grade  Occupational History   Occupation: Recruitment consultant spa and tanning  Tobacco Use   Smoking status: Never   Smokeless tobacco: Never  Vaping Use   Vaping status: Never Used  Substance and Sexual Activity   Alcohol use: No   Drug use: No   Sexual activity: Yes    Birth control/protection:  None  Other Topics Concern   Not on file  Social History Narrative   Lives with husband and daughter and in-laws   Daughter, Lauren Cardenas, with nonverbal Autism   Her spouse (who works from home) is very supportive   Caffeine use: 100-150mg /week   She drinks about 3 cups of diet caffeine soda per week   She has been going to [employer]   Social Drivers of Health   Financial Resource Strain: Low Risk  (09/15/2023)   Overall Financial Resource Strain (CARDIA)    Difficulty of Paying Living Expenses: Not very hard  Food Insecurity: No Food Insecurity (09/15/2023)   Hunger Vital  Sign    Worried About Programme researcher, broadcasting/film/video in the Last Year: Never true    Ran Out of Food in the Last Year: Never true  Transportation Needs: No Transportation Needs (09/15/2023)   PRAPARE - Administrator, Civil Service (Medical): No    Lack of Transportation (Non-Medical): No  Physical Activity: Inactive (09/15/2023)   Exercise Vital Sign    Days of Exercise per Week: 0 days    Minutes of Exercise per Session: 20 min  Stress: Stress Concern Present (09/15/2023)   [employer] of Occupational Health - Occupational Stress Questionnaire    Feeling of Stress : To some extent  Social Connections: Socially Integrated (09/15/2023)   Social Connection and Isolation Panel    Frequency of Communication with Friends and Family: More than three times a week    Frequency of Social Gatherings with Friends and Family: More than three times a week    Attends Religious Services: More than 4 times per year    Active Member of [employer] or Organizations: Yes    Attends Banker Meetings: Never    Marital Status: Married  Catering manager Violence: Unknown (10/01/2021)   Received from [facility]   HITS    Physically Hurt: Not on file    Insult or Talk Down To: Not on file    Threaten Physical Harm: Not on file    Scream or Curse: Not on file   Family History  Problem Relation Age  of Onset   Hypertension Mother    Obesity Mother    Diabetes Father    Kidney disease Father    Sudden death Father     Objective: Office vital signs reviewed. BP 123/80   Pulse 74   Temp 98.3 F (36.8 C)   Ht 5' 1 (1.549 m)   Wt 201 lb (91.2 kg)   LMP 09/20/2023   SpO2 93%   BMI 37.98 kg/m   Physical Examination:  General: Awake, alert, morbidly obese, No acute distress HEENT: Sclera white.  Moist mucous membranes Cardio: regular rate and rhythm, S1S2 heard, no murmurs appreciated Pulm: clear to auscultation bilaterally, no wheezes, rhonchi or rales; normal work of breathing on room air   Diabetic Foot Exam - Simple   Simple Foot Form Diabetic Foot exam was performed with the following findings: Yes 12/18/2023  4:23 PM  Visual Inspection No deformities, no ulcerations, no other skin breakdown bilaterally: Yes Sensation Testing Intact to touch and monofilament testing bilaterally: Yes Pulse Check Posterior Tibialis and Dorsalis pulse intact bilaterally: Yes Comments     Assessment/ Plan: 38 y.o. female   Type 2 diabetes mellitus with microalbuminuria (HCC) - Plan: Microalbumin / creatinine urine ratio, Bayer DCA Hb A1c Waived, Basic Metabolic Panel, empagliflozin  (JARDIANCE ) 10 MG TABS tablet, tirzepatide  (MOUNJARO ) 2.5 MG/0.5ML Pen, tirzepatide  (MOUNJARO ) 5 MG/0.5ML Pen, tirzepatide  (MOUNJARO ) 7.5 MG/0.5ML Pen  Long-term current use of injectable noninsulin antidiabetic medication  A1c pending.  Will contact patient with results though anticipate her blood sugars will not be at goal as she has not been on the Jardiance  nor the Mounjaro  since March due to for what ever reason it not being available at the pharmacy.  I have rerouted this to [facility] and expressed importance of obtaining these medications and restarting them immediately.  Her last urine specimen showed quite a bit of microalbumin urea and that was repeated today though I again expect that it will  still be elevated  Norene HERO  Jolinda, DO [facility] [phone]

## 2023-12-19 ENCOUNTER — Ambulatory Visit: Payer: Self-pay | Admitting: Family Medicine

## 2023-12-19 LAB — BASIC METABOLIC PANEL WITH GFR
BUN/Creatinine Ratio: 19 (ref 9–23)
BUN: 14 mg/dL (ref 6–20)
CO2: 23 mmol/L (ref 20–29)
Calcium: 9.4 mg/dL (ref 8.7–10.2)
Chloride: 100 mmol/L (ref 96–106)
Creatinine, Ser: 0.73 mg/dL (ref 0.57–1.00)
Glucose: 241 mg/dL — ABNORMAL HIGH (ref 70–99)
Potassium: 4.7 mmol/L (ref 3.5–5.2)
Sodium: 140 mmol/L (ref 134–144)
eGFR: 107 mL/min/{1.73_m2} (ref >59)

## 2023-12-19 LAB — MICROALBUMIN / CREATININE URINE RATIO
Creatinine, Urine: 177.3 mg/dL
Microalb/Creat Ratio: 438 mg/g{creat} — ABNORMAL HIGH (ref 0–29)
Microalbumin, Urine: 776.4 ug/mL

## 2023-12-19 LAB — BAYER DCA HB A1C WAIVED: HB A1C (BAYER DCA - WAIVED): 9 % — ABNORMAL HIGH (ref 4.8–5.6)

## 2024-01-03 DIAGNOSIS — N200 Calculus of kidney: Secondary | ICD-10-CM | POA: Diagnosis not present

## 2024-01-03 DIAGNOSIS — R350 Frequency of micturition: Secondary | ICD-10-CM | POA: Diagnosis not present

## 2024-01-11 ENCOUNTER — Encounter: Payer: Self-pay | Admitting: Family Medicine

## 2024-01-18 DIAGNOSIS — R300 Dysuria: Secondary | ICD-10-CM | POA: Diagnosis not present

## 2024-01-18 DIAGNOSIS — R1010 Upper abdominal pain, unspecified: Secondary | ICD-10-CM | POA: Diagnosis not present

## 2024-01-29 DIAGNOSIS — N390 Urinary tract infection, site not specified: Secondary | ICD-10-CM | POA: Diagnosis not present

## 2024-01-29 DIAGNOSIS — R300 Dysuria: Secondary | ICD-10-CM | POA: Diagnosis not present

## 2024-04-07 DIAGNOSIS — R1012 Left upper quadrant pain: Secondary | ICD-10-CM | POA: Diagnosis not present

## 2024-04-16 ENCOUNTER — Ambulatory Visit (INDEPENDENT_AMBULATORY_CARE_PROVIDER_SITE_OTHER): Admitting: Family Medicine

## 2024-04-16 ENCOUNTER — Encounter: Payer: Self-pay | Admitting: Family Medicine

## 2024-04-16 VITALS — BP 117/72 | HR 73 | Temp 98.0°F | Ht 61.0 in | Wt 197.5 lb

## 2024-04-16 DIAGNOSIS — Z23 Encounter for immunization: Secondary | ICD-10-CM

## 2024-04-16 DIAGNOSIS — J4530 Mild persistent asthma, uncomplicated: Secondary | ICD-10-CM

## 2024-04-16 DIAGNOSIS — Z7985 Long-term (current) use of injectable non-insulin antidiabetic drugs: Secondary | ICD-10-CM

## 2024-04-16 DIAGNOSIS — Z3049 Encounter for surveillance of other contraceptives: Secondary | ICD-10-CM | POA: Diagnosis not present

## 2024-04-16 DIAGNOSIS — E1169 Type 2 diabetes mellitus with other specified complication: Secondary | ICD-10-CM

## 2024-04-16 DIAGNOSIS — R809 Proteinuria, unspecified: Secondary | ICD-10-CM

## 2024-04-16 DIAGNOSIS — E785 Hyperlipidemia, unspecified: Secondary | ICD-10-CM

## 2024-04-16 DIAGNOSIS — E1129 Type 2 diabetes mellitus with other diabetic kidney complication: Secondary | ICD-10-CM | POA: Diagnosis not present

## 2024-04-16 LAB — BAYER DCA HB A1C WAIVED: HB A1C (BAYER DCA - WAIVED): 7.3 % — ABNORMAL HIGH (ref 4.8–5.6)

## 2024-04-16 MED ORDER — ETONOGESTREL-ETHINYL ESTRADIOL 0.12-0.015 MG/24HR VA RING: VAGINAL_RING | VAGINAL | 4 refills | Status: DC

## 2024-04-16 MED ORDER — TIRZEPATIDE 2.5 MG/0.5ML ~~LOC~~ SOAJ: 2.5000 mg | SUBCUTANEOUS | 0 refills | Status: DC

## 2024-04-16 MED ORDER — TIRZEPATIDE 5 MG/0.5ML ~~LOC~~ SOAJ: 5.0000 mg | SUBCUTANEOUS | 0 refills | Status: DC

## 2024-04-16 NOTE — Progress Notes (Signed)
 Subjective: CC:DM PCP: [name], DO YEP:Mjryjzo D Gawronski is a 38 y.o. 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  She wants to try and go back on it.  ROS: No chest pain, shortness of breath or dizziness.  She has ongoing neuropathy and consider gabapentin  but after further discussion has decided that she does not want to proceed with that medication at this time.  She is utilizing alpha lipoic acid and that does seem to help some with her neuropathy  She reports to me that she also is recently sexually active again with her spouse.  They have reconciled.  She does want a proceed with contraception because she does not desire pregnancy at this time.  Her last menstrual cycle was 1 week ago and was normal.   Diabetes Health Maintenance Due  Topic Date Due   OPHTHALMOLOGY EXAM  03/08/2024   HEMOGLOBIN A1C  06/18/2024   FOOT EXAM  12/17/2024    ROS: Per HPI  Allergies  Allergen Reactions   Amoxicillin  Hives and Other (See Comments)    Got ceftriaxone  in the past Has patient had a PCN reaction causing immediate rash, facial/tongue/throat swelling, SOB or lightheadedness with hypotension: Yes Has patient had a PCN reaction causing severe rash involving mucus membranes or skin necrosis: No Has patient had a PCN reaction that required hospitalization: No Has patient had a PCN reaction occurring within the last 10 years: Yes If all of the above answers are NO, then may proceed with Cephalosporin use.   Sudafed [Pseudoephedrine Hcl] Shortness Of Breath   Past Medical History:  Diagnosis Date   Acute pyelonephritis 09/27/2015   Chronic migraine    neurologist-  dr ines   Depression 05/30/2017   with emotional eating   Diabetes mellitus without complication  (HCC)    DM type 2   Dysuria    GERD (gastroesophageal reflux disease)    History of acute pyelonephritis    09-27-2015:   11-04-2017  w/ sepsis due to obstruction from kidney stone   History of COVID-19 07/02/2020   mild symptoms x few days all symptoms resolved   History of kidney stones    IC (interstitial cystitis)    Left ureteral stone    OAB (overactive bladder)    Urge urinary incontinence    Vesicoureteral reflux     Current Outpatient Medications:    acetaminophen  (TYLENOL ) 500 MG tablet, Take 1 tablet (500 mg total) by mouth every 6 (six) hours as needed., Disp: 30 tablet, Rfl: 0   albuterol  (VENTOLIN  HFA) 108 (90 Base) MCG/ACT inhaler, Inhale 2 puffs into the lungs every 6 (six) hours as needed for wheezing or shortness of breath., Disp: 6.7 g, Rfl: 1   Alpha-Lipoic Acid 600 MG CAPS, Take 600 mg by mouth at bedtime., Disp: , Rfl:    blood glucose meter kit and supplies, Dispense based on patient and insurance preference. Use up to four times daily as directed. (FOR ICD-10 E10.9, E11.9)., Disp: 1 each, Rfl: 0   busPIRone  (BUSPAR ) 7.5 MG tablet, Take 0.5-1 tablets (3.75-7.5 mg total) by mouth 2 (two) times daily. For anxiety, Disp: 60 tablet, Rfl: 1   cetirizine  (ZYRTEC ) 10 MG chewable tablet, Chew 1 tablet (10 mg total) by mouth daily., Disp: 90 tablet, Rfl: 3   Cholecalciferol (  VITAMIN D -3) 25 MCG (1000 UT) CAPS, Take 1,000 Units by mouth daily with lunch., Disp: , Rfl:    empagliflozin  (JARDIANCE ) 10 MG TABS tablet, Take 1 tablet (10 mg total) by mouth daily before breakfast. For sugar control., Disp: 90 tablet, Rfl: 3   famotidine  (PEPCID ) 20 MG tablet, Take 1 tablet (20 mg total) by mouth daily. For acid reflux, Disp: 100 tablet, Rfl: 3   fesoterodine (TOVIAZ) 4 MG TB24 tablet, Take 4 mg by mouth daily., Disp: , Rfl:    fluticasone  (FLOVENT  HFA) 44 MCG/ACT inhaler, Inhale 1 puff into the lungs 2 (two) times daily., Disp: 31.8 each, Rfl: 3   glucose blood test strip, Use as  instructed once daily as directed for sugar monitoring E11.9, Disp: 100 each, Rfl: 3   Lancets MISC, UAD to check BGs once daily as directed E11.9, Disp: 100 each, Rfl: 3   metFORMIN  (GLUCOPHAGE ) 500 MG tablet, Take 1 tablet (500 mg total) by mouth at bedtime., Disp: 90 tablet, Rfl: 3   Meth-Hyo-M Bl-Na Phos-Ph Sal (URIBEL) 118 MG CAPS, Take 1 capsule by mouth daily as needed (for urinary pain/discomfort)., Disp: , Rfl:    ondansetron  (ZOFRAN -ODT) 4 MG disintegrating tablet, Take 1 tablet (4 mg total) by mouth every 8 (eight) hours as needed for nausea or vomiting., Disp: 20 tablet, Rfl: 0   propranolol  ER (INDERAL  LA) 120 MG 24 hr capsule, Take 1 capsule (120 mg total) by mouth at bedtime., Disp: 100 capsule, Rfl: 3   rizatriptan  (MAXALT -MLT) 10 MG disintegrating tablet, Take 1 tablet (10 mg total) by mouth 3 (three) times daily as needed for migraine., Disp: 9 tablet, Rfl: 11   rosuvastatin  (CRESTOR ) 5 MG tablet, Take 1 tablet (5 mg total) by mouth at bedtime., Disp: 90 tablet, Rfl: 3   sertraline  (ZOLOFT ) 100 MG tablet, Take 1 tablet (100 mg total) by mouth daily., Disp: 90 tablet, Rfl: 3   Vibegron (GEMTESA) 75 MG TABS, Take 75 mg by mouth daily. Per urology, Disp: , Rfl:    OMEGA-3 FATTY ACIDS PO, Take 1,000 mg by mouth daily. (Patient not taking: Reported on 04/16/2024), Disp: , Rfl:    tirzepatide  (MOUNJARO ) 2.5 MG/0.5ML Pen, Inject 2.5 mg into the skin once a week. Month #1 (Patient not taking: Reported on 04/16/2024), Disp: 2 mL, Rfl: 0   tirzepatide  (MOUNJARO ) 5 MG/0.5ML Pen, Inject 5 mg into the skin once a week. Month #2 (Patient not taking: Reported on 04/16/2024), Disp: 2 mL, Rfl: 0   tirzepatide  (MOUNJARO ) 7.5 MG/0.5ML Pen, Inject 7.5 mg into the skin once a week. Month #3 (Patient not taking: Reported on 04/16/2024), Disp: 2 mL, Rfl: 1 Social History   Socioeconomic History   Marital status: Married    Spouse name: Fairy   Number of children: 1   Years of education: 12   Highest  education level: 12th grade  Occupational History   Occupation: Recruitment consultant spa and tanning  Tobacco Use   Smoking status: Never   Smokeless tobacco: Never  Vaping Use   Vaping status: Never Used  Substance and Sexual Activity   Alcohol use: No   Drug use: No   Sexual activity: Yes    Birth control/protection: None  Other Topics Concern   Not on file  Social History Narrative   Lives with husband and daughter and in-laws   Daughter, Summer, with nonverbal Autism   Her spouse (who works from home) is very supportive   Caffeine use: 100-150mg /week   She drinks  about 3 cups of diet caffeine soda per week   She has been going to [employer]   Social Drivers of Health   Financial Resource Strain: Low Risk  (04/15/2024)   Overall Financial Resource Strain (CARDIA)    Difficulty of Paying Living Expenses: Not very hard  Food Insecurity: No Food Insecurity (04/15/2024)   Hunger Vital Sign    Worried About Running Out of Food in the Last Year: Never true    Ran Out of Food in the Last Year: Never true  Transportation Needs: No Transportation Needs (04/15/2024)   PRAPARE - Administrator, Civil Service (Medical): No    Lack of Transportation (Non-Medical): No  Physical Activity: Sufficiently Active (04/15/2024)   Exercise Vital Sign    Days of Exercise per Week: 5 days    Minutes of Exercise per Session: 40 min  Stress: No Stress Concern Present (04/15/2024)   [employer] of Occupational Health - Occupational Stress Questionnaire    Feeling of Stress: Only a little  Social Connections: Socially Integrated (04/15/2024)   Social Connection and Isolation Panel    Frequency of Communication with Friends and Family: More than three times a week    Frequency of Social Gatherings with Friends and Family: Twice a week    Attends Religious Services: More than 4 times per year    Active Member of [employer] or Organizations: Yes    Attends Tax inspector Meetings: More than 4 times per year    Marital Status: Married  Catering manager Violence: Unknown (10/01/2021)   Received from [facility]   HITS    Physically Hurt: Not on file    Insult or Talk Down To: Not on file    Threaten Physical Harm: Not on file    Scream or Curse: Not on file   Family History  Problem Relation Age of Onset   Hypertension Mother    Obesity Mother    Diabetes Father    Kidney disease Father    Sudden death Father     Objective: Office vital signs reviewed. BP 117/72   Pulse 73   Temp 98 F (36.7 C)   Ht 5' 1 (1.549 m)   Wt 197 lb 8 oz (89.6 kg)   SpO2 93%   BMI 37.32 kg/m   Physical Examination:  General: Awake, alert, well nourished, No acute distress HEENT: sclera white, MMM.  Open and close comedones on the cheeks noted Cardio: regular rate and rhythm, S1S2 heard, no murmurs appreciated Pulm: clear to auscultation bilaterally, no wheezes, rhonchi or rales; normal work of breathing on room air   Lab Results  Component Value Date   HGBA1C 9.0 (H) 12/18/2023    Assessment/ Plan: 38 y.o. 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  It was 7.3 today.  I have given her 9-month supplies of both 2.5 mg and 5 mg and she can stay on each as long as she  needs to without titration  Continue statin  Asthma is  controlled  NuvaRing prescribed.  Discussed risks of use of oral contraceptive pills in the setting of Mounjaro  use and I think the risk for pregnancy is too high with an oral.  Influenza vaccination administered   Norene CHRISTELLA Fielding, DO [facility] [phone]

## 2024-04-18 LAB — PREGNANCY, URINE: Preg Test, Ur: NEGATIVE

## 2024-05-25 ENCOUNTER — Other Ambulatory Visit: Payer: Self-pay | Admitting: Family Medicine

## 2024-05-25 DIAGNOSIS — F411 Generalized anxiety disorder: Secondary | ICD-10-CM

## 2024-06-07 LAB — OPHTHALMOLOGY REPORT-SCANNED

## 2024-06-11 DIAGNOSIS — H5213 Myopia, bilateral: Secondary | ICD-10-CM | POA: Diagnosis not present

## 2024-06-14 ENCOUNTER — Other Ambulatory Visit: Payer: Self-pay | Admitting: Family Medicine

## 2024-06-14 DIAGNOSIS — E1129 Type 2 diabetes mellitus with other diabetic kidney complication: Secondary | ICD-10-CM

## 2024-06-22 DIAGNOSIS — R070 Pain in throat: Secondary | ICD-10-CM | POA: Diagnosis not present

## 2024-06-22 DIAGNOSIS — J019 Acute sinusitis, unspecified: Secondary | ICD-10-CM | POA: Diagnosis not present

## 2024-06-22 DIAGNOSIS — R0981 Nasal congestion: Secondary | ICD-10-CM | POA: Diagnosis not present

## 2024-07-23 ENCOUNTER — Encounter: Payer: Self-pay | Admitting: Family Medicine

## 2024-07-25 ENCOUNTER — Other Ambulatory Visit (HOSPITAL_COMMUNITY)
Admission: RE | Admit: 2024-07-25 | Discharge: 2024-07-25 | Disposition: A | Source: Ambulatory Visit | Attending: Family Medicine | Admitting: Family Medicine

## 2024-07-25 ENCOUNTER — Ambulatory Visit (INDEPENDENT_AMBULATORY_CARE_PROVIDER_SITE_OTHER): Admitting: Family Medicine

## 2024-07-25 ENCOUNTER — Encounter: Payer: Self-pay | Admitting: Family Medicine

## 2024-07-25 VITALS — BP 126/82 | HR 75 | Temp 98.3°F | Ht 61.0 in | Wt 174.2 lb

## 2024-07-25 DIAGNOSIS — F331 Major depressive disorder, recurrent, moderate: Secondary | ICD-10-CM | POA: Diagnosis not present

## 2024-07-25 DIAGNOSIS — Z124 Encounter for screening for malignant neoplasm of cervix: Secondary | ICD-10-CM | POA: Insufficient documentation

## 2024-07-25 DIAGNOSIS — J4530 Mild persistent asthma, uncomplicated: Secondary | ICD-10-CM | POA: Diagnosis not present

## 2024-07-25 DIAGNOSIS — K219 Gastro-esophageal reflux disease without esophagitis: Secondary | ICD-10-CM | POA: Diagnosis not present

## 2024-07-25 DIAGNOSIS — Z0001 Encounter for general adult medical examination with abnormal findings: Secondary | ICD-10-CM

## 2024-07-25 DIAGNOSIS — E66811 Obesity, class 1: Secondary | ICD-10-CM

## 2024-07-25 DIAGNOSIS — Z7985 Long-term (current) use of injectable non-insulin antidiabetic drugs: Secondary | ICD-10-CM | POA: Diagnosis not present

## 2024-07-25 DIAGNOSIS — E1129 Type 2 diabetes mellitus with other diabetic kidney complication: Secondary | ICD-10-CM

## 2024-07-25 DIAGNOSIS — F411 Generalized anxiety disorder: Secondary | ICD-10-CM | POA: Diagnosis not present

## 2024-07-25 DIAGNOSIS — E1169 Type 2 diabetes mellitus with other specified complication: Secondary | ICD-10-CM

## 2024-07-25 DIAGNOSIS — G43009 Migraine without aura, not intractable, without status migrainosus: Secondary | ICD-10-CM

## 2024-07-25 DIAGNOSIS — Z3044 Encounter for surveillance of vaginal ring hormonal contraceptive device: Secondary | ICD-10-CM

## 2024-07-25 DIAGNOSIS — R809 Proteinuria, unspecified: Secondary | ICD-10-CM

## 2024-07-25 DIAGNOSIS — E559 Vitamin D deficiency, unspecified: Secondary | ICD-10-CM | POA: Diagnosis not present

## 2024-07-25 DIAGNOSIS — E785 Hyperlipidemia, unspecified: Secondary | ICD-10-CM

## 2024-07-25 DIAGNOSIS — Z0000 Encounter for general adult medical examination without abnormal findings: Secondary | ICD-10-CM

## 2024-07-25 LAB — BAYER DCA HB A1C WAIVED: HB A1C (BAYER DCA - WAIVED): 6.3 % — ABNORMAL HIGH (ref 4.8–5.6)

## 2024-07-25 MED ORDER — ROSUVASTATIN CALCIUM 5 MG PO TABS: 5.0000 mg | ORAL_TABLET | Freq: Every day | ORAL | 3 refills | Status: AC

## 2024-07-25 MED ORDER — TIRZEPATIDE 7.5 MG/0.5ML ~~LOC~~ SOAJ: 7.5000 mg | SUBCUTANEOUS | 3 refills | Status: AC

## 2024-07-25 MED ORDER — SERTRALINE HCL 100 MG PO TABS: 100.0000 mg | ORAL_TABLET | Freq: Every day | ORAL | 3 refills | Status: AC

## 2024-07-25 MED ORDER — FAMOTIDINE 20 MG PO TABS: 20.0000 mg | ORAL_TABLET | Freq: Every day | ORAL | 3 refills | Status: AC

## 2024-07-25 MED ORDER — EMPAGLIFLOZIN 10 MG PO TABS: 10.0000 mg | ORAL_TABLET | Freq: Every day | ORAL | 3 refills | Status: AC

## 2024-07-25 MED ORDER — PROPRANOLOL HCL ER 120 MG PO CP24: 120.0000 mg | ORAL_CAPSULE | Freq: Every day | ORAL | 3 refills | Status: AC

## 2024-07-25 MED ORDER — ETONOGESTREL-ETHINYL ESTRADIOL 0.12-0.015 MG/24HR VA RING: VAGINAL_RING | VAGINAL | 4 refills | Status: AC

## 2024-07-25 MED ORDER — FLUTICASONE PROPIONATE HFA 44 MCG/ACT IN AERO: 1.0000 | INHALATION_SPRAY | Freq: Two times a day (BID) | RESPIRATORY_TRACT | 3 refills | Status: AC

## 2024-07-25 NOTE — Progress Notes (Signed)
 "  Lauren Cardenas is a 39 y.o. female presents to office today for annual physical exam examination.    Patient reports that she is doing well.  She is down over 20 pounds with Mounjaro .  Up to 5 mg and not having any issues with nausea, vomiting or GI issues.  She did have some leftover phentermine  has been utilizing that for ongoing appetite suppression at a half dose.  Wondering if this is something she should continue as she has not noticed total appetite suppression with her Mounjaro  alone.  Amenable to going up in the dose however.  She is getting adequate protein.  She admits that she may not be drinking enough water.  No hypoglycemic episodes to her knowledge.  Denies any concerning GI side effects etc. Marital status: Married, Substance use: Very rare EtOH Health Maintenance Due  Topic Date Due   COVID-19 Vaccine (1 - 2025-26 season) Never done   Cervical Cancer Screening (HPV/Pap Cotest)  05/05/2024   Mammogram  Never done    Immunization History  Administered Date(s) Administered   DTaP 03/15/1989, 03/12/1990   Hepatitis B 04/04/1996, 05/13/1996, 10/15/1996   IPV 03/15/1989, 03/12/1990   Influenza Split 06/14/2012   Influenza, Seasonal, Injecte, Preservative Fre 06/06/2023, 04/16/2024   Influenza,inj,Quad PF,6+ Mos 08/17/2017, 04/04/2018, 05/06/2019, 04/22/2022   MMR 03/12/1990   PNEUMOCOCCAL CONJUGATE-20 12/01/2021   Tdap 06/16/2012   Past Medical History:  Diagnosis Date   Acute pyelonephritis 09/27/2015   Chronic migraine    neurologist-  dr ines   Depression 05/30/2017   with emotional eating   Diabetes mellitus without complication (HCC)    DM type 2   Dysuria    GERD (gastroesophageal reflux disease)    History of acute pyelonephritis    09-27-2015:   11-04-2017  w/ sepsis due to obstruction from kidney stone   History of COVID-19 07/02/2020   mild symptoms x few days all symptoms resolved   History of kidney stones    IC (interstitial cystitis)    Left  ureteral stone    OAB (overactive bladder)    Urge urinary incontinence    Vesicoureteral reflux    Social History   Socioeconomic History   Marital status: Married    Spouse name: Fairy   Number of children: 1   Years of education: 12   Highest education level: 12th grade  Occupational History   Occupation: Recruitment Consultant [employer]  Tobacco Use   Smoking status: Never   Smokeless tobacco: Never  Vaping Use   Vaping status: Never Used  Substance and Sexual Activity   Alcohol use: No   Drug use: No   Sexual activity: Yes    Birth control/protection: None  Other Topics Concern   Not on file  Social History Narrative   Lives with husband and daughter and in-laws   Daughter, Summer, with nonverbal Autism   Her spouse (who works from home) is very supportive   Caffeine use: 100-150mg /week   She drinks about 3 cups of diet caffeine soda per week   She has been going to [employer]   Social Drivers of Health   Tobacco Use: Low Risk (06/30/2024)   Received from [facility]   Patient History    Smoking Tobacco Use: Never    Smokeless Tobacco Use: Never    Passive Exposure: Not on file  Recent Concern: Tobacco Use - Medium Risk (06/22/2024)   Received from [facility]   Patient History  Smoking Tobacco Use: Never    Smokeless Tobacco Use: Never    Passive Exposure: Current  Financial Resource Strain: Low Risk (04/15/2024)   Overall Financial Resource Strain (CARDIA)    Difficulty of Paying Living Expenses: Not very hard  Food Insecurity: No Food Insecurity (04/15/2024)   Epic    Worried About Programme Researcher, Broadcasting/film/video in the Last Year: Never true    Ran Out of Food in the Last Year: Never true  Transportation Needs: No Transportation Needs (04/15/2024)   Epic    Lack of Transportation (Medical): No    Lack of Transportation (Non-Medical): No  Physical Activity: Sufficiently Active (04/15/2024)   Exercise Vital Sign    Days of Exercise per  Week: 5 days    Minutes of Exercise per Session: 40 min  Stress: No Stress Concern Present (04/15/2024)   [employer] of Occupational Health - Occupational Stress Questionnaire    Feeling of Stress: Only a little  Social Connections: Socially Integrated (04/15/2024)   Social Connection and Isolation Panel    Frequency of Communication with Friends and Family: More than three times a week    Frequency of Social Gatherings with Friends and Family: Twice a week    Attends Religious Services: More than 4 times per year    Active Member of Clubs or Organizations: Yes    Attends Banker Meetings: More than 4 times per year    Marital Status: Married  Catering Manager Violence: Not on file  Depression (PHQ2-9): Medium Risk (04/16/2024)   Depression (PHQ2-9)    PHQ-2 Score: 6  Alcohol Screen: Low Risk (04/15/2024)   Alcohol Screen    Last Alcohol Screening Score (AUDIT): 1  Housing: Low Risk (04/15/2024)   Epic    Unable to Pay for Housing in the Last Year: No    Number of Times Moved in the Last Year: 1    Homeless in the Last Year: No  Utilities: Not on file  Health Literacy: Not on file   Past Surgical History:  Procedure Laterality Date   CHOLECYSTECTOMY N/A 10/26/2017   Procedure: LAPAROSCOPIC CHOLECYSTECTOMY WITH INTRAOPERATIVE CHOLANGIOGRAM ERAS PATHWAY;  Surgeon: Vanderbilt Ned, MD;  Location: [facility];  Service: General;  Laterality: N/A;   CYSTOSCOPY W/ URETERAL STENT PLACEMENT Right 09/27/2015   Procedure: CYSTOSCOPY WITH RETROGRADE PYELOGRAM/URETERAL STENT PLACEMENT;  Surgeon: Redell Lynwood Napoleon, MD;  Location: [facility];  Service: Urology;  Laterality: Right;   CYSTOSCOPY WITH RETROGRADE PYELOGRAM, URETEROSCOPY AND STENT PLACEMENT Left 09/09/2014   Procedure: CYSTOSCOPY WITH LEFT RETROGRADE PYELOGRAM, URETEROSCOPY AND STONE EXTRACTION  DOUBLE J STENT PLACEMENT;  Surgeon: Donnice Brooks, MD;  Location: [facility];  Service: Urology;  Laterality: Left;   CYSTOSCOPY WITH  STENT PLACEMENT Left 11/04/2017   Procedure: CYSTOSCOPY WITH RETROGRADE AND LEFT STENT PLACEMENT;  Surgeon: Carolee Sherwood JONETTA DOUGLAS, MD;  Location: [facility];  Service: Urology;  Laterality: Left;   CYSTOSCOPY/URETEROSCOPY/HOLMIUM LASER/STENT PLACEMENT Right 10/16/2015   Procedure: RIGHT URETEROSCOPY/HOLMIUM LASER/STENT PLACEMENT;  Surgeon: Donnice Brooks, MD;  Location[facility];  Service: Urology;  Laterality: Right;   CYSTOSCOPY/URETEROSCOPY/HOLMIUM LASER/STENT PLACEMENT Left 11/21/2017   Procedure: CYSTOSCOPY/URETEROSCOPY/HOLMIUM LASER/STENT PLACEMENT;  Surgeon: Brooks Donnice, MD;  Location: [facility];  Service: Urology;  Laterality: Left;  ONLY NEEDS 60 MIN   CYSTOSCOPY/URETEROSCOPY/HOLMIUM LASER/STENT PLACEMENT Left 04/20/2021   Procedure: CYSTOSCOPY/RETROGRADE/URETEROSCOPY/BASKET STONE EXTRACTION/STENT PLACEMENT;  Surgeon: Brooks Donnice, MD;  [facility];  Service: Urology;  Laterality: Left;   HOLMIUM LASER APPLICATION  Left 09/09/2014   Procedure: HOLMIUM LASER APPLICATION;  Surgeon: Donnice Brooks, MD;  Location: [facility];  Service: Urology;  Laterality: Left;   HOLMIUM LASER APPLICATION Right 10/16/2015   Procedure: HOLMIUM LASER APPLICATION;  Surgeon: Donnice Brooks, MD;  Location: [facility];  Service: Urology;  Laterality: Right;   STONE EXTRACTION WITH BASKET Right 10/16/2015   Procedure: STONE EXTRACTION WITH BASKET;  Surgeon: Donnice Brooks, MD;  Location[facility];  Service: Urology;  Laterality: Right;   WISDOM TOOTH EXTRACTION  03/2014   Family History  Problem Relation Age of Onset   Hypertension Mother    Obesity Mother    Diabetes Father    Kidney disease Father    Sudden death Father    Current Medications[1]  Allergies[2]   ROS: Review of Systems Pertinent items noted in HPI and remainder of comprehensive ROS otherwise negative.    Physical exam BP 126/82   Pulse 75    Temp 98.3 F (36.8 C) (Temporal)   Ht 5' 1 (1.549 m)   Wt 174 lb 3.2 oz (79 kg)   LMP 06/28/2024 (Exact Date)   SpO2 96%   BMI 32.91 kg/m  General appearance: alert, cooperative, appears stated age, no distress, and moderately obese Head: Normocephalic, without obvious abnormality, atraumatic Eyes: negative findings: lids and lashes normal, conjunctivae and sclerae normal, corneas clear, and pupils equal, round, reactive to light and accomodation Ears: normal TM's and external ear canals both ears Nose: Nares normal. Septum midline. Mucosa normal. No drainage or sinus tenderness. Throat: lips, mucosa, and tongue normal; teeth and gums normal Neck: no adenopathy, no carotid bruit, supple, symmetrical, trachea midline, and thyroid  not enlarged, symmetric, no tenderness/mass/nodules Back: symmetric, no curvature. ROM normal. No CVA tenderness. Lungs: clear to auscultation bilaterally Heart: regular rate and rhythm, S1, S2 normal, no murmur, click, rub or gallop Abdomen: soft, non-tender; bowel sounds normal; no masses,  no organomegaly Pelvic: cervix normal in appearance, external genitalia normal, no adnexal masses or tenderness, no cervical motion tenderness, rectovaginal septum normal, uterus normal size, shape, and consistency, and scant leukorrhea Extremities: extremities normal, atraumatic, no cyanosis or edema Pulses: 2+ and symmetric Skin: Has a papular lesion at the anterior aspect of her scalp on the right Lymph nodes: No supraclavicular or anterior cervical lymphadenopathy Neurologic: Grossly normal      04/16/2024    4:09 PM 12/18/2023    4:22 PM 12/18/2023    3:58 PM  Depression screen PHQ 2/9  Decreased Interest 1 1 0  Down, Depressed, Hopeless 1 1 0  PHQ - 2 Score 2 2 0  Altered sleeping 1 2 0  Tired, decreased energy 1 1 0  Change in appetite 1 2 0  Feeling bad or failure about yourself  0 0 0  Trouble concentrating 1 1 0  Moving slowly or fidgety/restless 0 0 0   Suicidal thoughts 0 0 0  PHQ-9 Score 6  8  0   Difficult doing work/chores Not difficult at all Somewhat difficult Not difficult at all     Data saved with a previous flowsheet row definition      04/16/2024    4:09 PM 12/18/2023    4:21 PM 12/18/2023    3:58 PM 09/15/2023    2:03 PM  GAD 7 : Generalized Anxiety Score  Nervous, Anxious, on Edge 1  2  0  1   Control/stop worrying 0  2  0  1   Worry too much -  different things 0  1  0  0   Trouble relaxing 0  2  0  1   Restless 1  2  0  1   Easily annoyed or irritable 0  1  0  0   Afraid - awful might happen 0  2  0  0   Total GAD 7 Score 2 12 0 4  Anxiety Difficulty Not difficult at all Somewhat difficult Not difficult at all Not difficult at all     Data saved with a previous flowsheet row definition    Recent Results (from the past 2160 hours)  OPHTHALMOLOGY REPORT-SCANNED     Status: None   Collection Time: 06/07/24  4:34 PM  Result Value Ref Range   HM Diabetic Eye Exam No Retinopathy No Retinopathy    Comment: Abstracted by HIM   A Comment     Lab Results  Component Value Date   HGBA1C 7.3 (H) 04/16/2024    Assessment/ Plan: Lauren Cardenas here for annual physical exam.   Annual physical exam  Type 2 diabetes mellitus with microalbuminuria (HCC) - Plan: Bayer DCA Hb A1c Waived, CMP14+EGFR, CBC with Differential, empagliflozin  (JARDIANCE ) 10 MG TABS tablet, tirzepatide  (MOUNJARO ) 7.5 MG/0.5ML Pen  Long-term current use of injectable noninsulin antidiabetic medication - Plan: CMP14+EGFR, tirzepatide  (MOUNJARO ) 7.5 MG/0.5ML Pen  Hyperlipidemia associated with type 2 diabetes mellitus (HCC) - Plan: CMP14+EGFR, Lipid Panel, TSH, rosuvastatin  (CRESTOR ) 5 MG tablet, tirzepatide  (MOUNJARO ) 7.5 MG/0.5ML Pen  Obesity (BMI 30.0-34.9) - Plan: CMP14+EGFR, tirzepatide  (MOUNJARO ) 7.5 MG/0.5ML Pen  Vitamin D  deficiency - Plan: CMP14+EGFR, VITAMIN D  25 Hydroxy (Vit-D Deficiency, Fractures)  Screening for malignant neoplasm  of cervix - Plan: Cytology - PAP  Surveillance of vaginal ring hormonal contraceptive performed - Plan: etonogestrel -ethinyl estradiol  (NUVARING) 0.12-0.015 MG/24HR vaginal ring  Migraine without aura and without status migrainosus, not intractable - Plan: CMP14+EGFR, propranolol  ER (INDERAL  LA) 120 MG 24 hr capsule  Generalized anxiety disorder - Plan: CMP14+EGFR, sertraline  (ZOLOFT ) 100 MG tablet  Moderate episode of recurrent major depressive disorder (HCC) - Plan: CMP14+EGFR, sertraline  (ZOLOFT ) 100 MG tablet  GERD without esophagitis - Plan: famotidine  (PEPCID ) 20 MG tablet  Mild persistent extrinsic asthma without complication - Plan: fluticasone  (FLOVENT  HFA) 44 MCG/ACT inhaler   Sugar under excellent control now at 6.3.  We will advance her Mounjaro  to 7.5 mg for ongoing appetite suppression and to try and achieve clinical goals related to BMI.  She is now out of morbid obesity range  We did her Pap smear today.  She will schedule mammogram.  NuvaRing renewed  Nonfasting labs collected  Mood is stable with current medications and these have been renewed  Migraines are chronic and stable and medications have been renewed    Counseled on healthy lifestyle choices, including diet (rich in fruits, vegetables and lean meats and low in salt and simple carbohydrates) and exercise (at least 30 minutes of moderate physical activity daily).  Patient to follow up 3-38m for med adjustmnet if needed  Chareese Sergent M. Jode Lippe, DO        [1]  Current Outpatient Medications:    acetaminophen  (TYLENOL ) 500 MG tablet, Take 1 tablet (500 mg total) by mouth every 6 (six) hours as needed., Disp: 30 tablet, Rfl: 0   albuterol  (VENTOLIN  HFA) 108 (90 Base) MCG/ACT inhaler, Inhale 2 puffs into the lungs every 6 (six) hours as needed for wheezing or shortness of breath., Disp: 6.7 g, Rfl: 1   Alpha-Lipoic Acid 600 MG  CAPS, Take 600 mg by mouth at bedtime., Disp: , Rfl:    blood glucose meter kit and  supplies, Dispense based on patient and insurance preference. Use up to four times daily as directed. (FOR ICD-10 E10.9, E11.9)., Disp: 1 each, Rfl: 0   busPIRone  (BUSPAR ) 7.5 MG tablet, TAKE 1/2 TO 1 (ONE-HALF TO ONE) TABLET BY MOUTH TWICE DAILY FOR ANXIETY, Disp: 60 tablet, Rfl: 1   cetirizine  (ZYRTEC ) 10 MG chewable tablet, Chew 1 tablet (10 mg total) by mouth daily., Disp: 90 tablet, Rfl: 3   Cholecalciferol (VITAMIN D -3) 25 MCG (1000 UT) CAPS, Take 1,000 Units by mouth daily with lunch., Disp: , Rfl:    empagliflozin  (JARDIANCE ) 10 MG TABS tablet, Take 1 tablet (10 mg total) by mouth daily before breakfast. For sugar control., Disp: 90 tablet, Rfl: 3   etonogestrel -ethinyl estradiol  (NUVARING) 0.12-0.015 MG/24HR vaginal ring, Insert vaginally and leave in place for 3 consecutive weeks, then remove for 1 week., Disp: 3 each, Rfl: 4   famotidine  (PEPCID ) 20 MG tablet, Take 1 tablet (20 mg total) by mouth daily. For acid reflux, Disp: 100 tablet, Rfl: 3   fesoterodine (TOVIAZ) 4 MG TB24 tablet, Take 4 mg by mouth daily., Disp: , Rfl:    fluticasone  (FLOVENT  HFA) 44 MCG/ACT inhaler, Inhale 1 puff into the lungs 2 (two) times daily., Disp: 31.8 each, Rfl: 3   glucose blood test strip, Use as instructed once daily as directed for sugar monitoring E11.9, Disp: 100 each, Rfl: 3   Lancets MISC, UAD to check BGs once daily as directed E11.9, Disp: 100 each, Rfl: 3   metFORMIN  (GLUCOPHAGE ) 500 MG tablet, Take 1 tablet (500 mg total) by mouth at bedtime., Disp: 90 tablet, Rfl: 3   Meth-Hyo-M Bl-Na Phos-Ph Sal (URIBEL) 118 MG CAPS, Take 1 capsule by mouth daily as needed (for urinary pain/discomfort)., Disp: , Rfl:    ondansetron  (ZOFRAN -ODT) 4 MG disintegrating tablet, DISSOLVE 1 TABLET IN MOUTH EVERY 8 HOURS AS NEEDED FOR NAUSEA FOR VOMITING, Disp: 20 tablet, Rfl: 0   propranolol  ER (INDERAL  LA) 120 MG 24 hr capsule, Take 1 capsule (120 mg total) by mouth at bedtime., Disp: 100 capsule, Rfl: 3   rizatriptan   (MAXALT -MLT) 10 MG disintegrating tablet, Take 1 tablet (10 mg total) by mouth 3 (three) times daily as needed for migraine., Disp: 9 tablet, Rfl: 11   rosuvastatin  (CRESTOR ) 5 MG tablet, Take 1 tablet (5 mg total) by mouth at bedtime., Disp: 90 tablet, Rfl: 3   sertraline  (ZOLOFT ) 100 MG tablet, Take 1 tablet (100 mg total) by mouth daily., Disp: 90 tablet, Rfl: 3   tirzepatide  (MOUNJARO ) 2.5 MG/0.5ML Pen, Inject 2.5 mg into the skin once a week. Month #1, Disp: 6 mL, Rfl: 0   tirzepatide  (MOUNJARO ) 5 MG/0.5ML Pen, Inject 5 mg into the skin once a week. Month #2, Disp: 6 mL, Rfl: 0   Vibegron (GEMTESA) 75 MG TABS, Take 75 mg by mouth daily. Per urology, Disp: , Rfl:  [2]  Allergies Allergen Reactions   Amoxicillin  Hives and Other (See Comments)    Got ceftriaxone  in the past Has patient had a PCN reaction causing immediate rash, facial/tongue/throat swelling, SOB or lightheadedness with hypotension: Yes Has patient had a PCN reaction causing severe rash involving mucus membranes or skin necrosis: No Has patient had a PCN reaction that required hospitalization: No Has patient had a PCN reaction occurring within the last 10 years: Yes If all of the above answers are NO, then  may proceed with Cephalosporin use.   Sudafed [Pseudoephedrine Hcl] Shortness Of Breath   "

## 2024-07-26 ENCOUNTER — Ambulatory Visit: Payer: Self-pay | Admitting: Family Medicine

## 2024-07-26 LAB — CBC WITH DIFFERENTIAL/PLATELET
Basophils Absolute: 0.1 10*3/uL (ref 0.0–0.2)
Basos: 1 %
EOS (ABSOLUTE): 0.7 10*3/uL — ABNORMAL HIGH (ref 0.0–0.4)
Eos: 6 %
Hematocrit: 44.1 % (ref 34.0–46.6)
Hemoglobin: 13.6 g/dL (ref 11.1–15.9)
Immature Grans (Abs): 0 10*3/uL (ref 0.0–0.1)
Immature Granulocytes: 0 %
Lymphocytes Absolute: 3 10*3/uL (ref 0.7–3.1)
Lymphs: 26 %
MCH: 25.2 pg — ABNORMAL LOW (ref 26.6–33.0)
MCHC: 30.8 g/dL — ABNORMAL LOW (ref 31.5–35.7)
MCV: 82 fL (ref 79–97)
Monocytes Absolute: 0.7 10*3/uL (ref 0.1–0.9)
Monocytes: 6 %
Neutrophils Absolute: 7.1 10*3/uL — ABNORMAL HIGH (ref 1.4–7.0)
Neutrophils: 61 %
Platelets: 307 10*3/uL (ref 150–450)
RBC: 5.4 x10E6/uL — ABNORMAL HIGH (ref 3.77–5.28)
RDW: 14.3 % (ref 11.7–15.4)
WBC: 11.6 10*3/uL — ABNORMAL HIGH (ref 3.4–10.8)

## 2024-07-26 LAB — CMP14+EGFR
ALT: 13 [IU]/L (ref 0–32)
AST: 13 [IU]/L (ref 0–40)
Albumin: 4.3 g/dL (ref 3.9–4.9)
Alkaline Phosphatase: 64 [IU]/L (ref 41–116)
BUN/Creatinine Ratio: 10 (ref 9–23)
BUN: 9 mg/dL (ref 6–24)
Bilirubin Total: <0.2 mg/dL (ref 0.0–1.2)
CO2: 23 mmol/L (ref 20–29)
Calcium: 9.8 mg/dL (ref 8.7–10.2)
Chloride: 105 mmol/L (ref 96–106)
Creatinine, Ser: 0.86 mg/dL (ref 0.57–1.00)
Globulin, Total: 2.9 g/dL (ref 1.5–4.5)
Glucose: 79 mg/dL (ref 70–99)
Potassium: 4.8 mmol/L (ref 3.5–5.2)
Sodium: 142 mmol/L (ref 134–144)
Total Protein: 7.2 g/dL (ref 6.0–8.5)
eGFR: 88 mL/min/{1.73_m2}

## 2024-07-26 LAB — LIPID PANEL
Chol/HDL Ratio: 2.5 ratio (ref 0.0–4.4)
Cholesterol, Total: 136 mg/dL (ref 100–199)
HDL: 54 mg/dL
LDL Chol Calc (NIH): 40 mg/dL (ref 0–99)
Triglycerides: 275 mg/dL — ABNORMAL HIGH (ref 0–149)
VLDL Cholesterol Cal: 42 mg/dL — ABNORMAL HIGH (ref 5–40)

## 2024-07-26 LAB — TSH: TSH: 2.45 u[IU]/mL (ref 0.450–4.500)

## 2024-07-26 LAB — VITAMIN D 25 HYDROXY (VIT D DEFICIENCY, FRACTURES): Vit D, 25-Hydroxy: 41.3 ng/mL (ref 30.0–100.0)

## 2024-07-29 LAB — CYTOLOGY - PAP
Comment: NEGATIVE
Diagnosis: NEGATIVE
High risk HPV: NEGATIVE

## 2024-10-29 ENCOUNTER — Ambulatory Visit: Admitting: Family Medicine

## 2025-07-28 ENCOUNTER — Encounter: Admitting: Family Medicine
